# Patient Record
Sex: Male | Born: 1955 | Race: White | Hispanic: No | Marital: Married | Smoking: Former smoker
Health system: Southern US, Community
[De-identification: ages and names within clinical notes are randomized; demographics above are authoritative.]

## PROBLEM LIST (undated history)

## (undated) DIAGNOSIS — Z87442 Personal history of urinary calculi: Secondary | ICD-10-CM

## (undated) DIAGNOSIS — R011 Cardiac murmur, unspecified: Secondary | ICD-10-CM

## (undated) DIAGNOSIS — N189 Chronic kidney disease, unspecified: Secondary | ICD-10-CM

## (undated) DIAGNOSIS — G600 Hereditary motor and sensory neuropathy: Secondary | ICD-10-CM

## (undated) DIAGNOSIS — I714 Abdominal aortic aneurysm, without rupture, unspecified: Secondary | ICD-10-CM

## (undated) DIAGNOSIS — R195 Other fecal abnormalities: Secondary | ICD-10-CM

## (undated) DIAGNOSIS — R0600 Dyspnea, unspecified: Secondary | ICD-10-CM

## (undated) DIAGNOSIS — M1990 Unspecified osteoarthritis, unspecified site: Secondary | ICD-10-CM

## (undated) DIAGNOSIS — J189 Pneumonia, unspecified organism: Secondary | ICD-10-CM

## (undated) DIAGNOSIS — I10 Essential (primary) hypertension: Secondary | ICD-10-CM

## (undated) DIAGNOSIS — C44212 Basal cell carcinoma of skin of right ear and external auricular canal: Secondary | ICD-10-CM

## (undated) DIAGNOSIS — F419 Anxiety disorder, unspecified: Secondary | ICD-10-CM

## (undated) DIAGNOSIS — G629 Polyneuropathy, unspecified: Secondary | ICD-10-CM

## (undated) HISTORY — DX: Polyneuropathy, unspecified: G62.9

## (undated) HISTORY — DX: Hereditary motor and sensory neuropathy: G60.0

## (undated) HISTORY — PX: OTHER SURGICAL HISTORY: SHX169

## (undated) HISTORY — PX: TONSILLECTOMY: SUR1361

## (undated) HISTORY — DX: Other fecal abnormalities: R19.5

## (undated) HISTORY — PX: MOHS SURGERY: SUR867

## (undated) HISTORY — PX: LUNG BIOPSY: SHX232

## (undated) HISTORY — DX: Basal cell carcinoma of skin of right ear and external auricular canal: C44.212

---

## 2005-05-28 ENCOUNTER — Encounter: Admission: RE | Admit: 2005-05-28 | Discharge: 2005-05-28 | Payer: Self-pay | Admitting: Orthopedic Surgery

## 2006-01-23 ENCOUNTER — Encounter: Admission: RE | Admit: 2006-01-23 | Discharge: 2006-01-23 | Payer: Self-pay | Admitting: Neurology

## 2007-07-12 ENCOUNTER — Encounter
Admission: RE | Admit: 2007-07-12 | Discharge: 2007-07-13 | Payer: Self-pay | Admitting: Physical Medicine & Rehabilitation

## 2007-07-13 ENCOUNTER — Ambulatory Visit: Payer: Self-pay | Admitting: Physical Medicine & Rehabilitation

## 2008-09-27 ENCOUNTER — Ambulatory Visit (HOSPITAL_COMMUNITY): Admission: RE | Admit: 2008-09-27 | Discharge: 2008-09-27 | Payer: Self-pay | Admitting: Neurology

## 2008-10-25 ENCOUNTER — Ambulatory Visit: Payer: Self-pay

## 2008-10-25 ENCOUNTER — Encounter (INDEPENDENT_AMBULATORY_CARE_PROVIDER_SITE_OTHER): Payer: Self-pay | Admitting: Neurology

## 2008-10-25 ENCOUNTER — Encounter: Payer: Self-pay | Admitting: Cardiology

## 2008-12-11 ENCOUNTER — Encounter: Admission: RE | Admit: 2008-12-11 | Discharge: 2008-12-11 | Payer: Self-pay | Admitting: Internal Medicine

## 2008-12-20 ENCOUNTER — Telehealth: Payer: Self-pay | Admitting: Cardiology

## 2010-03-04 ENCOUNTER — Encounter: Payer: Self-pay | Admitting: Neurology

## 2010-06-25 NOTE — Group Therapy Note (Signed)
REQUESTING PHYSICIAN:  Kirstie Peri, MD   REASON FOR CONSULTATION:  Evaluation of other pain management treatment  options.   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  He is a rather poor historian, tends to  ramble and really has no records with him regarding the initial injury  which he sustained on November 13, 2004.  He had a large tree limb fall on  him, fracturing his left ankle.  He states that he had a trifracture,  and he was not sure whether it was trimalleolar of his right ankle but  indicated a significant deformity with his ankle deviated out laterally.  He did not seek immediate medical help but went about four hours later.  He underwent open reduction internal fixation of the right ankle.  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  He states he has been wearing an AFO  since 07.   He also gives a history of a peripheral neuropathy diagnosed by Dr.  Sandria Manly.  He gives a family history positive for a neuropathy in his  brother which he states is Charcot-Marie-Tooth but also affects his  nerve to the lung, i.e., phrenic nerve, and he is wheelchair bound which  is unusual for Charcot-Marie-Tooth.   Additional diagnosis that he states is considered in his case per Dr.  Sandria Manly was sarcoidosis but once again, I do not have any further  information on this.  He has been sent to a balance program at [facility] per Dr. Sandria Manly and has had improvement in his balance but does  not always use his cane.  He describes his pain as both a neuropathy  pain in the right greater than left foot as well as some sensitivity  __________ on the right lateral malleolus scar.  His pain is in the 1/10  range currently but usually 2 to  3/10 with medication.  Pain improves  with activity at a moderate level.  Sleep is poor.  Pain improves with  rest, medications, relief from meds is 9 to 10/10.  He can walk 10 to 15  minutes at a time, climbs steps and drives.  He was last employed August  of 2007.  He states he has been on disability since September of 2007.   He does indicate that his other doctors have filled out his long-term  disability form stating that he is incapable of minimal activity.   REVIEW OF SYSTEMS:  Positive for numbness, tremor, tingling, right  greater than left leg, trouble walking, spasms, dizziness, confusion,  depression, anxiety.  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   SOCIAL HISTORY:  Married.  Lives with his wife who works.   FAMILY HISTORY:  Heart disease, high blood pressure, diabetes.   He has had bone scans through the [facility] system dated May 28, 2005, showing some uptake distal right tibial shaft, not felt to be  metastatic lesions, some posttraumatic uptake to left posterior ribs and  left ankle, right popliteal ultrasound was negative.  EXAMINATION:  Blood pressure 144/97, pulse 76, respirations 18, O2 sat  98% on room air.  Orientation x3.  Affect is flat.  He talks slowly and switches subjects,  talking about different doctors that he has seen in no particular order  and changing between the symptoms in his right leg and his left leg.  On observation, he does have a high-arched foot on the left side but not  on the right side.  He does have right lateral malleolar scar that is  hypersensitive to touch.  He is a mildly atrophic right gastroc compared  to left side.  His quad looks equal bilaterally.  His upper extremity  muscle  bulk is equal.  He has no evidence of intrinsic atrophy in the  upper or lower extremities.  He has  no evidence of fasciculations.  His  neck range of motion is normal.  His upper extremity strength is 5/5  bilateral deltoid __________  grip.  His sensation is normal to pinprick  and proprioception in the upper extremity and reduced in the right lower  extremity.  The right L4, S1 and L5 dermatomes.  This is to pinprick.  He also has decreased proprioception in the toes of the right foot  compared to the left foot.  Curiously, he is able to go up on his toes  as well as up on his heels but has difficulty with ankle dorsiflexion  against manual resistance.  He has normal pulses bilateral lower  extremities.  He has hypersensitivity to touch over the scar on the  right lower extremity but not over the remainder of the foot.   His EHL strength is normal.  His knee extensor strength is normal.  Left  lower extremity strength is normal.  Hip, knee range of motion is  normal.  He does have mildly diminished ankle dorsiflexion and plantar  flexion on the right side compared to the left side.   IMPRESSION:  1. Chronic posttraumatic pain right lower extremity.  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  No hyperhidrosis.  His only area of      hyperalgesia, and in fact allodynia, is over the scar itself on the      right side, question of whether he may have some sural nerve      injury.   He actually has quite good strength.  I am not sure why he needs the AFO  other than it may provide some additional proprioception for him because  he does lack proprioception in the right foot which contributes to his  balance disorder.   With regards to his overall disability, I think that if he was able to  reduce his narcotic analgesic dosages, he should be able to resume a  sedentary type of position.  I think his main limitation is cognitive at  this point with  poor ability to concentrate and overall drowsiness due  to narcotic analgesics.   We discussed his treatment options and I feel that I do not have the  whole picture.  I would like to see his EMG evaluations, his notes from  Dr. Sandria Manly.  He could potentially benefit from the posterior tibial nerve  block as well as some topical agents such as Lidoderm patch over lateral  foot  plus minus Flector Patch or Voltaren gel.   Also may benefit from adjuvant atypical anticonvulsants.  I think he has  been trialed on Neurontin in the past, although he could not tell me any  dosages.   He will continue to get his pain medicines through Dr. Vernon Prey  office.  I have not assumed his care, and he has not signed a controlled  substance agreement with [place] at this point.  He is currently on Kadian  b.i.d. in addition to clonazepam 0.5 q.i.d.  Given his anxiety problems,  he may benefit from Cymbalta.   I will see him back in about a month's time.  Hopefully, he will be able  to get me additional records in that time.  He is going through some  physical therapy as well.      Erick Colace, M.D.  Electronically Signed     AEK/MedQ  D:  07/13/2007 16:25:56  T:  07/13/2007 18:47:12  Job #:  [id number]   cc:   Kirstie Peri, MD  Fax: [phone]   Annabelle Harman, M.D.   Genene Churn. Love, M.D.  Fax: [phone]

## 2010-08-29 ENCOUNTER — Ambulatory Visit (INDEPENDENT_AMBULATORY_CARE_PROVIDER_SITE_OTHER): Payer: Medicare Other | Admitting: Psychology

## 2010-08-29 DIAGNOSIS — F411 Generalized anxiety disorder: Secondary | ICD-10-CM

## 2010-09-19 ENCOUNTER — Encounter (HOSPITAL_COMMUNITY): Payer: MEDICARE | Admitting: Psychology

## 2010-09-26 ENCOUNTER — Encounter (INDEPENDENT_AMBULATORY_CARE_PROVIDER_SITE_OTHER): Payer: MEDICARE | Admitting: Psychology

## 2010-09-26 DIAGNOSIS — F411 Generalized anxiety disorder: Secondary | ICD-10-CM

## 2010-10-24 ENCOUNTER — Encounter (HOSPITAL_COMMUNITY): Payer: MEDICARE | Admitting: Psychology

## 2016-02-20 DIAGNOSIS — Z79891 Long term (current) use of opiate analgesic: Secondary | ICD-10-CM | POA: Diagnosis not present

## 2016-02-20 DIAGNOSIS — Z79899 Other long term (current) drug therapy: Secondary | ICD-10-CM | POA: Diagnosis not present

## 2016-02-20 DIAGNOSIS — M545 Low back pain: Secondary | ICD-10-CM | POA: Diagnosis not present

## 2016-02-20 DIAGNOSIS — M25569 Pain in unspecified knee: Secondary | ICD-10-CM | POA: Diagnosis not present

## 2016-02-20 DIAGNOSIS — G894 Chronic pain syndrome: Secondary | ICD-10-CM | POA: Diagnosis not present

## 2016-02-20 DIAGNOSIS — M79609 Pain in unspecified limb: Secondary | ICD-10-CM | POA: Diagnosis not present

## 2016-03-19 DIAGNOSIS — M25569 Pain in unspecified knee: Secondary | ICD-10-CM | POA: Diagnosis not present

## 2016-03-19 DIAGNOSIS — M79609 Pain in unspecified limb: Secondary | ICD-10-CM | POA: Diagnosis not present

## 2016-03-19 DIAGNOSIS — Z79891 Long term (current) use of opiate analgesic: Secondary | ICD-10-CM | POA: Diagnosis not present

## 2016-03-19 DIAGNOSIS — Z79899 Other long term (current) drug therapy: Secondary | ICD-10-CM | POA: Diagnosis not present

## 2016-03-19 DIAGNOSIS — M545 Low back pain: Secondary | ICD-10-CM | POA: Diagnosis not present

## 2016-03-19 DIAGNOSIS — G894 Chronic pain syndrome: Secondary | ICD-10-CM | POA: Diagnosis not present

## 2016-04-16 DIAGNOSIS — Z79899 Other long term (current) drug therapy: Secondary | ICD-10-CM | POA: Diagnosis not present

## 2016-04-16 DIAGNOSIS — G894 Chronic pain syndrome: Secondary | ICD-10-CM | POA: Diagnosis not present

## 2016-04-16 DIAGNOSIS — M545 Low back pain: Secondary | ICD-10-CM | POA: Diagnosis not present

## 2016-04-16 DIAGNOSIS — M25569 Pain in unspecified knee: Secondary | ICD-10-CM | POA: Diagnosis not present

## 2016-04-16 DIAGNOSIS — Z79891 Long term (current) use of opiate analgesic: Secondary | ICD-10-CM | POA: Diagnosis not present

## 2016-04-16 DIAGNOSIS — M79609 Pain in unspecified limb: Secondary | ICD-10-CM | POA: Diagnosis not present

## 2016-05-13 DIAGNOSIS — M25579 Pain in unspecified ankle and joints of unspecified foot: Secondary | ICD-10-CM | POA: Diagnosis not present

## 2016-05-13 DIAGNOSIS — G629 Polyneuropathy, unspecified: Secondary | ICD-10-CM | POA: Diagnosis not present

## 2016-05-13 DIAGNOSIS — Z79899 Other long term (current) drug therapy: Secondary | ICD-10-CM | POA: Diagnosis not present

## 2016-05-13 DIAGNOSIS — G894 Chronic pain syndrome: Secondary | ICD-10-CM | POA: Diagnosis not present

## 2016-05-13 DIAGNOSIS — Z79891 Long term (current) use of opiate analgesic: Secondary | ICD-10-CM | POA: Diagnosis not present

## 2016-05-13 DIAGNOSIS — G600 Hereditary motor and sensory neuropathy: Secondary | ICD-10-CM | POA: Diagnosis not present

## 2016-06-10 DIAGNOSIS — G600 Hereditary motor and sensory neuropathy: Secondary | ICD-10-CM | POA: Diagnosis not present

## 2016-06-10 DIAGNOSIS — G629 Polyneuropathy, unspecified: Secondary | ICD-10-CM | POA: Diagnosis not present

## 2016-06-10 DIAGNOSIS — Z79891 Long term (current) use of opiate analgesic: Secondary | ICD-10-CM | POA: Diagnosis not present

## 2016-06-10 DIAGNOSIS — M25579 Pain in unspecified ankle and joints of unspecified foot: Secondary | ICD-10-CM | POA: Diagnosis not present

## 2016-06-10 DIAGNOSIS — G894 Chronic pain syndrome: Secondary | ICD-10-CM | POA: Diagnosis not present

## 2016-06-10 DIAGNOSIS — Z79899 Other long term (current) drug therapy: Secondary | ICD-10-CM | POA: Diagnosis not present

## 2016-07-08 DIAGNOSIS — G894 Chronic pain syndrome: Secondary | ICD-10-CM | POA: Diagnosis not present

## 2016-07-08 DIAGNOSIS — M25579 Pain in unspecified ankle and joints of unspecified foot: Secondary | ICD-10-CM | POA: Diagnosis not present

## 2016-07-08 DIAGNOSIS — G629 Polyneuropathy, unspecified: Secondary | ICD-10-CM | POA: Diagnosis not present

## 2016-07-08 DIAGNOSIS — Z79899 Other long term (current) drug therapy: Secondary | ICD-10-CM | POA: Diagnosis not present

## 2016-07-08 DIAGNOSIS — G600 Hereditary motor and sensory neuropathy: Secondary | ICD-10-CM | POA: Diagnosis not present

## 2016-07-08 DIAGNOSIS — Z79891 Long term (current) use of opiate analgesic: Secondary | ICD-10-CM | POA: Diagnosis not present

## 2016-08-05 DIAGNOSIS — M25579 Pain in unspecified ankle and joints of unspecified foot: Secondary | ICD-10-CM | POA: Diagnosis not present

## 2016-08-05 DIAGNOSIS — G629 Polyneuropathy, unspecified: Secondary | ICD-10-CM | POA: Diagnosis not present

## 2016-08-05 DIAGNOSIS — G894 Chronic pain syndrome: Secondary | ICD-10-CM | POA: Diagnosis not present

## 2016-08-05 DIAGNOSIS — G600 Hereditary motor and sensory neuropathy: Secondary | ICD-10-CM | POA: Diagnosis not present

## 2016-08-21 DIAGNOSIS — L0202 Furuncle of face: Secondary | ICD-10-CM | POA: Diagnosis not present

## 2016-08-21 DIAGNOSIS — L570 Actinic keratosis: Secondary | ICD-10-CM | POA: Diagnosis not present

## 2016-08-21 DIAGNOSIS — L218 Other seborrheic dermatitis: Secondary | ICD-10-CM | POA: Diagnosis not present

## 2016-08-21 DIAGNOSIS — X32XXXD Exposure to sunlight, subsequent encounter: Secondary | ICD-10-CM | POA: Diagnosis not present

## 2016-08-21 DIAGNOSIS — I872 Venous insufficiency (chronic) (peripheral): Secondary | ICD-10-CM | POA: Diagnosis not present

## 2016-09-02 DIAGNOSIS — M25579 Pain in unspecified ankle and joints of unspecified foot: Secondary | ICD-10-CM | POA: Diagnosis not present

## 2016-09-02 DIAGNOSIS — G894 Chronic pain syndrome: Secondary | ICD-10-CM | POA: Diagnosis not present

## 2016-09-02 DIAGNOSIS — G629 Polyneuropathy, unspecified: Secondary | ICD-10-CM | POA: Diagnosis not present

## 2016-09-02 DIAGNOSIS — G600 Hereditary motor and sensory neuropathy: Secondary | ICD-10-CM | POA: Diagnosis not present

## 2016-09-05 DIAGNOSIS — R5383 Other fatigue: Secondary | ICD-10-CM | POA: Diagnosis not present

## 2016-09-05 DIAGNOSIS — Z125 Encounter for screening for malignant neoplasm of prostate: Secondary | ICD-10-CM | POA: Diagnosis not present

## 2016-09-05 DIAGNOSIS — I10 Essential (primary) hypertension: Secondary | ICD-10-CM | POA: Diagnosis not present

## 2016-09-05 DIAGNOSIS — Z0000 Encounter for general adult medical examination without abnormal findings: Secondary | ICD-10-CM | POA: Diagnosis not present

## 2016-09-05 DIAGNOSIS — R69 Illness, unspecified: Secondary | ICD-10-CM | POA: Diagnosis not present

## 2016-09-05 DIAGNOSIS — Z1211 Encounter for screening for malignant neoplasm of colon: Secondary | ICD-10-CM | POA: Diagnosis not present

## 2016-09-05 DIAGNOSIS — Z6825 Body mass index (BMI) 25.0-25.9, adult: Secondary | ICD-10-CM | POA: Diagnosis not present

## 2016-09-05 DIAGNOSIS — Z79899 Other long term (current) drug therapy: Secondary | ICD-10-CM | POA: Diagnosis not present

## 2016-09-05 DIAGNOSIS — Z1389 Encounter for screening for other disorder: Secondary | ICD-10-CM | POA: Diagnosis not present

## 2016-09-05 DIAGNOSIS — Z299 Encounter for prophylactic measures, unspecified: Secondary | ICD-10-CM | POA: Diagnosis not present

## 2016-09-05 DIAGNOSIS — Z7189 Other specified counseling: Secondary | ICD-10-CM | POA: Diagnosis not present

## 2016-09-25 LAB — IFOBT (OCCULT BLOOD): IMMUNOLOGICAL FECAL OCCULT BLOOD TEST: POSITIVE

## 2016-09-30 DIAGNOSIS — G600 Hereditary motor and sensory neuropathy: Secondary | ICD-10-CM | POA: Diagnosis not present

## 2016-09-30 DIAGNOSIS — Z79899 Other long term (current) drug therapy: Secondary | ICD-10-CM | POA: Diagnosis not present

## 2016-09-30 DIAGNOSIS — G629 Polyneuropathy, unspecified: Secondary | ICD-10-CM | POA: Diagnosis not present

## 2016-09-30 DIAGNOSIS — Z79891 Long term (current) use of opiate analgesic: Secondary | ICD-10-CM | POA: Diagnosis not present

## 2016-09-30 DIAGNOSIS — M25579 Pain in unspecified ankle and joints of unspecified foot: Secondary | ICD-10-CM | POA: Diagnosis not present

## 2016-09-30 DIAGNOSIS — G894 Chronic pain syndrome: Secondary | ICD-10-CM | POA: Diagnosis not present

## 2016-10-01 ENCOUNTER — Encounter (INDEPENDENT_AMBULATORY_CARE_PROVIDER_SITE_OTHER): Payer: Self-pay

## 2016-10-01 ENCOUNTER — Encounter (INDEPENDENT_AMBULATORY_CARE_PROVIDER_SITE_OTHER): Payer: Self-pay | Admitting: Internal Medicine

## 2016-10-15 DIAGNOSIS — L0390 Cellulitis, unspecified: Secondary | ICD-10-CM | POA: Diagnosis not present

## 2016-10-15 DIAGNOSIS — Z789 Other specified health status: Secondary | ICD-10-CM | POA: Diagnosis not present

## 2016-10-15 DIAGNOSIS — Z6826 Body mass index (BMI) 26.0-26.9, adult: Secondary | ICD-10-CM | POA: Diagnosis not present

## 2016-10-15 DIAGNOSIS — I10 Essential (primary) hypertension: Secondary | ICD-10-CM | POA: Diagnosis not present

## 2016-10-15 DIAGNOSIS — Z299 Encounter for prophylactic measures, unspecified: Secondary | ICD-10-CM | POA: Diagnosis not present

## 2016-10-27 ENCOUNTER — Ambulatory Visit (INDEPENDENT_AMBULATORY_CARE_PROVIDER_SITE_OTHER): Payer: Self-pay | Admitting: Internal Medicine

## 2016-10-28 DIAGNOSIS — G629 Polyneuropathy, unspecified: Secondary | ICD-10-CM | POA: Diagnosis not present

## 2016-10-28 DIAGNOSIS — M25579 Pain in unspecified ankle and joints of unspecified foot: Secondary | ICD-10-CM | POA: Diagnosis not present

## 2016-10-28 DIAGNOSIS — G600 Hereditary motor and sensory neuropathy: Secondary | ICD-10-CM | POA: Diagnosis not present

## 2016-10-28 DIAGNOSIS — G894 Chronic pain syndrome: Secondary | ICD-10-CM | POA: Diagnosis not present

## 2016-10-28 DIAGNOSIS — Z79899 Other long term (current) drug therapy: Secondary | ICD-10-CM | POA: Diagnosis not present

## 2016-10-28 DIAGNOSIS — Z79891 Long term (current) use of opiate analgesic: Secondary | ICD-10-CM | POA: Diagnosis not present

## 2016-10-29 ENCOUNTER — Ambulatory Visit (INDEPENDENT_AMBULATORY_CARE_PROVIDER_SITE_OTHER): Payer: Medicare HMO | Admitting: Internal Medicine

## 2016-10-29 ENCOUNTER — Encounter (INDEPENDENT_AMBULATORY_CARE_PROVIDER_SITE_OTHER): Payer: Self-pay | Admitting: Internal Medicine

## 2016-10-29 DIAGNOSIS — G600 Hereditary motor and sensory neuropathy: Secondary | ICD-10-CM | POA: Insufficient documentation

## 2016-10-29 DIAGNOSIS — R195 Other fecal abnormalities: Secondary | ICD-10-CM

## 2016-10-29 DIAGNOSIS — G629 Polyneuropathy, unspecified: Secondary | ICD-10-CM

## 2016-10-29 HISTORY — DX: Hereditary motor and sensory neuropathy: G60.0

## 2016-10-29 HISTORY — DX: Polyneuropathy, unspecified: G62.9

## 2016-10-29 HISTORY — DX: Other fecal abnormalities: R19.5

## 2016-10-29 NOTE — Progress Notes (Signed)
   Subjective:    Patient ID: Juan Yang, male    DOB: 1955/05/31, 61 y.o.   MRN: [id number]   He h   Referred by Dr. Manuella Ghazi for + stool card. He states he has not seen any change in his stools. He has hx of constipation. He takes OTC stool softeners for his constipation. Has tried Movantik which worked great but was very expensive.  He has a BM every 3 days and sometimes longer. He occasionally has to strain.  Appetite is good. No weight loss. No abdominal pain.  He had a virtual colonoscopy in 2010.   IMPRESSION: The wall of the sigmoid colon may be mildly thickened diffusely. No stricture, polyp or mass. IMPRESSION: No acute findings in the pelvis. IMPRESSION: No acute findings in the pelvis.  09/07/2016 H and H  14.5 and 42.9  Review of Systems Past Medical History:  Diagnosis Date  . 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 Guaiac + stool 10/29/2016  . Peripheral neuropathy 10/29/2016    Past Surgical History:  Procedure Laterality Date  . rt ankle surgery     Titanium hinge  . TONSILLECTOMY      Allergies  Allergen Reactions  . Ace Inhibitors   . Atenolol     Nausea and diarrhea   . Cymbalta [Duloxetine Hcl]     zomby  . Darvon [Propoxyphene]   . Penicillins     hives    No current outpatient prescriptions on file prior to visit.   No current facility-administered medications on file prior to visit.    Current Outpatient Prescriptions  Medication Sig Dispense Refill  . Baclofen 5 MG TABS Take 10 mg by mouth 2 (two) times daily.    Marland Kitchen gabapentin (NEURONTIN) 300 MG capsule Take 300 mg by mouth 3 (three) times daily.    . Morphine-Naltrexone (EMBEDA) 60-2.4 MG CPCR Take by mouth 2 (two) times daily.     No current facility-administered medications for this visit.         Objective:   Physical Exam Blood pressure 110/80, pulse 72, temperature 98 F (36.7 C), height 6' (1.829 m), weight 193 lb 3.2 oz (87.6 kg). Alert and oriented. Skin warm  and dry. Oral mucosa is moist.   . Sclera anicteric, conjunctivae is pink. Thyroid not enlarged. No cervical lymphadenopathy. Lungs clear. Heart regular rate and rhythm.  Abdomen is soft. Bowel sounds are positive. No hepatomegaly. No abdominal masses felt. No tenderness.  No edema to lower extremities.  .Rectal exam: no masses felt. Large hard stool noted in rectum. Guaiac negative.         Assessment & Plan:  + stool card.. Colonic neoplasm needs to be ruled out.  Polyp, AVM, ulcer also in the differential.  Samples of Linzess 273mcg x 7 boxes given to patient.

## 2016-10-29 NOTE — Patient Instructions (Addendum)
Colonoscopy.  The risks of bleeding, perforation and infection were reviewed with patient. Samples of LInzess given to patient

## 2016-10-30 ENCOUNTER — Other Ambulatory Visit: Payer: Self-pay | Admitting: Pain Medicine

## 2016-10-30 ENCOUNTER — Encounter (INDEPENDENT_AMBULATORY_CARE_PROVIDER_SITE_OTHER): Payer: Self-pay | Admitting: *Deleted

## 2016-10-30 ENCOUNTER — Telehealth (INDEPENDENT_AMBULATORY_CARE_PROVIDER_SITE_OTHER): Payer: Self-pay | Admitting: *Deleted

## 2016-10-30 ENCOUNTER — Other Ambulatory Visit (INDEPENDENT_AMBULATORY_CARE_PROVIDER_SITE_OTHER): Payer: Self-pay | Admitting: Internal Medicine

## 2016-10-30 DIAGNOSIS — R69 Illness, unspecified: Secondary | ICD-10-CM | POA: Diagnosis not present

## 2016-10-30 DIAGNOSIS — M545 Low back pain: Secondary | ICD-10-CM

## 2016-10-30 DIAGNOSIS — R195 Other fecal abnormalities: Secondary | ICD-10-CM

## 2016-10-30 MED ORDER — PEG 3350-KCL-NA BICARB-NACL 420 G PO SOLR: 4000.0000 mL | Freq: Once | ORAL | 0 refills | Status: AC

## 2016-10-30 NOTE — Telephone Encounter (Signed)
Patient needs trilyte 

## 2016-11-10 NOTE — Patient Instructions (Signed)
Juan Yang  11/10/2016     @PREFPERIOPPHARMACY @   Your procedure is scheduled on  11/14/2016   Report to [facility] at  745  A.M.  Call this number if you have problems the morning of surgery:  [phone]   Remember:  Do not eat food or drink liquids after midnight.  Take these medicines the morning of surgery with A SIP OF WATER  Neurontin, nycynta.   Do not wear jewelry, make-up or nail polish.  Do not wear lotions, powders, or perfumes, or deoderant.  Do not shave 48 hours prior to surgery.  Men may shave face and neck.  Do not bring valuables to the hospital.  [facility] is not responsible for any belongings or valuables.  Contacts, dentures or bridgework may not be worn into surgery.  Leave your suitcase in the car.  After surgery it may be brought to your room.  For patients admitted to the hospital, discharge time will be determined by your treatment team.  Patients discharged the day of surgery will not be allowed to drive home.   Name and phone number of your driver:   family Special instructions:  Follow the diet and prep instructions given to you by Dr Olevia Perches office.  Please read over the following fact sheets that you were given. Anesthesia Post-op Instructions and Care and Recovery After Surgery       Colonoscopy, Adult A colonoscopy is an exam to look at the entire large intestine. During the exam, a lubricated, bendable tube is inserted into the anus and then passed into the rectum, colon, and other parts of the large intestine. A colonoscopy is often done as a part of normal colorectal screening or in response to certain symptoms, such as anemia, persistent diarrhea, abdominal pain, and blood in the stool. The exam can help screen for and diagnose medical problems, including:  Tumors.  Polyps.  Inflammation.  Areas of bleeding.  Tell a health care provider about:  Any allergies you have.  All medicines you are taking,  including vitamins, herbs, eye drops, creams, and over-the-counter medicines.  Any problems you or family members have had with anesthetic medicines.  Any blood disorders you have.  Any surgeries you have had.  Any medical conditions you have.  Any problems you have had passing stool. What are the risks? Generally, this is a safe procedure. However, problems may occur, including:  Bleeding.  A tear in the intestine.  A reaction to medicines given during the exam.  Infection (rare).  What happens before the procedure? Eating and drinking restrictions Follow instructions from your health care provider about eating and drinking, which may include:  A few days before the procedure - follow a low-fiber diet. Avoid nuts, seeds, dried fruit, raw fruits, and vegetables.  1-3 days before the procedure - follow a clear liquid diet. Drink only clear liquids, such as clear broth or bouillon, black coffee or tea, clear juice, clear soft drinks or sports drinks, gelatin dessert, and popsicles. Avoid any liquids that contain red or purple dye.  On the day of the procedure - do not eat or drink anything during the 2 hours before the procedure, or within the time period that your health care provider recommends.  Bowel prep If you were prescribed an oral bowel prep to clean out your colon:  Take it as told by your health care provider. Starting the day before your procedure, you  will need to drink a large amount of medicated liquid. The liquid will cause you to have multiple loose stools until your stool is almost clear or light green.  If your skin or anus gets irritated from diarrhea, you may use these to relieve the irritation: ? Medicated wipes, such as adult wet wipes with aloe and vitamin E. ? A skin soothing-product like petroleum jelly.  If you vomit while drinking the bowel prep, take a break for up to 60 minutes and then begin the bowel prep again. If vomiting continues and you  cannot take the bowel prep without vomiting, call your health care provider.  General instructions  Ask your health care provider about changing or stopping your regular medicines. This is especially important if you are taking diabetes medicines or blood thinners.  Plan to have someone take you home from the hospital or clinic. What happens during the procedure?  An IV tube may be inserted into one of your veins.  You will be given medicine to help you relax (sedative).  To reduce your risk of infection: ? Your health care team will wash or sanitize their hands. ? Your anal area will be washed with soap.  You will be asked to lie on your side with your knees bent.  Your health care provider will lubricate a long, thin, flexible tube. The tube will have a camera and a light on the end.  The tube will be inserted into your anus.  The tube will be gently eased through your rectum and colon.  Air will be delivered into your colon to keep it open. You may feel some pressure or cramping.  The camera will be used to take images during the procedure.  A small tissue sample may be removed from your body to be examined under a microscope (biopsy). If any potential problems are found, the tissue will be sent to a lab for testing.  If small polyps are found, your health care provider may remove them and have them checked for cancer cells.  The tube that was inserted into your anus will be slowly removed. The procedure may vary among health care providers and hospitals. What happens after the procedure?  Your blood pressure, heart rate, breathing rate, and blood oxygen level will be monitored until the medicines you were given have worn off.  Do not drive for 24 hours after the exam.  You may have a small amount of blood in your stool.  You may pass gas and have mild abdominal cramping or bloating due to the air that was used to inflate your colon during the exam.  It is up to you to  get the results of your procedure. Ask your health care provider, or the department performing the procedure, when your results will be ready. This information is not intended to replace advice given to you by your health care provider. Make sure you discuss any questions you have with your health care provider. Document Released: 01/25/2000 Document Revised: 11/28/2015 Document Reviewed: 04/10/2015 Elsevier Interactive Patient Education  2018 [employer].  Colonoscopy, Adult, Care After This sheet gives you information about how to care for yourself after your procedure. Your health care provider may also give you more specific instructions. If you have problems or questions, contact your health care provider. What can I expect after the procedure? After the procedure, it is common to have:  A small amount of blood in your stool for 24 hours after the procedure.  Some  gas.  Mild abdominal cramping or bloating.  Follow these instructions at home: General instructions   For the first 24 hours after the procedure: ? Do not drive or use machinery. ? Do not sign important documents. ? Do not drink alcohol. ? Do your regular daily activities at a slower pace than normal. ? Eat soft, easy-to-digest foods. ? Rest often.  Take over-the-counter or prescription medicines only as told by your health care provider.  It is up to you to get the results of your procedure. Ask your health care provider, or the department performing the procedure, when your results will be ready. Relieving cramping and bloating  Try walking around when you have cramps or feel bloated.  Apply heat to your abdomen as told by your health care provider. Use a heat source that your health care provider recommends, such as a moist heat pack or a heating pad. ? Place a towel between your skin and the heat source. ? Leave the heat on for 20-30 minutes. ? Remove the heat if your skin turns bright red. This is  especially important if you are unable to feel pain, heat, or cold. You may have a greater risk of getting burned. Eating and drinking  Drink enough fluid to keep your urine clear or pale yellow.  Resume your normal diet as instructed by your health care provider. Avoid heavy or fried foods that are hard to digest.  Avoid drinking alcohol for as long as instructed by your health care provider. Contact a health care provider if:  You have blood in your stool 2-3 days after the procedure. Get help right away if:  You have more than a small spotting of blood in your stool.  You pass large blood clots in your stool.  Your abdomen is swollen.  You have nausea or vomiting.  You have a fever.  You have increasing abdominal pain that is not relieved with medicine. This information is not intended to replace advice given to you by your health care provider. Make sure you discuss any questions you have with your health care provider. Document Released: 09/11/2003 Document Revised: 10/22/2015 Document Reviewed: 04/10/2015 Elsevier Interactive Patient Education  2018 [place] Anesthesia is a term that refers to techniques, procedures, and medicines that help a person stay safe and comfortable during a medical procedure. Monitored anesthesia care, or sedation, is one type of anesthesia. Your anesthesia specialist may recommend sedation if you will be having a procedure that does not require you to be unconscious, such as:  Cataract surgery.  A dental procedure.  A biopsy.  A colonoscopy.  During the procedure, you may receive a medicine to help you relax (sedative). There are three levels of sedation:  Mild sedation. At this level, you may feel awake and relaxed. You will be able to follow directions.  Moderate sedation. At this level, you will be sleepy. You may not remember the procedure.  Deep sedation. At this level, you will be asleep. You will  not remember the procedure.  The more medicine you are given, the deeper your level of sedation will be. Depending on how you respond to the procedure, the anesthesia specialist may change your level of sedation or the type of anesthesia to fit your needs. An anesthesia specialist will monitor you closely during the procedure. Let your health care provider know about:  Any allergies you have.  All medicines you are taking, including vitamins, herbs, eye drops, creams, and over-the-counter  medicines.  Any use of steroids (by mouth or as a cream).  Any problems you or family members have had with sedatives and anesthetic medicines.  Any blood disorders you have.  Any surgeries you have had.  Any medical conditions you have, such as sleep apnea.  Whether you are pregnant or may be pregnant.  Any use of cigarettes, alcohol, or street drugs. What are the risks? Generally, this is a safe procedure. However, problems may occur, including:  Getting too much medicine (oversedation).  Nausea.  Allergic reaction to medicines.  Trouble breathing. If this happens, a breathing tube may be used to help with breathing. It will be removed when you are awake and breathing on your own.  Heart trouble.  Lung trouble.  Before the procedure Staying hydrated Follow instructions from your health care provider about hydration, which may include:  Up to 2 hours before the procedure - you may continue to drink clear liquids, such as water, clear fruit juice, black coffee, and plain tea.  Eating and drinking restrictions Follow instructions from your health care provider about eating and drinking, which may include:  8 hours before the procedure - stop eating heavy meals or foods such as meat, fried foods, or fatty foods.  6 hours before the procedure - stop eating light meals or foods, such as toast or cereal.  6 hours before the procedure - stop drinking milk or drinks that contain milk.  2  hours before the procedure - stop drinking clear liquids.  Medicines Ask your health care provider about:  Changing or stopping your regular medicines. This is especially important if you are taking diabetes medicines or blood thinners.  Taking medicines such as aspirin and ibuprofen. These medicines can thin your blood. Do not take these medicines before your procedure if your health care provider instructs you not to.  Tests and exams  You will have a physical exam.  You may have blood tests done to show: ? How well your kidneys and liver are working. ? How well your blood can clot.  General instructions  Plan to have someone take you home from the hospital or clinic.  If you will be going home right after the procedure, plan to have someone with you for 24 hours.  What happens during the procedure?  Your blood pressure, heart rate, breathing, level of pain and overall condition will be monitored.  An IV tube will be inserted into one of your veins.  Your anesthesia specialist will give you medicines as needed to keep you comfortable during the procedure. This may mean changing the level of sedation.  The procedure will be performed. After the procedure  Your blood pressure, heart rate, breathing rate, and blood oxygen level will be monitored until the medicines you were given have worn off.  Do not drive for 24 hours if you received a sedative.  You may: ? Feel sleepy, clumsy, or nauseous. ? Feel forgetful about what happened after the procedure. ? Have a sore throat if you had a breathing tube during the procedure. ? Vomit. This information is not intended to replace advice given to you by your health care provider. Make sure you discuss any questions you have with your health care provider. Document Released: 10/23/2004 Document Revised: 07/06/2015 Document Reviewed: 05/20/2015 Elsevier Interactive Patient Education  2018 [place],  Care After These instructions provide you with information about caring for yourself after your procedure. Your health care provider may also give  you more specific instructions. Your treatment has been planned according to current medical practices, but problems sometimes occur. Call your health care provider if you have any problems or questions after your procedure. What can I expect after the procedure? After your procedure, it is common to:  Feel sleepy for several hours.  Feel clumsy and have poor balance for several hours.  Feel forgetful about what happened after the procedure.  Have poor judgment for several hours.  Feel nauseous or vomit.  Have a sore throat if you had a breathing tube during the procedure.  Follow these instructions at home: For at least 24 hours after the procedure:   Do not: ? Participate in activities in which you could fall or become injured. ? Drive. ? Use heavy machinery. ? Drink alcohol. ? Take sleeping pills or medicines that cause drowsiness. ? Make important decisions or sign legal documents. ? Take care of children on your own.  Rest. Eating and drinking  Follow the diet that is recommended by your health care provider.  If you vomit, drink water, juice, or soup when you can drink without vomiting.  Make sure you have little or no nausea before eating solid foods. General instructions  Have a responsible adult stay with you until you are awake and alert.  Take over-the-counter and prescription medicines only as told by your health care provider.  If you smoke, do not smoke without supervision.  Keep all follow-up visits as told by your health care provider. This is important. Contact a health care provider if:  You keep feeling nauseous or you keep vomiting.  You feel light-headed.  You develop a rash.  You have a fever. Get help right away if:  You have trouble breathing. This information is not intended to replace  advice given to you by your health care provider. Make sure you discuss any questions you have with your health care provider. Document Released: 05/20/2015 Document Revised: 09/19/2015 Document Reviewed: 05/20/2015 Elsevier Interactive Patient Education  [employer].

## 2016-11-12 ENCOUNTER — Encounter (HOSPITAL_COMMUNITY): Payer: Self-pay

## 2016-11-12 ENCOUNTER — Other Ambulatory Visit: Payer: Self-pay

## 2016-11-12 ENCOUNTER — Encounter (HOSPITAL_COMMUNITY)
Admission: RE | Admit: 2016-11-12 | Discharge: 2016-11-12 | Disposition: A | Discharge status: Home / Self Care | Payer: Medicare HMO | Source: Ambulatory Visit | Attending: Internal Medicine | Admitting: Internal Medicine

## 2016-11-12 DIAGNOSIS — D125 Benign neoplasm of sigmoid colon: Secondary | ICD-10-CM | POA: Diagnosis not present

## 2016-11-12 DIAGNOSIS — Z79899 Other long term (current) drug therapy: Secondary | ICD-10-CM | POA: Diagnosis not present

## 2016-11-12 DIAGNOSIS — K648 Other hemorrhoids: Secondary | ICD-10-CM | POA: Diagnosis not present

## 2016-11-12 DIAGNOSIS — D123 Benign neoplasm of transverse colon: Secondary | ICD-10-CM | POA: Diagnosis not present

## 2016-11-12 DIAGNOSIS — G629 Polyneuropathy, unspecified: Secondary | ICD-10-CM | POA: Diagnosis not present

## 2016-11-12 DIAGNOSIS — R195 Other fecal abnormalities: Secondary | ICD-10-CM

## 2016-11-12 DIAGNOSIS — Z87891 Personal history of nicotine dependence: Secondary | ICD-10-CM | POA: Diagnosis not present

## 2016-11-12 HISTORY — DX: Personal history of urinary calculi: Z87.442

## 2016-11-12 HISTORY — DX: Hereditary motor and sensory neuropathy: G60.0

## 2016-11-12 LAB — CBC WITH DIFFERENTIAL/PLATELET
BASOS ABS: 0 10*3/uL (ref 0.0–0.1)
BASOS PCT: 0 %
Eosinophils Absolute: 0.1 10*3/uL (ref 0.0–0.7)
Eosinophils Relative: 1 %
HEMATOCRIT: 42.9 % (ref 39.0–52.0)
HEMOGLOBIN: 14.7 g/dL (ref 13.0–17.0)
Lymphocytes Relative: 26 %
Lymphs Abs: 2.1 10*3/uL (ref 0.7–4.0)
MCH: 31.2 pg (ref 26.0–34.0)
MCHC: 34.3 g/dL (ref 30.0–36.0)
MCV: 91.1 fL (ref 78.0–100.0)
MONOS PCT: 6 %
Monocytes Absolute: 0.5 10*3/uL (ref 0.1–1.0)
NEUTROS ABS: 5.5 10*3/uL (ref 1.7–7.7)
NEUTROS PCT: 67 %
Platelets: 209 10*3/uL (ref 150–400)
RBC: 4.71 MIL/uL (ref 4.22–5.81)
RDW: 12.5 % (ref 11.5–15.5)
WBC: 8.2 10*3/uL (ref 4.0–10.5)

## 2016-11-12 LAB — BASIC METABOLIC PANEL
ANION GAP: 6 (ref 5–15)
BUN: 10 mg/dL (ref 6–20)
CHLORIDE: 104 mmol/L (ref 101–111)
CO2: 27 mmol/L (ref 22–32)
Calcium: 8.8 mg/dL — ABNORMAL LOW (ref 8.9–10.3)
Creatinine, Ser: 1.19 mg/dL (ref 0.61–1.24)
GFR calc non Af Amer: >60 mL/min (ref >60)
Glucose, Bld: 122 mg/dL — ABNORMAL HIGH (ref 65–99)
Potassium: 4.1 mmol/L (ref 3.5–5.1)
Sodium: 137 mmol/L (ref 135–145)

## 2016-11-14 ENCOUNTER — Ambulatory Visit (HOSPITAL_COMMUNITY)
Admission: RE | Admit: 2016-11-14 | Discharge: 2016-11-14 | Disposition: A | Discharge status: Home / Self Care | Payer: Medicare HMO | Source: Ambulatory Visit | Attending: Internal Medicine | Admitting: Internal Medicine

## 2016-11-14 ENCOUNTER — Ambulatory Visit (HOSPITAL_COMMUNITY): Payer: Medicare HMO | Admitting: Anesthesiology

## 2016-11-14 ENCOUNTER — Encounter (HOSPITAL_COMMUNITY): Payer: Self-pay | Admitting: *Deleted

## 2016-11-14 ENCOUNTER — Encounter (HOSPITAL_COMMUNITY)
Admission: RE | Disposition: A | Discharge status: Home / Self Care | Payer: Self-pay | Source: Ambulatory Visit | Attending: Internal Medicine

## 2016-11-14 DIAGNOSIS — K635 Polyp of colon: Secondary | ICD-10-CM | POA: Diagnosis not present

## 2016-11-14 DIAGNOSIS — G629 Polyneuropathy, unspecified: Secondary | ICD-10-CM | POA: Insufficient documentation

## 2016-11-14 DIAGNOSIS — D123 Benign neoplasm of transverse colon: Secondary | ICD-10-CM | POA: Insufficient documentation

## 2016-11-14 DIAGNOSIS — Z79899 Other long term (current) drug therapy: Secondary | ICD-10-CM | POA: Diagnosis not present

## 2016-11-14 DIAGNOSIS — K648 Other hemorrhoids: Secondary | ICD-10-CM | POA: Diagnosis not present

## 2016-11-14 DIAGNOSIS — R195 Other fecal abnormalities: Secondary | ICD-10-CM | POA: Insufficient documentation

## 2016-11-14 DIAGNOSIS — D125 Benign neoplasm of sigmoid colon: Secondary | ICD-10-CM | POA: Diagnosis not present

## 2016-11-14 DIAGNOSIS — Z87891 Personal history of nicotine dependence: Secondary | ICD-10-CM | POA: Diagnosis not present

## 2016-11-14 HISTORY — PX: POLYPECTOMY: SHX5525

## 2016-11-14 HISTORY — PX: COLONOSCOPY WITH PROPOFOL: SHX5780

## 2016-11-14 SURGERY — COLONOSCOPY WITH PROPOFOL
Anesthesia: Monitor Anesthesia Care

## 2016-11-14 MED ORDER — FENTANYL CITRATE (PF) 100 MCG/2ML IJ SOLN
INTRAMUSCULAR | Status: AC
  Filled 2016-11-14: qty 2

## 2016-11-14 MED ORDER — CHLORHEXIDINE GLUCONATE CLOTH 2 % EX PADS: 6.0000 | MEDICATED_PAD | Freq: Once | CUTANEOUS | Status: DC

## 2016-11-14 MED ORDER — PROPOFOL 10 MG/ML IV BOLUS
INTRAVENOUS | Status: DC | PRN
  Administered 2016-11-14 (×2): 10 mg via INTRAVENOUS
  Administered 2016-11-14: 30 mg via INTRAVENOUS

## 2016-11-14 MED ORDER — PROPOFOL 10 MG/ML IV BOLUS
INTRAVENOUS | Status: AC
  Filled 2016-11-14: qty 40

## 2016-11-14 MED ORDER — FENTANYL CITRATE (PF) 100 MCG/2ML IJ SOLN
25.0000 ug | Freq: Once | INTRAMUSCULAR | Status: AC
  Administered 2016-11-14: 25 ug via INTRAVENOUS

## 2016-11-14 MED ORDER — MIDAZOLAM HCL 2 MG/2ML IJ SOLN
INTRAMUSCULAR | Status: AC
  Filled 2016-11-14: qty 2

## 2016-11-14 MED ORDER — PROPOFOL 500 MG/50ML IV EMUL
INTRAVENOUS | Status: DC | PRN
  Administered 2016-11-14: 125 ug/kg/min via INTRAVENOUS
  Administered 2016-11-14: 10:00:00 via INTRAVENOUS

## 2016-11-14 MED ORDER — LACTATED RINGERS IV SOLN
INTRAVENOUS | Status: DC
  Administered 2016-11-14: 09:00:00 via INTRAVENOUS

## 2016-11-14 MED ORDER — MIDAZOLAM HCL 2 MG/2ML IJ SOLN
1.0000 mg | INTRAMUSCULAR | Status: AC
  Administered 2016-11-14: 2 mg via INTRAVENOUS

## 2016-11-14 NOTE — Transfer of Care (Signed)
Immediate Anesthesia Transfer of Care Note  Patient: Juan Yang  Procedure(s) Performed: COLONOSCOPY WITH PROPOFOL (N/A ) POLYPECTOMY- SIGMOID COLON X3 TRANSVERSE COLON X2  Patient Location: PACU  Anesthesia Type:MAC  Level of Consciousness: awake and alert   Airway & Oxygen Therapy: Patient Spontanous Breathing  Post-op Assessment: Report given to RN  Post vital signs: Reviewed and stable  Last Vitals:  Vitals:   11/14/16 0910 11/14/16 0915  BP: (!) 158/135 (!) 154/100  Resp: (!) 21 13  Temp:    SpO2: 97% 97%    Last Pain:  Vitals:   11/14/16 0845  TempSrc: Oral  PainSc: 3       Patients Stated Pain Goal: 4 ([id number])  Complications: No apparent anesthesia complications

## 2016-11-14 NOTE — H&P (Signed)
Juan Yang is an 61 y.o. male.   Chief Complaint: Patient is here for colonoscopy. HPI: Patient is 61 year old Caucasian male who had routine stool testing in stool was positive for occult blood. He denies melena or rectal bleeding. He has chronic constipation secondary to pain medications and uses laxative on as-needed basis. He denies weight loss but he does not have a good appetite. He had virtual colonoscopy for screening purposes in 2010 and no polyps were found. Family history is negative for CRC.  H&H was 14.5 and 42.9 on 09/07/2016.  Past Medical History:  Diagnosis Date  . 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 Charcot-Marie-Tooth disease type 2   . Guaiac + stool 10/29/2016  . History of kidney stones   . Peripheral neuropathy 10/29/2016    Past Surgical History:  Procedure Laterality Date  . rt ankle surgery     Titanium hinge  . TONSILLECTOMY      History reviewed. No pertinent family history. Social History:  reports that he quit smoking about 4 years ago. His smoking use included Cigarettes. He has a 30.00 pack-year smoking history. He has never used smokeless tobacco. He reports that he does not drink alcohol or use drugs.  Allergies:  Allergies  Allergen Reactions  . Atenolol Diarrhea and Nausea Only  . Cymbalta [Duloxetine Hcl] Other (See Comments)    Pt states it shut his bladder down and could not urinate; states it made him lethargic as well.  Marland Kitchen Penicillins Hives    Medications Prior to Admission  Medication Sig Dispense Refill  . baclofen (LIORESAL) 10 MG tablet Take 10 mg by mouth 2 (two) times daily.    . clobetasol cream (TEMOVATE) 7.40 % Apply 1 application topically 2 (two) times daily as needed (for skin).     . fluticasone (CUTIVATE) 0.05 % cream Apply 1 application topically 2 (two) times daily as needed (for skin).     Marland Kitchen gabapentin (NEURONTIN) 600 MG tablet Take 300 mg by mouth 4 (four) times daily.    . mupirocin ointment  (BACTROBAN) 2 % Place 1 application into the nose 2 (two) times daily as needed (for skin).    . NUCYNTA 100 MG TABS Take 100 mg by mouth 3 (three) times daily.      Results for orders placed or performed during the hospital encounter of 11/12/16 (from the past 48 hour(s))  CBC with Differential/Platelet     Status: None   Collection Time: 11/12/16 11:31 AM  Result Value Ref Range   WBC 8.2 4.0 - 10.5 K/uL   RBC 4.71 4.22 - 5.81 MIL/uL   Hemoglobin 14.7 13.0 - 17.0 g/dL   HCT 42.9 39.0 - 52.0 %   MCV 91.1 78.0 - 100.0 fL   MCH 31.2 26.0 - 34.0 pg   MCHC 34.3 30.0 - 36.0 g/dL   RDW 12.5 11.5 - 15.5 %   Platelets 209 150 - 400 K/uL   Neutrophils Relative % 67 %   Neutro Abs 5.5 1.7 - 7.7 K/uL   Lymphocytes Relative 26 %   Lymphs Abs 2.1 0.7 - 4.0 K/uL   Monocytes Relative 6 %   Monocytes Absolute 0.5 0.1 - 1.0 K/uL   Eosinophils Relative 1 %   Eosinophils Absolute 0.1 0.0 - 0.7 K/uL   Basophils Relative 0 %   Basophils Absolute 0.0 0.0 - 0.1 K/uL  Basic metabolic panel     Status: Abnormal   Collection Time: 11/12/16 11:31 AM  Result  Value Ref Range   Sodium 137 135 - 145 mmol/L   Potassium 4.1 3.5 - 5.1 mmol/L   Chloride 104 101 - 111 mmol/L   CO2 27 22 - 32 mmol/L   Glucose, Bld 122 (H) 65 - 99 mg/dL   BUN 10 6 - 20 mg/dL   Creatinine, Ser 1.19 0.61 - 1.24 mg/dL   Calcium 8.8 (L) 8.9 - 10.3 mg/dL   GFR calc non Af Amer >60 >60 mL/min   GFR calc Af Amer >60 >60 mL/min    Comment: (NOTE) The eGFR has been calculated using the CKD EPI equation. This calculation has not been validated in all clinical situations. eGFR's persistently <60 mL/min signify possible Chronic Kidney Disease.    Anion gap 6 5 - 15   No results found.  ROS  Temperature 97.8 F (36.6 C), temperature source Oral, height 6' (1.829 m), weight 188 lb (85.3 kg). Physical Exam  Constitutional: He appears well-developed and well-nourished.  HENT:  Mouth/Throat: Oropharynx is clear and moist.  Eyes:  Conjunctivae are normal. No scleral icterus.  Neck: No thyromegaly present.  Cardiovascular: Normal rate, regular rhythm and normal heart sounds.   No murmur heard. Respiratory: Effort normal and breath sounds normal.  GI: Soft. He exhibits no distension and no mass. There is no tenderness.  Musculoskeletal: He exhibits no edema.  Lymphadenopathy:    He has no cervical adenopathy.  Neurological: He is alert.  Skin: Skin is dry.     Assessment/Plan Heme positive stool. Diagnostic colonoscopy.  Hildred Laser, MD 11/14/2016, 9:10 AM

## 2016-11-14 NOTE — Progress Notes (Signed)
Patient refused to wait in postop holding area for his wife to pull car.  Wife states "he is obstinent". I asked patient again to wait for wife to pull car to postoperative holding area. Patient stated, "Mam, bye I am out of here and I am not waiting".  Wife walking with patient.

## 2016-11-14 NOTE — Anesthesia Postprocedure Evaluation (Signed)
Anesthesia Post Note  Patient: Juan Yang  Procedure(s) Performed: COLONOSCOPY WITH PROPOFOL (N/A ) POLYPECTOMY- SIGMOID COLON X3 TRANSVERSE COLON X2  Patient location during evaluation: PACU Anesthesia Type: MAC Level of consciousness: awake and alert and oriented Pain management: pain level controlled Vital Signs Assessment: post-procedure vital signs reviewed and stable Respiratory status: spontaneous breathing Cardiovascular status: blood pressure returned to baseline and stable Postop Assessment: no apparent nausea or vomiting Anesthetic complications: no     Last Vitals:  Vitals:   11/14/16 1002 11/14/16 1015  BP: 111/79   Pulse: 81 67  Resp:  17  Temp: 36.6 C   SpO2: 100% 100%    Last Pain:  Vitals:   11/14/16 1002  TempSrc:   PainSc: 0-No pain                 Cadence Minton

## 2016-11-14 NOTE — Discharge Instructions (Signed)
No aspirin or NSAIDs for 1 week. Resume usual medications and diet as before. No driving for 24 hours. Physician will call with biopsy results.     PATIENT INSTRUCTIONS POST-ANESTHESIA  IMMEDIATELY FOLLOWING SURGERY:  Do not drive or operate machinery for the first twenty four hours after surgery.  Do not make any important decisions for twenty four hours after surgery or while taking narcotic pain medications or sedatives.  If you develop intractable nausea and vomiting or a severe headache please notify your doctor immediately.  FOLLOW-UP:  Please make an appointment with your surgeon as instructed. You do not need to follow up with anesthesia unless specifically instructed to do so.  WOUND CARE INSTRUCTIONS (if applicable):  Keep a dry clean dressing on the anesthesia/puncture wound site if there is drainage.  Once the wound has quit draining you may leave it open to air.  Generally you should leave the bandage intact for twenty four hours unless there is drainage.  If the epidural site drains for more than 36-48 hours please call the anesthesia department.  QUESTIONS?:  Please feel free to call your physician or the hospital operator if you have any questions, and they will be happy to assist you.       Colonoscopy, Adult, Care After This sheet gives you information about how to care for yourself after your procedure. Your doctor may also give you more specific instructions. If you have problems or questions, call your doctor. Follow these instructions at home: General instructions   For the first 24 hours after the procedure: ? Do not drive or use machinery. ? Do not sign important documents. ? Do not drink alcohol. ? Do your daily activities more slowly than normal. ? Eat foods that are soft and easy to digest. ? Rest often.  Take over-the-counter or prescription medicines only as told by your doctor.  It is up to you to get the results of your procedure. Ask your doctor,  or the department performing the procedure, when your results will be ready. To help cramping and bloating:  Try walking around.  Put heat on your belly (abdomen) as told by your doctor. Use a heat source that your doctor recommends, such as a moist heat pack or a heating pad. ? Put a towel between your skin and the heat source. ? Leave the heat on for 20-30 minutes. ? Remove the heat if your skin turns bright red. This is especially important if you cannot feel pain, heat, or cold. You can get burned. Eating and drinking  Drink enough fluid to keep your pee (urine) clear or pale yellow.  Return to your normal diet as told by your doctor. Avoid heavy or fried foods that are hard to digest.  Avoid drinking alcohol for as long as told by your doctor. Contact a doctor if:  You have blood in your poop (stool) 2-3 days after the procedure. Get help right away if:  You have more than a small amount of blood in your poop.  You see large clumps of tissue (blood clots) in your poop.  Your belly is swollen.  You feel sick to your stomach (nauseous).  You throw up (vomit).  You have a fever.  You have belly pain that gets worse, and medicine does not help your pain. This information is not intended to replace advice given to you by your health care provider. Make sure you discuss any questions you have with your health care provider. Document Released: 03/01/2010  Document Revised: 10/22/2015 Document Reviewed: 10/22/2015 Elsevier Interactive Patient Education  2017 [place].   Colon Polyps Polyps are tissue growths inside the body. Polyps can grow in many places, including the large intestine (colon). A polyp may be a round bump or a mushroom-shaped growth. You could have one polyp or several. Most colon polyps are noncancerous (benign). However, some colon polyps can become cancerous over time. What are the causes? The exact cause of colon polyps is not known. What increases  the risk? This condition is more likely to develop in people who:  Have a family history of colon cancer or colon polyps.  Are older than 17 or older than 45 if they are African American.  Have inflammatory bowel disease, such as ulcerative colitis or Crohn disease.  Are overweight.  Smoke cigarettes.  Do not get enough exercise.  Drink too much alcohol.  Eat a diet that is: ? High in fat and red meat. ? Low in fiber.  Had childhood cancer that was treated with abdominal radiation.  What are the signs or symptoms? Most polyps do not cause symptoms. If you have symptoms, they may include:  Blood coming from your rectum when having a bowel movement.  Blood in your stool.The stool may look dark red or black.  A change in bowel habits, such as constipation or diarrhea.  How is this diagnosed? This condition is diagnosed with a colonoscopy. This is a procedure that uses a lighted, flexible scope to look at the inside of your colon. How is this treated? Treatment for this condition involves removing any polyps that are found. Those polyps will then be tested for cancer. If cancer is found, your health care provider will talk to you about options for colon cancer treatment. Follow these instructions at home: Diet  Eat plenty of fiber, such as fruits, vegetables, and whole grains.  Eat foods that are high in calcium and vitamin D, such as milk, cheese, yogurt, eggs, liver, fish, and broccoli.  Limit foods high in fat, red meats, and processed meats, such as hot dogs, sausage, bacon, and lunch meats.  Maintain a healthy weight, or lose weight if recommended by your health care provider. General instructions  Do not smoke cigarettes.  Do not drink alcohol excessively.  Keep all follow-up visits as told by your health care provider. This is important. This includes keeping regularly scheduled colonoscopies. Talk to your health care provider about when you need a  colonoscopy.  Exercise every day or as told by your health care provider. Contact a health care provider if:  You have new or worsening bleeding during a bowel movement.  You have new or increased blood in your stool.  You have a change in bowel habits.  You unexpectedly lose weight. This information is not intended to replace advice given to you by your health care provider. Make sure you discuss any questions you have with your health care provider. Document Released: 10/24/2003 Document Revised: 07/05/2015 Document Reviewed: 12/18/2014 Elsevier Interactive Patient Education  [employer].

## 2016-11-14 NOTE — Op Note (Signed)
[facility] Patient Name: Juan Yang Procedure Date: 11/14/2016 8:58 AM MRN: [id number] Date of Birth: 09-05-1955 Attending MD: Hildred Laser , MD CSN: [id number] Age: 61 Admit Type: Outpatient Procedure:                Colonoscopy Indications:              Heme positive stool Providers:                Hildred Laser, MD, Selena Lesser Referring MD:             Monico Blitz, MD Medicines:                Propofol per Anesthesia Complications:            No immediate complications. Estimated Blood Loss:     Estimated blood loss: none. Estimated blood loss                            was minimal. Procedure:                Pre-Anesthesia Assessment:                           - Prior to the procedure, a History and Physical                            was performed, and patient medications and                            allergies were reviewed. The patient's tolerance of                            previous anesthesia was also reviewed. The risks                            and benefits of the procedure and the sedation                            options and risks were discussed with the patient.                            All questions were answered, and informed consent                            was obtained. Prior Anticoagulants: The patient                            last took ibuprofen 5 days prior to the procedure.                            ASA Grade Assessment: III - A patient with severe                            systemic disease. After reviewing the risks and  benefits, the patient was deemed in satisfactory                            condition to undergo the procedure.                           After obtaining informed consent, the colonoscope                            was passed under direct vision. Throughout the                            procedure, the patient's blood pressure, pulse, and                            oxygen saturations were  monitored continuously. The                            EC-[id number] ([id number]) scope was introduced through                            the and advanced to the the cecum, identified by                            appendiceal orifice and ileocecal valve. The                            colonoscopy was somewhat difficult due to                            significant looping. Successful completion of the                            procedure was aided by using manual pressure and                            withdrawing and reinserting the scope. The patient                            tolerated the procedure well. The quality of the                            bowel preparation was adequate. The ileocecal                            valve, appendiceal orifice, and rectum were                            photographed. Scope In: 9:26:44 AM Scope Out: 9:50:47 AM Scope Withdrawal Time: 0 hours 7 minutes 53 seconds  Total Procedure Duration: 0 hours 24 minutes 3 seconds  Findings:      The perianal and digital rectal examinations were normal.      Three sessile polyps were found in the sigmoid colon and transverse  colon. The polyps were small in size. These were biopsied with a cold       forceps for histology. The pathology specimen was placed into Bottle       Number 1.      A 6 mm polyp was found in the transverse colon. The polyp was sessile.       The polyp was removed with a cold snare. Resection and retrieval were       complete. The pathology specimen was placed into Bottle Number 1.      A 6 to 12 mm polyp was found in the distal sigmoid colon. The polyp was       multi-lobulated. The polyp was removed with a hot snare. Resection and       retrieval were complete. The pathology specimen was placed into Bottle       Number 2.      Internal hemorrhoids were found during retroflexion. The hemorrhoids       were small. Impression:               - Three small polyps in the sigmoid colon and in                             the transverse colon. Biopsied.                           - One 6 mm polyp in the transverse colon, removed                            with a cold snare. Resected and retrieved.                           - One 6 to 12 mm polyp in the distal sigmoid colon,                            removed with a hot snare. Resected and retrieved.                           - Internal hemorrhoids.                           Comment: patient had 5 polyps altogether. Moderate Sedation:      Per Anesthesia Care      Per Anesthesia Care Recommendation:           - Patient has a contact number available for                            emergencies. The signs and symptoms of potential                            delayed complications were discussed with the                            patient. Return to normal activities tomorrow.                            Written discharge  instructions were provided to the                            patient.                           - Resume previous diet today.                           - Continue present medications.                           - No aspirin, ibuprofen, naproxen, or other                            non-steroidal anti-inflammatory drugs for 7 days                            after polyp removal.                           - Await pathology results.                           - Repeat colonoscopy date to be determined after                            pending pathology results are reviewed. Procedure Code(s):        --- Professional ---                           [phone], Colonoscopy, flexible; with removal of                            tumor(s), polyp(s), or other lesion(s) by snare                            technique                           45380, 52, Colonoscopy, flexible; with biopsy,                            single or multiple Diagnosis Code(s):        --- Professional ---                           D12.5, Benign neoplasm of sigmoid  colon                           D12.3, Benign neoplasm of transverse colon (hepatic                            flexure or splenic flexure)                           K64.8, Other hemorrhoids  R19.5, Other fecal abnormalities CPT copyright 2016 American Medical Association. All rights reserved. The codes documented in this report are preliminary and upon coder review may  be revised to meet current compliance requirements. Hildred Laser, MD Hildred Laser, MD 11/14/2016 10:05:21 AM This report has been signed electronically. Number of Addenda: 0

## 2016-11-14 NOTE — Progress Notes (Signed)
Patient verbalized "something happened and the process fell down, I didn't get to see my wife before I went back to the procedure area".   Patient states that "I kept asking for her, but they came and took me to the procedure".  Apologized to patient and wife for this occurrence. Wife states "somebody came out and apologized to me already".

## 2016-11-14 NOTE — Anesthesia Preprocedure Evaluation (Signed)
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    Neuromuscular disease    GI/Hepatic Neg liver ROS,   Endo/Other  negative endocrine ROS  Renal/GU negative Renal ROS     Musculoskeletal   Abdominal   Peds  Hematology   Anesthesia Other Findings   Reproductive/Obstetrics                             Anesthesia Physical Anesthesia Plan  ASA: III  Anesthesia Plan: MAC   Post-op Pain Management:    Induction: Intravenous  PONV Risk Score and Plan:   Airway Management Planned: Simple Face Mask  Additional Equipment:   Intra-op Plan:   Post-operative Plan:   Informed Consent: I have reviewed the patients History and Physical, chart, labs and discussed the procedure including the risks, benefits and alternatives for the proposed anesthesia with the patient or authorized representative who has indicated his/her understanding and acceptance.     Plan Discussed with:   Anesthesia Plan Comments:         Anesthesia Quick Evaluation

## 2016-11-17 ENCOUNTER — Encounter (HOSPITAL_COMMUNITY): Payer: Self-pay | Admitting: Internal Medicine

## 2016-11-19 DIAGNOSIS — R69 Illness, unspecified: Secondary | ICD-10-CM | POA: Diagnosis not present

## 2016-11-21 ENCOUNTER — Inpatient Hospital Stay: Admission: RE | Admit: 2016-11-21 | Payer: Medicare HMO | Source: Ambulatory Visit

## 2016-11-27 DIAGNOSIS — Z79899 Other long term (current) drug therapy: Secondary | ICD-10-CM | POA: Diagnosis not present

## 2016-11-27 DIAGNOSIS — G629 Polyneuropathy, unspecified: Secondary | ICD-10-CM | POA: Diagnosis not present

## 2016-11-27 DIAGNOSIS — G894 Chronic pain syndrome: Secondary | ICD-10-CM | POA: Diagnosis not present

## 2016-11-27 DIAGNOSIS — G600 Hereditary motor and sensory neuropathy: Secondary | ICD-10-CM | POA: Diagnosis not present

## 2016-11-27 DIAGNOSIS — Z79891 Long term (current) use of opiate analgesic: Secondary | ICD-10-CM | POA: Diagnosis not present

## 2016-11-27 DIAGNOSIS — M25579 Pain in unspecified ankle and joints of unspecified foot: Secondary | ICD-10-CM | POA: Diagnosis not present

## 2016-12-10 DIAGNOSIS — R69 Illness, unspecified: Secondary | ICD-10-CM | POA: Diagnosis not present

## 2016-12-23 DIAGNOSIS — R69 Illness, unspecified: Secondary | ICD-10-CM | POA: Diagnosis not present

## 2017-03-11 DIAGNOSIS — R69 Illness, unspecified: Secondary | ICD-10-CM | POA: Diagnosis not present

## 2017-03-11 DIAGNOSIS — Z6829 Body mass index (BMI) 29.0-29.9, adult: Secondary | ICD-10-CM | POA: Diagnosis not present

## 2017-03-11 DIAGNOSIS — Z299 Encounter for prophylactic measures, unspecified: Secondary | ICD-10-CM | POA: Diagnosis not present

## 2017-03-11 DIAGNOSIS — R252 Cramp and spasm: Secondary | ICD-10-CM | POA: Diagnosis not present

## 2017-03-11 DIAGNOSIS — I10 Essential (primary) hypertension: Secondary | ICD-10-CM | POA: Diagnosis not present

## 2017-09-04 DIAGNOSIS — I10 Essential (primary) hypertension: Secondary | ICD-10-CM | POA: Diagnosis not present

## 2017-09-04 DIAGNOSIS — M5136 Other intervertebral disc degeneration, lumbar region: Secondary | ICD-10-CM | POA: Diagnosis not present

## 2017-09-04 DIAGNOSIS — Z299 Encounter for prophylactic measures, unspecified: Secondary | ICD-10-CM | POA: Diagnosis not present

## 2017-09-04 DIAGNOSIS — Z713 Dietary counseling and surveillance: Secondary | ICD-10-CM | POA: Diagnosis not present

## 2017-09-04 DIAGNOSIS — Z6830 Body mass index (BMI) 30.0-30.9, adult: Secondary | ICD-10-CM | POA: Diagnosis not present

## 2017-09-04 DIAGNOSIS — M5431 Sciatica, right side: Secondary | ICD-10-CM | POA: Diagnosis not present

## 2017-09-04 DIAGNOSIS — I700 Atherosclerosis of aorta: Secondary | ICD-10-CM | POA: Diagnosis not present

## 2017-09-08 DIAGNOSIS — Z7189 Other specified counseling: Secondary | ICD-10-CM | POA: Diagnosis not present

## 2017-09-08 DIAGNOSIS — R5383 Other fatigue: Secondary | ICD-10-CM | POA: Diagnosis not present

## 2017-09-08 DIAGNOSIS — M79609 Pain in unspecified limb: Secondary | ICD-10-CM | POA: Diagnosis not present

## 2017-09-08 DIAGNOSIS — Z1339 Encounter for screening examination for other mental health and behavioral disorders: Secondary | ICD-10-CM | POA: Diagnosis not present

## 2017-09-08 DIAGNOSIS — Z1331 Encounter for screening for depression: Secondary | ICD-10-CM | POA: Diagnosis not present

## 2017-09-08 DIAGNOSIS — Z125 Encounter for screening for malignant neoplasm of prostate: Secondary | ICD-10-CM | POA: Diagnosis not present

## 2017-09-08 DIAGNOSIS — Z299 Encounter for prophylactic measures, unspecified: Secondary | ICD-10-CM | POA: Diagnosis not present

## 2017-09-08 DIAGNOSIS — Z0000 Encounter for general adult medical examination without abnormal findings: Secondary | ICD-10-CM | POA: Diagnosis not present

## 2017-09-08 DIAGNOSIS — Z1211 Encounter for screening for malignant neoplasm of colon: Secondary | ICD-10-CM | POA: Diagnosis not present

## 2017-09-08 DIAGNOSIS — I10 Essential (primary) hypertension: Secondary | ICD-10-CM | POA: Diagnosis not present

## 2017-09-08 DIAGNOSIS — Z6830 Body mass index (BMI) 30.0-30.9, adult: Secondary | ICD-10-CM | POA: Diagnosis not present

## 2017-09-08 DIAGNOSIS — E7800 Pure hypercholesterolemia, unspecified: Secondary | ICD-10-CM | POA: Diagnosis not present

## 2017-09-08 DIAGNOSIS — Z79899 Other long term (current) drug therapy: Secondary | ICD-10-CM | POA: Diagnosis not present

## 2017-09-18 DIAGNOSIS — M79672 Pain in left foot: Secondary | ICD-10-CM | POA: Diagnosis not present

## 2017-09-18 DIAGNOSIS — L03032 Cellulitis of left toe: Secondary | ICD-10-CM | POA: Diagnosis not present

## 2017-09-18 DIAGNOSIS — M79675 Pain in left toe(s): Secondary | ICD-10-CM | POA: Diagnosis not present

## 2017-09-18 DIAGNOSIS — L600 Ingrowing nail: Secondary | ICD-10-CM | POA: Diagnosis not present

## 2017-10-06 DIAGNOSIS — I10 Essential (primary) hypertension: Secondary | ICD-10-CM | POA: Diagnosis not present

## 2017-10-06 DIAGNOSIS — R52 Pain, unspecified: Secondary | ICD-10-CM | POA: Diagnosis not present

## 2017-10-06 DIAGNOSIS — Z299 Encounter for prophylactic measures, unspecified: Secondary | ICD-10-CM | POA: Diagnosis not present

## 2017-10-06 DIAGNOSIS — M549 Dorsalgia, unspecified: Secondary | ICD-10-CM | POA: Diagnosis not present

## 2017-10-06 DIAGNOSIS — M533 Sacrococcygeal disorders, not elsewhere classified: Secondary | ICD-10-CM | POA: Diagnosis not present

## 2017-10-06 DIAGNOSIS — Z6830 Body mass index (BMI) 30.0-30.9, adult: Secondary | ICD-10-CM | POA: Diagnosis not present

## 2018-01-08 DIAGNOSIS — Z6829 Body mass index (BMI) 29.0-29.9, adult: Secondary | ICD-10-CM | POA: Diagnosis not present

## 2018-01-08 DIAGNOSIS — I10 Essential (primary) hypertension: Secondary | ICD-10-CM | POA: Diagnosis not present

## 2018-01-08 DIAGNOSIS — Z2821 Immunization not carried out because of patient refusal: Secondary | ICD-10-CM | POA: Diagnosis not present

## 2018-01-08 DIAGNOSIS — R69 Illness, unspecified: Secondary | ICD-10-CM | POA: Diagnosis not present

## 2018-01-08 DIAGNOSIS — M549 Dorsalgia, unspecified: Secondary | ICD-10-CM | POA: Diagnosis not present

## 2018-01-08 DIAGNOSIS — Z299 Encounter for prophylactic measures, unspecified: Secondary | ICD-10-CM | POA: Diagnosis not present

## 2018-02-01 DIAGNOSIS — N281 Cyst of kidney, acquired: Secondary | ICD-10-CM | POA: Diagnosis not present

## 2018-02-01 DIAGNOSIS — Z79899 Other long term (current) drug therapy: Secondary | ICD-10-CM | POA: Diagnosis not present

## 2018-02-01 DIAGNOSIS — R69 Illness, unspecified: Secondary | ICD-10-CM | POA: Diagnosis not present

## 2018-02-01 DIAGNOSIS — R1013 Epigastric pain: Secondary | ICD-10-CM | POA: Diagnosis not present

## 2018-02-01 DIAGNOSIS — K5901 Slow transit constipation: Secondary | ICD-10-CM | POA: Diagnosis not present

## 2018-02-01 DIAGNOSIS — R109 Unspecified abdominal pain: Secondary | ICD-10-CM | POA: Diagnosis not present

## 2018-02-09 DIAGNOSIS — Z6831 Body mass index (BMI) 31.0-31.9, adult: Secondary | ICD-10-CM | POA: Diagnosis not present

## 2018-02-09 DIAGNOSIS — N281 Cyst of kidney, acquired: Secondary | ICD-10-CM | POA: Diagnosis not present

## 2018-02-09 DIAGNOSIS — M549 Dorsalgia, unspecified: Secondary | ICD-10-CM | POA: Diagnosis not present

## 2018-02-09 DIAGNOSIS — Z299 Encounter for prophylactic measures, unspecified: Secondary | ICD-10-CM | POA: Diagnosis not present

## 2018-02-09 DIAGNOSIS — R0602 Shortness of breath: Secondary | ICD-10-CM | POA: Diagnosis not present

## 2018-02-09 DIAGNOSIS — I10 Essential (primary) hypertension: Secondary | ICD-10-CM | POA: Diagnosis not present

## 2018-02-09 DIAGNOSIS — R69 Illness, unspecified: Secondary | ICD-10-CM | POA: Diagnosis not present

## 2018-02-09 DIAGNOSIS — I714 Abdominal aortic aneurysm, without rupture: Secondary | ICD-10-CM | POA: Diagnosis not present

## 2018-06-16 DIAGNOSIS — M5416 Radiculopathy, lumbar region: Secondary | ICD-10-CM | POA: Diagnosis not present

## 2018-06-16 DIAGNOSIS — M47816 Spondylosis without myelopathy or radiculopathy, lumbar region: Secondary | ICD-10-CM | POA: Diagnosis not present

## 2018-06-24 DIAGNOSIS — M545 Low back pain: Secondary | ICD-10-CM | POA: Diagnosis not present

## 2018-07-13 DIAGNOSIS — M5416 Radiculopathy, lumbar region: Secondary | ICD-10-CM | POA: Diagnosis not present

## 2018-07-13 DIAGNOSIS — R2681 Unsteadiness on feet: Secondary | ICD-10-CM | POA: Diagnosis not present

## 2018-07-13 DIAGNOSIS — M47816 Spondylosis without myelopathy or radiculopathy, lumbar region: Secondary | ICD-10-CM | POA: Diagnosis not present

## 2018-07-13 DIAGNOSIS — M545 Low back pain: Secondary | ICD-10-CM | POA: Diagnosis not present

## 2018-07-21 DIAGNOSIS — M545 Low back pain: Secondary | ICD-10-CM | POA: Diagnosis not present

## 2018-07-21 DIAGNOSIS — M5416 Radiculopathy, lumbar region: Secondary | ICD-10-CM | POA: Diagnosis not present

## 2018-08-18 DIAGNOSIS — R69 Illness, unspecified: Secondary | ICD-10-CM | POA: Diagnosis not present

## 2018-08-18 DIAGNOSIS — Z299 Encounter for prophylactic measures, unspecified: Secondary | ICD-10-CM | POA: Diagnosis not present

## 2018-08-18 DIAGNOSIS — Z6831 Body mass index (BMI) 31.0-31.9, adult: Secondary | ICD-10-CM | POA: Diagnosis not present

## 2018-08-18 DIAGNOSIS — M549 Dorsalgia, unspecified: Secondary | ICD-10-CM | POA: Diagnosis not present

## 2018-08-18 DIAGNOSIS — I10 Essential (primary) hypertension: Secondary | ICD-10-CM | POA: Diagnosis not present

## 2018-08-18 DIAGNOSIS — L600 Ingrowing nail: Secondary | ICD-10-CM | POA: Diagnosis not present

## 2018-08-24 DIAGNOSIS — M545 Low back pain: Secondary | ICD-10-CM | POA: Diagnosis not present

## 2018-08-24 DIAGNOSIS — M47816 Spondylosis without myelopathy or radiculopathy, lumbar region: Secondary | ICD-10-CM | POA: Diagnosis not present

## 2018-08-24 DIAGNOSIS — M5416 Radiculopathy, lumbar region: Secondary | ICD-10-CM | POA: Diagnosis not present

## 2018-09-01 DIAGNOSIS — L03031 Cellulitis of right toe: Secondary | ICD-10-CM | POA: Diagnosis not present

## 2018-09-01 DIAGNOSIS — M79671 Pain in right foot: Secondary | ICD-10-CM | POA: Diagnosis not present

## 2018-09-01 DIAGNOSIS — M5416 Radiculopathy, lumbar region: Secondary | ICD-10-CM | POA: Diagnosis not present

## 2018-09-01 DIAGNOSIS — M545 Low back pain: Secondary | ICD-10-CM | POA: Diagnosis not present

## 2018-09-01 DIAGNOSIS — L600 Ingrowing nail: Secondary | ICD-10-CM | POA: Diagnosis not present

## 2018-09-01 DIAGNOSIS — M79674 Pain in right toe(s): Secondary | ICD-10-CM | POA: Diagnosis not present

## 2018-09-13 DIAGNOSIS — Z125 Encounter for screening for malignant neoplasm of prostate: Secondary | ICD-10-CM | POA: Diagnosis not present

## 2018-09-13 DIAGNOSIS — R69 Illness, unspecified: Secondary | ICD-10-CM | POA: Diagnosis not present

## 2018-09-13 DIAGNOSIS — Z1331 Encounter for screening for depression: Secondary | ICD-10-CM | POA: Diagnosis not present

## 2018-09-13 DIAGNOSIS — Z299 Encounter for prophylactic measures, unspecified: Secondary | ICD-10-CM | POA: Diagnosis not present

## 2018-09-13 DIAGNOSIS — Z1339 Encounter for screening examination for other mental health and behavioral disorders: Secondary | ICD-10-CM | POA: Diagnosis not present

## 2018-09-13 DIAGNOSIS — Z1211 Encounter for screening for malignant neoplasm of colon: Secondary | ICD-10-CM | POA: Diagnosis not present

## 2018-09-13 DIAGNOSIS — Z79899 Other long term (current) drug therapy: Secondary | ICD-10-CM | POA: Diagnosis not present

## 2018-09-13 DIAGNOSIS — Z6831 Body mass index (BMI) 31.0-31.9, adult: Secondary | ICD-10-CM | POA: Diagnosis not present

## 2018-09-13 DIAGNOSIS — I10 Essential (primary) hypertension: Secondary | ICD-10-CM | POA: Diagnosis not present

## 2018-09-13 DIAGNOSIS — R5383 Other fatigue: Secondary | ICD-10-CM | POA: Diagnosis not present

## 2018-09-13 DIAGNOSIS — Z7189 Other specified counseling: Secondary | ICD-10-CM | POA: Diagnosis not present

## 2018-09-13 DIAGNOSIS — Z0000 Encounter for general adult medical examination without abnormal findings: Secondary | ICD-10-CM | POA: Diagnosis not present

## 2018-09-20 DIAGNOSIS — L03031 Cellulitis of right toe: Secondary | ICD-10-CM | POA: Diagnosis not present

## 2018-09-20 DIAGNOSIS — M79671 Pain in right foot: Secondary | ICD-10-CM | POA: Diagnosis not present

## 2018-09-20 DIAGNOSIS — L600 Ingrowing nail: Secondary | ICD-10-CM | POA: Diagnosis not present

## 2018-09-20 DIAGNOSIS — M79674 Pain in right toe(s): Secondary | ICD-10-CM | POA: Diagnosis not present

## 2018-09-24 DIAGNOSIS — M5416 Radiculopathy, lumbar region: Secondary | ICD-10-CM | POA: Diagnosis not present

## 2018-09-24 DIAGNOSIS — M545 Low back pain: Secondary | ICD-10-CM | POA: Diagnosis not present

## 2018-10-06 DIAGNOSIS — L600 Ingrowing nail: Secondary | ICD-10-CM | POA: Diagnosis not present

## 2018-10-06 DIAGNOSIS — M79675 Pain in left toe(s): Secondary | ICD-10-CM | POA: Diagnosis not present

## 2018-10-06 DIAGNOSIS — L03032 Cellulitis of left toe: Secondary | ICD-10-CM | POA: Diagnosis not present

## 2018-10-06 DIAGNOSIS — M79672 Pain in left foot: Secondary | ICD-10-CM | POA: Diagnosis not present

## 2018-10-20 DIAGNOSIS — M545 Low back pain: Secondary | ICD-10-CM | POA: Diagnosis not present

## 2018-10-20 DIAGNOSIS — M47816 Spondylosis without myelopathy or radiculopathy, lumbar region: Secondary | ICD-10-CM | POA: Diagnosis not present

## 2018-10-20 DIAGNOSIS — M79671 Pain in right foot: Secondary | ICD-10-CM | POA: Diagnosis not present

## 2018-10-20 DIAGNOSIS — L600 Ingrowing nail: Secondary | ICD-10-CM | POA: Diagnosis not present

## 2018-10-20 DIAGNOSIS — L03031 Cellulitis of right toe: Secondary | ICD-10-CM | POA: Diagnosis not present

## 2018-10-20 DIAGNOSIS — M5416 Radiculopathy, lumbar region: Secondary | ICD-10-CM | POA: Diagnosis not present

## 2018-10-20 DIAGNOSIS — M79674 Pain in right toe(s): Secondary | ICD-10-CM | POA: Diagnosis not present

## 2018-11-01 DIAGNOSIS — M47816 Spondylosis without myelopathy or radiculopathy, lumbar region: Secondary | ICD-10-CM | POA: Diagnosis not present

## 2018-11-01 DIAGNOSIS — M545 Low back pain: Secondary | ICD-10-CM | POA: Diagnosis not present

## 2018-11-03 DIAGNOSIS — M79674 Pain in right toe(s): Secondary | ICD-10-CM | POA: Diagnosis not present

## 2018-11-03 DIAGNOSIS — M79671 Pain in right foot: Secondary | ICD-10-CM | POA: Diagnosis not present

## 2018-11-03 DIAGNOSIS — L03031 Cellulitis of right toe: Secondary | ICD-10-CM | POA: Diagnosis not present

## 2018-11-03 DIAGNOSIS — L600 Ingrowing nail: Secondary | ICD-10-CM | POA: Diagnosis not present

## 2018-12-06 DIAGNOSIS — M545 Low back pain: Secondary | ICD-10-CM | POA: Diagnosis not present

## 2018-12-06 DIAGNOSIS — M5416 Radiculopathy, lumbar region: Secondary | ICD-10-CM | POA: Diagnosis not present

## 2018-12-21 DIAGNOSIS — R69 Illness, unspecified: Secondary | ICD-10-CM | POA: Diagnosis not present

## 2018-12-21 DIAGNOSIS — U071 COVID-19: Secondary | ICD-10-CM | POA: Diagnosis not present

## 2018-12-21 DIAGNOSIS — Z6831 Body mass index (BMI) 31.0-31.9, adult: Secondary | ICD-10-CM | POA: Diagnosis not present

## 2018-12-21 DIAGNOSIS — I10 Essential (primary) hypertension: Secondary | ICD-10-CM | POA: Diagnosis not present

## 2018-12-21 DIAGNOSIS — Z299 Encounter for prophylactic measures, unspecified: Secondary | ICD-10-CM | POA: Diagnosis not present

## 2019-01-14 DIAGNOSIS — Z2821 Immunization not carried out because of patient refusal: Secondary | ICD-10-CM | POA: Diagnosis not present

## 2019-01-14 DIAGNOSIS — J449 Chronic obstructive pulmonary disease, unspecified: Secondary | ICD-10-CM | POA: Diagnosis not present

## 2019-01-14 DIAGNOSIS — Z6831 Body mass index (BMI) 31.0-31.9, adult: Secondary | ICD-10-CM | POA: Diagnosis not present

## 2019-01-14 DIAGNOSIS — Z299 Encounter for prophylactic measures, unspecified: Secondary | ICD-10-CM | POA: Diagnosis not present

## 2019-01-14 DIAGNOSIS — I10 Essential (primary) hypertension: Secondary | ICD-10-CM | POA: Diagnosis not present

## 2019-01-14 DIAGNOSIS — I714 Abdominal aortic aneurysm, without rupture: Secondary | ICD-10-CM | POA: Diagnosis not present

## 2019-02-02 DIAGNOSIS — M5416 Radiculopathy, lumbar region: Secondary | ICD-10-CM | POA: Diagnosis not present

## 2019-02-02 DIAGNOSIS — M545 Low back pain: Secondary | ICD-10-CM | POA: Diagnosis not present

## 2019-03-29 DIAGNOSIS — M47816 Spondylosis without myelopathy or radiculopathy, lumbar region: Secondary | ICD-10-CM | POA: Diagnosis not present

## 2019-03-29 DIAGNOSIS — M545 Low back pain: Secondary | ICD-10-CM | POA: Diagnosis not present

## 2019-03-29 DIAGNOSIS — M48061 Spinal stenosis, lumbar region without neurogenic claudication: Secondary | ICD-10-CM | POA: Diagnosis not present

## 2019-04-01 DIAGNOSIS — M5410 Radiculopathy, site unspecified: Secondary | ICD-10-CM | POA: Diagnosis not present

## 2019-04-12 DIAGNOSIS — M549 Dorsalgia, unspecified: Secondary | ICD-10-CM | POA: Diagnosis not present

## 2019-04-12 DIAGNOSIS — R69 Illness, unspecified: Secondary | ICD-10-CM | POA: Diagnosis not present

## 2019-04-12 DIAGNOSIS — Z299 Encounter for prophylactic measures, unspecified: Secondary | ICD-10-CM | POA: Diagnosis not present

## 2019-04-12 DIAGNOSIS — Z6831 Body mass index (BMI) 31.0-31.9, adult: Secondary | ICD-10-CM | POA: Diagnosis not present

## 2019-04-12 DIAGNOSIS — I10 Essential (primary) hypertension: Secondary | ICD-10-CM | POA: Diagnosis not present

## 2019-04-12 DIAGNOSIS — J449 Chronic obstructive pulmonary disease, unspecified: Secondary | ICD-10-CM | POA: Diagnosis not present

## 2019-04-26 DIAGNOSIS — M4807 Spinal stenosis, lumbosacral region: Secondary | ICD-10-CM | POA: Diagnosis not present

## 2019-04-26 DIAGNOSIS — M5410 Radiculopathy, site unspecified: Secondary | ICD-10-CM | POA: Diagnosis not present

## 2019-04-29 ENCOUNTER — Other Ambulatory Visit: Payer: Self-pay | Admitting: Neurosurgery

## 2019-04-29 DIAGNOSIS — Z6830 Body mass index (BMI) 30.0-30.9, adult: Secondary | ICD-10-CM | POA: Diagnosis not present

## 2019-04-29 DIAGNOSIS — M5416 Radiculopathy, lumbar region: Secondary | ICD-10-CM | POA: Diagnosis not present

## 2019-04-29 DIAGNOSIS — M5410 Radiculopathy, site unspecified: Secondary | ICD-10-CM | POA: Diagnosis not present

## 2019-04-29 DIAGNOSIS — I10 Essential (primary) hypertension: Secondary | ICD-10-CM | POA: Diagnosis not present

## 2019-05-18 ENCOUNTER — Other Ambulatory Visit: Payer: Self-pay | Admitting: Neurosurgery

## 2019-05-20 NOTE — Pre-Procedure Instructions (Signed)
Juan Yang  05/20/2019      [facility] - [place] [place] [address] Phone: [phone] Fax: [phone]    Your procedure is scheduled on 05/24/19.  Report to [facility] Admitting at 530 A.M.  Call this number if you have problems the morning of surgery:  [phone]   Remember:  Do not eat or drink after midnight.  Y    Take these medicines the morning of surgery with A SIP OF WATER ---ALL INHALERS,TYLENOL,NORVASC,BACLOFEN,NEURONTIN,METOPROLOL,WELLBUTRIN    Do not wear jewelry, make-up or nail polish.  Do not wear lotions, powders, or perfumes, or deodorant.  Do not shave 48 hours prior to surgery.  Men may shave face and neck.  Do not bring valuables to the hospital.  [facility] is not responsible for any belongings or valuables.  Contacts, dentures or bridgework may not be worn into surgery.  Leave your suitcase in the car.  After surgery it may be brought to your room.  For patients admitted to the hospital, discharge time will be determined by your treatment team.  Patients discharged the day of surgery will not be allowed to drive home.   Do not take any aspirin,anti-inflammatories,vitamins,or herbal supplements 5-7 days prior to surgery. Please read over the following fact sheets that you were given. MRSA Information   [facility] - Preparing for Surgery  Before surgery, you can play an important role.  Because skin is not sterile, your skin needs to be as free of germs as possible.  You can reduce the number of germs on you skin by washing with CHG (chlorahexidine gluconate) soap before surgery.  CHG is an antiseptic cleaner which kills germs and bonds with the skin to continue killing germs even after washing.  Oral Hygiene is also important in reducing the risk of infection.  Remember to brush your teeth with your regular toothpaste the morning of surgery.  Please DO NOT use if you have an  allergy to CHG or antibacterial soaps.  If your skin becomes reddened/irritated stop using the CHG and inform your nurse when you arrive at Short Stay.  Do not shave (including legs and underarms) for at least 48 hours prior to the first CHG shower.  You may shave your face.  Please follow these instructions carefully:   1.  Shower with CHG Soap the night before surgery and the morning of Surgery.  2.  If you choose to wash your hair, wash your hair first as usual with your normal shampoo.  3.  After you shampoo, rinse your hair and body thoroughly to remove the shampoo. 4.  Use CHG as you would any other liquid soap.  You can apply chg directly to the skin and wash gently with a      scrungie or washcloth.           5.  Apply the CHG Soap to your body ONLY FROM THE NECK DOWN.   Do not use on open wounds or open sores. Avoid contact with your eyes, ears, mouth and genitals (private parts).  Wash genitals (private parts) with your normal soap.  6.  Wash thoroughly, paying special attention to the area where your surgery will be performed.  7.  Thoroughly rinse your body with warm water from the neck down.  8.  DO NOT shower/wash with your normal soap after using and rinsing off the CHG Soap.  9.  Pat yourself dry with a clean towel.            10.  Wear clean pajamas.            11.  Place clean sheets on your bed the night of your first shower and do not sleep with pets.  Day of Surgery  Do not apply any lotions/deoderants the morning of surgery.   Please wear clean clothes to the hospital/surgery center. Remember to brush your teeth with toothpaste.

## 2019-05-23 ENCOUNTER — Encounter (HOSPITAL_COMMUNITY)
Admission: RE | Admit: 2019-05-23 | Discharge: 2019-05-23 | Disposition: A | Discharge status: Home / Self Care | Payer: Medicare HMO | Source: Ambulatory Visit | Attending: Neurosurgery | Admitting: Neurosurgery

## 2019-05-23 ENCOUNTER — Encounter (HOSPITAL_COMMUNITY): Payer: Self-pay

## 2019-05-23 ENCOUNTER — Other Ambulatory Visit: Payer: Self-pay

## 2019-05-23 ENCOUNTER — Other Ambulatory Visit (HOSPITAL_COMMUNITY)
Admission: RE | Admit: 2019-05-23 | Discharge: 2019-05-23 | Disposition: A | Discharge status: Home / Self Care | Payer: Medicare HMO | Source: Ambulatory Visit | Attending: Neurosurgery | Admitting: Neurosurgery

## 2019-05-23 DIAGNOSIS — Z01812 Encounter for preprocedural laboratory examination: Secondary | ICD-10-CM | POA: Diagnosis not present

## 2019-05-23 DIAGNOSIS — Z20822 Contact with and (suspected) exposure to covid-19: Secondary | ICD-10-CM | POA: Insufficient documentation

## 2019-05-23 DIAGNOSIS — Z01810 Encounter for preprocedural cardiovascular examination: Secondary | ICD-10-CM | POA: Insufficient documentation

## 2019-05-23 HISTORY — DX: Unspecified osteoarthritis, unspecified site: M19.90

## 2019-05-23 HISTORY — DX: Dyspnea, unspecified: R06.00

## 2019-05-23 HISTORY — DX: Essential (primary) hypertension: I10

## 2019-05-23 LAB — BASIC METABOLIC PANEL
Anion gap: 8 (ref 5–15)
BUN: 13 mg/dL (ref 8–23)
CO2: 24 mmol/L (ref 22–32)
Calcium: 8.9 mg/dL (ref 8.9–10.3)
Chloride: 106 mmol/L (ref 98–111)
Creatinine, Ser: 1.29 mg/dL — ABNORMAL HIGH (ref 0.61–1.24)
GFR calc Af Amer: >60 mL/min (ref >60)
GFR calc non Af Amer: 59 mL/min — ABNORMAL LOW (ref >60)
Glucose, Bld: 90 mg/dL (ref 70–99)
Potassium: 3.8 mmol/L (ref 3.5–5.1)
Sodium: 138 mmol/L (ref 135–145)

## 2019-05-23 LAB — TYPE AND SCREEN
ABO/RH(D): B NEG
Antibody Screen: NEGATIVE

## 2019-05-23 LAB — CBC
HCT: 44.7 % (ref 39.0–52.0)
Hemoglobin: 14.9 g/dL (ref 13.0–17.0)
MCH: 32.4 pg (ref 26.0–34.0)
MCHC: 33.3 g/dL (ref 30.0–36.0)
MCV: 97.2 fL (ref 80.0–100.0)
Platelets: 244 10*3/uL (ref 150–400)
RBC: 4.6 MIL/uL (ref 4.22–5.81)
RDW: 12.4 % (ref 11.5–15.5)
WBC: 10.4 10*3/uL (ref 4.0–10.5)
nRBC: 0 % (ref 0.0–0.2)

## 2019-05-23 LAB — SURGICAL PCR SCREEN
MRSA, PCR: NEGATIVE
Staphylococcus aureus: NEGATIVE

## 2019-05-23 LAB — ABO/RH: ABO/RH(D): B NEG

## 2019-05-23 LAB — SARS CORONAVIRUS 2 (TAT 6-24 HRS): SARS Coronavirus 2: NEGATIVE

## 2019-05-23 MED ORDER — VANCOMYCIN HCL 1500 MG/300ML IV SOLN
1500.0000 mg | INTRAVENOUS | Status: AC
  Administered 2019-05-24: 07:00:00 1500 mg via INTRAVENOUS
  Filled 2019-05-23: qty 300

## 2019-05-23 MED ORDER — CHLORHEXIDINE GLUCONATE CLOTH 2 % EX PADS: 6.0000 | MEDICATED_PAD | Freq: Once | CUTANEOUS | Status: DC

## 2019-05-23 NOTE — Progress Notes (Signed)
   PCP - Dr Brigitte Pulse Cardiologist - na  hest x-ray - na  EKG - today Stress Test - na ECHO - na Cardiac Cath - na  Sleep Study - yes CPAP - no Blood Thinner Instructions: Aspirin Instructions:     COVID TEST- for today   Anesthesia review: heart hx  Patient denies shortness of breath, fever, cough and chest pain at PAT appointment   All instructions explained to the patient, with a verbal understanding of the material. Patient agrees to go over the instructions while at home for a better understanding. Patient also instructed to self quarantine after being tested for COVID-19. The opportunity to ask questions was provided.

## 2019-05-24 ENCOUNTER — Encounter (HOSPITAL_COMMUNITY): Payer: Self-pay

## 2019-05-24 ENCOUNTER — Ambulatory Visit (HOSPITAL_COMMUNITY): Payer: Medicare HMO | Admitting: Certified Registered Nurse Anesthetist

## 2019-05-24 ENCOUNTER — Other Ambulatory Visit: Payer: Self-pay

## 2019-05-24 ENCOUNTER — Encounter (HOSPITAL_COMMUNITY)
Admission: RE | Disposition: A | Discharge status: Home / Self Care | Payer: Self-pay | Source: Home / Self Care | Attending: Neurosurgery

## 2019-05-24 ENCOUNTER — Ambulatory Visit (HOSPITAL_COMMUNITY)
Admission: RE | Admit: 2019-05-24 | Discharge: 2019-05-24 | Disposition: A | Discharge status: Home / Self Care | Payer: Medicare HMO | Attending: Neurosurgery | Admitting: Neurosurgery

## 2019-05-24 ENCOUNTER — Ambulatory Visit (HOSPITAL_COMMUNITY): Payer: Medicare HMO

## 2019-05-24 DIAGNOSIS — Z87891 Personal history of nicotine dependence: Secondary | ICD-10-CM | POA: Diagnosis not present

## 2019-05-24 DIAGNOSIS — I10 Essential (primary) hypertension: Secondary | ICD-10-CM | POA: Insufficient documentation

## 2019-05-24 DIAGNOSIS — Z79899 Other long term (current) drug therapy: Secondary | ICD-10-CM | POA: Diagnosis not present

## 2019-05-24 DIAGNOSIS — M4807 Spinal stenosis, lumbosacral region: Secondary | ICD-10-CM | POA: Diagnosis not present

## 2019-05-24 DIAGNOSIS — G96191 Perineural cyst: Secondary | ICD-10-CM | POA: Diagnosis not present

## 2019-05-24 DIAGNOSIS — Z419 Encounter for procedure for purposes other than remedying health state, unspecified: Secondary | ICD-10-CM

## 2019-05-24 DIAGNOSIS — M5417 Radiculopathy, lumbosacral region: Secondary | ICD-10-CM | POA: Diagnosis not present

## 2019-05-24 DIAGNOSIS — M7138 Other bursal cyst, other site: Secondary | ICD-10-CM | POA: Diagnosis not present

## 2019-05-24 DIAGNOSIS — G600 Hereditary motor and sensory neuropathy: Secondary | ICD-10-CM | POA: Insufficient documentation

## 2019-05-24 DIAGNOSIS — Z981 Arthrodesis status: Secondary | ICD-10-CM | POA: Diagnosis not present

## 2019-05-24 HISTORY — PX: LUMBAR LAMINECTOMY/DECOMPRESSION MICRODISCECTOMY: SHX5026

## 2019-05-24 SURGERY — LUMBAR LAMINECTOMY/DECOMPRESSION MICRODISCECTOMY 1 LEVEL
Anesthesia: General | Site: Spine Lumbar | Laterality: Right

## 2019-05-24 MED ORDER — EPHEDRINE 5 MG/ML INJ
INTRAVENOUS | Status: AC
  Filled 2019-05-24: qty 10

## 2019-05-24 MED ORDER — ONDANSETRON HCL 4 MG/2ML IJ SOLN
INTRAMUSCULAR | Status: DC | PRN
  Administered 2019-05-24: 4 mg via INTRAVENOUS

## 2019-05-24 MED ORDER — FENTANYL CITRATE (PF) 250 MCG/5ML IJ SOLN
INTRAMUSCULAR | Status: AC
  Filled 2019-05-24: qty 5

## 2019-05-24 MED ORDER — MIDAZOLAM HCL 5 MG/5ML IJ SOLN
INTRAMUSCULAR | Status: DC | PRN
  Administered 2019-05-24: 2 mg via INTRAVENOUS

## 2019-05-24 MED ORDER — BUPIVACAINE HCL (PF) 0.5 % IJ SOLN
INTRAMUSCULAR | Status: AC
  Filled 2019-05-24: qty 30

## 2019-05-24 MED ORDER — PHENYLEPHRINE 40 MCG/ML (10ML) SYRINGE FOR IV PUSH (FOR BLOOD PRESSURE SUPPORT)
PREFILLED_SYRINGE | INTRAVENOUS | Status: DC | PRN
  Administered 2019-05-24: 80 ug via INTRAVENOUS

## 2019-05-24 MED ORDER — LIDOCAINE-EPINEPHRINE 1 %-1:100000 IJ SOLN
INTRAMUSCULAR | Status: AC
  Filled 2019-05-24: qty 1

## 2019-05-24 MED ORDER — LIDOCAINE 2% (20 MG/ML) 5 ML SYRINGE
INTRAMUSCULAR | Status: AC
  Filled 2019-05-24: qty 5

## 2019-05-24 MED ORDER — PROPOFOL 10 MG/ML IV BOLUS
INTRAVENOUS | Status: DC | PRN
  Administered 2019-05-24: 150 mg via INTRAVENOUS

## 2019-05-24 MED ORDER — LACTATED RINGERS IV SOLN: INTRAVENOUS | Status: DC

## 2019-05-24 MED ORDER — EPHEDRINE SULFATE-NACL 50-0.9 MG/10ML-% IV SOSY
PREFILLED_SYRINGE | INTRAVENOUS | Status: DC | PRN
  Administered 2019-05-24: 5 mg via INTRAVENOUS
  Administered 2019-05-24: 10 mg via INTRAVENOUS
  Administered 2019-05-24 (×3): 5 mg via INTRAVENOUS

## 2019-05-24 MED ORDER — THROMBIN 5000 UNITS EX SOLR
OROMUCOSAL | Status: DC | PRN
  Administered 2019-05-24: 09:00:00 5 mL via TOPICAL

## 2019-05-24 MED ORDER — LIDOCAINE-EPINEPHRINE 1 %-1:100000 IJ SOLN
INTRAMUSCULAR | Status: DC | PRN
  Administered 2019-05-24: 8 mL

## 2019-05-24 MED ORDER — PHENYLEPHRINE 40 MCG/ML (10ML) SYRINGE FOR IV PUSH (FOR BLOOD PRESSURE SUPPORT)
PREFILLED_SYRINGE | INTRAVENOUS | Status: AC
  Filled 2019-05-24: qty 20

## 2019-05-24 MED ORDER — PROMETHAZINE HCL 25 MG/ML IJ SOLN: 6.2500 mg | INTRAMUSCULAR | Status: DC | PRN

## 2019-05-24 MED ORDER — ACETAMINOPHEN 10 MG/ML IV SOLN: 1000.0000 mg | Freq: Once | INTRAVENOUS | Status: DC | PRN

## 2019-05-24 MED ORDER — METHYLPREDNISOLONE ACETATE 80 MG/ML IJ SUSP
INTRAMUSCULAR | Status: DC | PRN
  Administered 2019-05-24: 40 mg

## 2019-05-24 MED ORDER — THROMBIN 5000 UNITS EX SOLR
CUTANEOUS | Status: AC
  Filled 2019-05-24: qty 5000

## 2019-05-24 MED ORDER — MIDAZOLAM HCL 2 MG/2ML IJ SOLN
INTRAMUSCULAR | Status: AC
  Filled 2019-05-24: qty 2

## 2019-05-24 MED ORDER — HYDROMORPHONE HCL 1 MG/ML IJ SOLN
0.2500 mg | INTRAMUSCULAR | Status: DC | PRN
  Administered 2019-05-24: 0.5 mg via INTRAVENOUS

## 2019-05-24 MED ORDER — DEXAMETHASONE SODIUM PHOSPHATE 10 MG/ML IJ SOLN
INTRAMUSCULAR | Status: DC | PRN
  Administered 2019-05-24: 4 mg via INTRAVENOUS

## 2019-05-24 MED ORDER — SUGAMMADEX SODIUM 200 MG/2ML IV SOLN
INTRAVENOUS | Status: DC | PRN
  Administered 2019-05-24: 200 mg via INTRAVENOUS

## 2019-05-24 MED ORDER — ONDANSETRON HCL 4 MG/2ML IJ SOLN
INTRAMUSCULAR | Status: AC
  Filled 2019-05-24: qty 2

## 2019-05-24 MED ORDER — DEXAMETHASONE SODIUM PHOSPHATE 10 MG/ML IJ SOLN
INTRAMUSCULAR | Status: AC
  Filled 2019-05-24: qty 1

## 2019-05-24 MED ORDER — HYDROMORPHONE HCL 1 MG/ML IJ SOLN
INTRAMUSCULAR | Status: AC
  Filled 2019-05-24: qty 1

## 2019-05-24 MED ORDER — METHYLPREDNISOLONE ACETATE 80 MG/ML IJ SUSP
INTRAMUSCULAR | Status: AC
  Filled 2019-05-24: qty 1

## 2019-05-24 MED ORDER — BUPIVACAINE HCL (PF) 0.5 % IJ SOLN
INTRAMUSCULAR | Status: DC | PRN
  Administered 2019-05-24: 20 mL

## 2019-05-24 MED ORDER — ROCURONIUM BROMIDE 10 MG/ML (PF) SYRINGE
PREFILLED_SYRINGE | INTRAVENOUS | Status: AC
  Filled 2019-05-24: qty 10

## 2019-05-24 MED ORDER — LIDOCAINE 2% (20 MG/ML) 5 ML SYRINGE
INTRAMUSCULAR | Status: DC | PRN
  Administered 2019-05-24: 60 mg via INTRAVENOUS

## 2019-05-24 MED ORDER — 0.9 % SODIUM CHLORIDE (POUR BTL) OPTIME
TOPICAL | Status: DC | PRN
  Administered 2019-05-24: 09:00:00 1000 mL

## 2019-05-24 MED ORDER — PHENYLEPHRINE HCL-NACL 10-0.9 MG/250ML-% IV SOLN
INTRAVENOUS | Status: DC | PRN
  Administered 2019-05-24: 25 ug/min via INTRAVENOUS

## 2019-05-24 MED ORDER — SODIUM CHLORIDE 0.9 % IV SOLN
INTRAVENOUS | Status: DC | PRN
  Administered 2019-05-24: 09:00:00 500 mL

## 2019-05-24 MED ORDER — PROPOFOL 10 MG/ML IV BOLUS
INTRAVENOUS | Status: AC
  Filled 2019-05-24: qty 40

## 2019-05-24 MED ORDER — ROCURONIUM BROMIDE 10 MG/ML (PF) SYRINGE
PREFILLED_SYRINGE | INTRAVENOUS | Status: DC | PRN
  Administered 2019-05-24: 60 mg via INTRAVENOUS

## 2019-05-24 MED ORDER — HYDROMORPHONE HCL 1 MG/ML IJ SOLN: 0.2500 mg | INTRAMUSCULAR | Status: DC | PRN

## 2019-05-24 MED ORDER — PHENYLEPHRINE 40 MCG/ML (10ML) SYRINGE FOR IV PUSH (FOR BLOOD PRESSURE SUPPORT)
PREFILLED_SYRINGE | INTRAVENOUS | Status: AC
  Filled 2019-05-24: qty 10

## 2019-05-24 MED ORDER — FENTANYL CITRATE (PF) 250 MCG/5ML IJ SOLN
INTRAMUSCULAR | Status: DC | PRN
  Administered 2019-05-24: 50 ug via INTRAVENOUS
  Administered 2019-05-24: 100 ug via INTRAVENOUS

## 2019-05-24 SURGICAL SUPPLY — 61 items
BAG DECANTER FOR FLEXI CONT (MISCELLANEOUS) ×3 IMPLANT
BAND RUBBER #18 3X1/16 STRL (MISCELLANEOUS) ×6 IMPLANT
BENZOIN TINCTURE PRP APPL 2/3 (GAUZE/BANDAGES/DRESSINGS) ×3 IMPLANT
BLADE CLIPPER SURG (BLADE) IMPLANT
BLADE SURG 11 STRL SS (BLADE) ×3 IMPLANT
BUR MATCHSTICK NEURO 3.0 LAGG (BURR) ×2 IMPLANT
BUR PRECISION FLUTE 5.0 (BURR) ×2 IMPLANT
CANISTER SUCT 3000ML PPV (MISCELLANEOUS) ×3 IMPLANT
CARTRIDGE OIL MAESTRO DRILL (MISCELLANEOUS) ×1 IMPLANT
CLOSURE WOUND 1/2 X4 (GAUZE/BANDAGES/DRESSINGS) ×1
CNTNR URN SCR LID CUP LEK RST (MISCELLANEOUS) IMPLANT
CONT SPEC 4OZ CLIKSEAL STRL BL (MISCELLANEOUS) ×2 IMPLANT
CONT SPEC 4OZ STRL OR WHT (MISCELLANEOUS) ×3
COVER WAND RF STERILE (DRAPES) ×1 IMPLANT
DECANTER SPIKE VIAL GLASS SM (MISCELLANEOUS) ×3 IMPLANT
DIFFUSER DRILL AIR PNEUMATIC (MISCELLANEOUS) ×3 IMPLANT
DRAPE LAPAROTOMY 100X72X124 (DRAPES) ×3 IMPLANT
DRAPE MICROSCOPE LEICA (MISCELLANEOUS) ×3 IMPLANT
DRAPE SURG 17X23 STRL (DRAPES) ×3 IMPLANT
DRSG MEPILEX BORDER 4X4 (GAUZE/BANDAGES/DRESSINGS) ×1 IMPLANT
DRSG OPSITE POSTOP 3X4 (GAUZE/BANDAGES/DRESSINGS) ×3 IMPLANT
DURAPREP 26ML APPLICATOR (WOUND CARE) ×3 IMPLANT
ELECT REM PT RETURN 9FT ADLT (ELECTROSURGICAL) ×3
ELECTRODE REM PT RTRN 9FT ADLT (ELECTROSURGICAL) ×1 IMPLANT
GAUZE 4X4 16PLY RFD (DISPOSABLE) IMPLANT
GLOVE BIOGEL PI IND STRL 7.5 (GLOVE) ×2 IMPLANT
GLOVE BIOGEL PI INDICATOR 7.5 (GLOVE) ×2
GLOVE ECLIPSE 7.5 STRL STRAW (GLOVE) ×3 IMPLANT
GLOVE ECLIPSE 9.0 STRL (GLOVE) ×2 IMPLANT
GLOVE EXAM NITRILE XL STR (GLOVE) IMPLANT
GOWN STRL REUS W/ TWL LRG LVL3 (GOWN DISPOSABLE) ×2 IMPLANT
GOWN STRL REUS W/ TWL XL LVL3 (GOWN DISPOSABLE) IMPLANT
GOWN STRL REUS W/TWL 2XL LVL3 (GOWN DISPOSABLE) IMPLANT
GOWN STRL REUS W/TWL LRG LVL3 (GOWN DISPOSABLE)
GOWN STRL REUS W/TWL XL LVL3 (GOWN DISPOSABLE) ×9
HEMOSTAT POWDER KIT SURGIFOAM (HEMOSTASIS) ×3 IMPLANT
KIT BASIN OR (CUSTOM PROCEDURE TRAY) ×3 IMPLANT
KIT TURNOVER KIT B (KITS) ×3 IMPLANT
NDL HYPO 18GX1.5 BLUNT FILL (NEEDLE) IMPLANT
NDL SPNL 18GX3.5 QUINCKE PK (NEEDLE) IMPLANT
NEEDLE HYPO 18GX1.5 BLUNT FILL (NEEDLE) ×3 IMPLANT
NEEDLE HYPO 22GX1.5 SAFETY (NEEDLE) ×3 IMPLANT
NEEDLE SPNL 18GX3.5 QUINCKE PK (NEEDLE) ×3 IMPLANT
NS IRRIG 1000ML POUR BTL (IV SOLUTION) ×3 IMPLANT
OIL CARTRIDGE MAESTRO DRILL (MISCELLANEOUS) ×3
PACK LAMINECTOMY NEURO (CUSTOM PROCEDURE TRAY) ×3 IMPLANT
PAD ARMBOARD 7.5X6 YLW CONV (MISCELLANEOUS) ×17 IMPLANT
SPONGE LAP 4X18 RFD (DISPOSABLE) IMPLANT
SPONGE SURGIFOAM ABS GEL SZ50 (HEMOSTASIS) ×1 IMPLANT
STRIP CLOSURE SKIN 1/2X4 (GAUZE/BANDAGES/DRESSINGS) ×2 IMPLANT
SUT MNCRL AB 4-0 PS2 18 (SUTURE) ×2 IMPLANT
SUT MON AB 3-0 SH 27 (SUTURE) ×3
SUT MON AB 3-0 SH27 (SUTURE) ×1 IMPLANT
SUT VIC AB 0 CT1 18XCR BRD8 (SUTURE) ×1 IMPLANT
SUT VIC AB 0 CT1 8-18 (SUTURE) ×3
SUT VIC AB 2-0 CP2 18 (SUTURE) ×2 IMPLANT
SUT VIC AB 2-0 CT1 18 (SUTURE) IMPLANT
SYR 3ML LL SCALE MARK (SYRINGE) ×2 IMPLANT
TOWEL GREEN STERILE (TOWEL DISPOSABLE) ×3 IMPLANT
TOWEL GREEN STERILE FF (TOWEL DISPOSABLE) ×3 IMPLANT
WATER STERILE IRR 1000ML POUR (IV SOLUTION) ×3 IMPLANT

## 2019-05-24 NOTE — Anesthesia Preprocedure Evaluation (Signed)
Anesthesia Evaluation  Patient identified by MRN, date of birth, ID band Patient awake    Reviewed: Allergy & Precautions, NPO status , Patient's Chart, lab work & pertinent test results  Airway Mallampati: II  TM Distance: >3 FB Neck ROM: Full    Dental no notable dental hx.    Pulmonary neg pulmonary ROS, Patient abstained from smoking., former smoker,    Pulmonary exam normal breath sounds clear to auscultation       Cardiovascular hypertension, Normal cardiovascular exam Rhythm:Regular Rate:Normal     Neuro/Psych negative neurological ROS  negative psych ROS   GI/Hepatic negative GI ROS, Neg liver ROS,   Endo/Other  negative endocrine ROS  Renal/GU negative Renal ROS  negative genitourinary   Musculoskeletal Charcot Marie Tooth muscular atrophy   Abdominal   Peds negative pediatric ROS (+)  Hematology negative hematology ROS (+)   Anesthesia Other Findings   Reproductive/Obstetrics negative OB ROS                             Anesthesia Physical Anesthesia Plan  ASA: III  Anesthesia Plan: General   Post-op Pain Management:    Induction: Intravenous  PONV Risk Score and Plan: 2 and Ondansetron and Dexamethasone  Airway Management Planned: Oral ETT  Additional Equipment:   Intra-op Plan:   Post-operative Plan: Extubation in OR  Informed Consent: I have reviewed the patients History and Physical, chart, labs and discussed the procedure including the risks, benefits and alternatives for the proposed anesthesia with the patient or authorized representative who has indicated his/her understanding and acceptance.     Dental advisory given  Plan Discussed with: CRNA and Surgeon  Anesthesia Plan Comments:         Anesthesia Quick Evaluation

## 2019-05-24 NOTE — Anesthesia Procedure Notes (Signed)
Procedure Name: Intubation Date/Time: 05/24/2019 7:55 AM Performed by: Colin Benton, CRNA Pre-anesthesia Checklist: Patient identified, Emergency Drugs available, Suction available and Patient being monitored Patient Re-evaluated:Patient Re-evaluated prior to induction Oxygen Delivery Method: Circle system utilized Preoxygenation: Pre-oxygenation with 100% oxygen Induction Type: IV induction Ventilation: Mask ventilation without difficulty and Oral airway inserted - appropriate to patient size Laryngoscope Size: Sabra Heck and 2 Grade View: Grade II Tube type: Oral Tube size: 7.5 mm Number of attempts: 1 Airway Equipment and Method: Stylet Placement Confirmation: ETT inserted through vocal cords under direct vision,  positive ETCO2 and breath sounds checked- equal and bilateral Secured at: 23 cm Tube secured with: Tape Dental Injury: Teeth and Oropharynx as per pre-operative assessment

## 2019-05-24 NOTE — Anesthesia Postprocedure Evaluation (Signed)
Anesthesia Post Note  Patient: Juan Yang  Procedure(s) Performed: LAMINECTOMY AND FORAMINOTOMY LUMBAR FIVE- SACRAL ONE RIGHT (Right Spine Lumbar)     Patient location during evaluation: PACU Anesthesia Type: General Level of consciousness: awake and alert Pain management: pain level controlled Vital Signs Assessment: post-procedure vital signs reviewed and stable Respiratory status: spontaneous breathing, nonlabored ventilation, respiratory function stable and patient connected to nasal cannula oxygen Cardiovascular status: blood pressure returned to baseline and stable Postop Assessment: no apparent nausea or vomiting Anesthetic complications: no    Last Vitals:  Vitals:   05/24/19 1029 05/24/19 1037  BP: 114/89 121/88  Pulse: 73 77  Resp: 14 14  Temp: (!) 36.2 C (!) 36.4 C  SpO2: 99% 98%    Last Pain:  Vitals:   05/24/19 1037  TempSrc:   PainSc: 3                  [name]

## 2019-05-24 NOTE — H&P (Signed)
Chief Complaint   No chief complaint on file.   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  He has chronic numbness in bilateral lower legs and feet from his peripheral neuropathy.  The pain has been worsening despite nonsurgical measures such as therapy and injections.  Past Medical History   Past Medical History:  Diagnosis Date  . Arthritis   . 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 Charcot-Marie-Tooth disease type 2   . Dyspnea   . Guaiac + stool 10/29/2016  . History of kidney stones   . Hypertension   . Peripheral neuropathy 10/29/2016    Past Surgical History   Past Surgical History:  Procedure Laterality Date  . COLONOSCOPY WITH PROPOFOL N/A 11/14/2016   Procedure: COLONOSCOPY WITH PROPOFOL;  Surgeon: Rogene Houston, MD;  Location: [facility];  Service: Endoscopy;  Laterality: N/A;  9:15  . POLYPECTOMY  11/14/2016   Procedure: POLYPECTOMY- SIGMOID COLON X3 TRANSVERSE COLON X2;  Surgeon: Rogene Houston, MD;  Location: [facility];  Service: Endoscopy;;  . rt ankle surgery     Titanium hinge  . TONSILLECTOMY      Social History   Social History   Tobacco Use  . Smoking status: Former Smoker    Packs/day: 1.50    Years: 20.00    Pack years: 30.00    Types: Cigarettes    Quit date: 11/12/2012    Years since quitting: 6.5  . Smokeless tobacco: Never Used  Substance Use Topics  . Alcohol use: No  . Drug use: No    Medications   Prior to Admission medications   Medication Sig Start Date End Date Taking? Authorizing Provider  acetaminophen-codeine (TYLENOL #3) 300-30 MG tablet Take 1 tablet by mouth every 4 (four) hours as needed for moderate pain.  05/06/19  Yes [provider]  amLODipine (NORVASC) 2.5 MG tablet Take 2.5 mg by mouth 2 (two) times daily. 04/18/19  Yes [provider]  baclofen (LIORESAL) 10 MG tablet Take 10 mg by mouth 2 (two) times daily.   Yes [provider]  buPROPion (WELLBUTRIN XL) 300 MG 24 hr tablet Take 300 mg by mouth daily.  05/12/19  Yes [provider]  clobetasol cream (TEMOVATE) 8.29 % Apply 1 application topically 2 (two) times daily as needed (for skin).  08/21/16  Yes [provider]  gabapentin (NEURONTIN) 300 MG capsule Take 300 mg by mouth 3 (three) times daily. Additional 300 mg if needed for pain 10/20/16  Yes [provider]  Glucosamine HCl-MSM (GLUCOSAMINE-MSM PO) Take 1,500 mg by mouth daily.   Yes [provider]  metoprolol succinate (TOPROL-XL) 25 MG 24 hr tablet Take 12.5 mg by mouth daily. 04/21/19  Yes [provider]  Omega-3 1000 MG CAPS Take 1,000 mg by mouth daily.   Yes [provider]  Potassium 99 MG TABS Take 99 mg by mouth daily.   Yes [provider]  albuterol (VENTOLIN HFA) 108 (90 Base) MCG/ACT inhaler Inhale 2 puffs into the lungs every 4 (four) hours as needed. 12/21/18   [provider]  fluticasone (CUTIVATE) 0.05 % cream Apply 1 application topically 2 (two) times daily as needed (for skin).  08/21/16   [provider]  NUCYNTA 100 MG TABS Take 100 mg by  mouth 3 (three) times daily. 10/29/16   [provider]    Allergies   Allergies  Allergen Reactions  . Eggs Or Egg-Derived Products Swelling    Duck eggs Sneezing  . Atenolol Diarrhea and Nausea Only  . Cymbalta [Duloxetine Hcl] Other (See Comments)    Pt states it shut his bladder down and could not urinate; states it made him lethargic as well.  Marland Kitchen Penicillins Hives    Did it involve swelling of the face/tongue/throat, SOB, or low BP? Unknown Did it involve sudden or severe rash/hives, skin peeling, or any reaction on the inside of your mouth or nose? Unknown Did you need to seek medical attention at a hospital or doctor's office? yes When did  it last happen?Childhood If all above answers are "NO", may proceed with cephalosporin use.    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

## 2019-05-24 NOTE — Transfer of Care (Signed)
Immediate Anesthesia Transfer of Care Note  Patient: Juan Yang  Procedure(s) Performed: LAMINECTOMY AND FORAMINOTOMY LUMBAR FIVE- SACRAL ONE RIGHT (Right Spine Lumbar)  Patient Location: PACU  Anesthesia Type:General  Level of Consciousness: awake, alert , oriented and patient cooperative  Airway & Oxygen Therapy: Patient Spontanous Breathing  Post-op Assessment: Report given to RN, Post -op Vital signs reviewed and stable and Patient moving all extremities X 4  Post vital signs: Reviewed and stable  Last Vitals:  Vitals Value Taken Time  BP    Temp    Pulse 80 05/24/19 1000  Resp 12 05/24/19 1000  SpO2 100 % 05/24/19 1000  Vitals shown include unvalidated device data.  Last Pain:  Vitals:   05/24/19 0609  TempSrc:   PainSc: 2       Patients Stated Pain Goal: 2 (97/67/34 1937)  Complications: No apparent anesthesia complications

## 2019-05-24 NOTE — Progress Notes (Signed)
Neurosurgery postop check  Awake, alert, NAD 5/5 strength proximally. DF 4/5, EHL 3/5, PF 4+/5-- stable from preop  - d/c once ambulatory

## 2019-05-24 NOTE — Op Note (Signed)
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 Right L5-S1 laminotomy, foraminotomy, medial facetectomy and excision of synovial cyst 2.  Use of intraoperative microscope for microdissection  SURGEON: Dr. Duffy Rhody, MD  ASSISTANT: Dr. Earnie Larsson.  Please note, there were no qualified trainees available to assist with the procedure.  An assistant was required for aid in retraction of the neural elements.   ANESTHESIA: General Endotracheal  EBL: 50 ml  SPECIMENS: spinal cyst  DRAINS: None  COMPLICATIONS: none  CONDITION: Stable to PCAU  HISTORY: Juan Yang is a 64 y.o. male with Charcot-Marie-Tooth disease and severe peripheral neuropathy who presented to the clinic for evaluation of right buttock and leg pain that had progressed despite physical therapy and injections.  He was found to have a L5-S1 synovial cyst on the right side with severe foraminal stenosis as well.  He had moderate degenerative stenosis elsewhere.  I had a long discussion with the patient regarding treatment options.  Given he did not have overt instability as well as his significant degenerative disease at other levels, I recommended decompression with laminotomy, foraminotomies and excision of synovial cyst as an option.  Risks, benefits, alternatives, and expected convalescence were discussed with the patient.  He wished to proceed with surgery.  Informed consent was obtained.    PROCEDURE IN DETAIL: After informed consent was obtained and witnessed, the patient was brought to the operating room. After induction of general anesthesia, the patient was positioned on the operative table in a Loadholt frame in the prone position with all pressure points meticulously padded. The skin of the low back was then prepped and draped in the usual sterile fashion.  Using x-ray, the correct levels were identified and marked out on the skin, and after timeout  was conducted, the skin was infiltrated with local anesthetic. Skin incision was then made sharply and Bovie electrocautery was used to dissect the subcutaneous tissue until the lumbodorsal fascia was identified. The fascia was then incised on the right side using Bovie electrocautery and the lamina at the L5 and S1 levels was identified and dissection was carried out in the subperiosteal plane. Self-retaining retractor was then placed, and intraoperative x-ray was taken to confirm we were at the correct levels.  The microscope was then introduced in the field to allow for intraoperative micro dissection  Using a high-speed drill, right L5-S1 laminotomy and medial facetectomy was performed. The ligamentum flavum was then identified and removed and the l descending S1 nerve root was identified which was displaced medially by a large synovial cyst arising from the superior aspect of the L5-S1 joint.  There was a fair amount of inflammation with engorged epidural veins in this area.  The cyst was dissected from the nerve root and then truncated at its origin from the joint.  Additional foraminotomies was performed at L5-S1 with good decompression confirmed with easy passage of nerve hook.  The S1 nerve root was also followed down to its exiting foramina and overgrown facet joint was rongeured away to maximize decompression of the nerve root..The decompression was confirmed using a small ball tip dissector.  Meticulous hemostasis was obtained and the wound was irrigated thoroughly with bacitracin impregnated irrigation.  Depo-Medrol was placed over the nerve root.  Self-retaining retractor was then removed, and Marcaine was injected into the paraspinous musculature and subcutaneous tissues.  The wound was closed in layers using a combination of interrupted 0 Vicryl and 2-0 Vicryl stitches  followed by 4-0 monocryl in subcuticular manner and mastisol and steri-strips.  A sterile dressing was placed.   At the end of  the case all sponge, needle, and instrument counts were correct. The patient was then transferred to the stretcher and taken to the postanesthesia care unit in stable hemodynamic condition.

## 2019-05-25 LAB — SURGICAL PATHOLOGY

## 2019-07-13 DIAGNOSIS — R69 Illness, unspecified: Secondary | ICD-10-CM | POA: Diagnosis not present

## 2019-07-13 DIAGNOSIS — Z6830 Body mass index (BMI) 30.0-30.9, adult: Secondary | ICD-10-CM | POA: Diagnosis not present

## 2019-07-13 DIAGNOSIS — I10 Essential (primary) hypertension: Secondary | ICD-10-CM | POA: Diagnosis not present

## 2019-07-13 DIAGNOSIS — M1712 Unilateral primary osteoarthritis, left knee: Secondary | ICD-10-CM | POA: Diagnosis not present

## 2019-07-13 DIAGNOSIS — Z299 Encounter for prophylactic measures, unspecified: Secondary | ICD-10-CM | POA: Diagnosis not present

## 2019-07-13 DIAGNOSIS — N183 Chronic kidney disease, stage 3 unspecified: Secondary | ICD-10-CM | POA: Diagnosis not present

## 2019-09-08 DIAGNOSIS — G629 Polyneuropathy, unspecified: Secondary | ICD-10-CM | POA: Diagnosis not present

## 2019-09-08 DIAGNOSIS — I6621 Occlusion and stenosis of right posterior cerebral artery: Secondary | ICD-10-CM | POA: Diagnosis not present

## 2019-09-08 DIAGNOSIS — R251 Tremor, unspecified: Secondary | ICD-10-CM | POA: Diagnosis not present

## 2019-09-08 DIAGNOSIS — G459 Transient cerebral ischemic attack, unspecified: Secondary | ICD-10-CM | POA: Diagnosis not present

## 2019-09-08 DIAGNOSIS — Z20822 Contact with and (suspected) exposure to covid-19: Secondary | ICD-10-CM | POA: Diagnosis not present

## 2019-09-08 DIAGNOSIS — R2681 Unsteadiness on feet: Secondary | ICD-10-CM | POA: Diagnosis not present

## 2019-09-08 DIAGNOSIS — Z87891 Personal history of nicotine dependence: Secondary | ICD-10-CM | POA: Diagnosis not present

## 2019-09-08 DIAGNOSIS — R4781 Slurred speech: Secondary | ICD-10-CM | POA: Diagnosis not present

## 2019-09-08 DIAGNOSIS — J449 Chronic obstructive pulmonary disease, unspecified: Secondary | ICD-10-CM | POA: Diagnosis not present

## 2019-09-08 DIAGNOSIS — R531 Weakness: Secondary | ICD-10-CM | POA: Diagnosis not present

## 2019-09-08 DIAGNOSIS — Z880 Allergy status to penicillin: Secondary | ICD-10-CM | POA: Diagnosis not present

## 2019-09-08 DIAGNOSIS — I10 Essential (primary) hypertension: Secondary | ICD-10-CM | POA: Diagnosis not present

## 2019-09-08 DIAGNOSIS — Z91012 Allergy to eggs: Secondary | ICD-10-CM | POA: Diagnosis not present

## 2019-09-08 DIAGNOSIS — G8929 Other chronic pain: Secondary | ICD-10-CM | POA: Diagnosis not present

## 2019-09-08 DIAGNOSIS — F419 Anxiety disorder, unspecified: Secondary | ICD-10-CM | POA: Diagnosis not present

## 2019-09-08 DIAGNOSIS — R69 Illness, unspecified: Secondary | ICD-10-CM | POA: Diagnosis not present

## 2019-09-09 DIAGNOSIS — G459 Transient cerebral ischemic attack, unspecified: Secondary | ICD-10-CM | POA: Diagnosis not present

## 2019-09-09 DIAGNOSIS — R69 Illness, unspecified: Secondary | ICD-10-CM | POA: Diagnosis not present

## 2019-09-09 DIAGNOSIS — R251 Tremor, unspecified: Secondary | ICD-10-CM | POA: Diagnosis not present

## 2019-09-11 ENCOUNTER — Ambulatory Visit (HOSPITAL_COMMUNITY)
Admission: EM | Admit: 2019-09-11 | Discharge: 2019-09-11 | Disposition: A | Discharge status: Home / Self Care | Payer: Medicare HMO | Attending: Family Medicine | Admitting: Family Medicine

## 2019-09-11 ENCOUNTER — Encounter (HOSPITAL_COMMUNITY): Payer: Self-pay

## 2019-09-11 ENCOUNTER — Other Ambulatory Visit: Payer: Self-pay

## 2019-09-11 ENCOUNTER — Ambulatory Visit (INDEPENDENT_AMBULATORY_CARE_PROVIDER_SITE_OTHER): Payer: Medicare HMO

## 2019-09-11 DIAGNOSIS — S2241XD Multiple fractures of ribs, right side, subsequent encounter for fracture with routine healing: Secondary | ICD-10-CM | POA: Diagnosis not present

## 2019-09-11 DIAGNOSIS — S299XXA Unspecified injury of thorax, initial encounter: Secondary | ICD-10-CM | POA: Diagnosis not present

## 2019-09-11 DIAGNOSIS — R0781 Pleurodynia: Secondary | ICD-10-CM

## 2019-09-11 MED ORDER — KETOROLAC TROMETHAMINE 30 MG/ML IJ SOLN
INTRAMUSCULAR | Status: AC
  Filled 2019-09-11: qty 1

## 2019-09-11 MED ORDER — KETOROLAC TROMETHAMINE 30 MG/ML IJ SOLN
30.0000 mg | Freq: Once | INTRAMUSCULAR | Status: AC
  Administered 2019-09-11: 30 mg via INTRAMUSCULAR

## 2019-09-11 MED ORDER — LIDOCAINE 5 % EX PTCH: 1.0000 | MEDICATED_PATCH | Freq: Two times a day (BID) | CUTANEOUS | 2 refills | Status: DC

## 2019-09-11 NOTE — ED Triage Notes (Signed)
Patient reports he had a TIA last week. States when he fell he hit his right ribcage on the edge of a desk. Also requesting his big toe be looked at d/t ingrown toenail.

## 2019-09-11 NOTE — Discharge Instructions (Signed)
Please try heat  Please try compression  Please try neosporin on the toe  Please follow up if your symptoms fail to improve.

## 2019-09-11 NOTE — ED Provider Notes (Signed)
[facility]    CSN: [id number] Arrival date & time: 09/11/19  1720      History   Chief Complaint Chief Complaint  Patient presents with  . Rib Injury    HPI Juan Yang is a 64 y.o. male.   He is presenting with right rib pain.  He will had a fall last week where he hit the side of a cabinet in the right mid axillary space.  He denies any bruising but has pain in that area.  He was admitted for a TIA but had no imaging of this area.  It tends to occur regularly and be severe in nature.  He denies any shortness of breath.  He has tried pain medication with no improvement of his symptoms.  HPI  Past Medical History:  Diagnosis Date  . Arthritis   . 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 Charcot-Marie-Tooth disease type 2   . Dyspnea   . Guaiac + stool 10/29/2016  . History of kidney stones   . Hypertension   . Peripheral neuropathy 10/29/2016    Patient Active Problem List   Diagnosis Date Noted  . Guaiac positive stools 10/30/2016  . Guaiac + stool 10/29/2016  . 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 Peripheral neuropathy 10/29/2016    Past Surgical History:  Procedure Laterality Date  . COLONOSCOPY WITH PROPOFOL N/A 11/14/2016   Procedure: COLONOSCOPY WITH PROPOFOL;  Surgeon: Rogene Houston, MD;  Location: [facility];  Service: Endoscopy;  Laterality: N/A;  9:15  . LUMBAR LAMINECTOMY/DECOMPRESSION MICRODISCECTOMY Right 05/24/2019   Procedure: LAMINECTOMY AND FORAMINOTOMY LUMBAR FIVE- SACRAL ONE RIGHT;  Surgeon: Vallarie Mare, MD;  Location: [facility];  Service: Neurosurgery;  Laterality: Right;  posterior/right  . POLYPECTOMY  11/14/2016   Procedure: POLYPECTOMY- SIGMOID COLON X3 TRANSVERSE COLON X2;  Surgeon: Rogene Houston, MD;  Location: [facility];  Service: Endoscopy;;  . rt ankle surgery     Titanium hinge  . TONSILLECTOMY         Home Medications    Prior to Admission medications   Medication Sig Start  Date End Date Taking? Authorizing Provider  albuterol (VENTOLIN HFA) 108 (90 Base) MCG/ACT inhaler Inhale 2 puffs into the lungs every 4 (four) hours as needed. 12/21/18   [provider]  amLODipine (NORVASC) 2.5 MG tablet Take 2.5 mg by mouth 2 (two) times daily. 04/18/19   [provider]  baclofen (LIORESAL) 10 MG tablet Take 10 mg by mouth 2 (two) times daily.    [provider]  buPROPion (WELLBUTRIN XL) 300 MG 24 hr tablet Take 300 mg by mouth daily.  05/12/19   [provider]  clobetasol cream (TEMOVATE) 1.74 % Apply 1 application topically 2 (two) times daily as needed (for skin).  08/21/16   [provider]  fluticasone (CUTIVATE) 0.05 % cream Apply 1 application topically 2 (two) times daily as needed (for skin).  08/21/16   [provider]  gabapentin (NEURONTIN) 300 MG capsule Take 300 mg by mouth 3 (three) times daily. Additional 300 mg if needed for pain 10/20/16   [provider]  Glucosamine HCl-MSM (GLUCOSAMINE-MSM PO) Take 1,500 mg by mouth daily.    [provider]  lidocaine (LIDODERM) 5 % Place 1 patch onto the skin every 12 (twelve) hours. Remove & Discard patch within 12 hours or as directed by MD 09/11/19   Rosemarie Ax, MD  metoprolol succinate (TOPROL-XL) 25 MG  24 hr tablet Take 12.5 mg by mouth daily. 04/21/19   [provider]  NUCYNTA 100 MG TABS Take 100 mg by mouth 3 (three) times daily. 10/29/16   [provider]  Omega-3 1000 MG CAPS Take 1,000 mg by mouth daily.    [provider]  Potassium 99 MG TABS Take 99 mg by mouth daily.    [provider]    Family History History reviewed. No pertinent family history.  Social History Social History   Tobacco Use  . Smoking status: Former Smoker    Packs/day: 1.50    Years: 20.00    Pack years: 30.00    Types: Cigarettes    Quit date: 11/12/2012    Years since quitting: 6.8  . Smokeless tobacco: Never Used   Vaping Use  . Vaping Use: Some days  Substance Use Topics  . Alcohol use: No  . Drug use: No     Allergies   Eggs or egg-derived products, Atenolol, Cymbalta [duloxetine hcl], and Penicillins   Review of Systems Review of Systems  See HPI  Physical Exam Triage Vital Signs ED Triage Vitals  Enc Vitals Group     BP 09/11/19 1833 (!) 138/89     Pulse Rate 09/11/19 1833 90     Resp 09/11/19 1833 14     Temp 09/11/19 1833 99 F (37.2 C)     Temp src --      SpO2 09/11/19 1833 99 %     Weight --      Height --      Head Circumference --      Peak Flow --      Pain Score 09/11/19 1830 9     Pain Loc --      Pain Edu? --      Excl. in [place]? --    No data found.  Updated Vital Signs BP (!) 138/89   Pulse 90   Temp 99 F (37.2 C)   Resp 14   SpO2 99%   Visual Acuity Right Eye Distance:   Left Eye Distance:   Bilateral Distance:    Right Eye Near:   Left Eye Near:    Bilateral Near:     Physical Exam Gen: NAD, alert, cooperative with exam, well-appearing ENT: normal lips, normal nasal mucosa,  Eye: normal EOM, normal conjunctiva and lids Resp: no accessory muscle use, non-labored,   Skin: no rashes, no areas of induration  Neuro: normal tone, normal sensation to touch Psych:  normal insight, alert and oriented MSK:  Right rib: No crepitus: Audible pop heard with inspiration. Tenderness palpation over the mid axillary region between the ninth and 10th rib. Neurovascularly intact   UC Treatments / Results  Labs (all labs ordered are listed, but only abnormal results are displayed) Labs Reviewed - No data to display  EKG   Radiology DG Ribs Unilateral W/Chest Right  Result Date: 09/11/2019 CLINICAL DATA:  Right rib pain. Fall striking corner of desk last week. EXAM: RIGHT RIBS AND CHEST - 3+ VIEW COMPARISON:  None. FINDINGS: No evidence of acute right rib fracture. There are remote healed fractures of the lateral right sixth and seventh ribs. There  is no evidence of pneumothorax or pleural effusion. Both lungs are clear. Heart size and mediastinal contours are within normal limits. IMPRESSION: No evidence of acute right rib fracture or pulmonary complication. Electronically Signed   By: Keith Rake M.D.   On: 09/11/2019 19:16  Procedures Procedures (including critical care time)  Medications Ordered in UC Medications  ketorolac (TORADOL) 30 MG/ML injection 30 mg (30 mg Intramuscular Given 09/11/19 1854)    Initial Impression / Assessment and Plan / UC Course  I have reviewed the triage vital signs and the nursing notes.  Pertinent labs & imaging results that were available during my care of the patient were reviewed by me and considered in my medical decision making (see chart for details).     Mr. Willcutt is a 64 year old male that is presenting with right rib pain.  Imaging was unrevealing for fracture.  Had a trauma so fracture could still be occurring.  Provided Lidoderm patches.  Counseled supportive care.  Given indications to follow-up.  Final Clinical Impressions(s) / UC Diagnoses   Final diagnoses:  Rib pain on right side     Discharge Instructions     Please try heat  Please try compression  Please try neosporin on the toe  Please follow up if your symptoms fail to improve.     ED Prescriptions    Medication Sig Dispense Auth. Provider   lidocaine (LIDODERM) 5 % Place 1 patch onto the skin every 12 (twelve) hours. Remove & Discard patch within 12 hours or as directed by MD 30 patch Rosemarie Ax, MD     PDMP not reviewed this encounter.   Rosemarie Ax, MD 09/11/19 [phone]

## 2019-09-12 DIAGNOSIS — M79671 Pain in right foot: Secondary | ICD-10-CM | POA: Diagnosis not present

## 2019-09-12 DIAGNOSIS — M79674 Pain in right toe(s): Secondary | ICD-10-CM | POA: Diagnosis not present

## 2019-09-12 DIAGNOSIS — L600 Ingrowing nail: Secondary | ICD-10-CM | POA: Diagnosis not present

## 2019-09-12 DIAGNOSIS — L03031 Cellulitis of right toe: Secondary | ICD-10-CM | POA: Diagnosis not present

## 2019-09-14 DIAGNOSIS — Z299 Encounter for prophylactic measures, unspecified: Secondary | ICD-10-CM | POA: Diagnosis not present

## 2019-09-14 DIAGNOSIS — N183 Chronic kidney disease, stage 3 unspecified: Secondary | ICD-10-CM | POA: Diagnosis not present

## 2019-09-14 DIAGNOSIS — R079 Chest pain, unspecified: Secondary | ICD-10-CM | POA: Diagnosis not present

## 2019-09-14 DIAGNOSIS — J449 Chronic obstructive pulmonary disease, unspecified: Secondary | ICD-10-CM | POA: Diagnosis not present

## 2019-09-14 DIAGNOSIS — M549 Dorsalgia, unspecified: Secondary | ICD-10-CM | POA: Diagnosis not present

## 2019-09-14 DIAGNOSIS — I10 Essential (primary) hypertension: Secondary | ICD-10-CM | POA: Diagnosis not present

## 2019-09-14 DIAGNOSIS — Z6830 Body mass index (BMI) 30.0-30.9, adult: Secondary | ICD-10-CM | POA: Diagnosis not present

## 2019-09-20 ENCOUNTER — Encounter: Payer: Self-pay | Admitting: Neurology

## 2019-09-20 DIAGNOSIS — M5416 Radiculopathy, lumbar region: Secondary | ICD-10-CM | POA: Diagnosis not present

## 2019-09-20 DIAGNOSIS — Z6829 Body mass index (BMI) 29.0-29.9, adult: Secondary | ICD-10-CM | POA: Diagnosis not present

## 2019-09-20 DIAGNOSIS — I10 Essential (primary) hypertension: Secondary | ICD-10-CM | POA: Diagnosis not present

## 2019-10-06 DIAGNOSIS — A281 Cat-scratch disease: Secondary | ICD-10-CM | POA: Diagnosis not present

## 2019-10-06 DIAGNOSIS — Z299 Encounter for prophylactic measures, unspecified: Secondary | ICD-10-CM | POA: Diagnosis not present

## 2019-10-06 DIAGNOSIS — R202 Paresthesia of skin: Secondary | ICD-10-CM | POA: Diagnosis not present

## 2019-10-06 DIAGNOSIS — J449 Chronic obstructive pulmonary disease, unspecified: Secondary | ICD-10-CM | POA: Diagnosis not present

## 2019-10-06 NOTE — Progress Notes (Signed)
NEUROLOGY CONSULTATION NOTE  HICKS FEICK MRN: [id number] DOB: 18-May-1955  Referring provider: Monico Blitz, MD Primary care provider: Monico Blitz, MD  Reason for consult:  TIA  HISTORY OF PRESENT ILLNESS: Juan Yang. 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  History supplemented by hospital and referring provider's notes.  He is accompanied by his wife who supplements history.  On 09/08/2019 he developed some confusion, which gradually progressed by the evening.  He wasn't making sense.  He made bizarre statements.  Speech was slurred.  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  He has had tremors for a few years which became worse.  He had hallucinations, thinking that he saw people outside of the house.  In hindsight, he does not remember everything that happened.  A lot of it feels like a dream.  He presented to the ED at [facility] where he was admitted for concern of TIA.  CT of head and later MRI of brain snowed no acute intracranial abnormality.  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  UA negative.  UDS positive for cannabinoids, opiates and oxycodone, which he treats for chronic neuropathic pain.  Serum alcohol was negative.  Viral panel was negative.    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  He was discharged on ASA 325mg  daily.  CMP alp 131, cr 1.2, BUN 21  He has Charcot Marie Tooth disease, diagnosed in 2006.  Both his mother and brother had CMT.  It has gradually progressed over the years.  He staggers and uses a cane.  He also has history of lumbar spinal stenosis and underwent surgery in April.   PAST MEDICAL HISTORY: Past Medical History:  Diagnosis Date  . Arthritis   . 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 Charcot-Marie-Tooth disease type  2   . Dyspnea   . Guaiac + stool 10/29/2016  . History of kidney stones   . Hypertension   . Peripheral neuropathy 10/29/2016    PAST SURGICAL HISTORY: Past Surgical History:  Procedure Laterality Date  . COLONOSCOPY WITH PROPOFOL N/A 11/14/2016   Procedure: COLONOSCOPY WITH PROPOFOL;  Surgeon: Rogene Houston, MD;  [facility];  Service: Endoscopy;  Laterality: N/A;  9:15  . LUMBAR LAMINECTOMY/DECOMPRESSION MICRODISCECTOMY Right 05/24/2019   Procedure: LAMINECTOMY AND FORAMINOTOMY LUMBAR FIVE- SACRAL ONE RIGHT;  Surgeon: Vallarie Mare, MD;  Location: [place];  Service: Neurosurgery;  Laterality: Right;  posterior/right  . POLYPECTOMY  11/14/2016   Procedure: POLYPECTOMY- SIGMOID COLON X3 TRANSVERSE COLON X2;  Surgeon: Rogene Houston, MD;  Location[facility];  Service: Endoscopy;;  . rt ankle surgery     Titanium hinge  . TONSILLECTOMY      MEDICATIONS: Current Outpatient Medications on File Prior to Visit  Medication Sig Dispense Refill  . albuterol (VENTOLIN HFA) 108 (90 Base) MCG/ACT inhaler Inhale 2 puffs into the lungs every 4 (four) hours as needed.    Marland Kitchen amLODipine (NORVASC) 2.5 MG tablet Take 2.5 mg by mouth 2 (two) times daily.    . baclofen (LIORESAL) 10 MG tablet Take 10 mg by mouth 2 (two) times daily.    Marland Kitchen buPROPion (WELLBUTRIN XL) 300 MG 24 hr tablet Take 300 mg by mouth daily.     . clobetasol cream (TEMOVATE)  5.88 % Apply 1 application topically 2 (two) times daily as needed (for skin).     . fluticasone (CUTIVATE) 0.05 % cream Apply 1 application topically 2 (two) times daily as needed (for skin).     Marland Kitchen gabapentin (NEURONTIN) 300 MG capsule Take 300 mg by mouth 3 (three) times daily. Additional 300 mg if needed for pain    . Glucosamine HCl-MSM (GLUCOSAMINE-MSM PO) Take 1,500 mg by mouth daily.    Marland Kitchen lidocaine (LIDODERM) 5 % Place 1 patch onto the skin every 12 (twelve) hours. Remove & Discard patch within 12 hours or as directed by MD 30 patch 2  .  metoprolol succinate (TOPROL-XL) 25 MG 24 hr tablet Take 12.5 mg by mouth daily.    . NUCYNTA 100 MG TABS Take 100 mg by mouth 3 (three) times daily.    . Omega-3 1000 MG CAPS Take 1,000 mg by mouth daily.    . Potassium 99 MG TABS Take 99 mg by mouth daily.     No current facility-administered medications on file prior to visit.    ALLERGIES: Allergies  Allergen Reactions  . Eggs Or Egg-Derived Products Swelling    Duck eggs Sneezing  . Atenolol Diarrhea and Nausea Only  . Cymbalta [Duloxetine Hcl] Other (See Comments)    Pt states it shut his bladder down and could not urinate; states it made him lethargic as well.  Marland Kitchen Penicillins Hives    Did it involve swelling of the face/tongue/throat, SOB, or low BP? Unknown Did it involve sudden or severe rash/hives, skin peeling, or any reaction on the inside of your mouth or nose? Unknown Did you need to seek medical attention at a hospital or doctor's office? yes When did it last happen?Childhood If all above answers are "NO", may proceed with cephalosporin use.    FAMILY HISTORY: History reviewed. No pertinent family history.  SOCIAL HISTORY: Social History   Socioeconomic History  . Marital status: Married    Spouse name: Not on file  . Number of children: Not on file  . Years of education: Not on file  . Highest education level: Not on file  Occupational History  . Not on file  Tobacco Use  . Smoking status: Former Smoker    Packs/day: 1.50    Years: 20.00    Pack years: 30.00    Types: Cigarettes    Quit date: 11/12/2012    Years since quitting: 6.9  . Smokeless tobacco: Never Used  Vaping Use  . Vaping Use: Some days  Substance and Sexual Activity  . Alcohol use: No  . Drug use: No  . Sexual activity: Yes    Birth control/protection: None  Other Topics Concern  . Not on file  Social History Narrative  . Not on file   Social Determinants of Health   Financial Resource Strain:   . Difficulty of Paying  Living Expenses: Not on file  Food Insecurity:   . Worried About Charity fundraiser in the Last Year: Not on file  . Ran Out of Food in the Last Year: Not on file  Transportation Needs:   . Lack of Transportation (Medical): Not on file  . Lack of Transportation (Non-Medical): Not on file  Physical Activity:   . Days of Exercise per Week: Not on file  . Minutes of Exercise per Session: Not on file  Stress:   . Feeling of Stress : Not on file  Social Connections:   . Frequency of Communication  with Friends and Family: Not on file  . Frequency of Social Gatherings with Friends and Family: Not on file  . Attends Religious Services: Not on file  . Active Member of Clubs or Organizations: Not on file  . Attends Archivist Meetings: Not on file  . Marital Status: Not on file  Intimate Partner Violence:   . Fear of Current or Ex-Partner: Not on file  . Emotionally Abused: Not on file  . Physically Abused: Not on file  . Sexually Abused: Not on file    PHYSICAL EXAM: Blood pressure 132/87, pulse (!) 104, height 6' (1.829 m), weight 217 lb 3.2 oz (98.5 kg), SpO2 96 %. General: No acute distress.  Patient appears well-groomed.   Head:  Normocephalic/atraumatic Eyes:  fundi examined but not visualized Neck: supple, no paraspinal tenderness, full range of motion Back: No paraspinal tenderness Heart: regular rate and rhythm Lungs: Clear to auscultation bilaterally. Vascular: No carotid bruits. Neurological Exam: Mental status: alert and oriented to person, place, and time, recent and remote memory intact, fund of knowledge intact, attention and concentration intact, speech fluent and not dysarthric, language intact. Cranial nerves: CN I: not tested CN II: pupils equal, round and reactive to light, visual fields intact CN III, IV, VI:  full range of motion, no nystagmus, no ptosis CN V: facial sensation intact CN VII: upper and lower face symmetric CN VIII: hearing intact CN  IX, X: gag intact, uvula midline CN XI: sternocleidomastoid and trapezius muscles intact CN XII: tongue midline Bulk & Tone: normal, no fasciculations. Motor:  5-/5 bilateral ankle dorsiflexion, 4+/5 bilateral EHL, otherwise 5/5 throughout.  Fine postural and kinetic tremor in hands. Sensation:  Pinprick vibratory sensation reduced in feet up to below knees Deep Tendon Reflexes:  1+ upper extremities, absent lower extremities, toes downgoing.  Finger to nose testing:  Without dysmetria.  Heel to shin:  Without dysmetria.   Gait:  Wide-based, unsteady gait.  Romberg positive  IMPRESSION: 1.  Acute confusion/encephalopathy.  Unclear etiology.  I don't think it was a TIA. 2.  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  Charcot Marie Tooth disease  PLAN: 1.  EEG 2.  Check B12 and TSH 3.  Given the incidental PICA stenosis on MRA, would continue ASA daily. 4.  Follow up in 6 months.  Thank you for allowing me to take part in the care of this patient.  Metta Clines, DO  CC:  Monico Blitz, MD

## 2019-10-07 ENCOUNTER — Encounter: Payer: Self-pay | Admitting: Neurology

## 2019-10-07 ENCOUNTER — Other Ambulatory Visit (INDEPENDENT_AMBULATORY_CARE_PROVIDER_SITE_OTHER): Payer: Medicare HMO

## 2019-10-07 ENCOUNTER — Ambulatory Visit: Payer: Medicare HMO | Admitting: Neurology

## 2019-10-07 ENCOUNTER — Other Ambulatory Visit: Payer: Self-pay

## 2019-10-07 VITALS — BP 132/87 | HR 104 | Ht 72.0 in | Wt 217.2 lb

## 2019-10-07 DIAGNOSIS — G9340 Encephalopathy, unspecified: Secondary | ICD-10-CM | POA: Diagnosis not present

## 2019-10-07 DIAGNOSIS — G600 Hereditary motor and sensory neuropathy: Secondary | ICD-10-CM

## 2019-10-07 DIAGNOSIS — R251 Tremor, unspecified: Secondary | ICD-10-CM | POA: Diagnosis not present

## 2019-10-07 NOTE — Addendum Note (Signed)
Addended by: Kaylyn Lim I on: 10/07/2019 03:54 PM   Modules accepted: Orders

## 2019-10-07 NOTE — Patient Instructions (Signed)
1.  Will check routine EEG 2.  Will check B12 and TSH 3.  Follow up in 6 months.

## 2019-10-07 NOTE — Addendum Note (Signed)
Addended by: Kaylyn Lim I on: 10/07/2019 03:53 PM   Modules accepted: Orders

## 2019-10-08 LAB — TSH: TSH: 2.29 mIU/L (ref 0.40–4.50)

## 2019-10-08 LAB — VITAMIN B12: Vitamin B-12: 275 pg/mL (ref 200–1100)

## 2019-10-09 ENCOUNTER — Ambulatory Visit: Payer: Self-pay

## 2019-10-10 ENCOUNTER — Other Ambulatory Visit: Payer: Self-pay

## 2019-10-10 ENCOUNTER — Ambulatory Visit
Admission: EM | Admit: 2019-10-10 | Discharge: 2019-10-10 | Disposition: A | Discharge status: Home / Self Care | Payer: Medicare HMO | Attending: Emergency Medicine | Admitting: Emergency Medicine

## 2019-10-10 ENCOUNTER — Encounter: Payer: Self-pay | Admitting: Emergency Medicine

## 2019-10-10 DIAGNOSIS — R21 Rash and other nonspecific skin eruption: Secondary | ICD-10-CM

## 2019-10-10 DIAGNOSIS — I776 Arteritis, unspecified: Secondary | ICD-10-CM

## 2019-10-10 MED ORDER — DOXYCYCLINE HYCLATE 100 MG PO CAPS: 100.0000 mg | ORAL_CAPSULE | Freq: Two times a day (BID) | ORAL | 0 refills | Status: AC

## 2019-10-10 MED ORDER — PREDNISONE 10 MG (21) PO TBPK: ORAL_TABLET | Freq: Every day | ORAL | 0 refills | Status: DC

## 2019-10-10 MED ORDER — DEXAMETHASONE SODIUM PHOSPHATE 10 MG/ML IJ SOLN
10.0000 mg | Freq: Once | INTRAMUSCULAR | Status: AC
  Administered 2019-10-10: 10 mg via INTRAMUSCULAR

## 2019-10-10 NOTE — Discharge Instructions (Signed)
Wash with warm water and mild soap Keep covered Blood work ordered.  Follow up with PCP regarding blood work Steroid shot given in office Prednisone taper prescribed.  Take as directed and to completion Doxycycline prescribed.  Take as directed and to completion Return or go to the ER if you have any new or worsening symptoms such as fever, chills, nausea, vomiting, redness, swelling, discharge, if symptoms do not improve with medications, etc..Marland Kitchen

## 2019-10-10 NOTE — ED Triage Notes (Signed)
Rash that started on his rt foot, now on his lt foot and both arms

## 2019-10-10 NOTE — ED Provider Notes (Signed)
[place]   [id number] 10/10/19 Arrival Time: 2595  CC: SKIN COMPLAINT  SUBJECTIVE:  Juan Yang is a 64 y.o. male who presents with a rash to bilateral LEs x 1 week.  Denies precipitating event or trauma.  Speculates possible tick bite.  Denies changes in soaps, detergents, close contacts with similar rash, known trigger or environmental trigger, allergy. Denies medications change or starting a new medication recently.  Localizes the rash to bilateral LEs.  Describes it as painful, itchy, red, and increasing in size.  Was seen by PCP and treated for possible cellulitis.  Denies improvement with medications.  Symptoms are made worse with time.  Denies similar symptoms in the past.   Complains of mild fatigue and nausea.  Denies fever, chills, vomiting, discharge, oral lesions, SOB, chest pain, abdominal pain, changes in bowel or bladder function.    ROS: As per HPI.  All other pertinent ROS negative.     Past Medical History:  Diagnosis Date  . Arthritis   . 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 Charcot-Marie-Tooth disease type 2   . Dyspnea   . Guaiac + stool 10/29/2016  . History of kidney stones   . Hypertension   . Peripheral neuropathy 10/29/2016   Past Surgical History:  Procedure Laterality Date  . COLONOSCOPY WITH PROPOFOL N/A 11/14/2016   Procedure: COLONOSCOPY WITH PROPOFOL;  Surgeon: Rogene Houston, MD;  [facility];  Service: Endoscopy;  Laterality: N/A;  9:15  . LUMBAR LAMINECTOMY/DECOMPRESSION MICRODISCECTOMY Right 05/24/2019   Procedure: LAMINECTOMY AND FORAMINOTOMY LUMBAR FIVE- SACRAL ONE RIGHT;  Surgeon: Vallarie Mare, MD;  Location: [place];  Service: Neurosurgery;  Laterality: Right;  posterior/right  . POLYPECTOMY  11/14/2016   Procedure: POLYPECTOMY- SIGMOID COLON X3 TRANSVERSE COLON X2;  Surgeon: Rogene Houston, MD;  [facility];  Service: Endoscopy;;  . rt ankle surgery     Titanium hinge  . TONSILLECTOMY      Allergies  Allergen Reactions  . Eggs Or Egg-Derived Products Swelling    Duck eggs Sneezing  . Atenolol Diarrhea and Nausea Only  . Cymbalta [Duloxetine Hcl] Other (See Comments)    Pt states it shut his bladder down and could not urinate; states it made him lethargic as well.  Marland Kitchen Penicillins Hives    Did it involve swelling of the face/tongue/throat, SOB, or low BP? Unknown Did it involve sudden or severe rash/hives, skin peeling, or any reaction on the inside of your mouth or nose? Unknown Did you need to seek medical attention at a hospital or doctor's office? yes When did it last happen?Childhood If all above answers are "NO", may proceed with cephalosporin use.   No current facility-administered medications on file prior to encounter.   Current Outpatient Medications on File Prior to Encounter  Medication Sig Dispense Refill  . albuterol (VENTOLIN HFA) 108 (90 Base) MCG/ACT inhaler Inhale 2 puffs into the lungs every 4 (four) hours as needed.    Marland Kitchen amLODipine (NORVASC) 2.5 MG tablet Take 2.5 mg by mouth 2 (two) times daily.    . cyclobenzaprine (FLEXERIL) 10 MG tablet Take 10 mg by mouth in the morning, at noon, and at bedtime.    . gabapentin (NEURONTIN) 300 MG capsule Take 300 mg by mouth 3 (three) times daily. Additional 300 mg if needed for pain    . lidocaine (LIDODERM) 5 % Place 1 patch onto the skin every 12 (twelve) hours. Remove & Discard patch within 12  hours or as directed by MD 30 patch 2  . metoprolol succinate (TOPROL-XL) 25 MG 24 hr tablet Take 12.5 mg by mouth daily.    . mupirocin ointment (BACTROBAN) 2 % Apply 2 application topically in the morning and at bedtime.    . Omega-3 1000 MG CAPS Take 1,000 mg by mouth daily.    Marland Kitchen oxyCODONE-acetaminophen (PERCOCET/ROXICET) 5-325 MG tablet Take 5 tablets by mouth as needed.    . Potassium 99 MG TABS Take 99 mg by mouth daily.    Marland Kitchen sulfamethoxazole-trimethoprim (BACTRIM DS) 800-160 MG tablet Take 1 tablet by  mouth 2 (two) times daily.     Social History   Socioeconomic History  . Marital status: Married    Spouse name: Not on file  . Number of children: Not on file  . Years of education: Not on file  . Highest education level: Not on file  Occupational History  . Not on file  Tobacco Use  . Smoking status: Former Smoker    Packs/day: 1.50    Years: 20.00    Pack years: 30.00    Types: Cigarettes    Quit date: 11/12/2012    Years since quitting: 6.9  . Smokeless tobacco: Never Used  Vaping Use  . Vaping Use: Some days  Substance and Sexual Activity  . Alcohol use: No  . Drug use: No  . Sexual activity: Yes    Birth control/protection: None  Other Topics Concern  . Not on file  Social History Narrative   Right handed   Lives with wife in one story home   Social Determinants of Health   Financial Resource Strain:   . Difficulty of Paying Living Expenses: Not on file  Food Insecurity:   . Worried About Charity fundraiser in the Last Year: Not on file  . Ran Out of Food in the Last Year: Not on file  Transportation Needs:   . Lack of Transportation (Medical): Not on file  . Lack of Transportation (Non-Medical): Not on file  Physical Activity:   . Days of Exercise per Week: Not on file  . Minutes of Exercise per Session: Not on file  Stress:   . Feeling of Stress : Not on file  Social Connections:   . Frequency of Communication with Friends and Family: Not on file  . Frequency of Social Gatherings with Friends and Family: Not on file  . Attends Religious Services: Not on file  . Active Member of Clubs or Organizations: Not on file  . Attends Archivist Meetings: Not on file  . Marital Status: Not on file  Intimate Partner Violence:   . Fear of Current or Ex-Partner: Not on file  . Emotionally Abused: Not on file  . Physically Abused: Not on file  . Sexually Abused: Not on file   No family history on file.  OBJECTIVE: Vitals:   10/10/19 1546  BP:  133/88  Pulse: (!) 111  Resp: 17  Temp: 98.3 F (36.8 C)  TempSrc: Oral  SpO2: 94%    General appearance: alert; no distress Head: NCAT Lungs: Normal respiratory effort CV: Dorsalis pedis pulse 2+ Extremities: no edema Skin: warm and dry; (see pictures below) Psychological: alert and cooperative; normal mood and affect        ASSESSMENT & PLAN:  1. Vasculitis ([place])   2. Rash and nonspecific skin eruption     Meds ordered this encounter  Medications  . dexamethasone (DECADRON) injection 10 mg  .  doxycycline (VIBRAMYCIN) 100 MG capsule    Sig: Take 1 capsule (100 mg total) by mouth 2 (two) times daily for 14 days.    Dispense:  28 capsule    Refill:  0    Order Specific Question:   Supervising Provider    Answer:   Raylene Everts [[id number]]  . predniSONE (STERAPRED UNI-PAK 21 TAB) 10 MG (21) TBPK tablet    Sig: Take by mouth daily. Take 6 tabs by mouth daily  for 2 days, then 5 tabs for 2 days, then 4 tabs for 2 days, then 3 tabs for 2 days, 2 tabs for 2 days, then 1 tab by mouth daily for 2 days    Dispense:  42 tablet    Refill:  0    Order Specific Question:   Supervising Provider    Answer:   Raylene Everts [[id number]]    Wash with warm water and mild soap Keep covered Blood work ordered.  Follow up with PCP regarding blood work Steroid shot given in office Prednisone taper prescribed.  Take as directed and to completion Doxycycline prescribed.  Take as directed and to completion Return or go to the ER if you have any new or worsening symptoms such as fever, chills, nausea, vomiting, redness, swelling, discharge, if symptoms do not improve with medications, etc...  Reviewed expectations re: course of current medical issues. Questions answered. Outlined signs and symptoms indicating need for more acute intervention. Patient verbalized understanding. After Visit Summary given.   Lestine Box, PA-C 10/10/19 [phone]

## 2019-10-11 LAB — COMPREHENSIVE METABOLIC PANEL
ALT: 18 IU/L (ref 0–44)
AST: 17 IU/L (ref 0–40)
Albumin/Globulin Ratio: 1.6 (ref 1.2–2.2)
Albumin: 4.4 g/dL (ref 3.8–4.8)
Alkaline Phosphatase: 144 IU/L — ABNORMAL HIGH (ref 48–121)
BUN/Creatinine Ratio: 9 — ABNORMAL LOW (ref 10–24)
BUN: 12 mg/dL (ref 8–27)
Bilirubin Total: 0.4 mg/dL (ref 0.0–1.2)
CO2: 19 mmol/L — ABNORMAL LOW (ref 20–29)
Calcium: 9.3 mg/dL (ref 8.6–10.2)
Chloride: 105 mmol/L (ref 96–106)
Creatinine, Ser: 1.4 mg/dL — ABNORMAL HIGH (ref 0.76–1.27)
GFR calc Af Amer: 61 mL/min/{1.73_m2} (ref >59)
GFR calc non Af Amer: 53 mL/min/{1.73_m2} — ABNORMAL LOW (ref >59)
Globulin, Total: 2.7 g/dL (ref 1.5–4.5)
Glucose: 85 mg/dL (ref 65–99)
Potassium: 4.7 mmol/L (ref 3.5–5.2)
Sodium: 138 mmol/L (ref 134–144)
Total Protein: 7.1 g/dL (ref 6.0–8.5)

## 2019-10-11 LAB — CBC WITH DIFFERENTIAL/PLATELET
Basophils Absolute: 0.1 10*3/uL (ref 0.0–0.2)
Basos: 1 %
EOS (ABSOLUTE): 0.8 10*3/uL — ABNORMAL HIGH (ref 0.0–0.4)
Eos: 7 %
Hematocrit: 42 % (ref 37.5–51.0)
Hemoglobin: 14.5 g/dL (ref 13.0–17.7)
Immature Grans (Abs): 0 10*3/uL (ref 0.0–0.1)
Immature Granulocytes: 0 %
Lymphocytes Absolute: 2.2 10*3/uL (ref 0.7–3.1)
Lymphs: 18 %
MCH: 32 pg (ref 26.6–33.0)
MCHC: 34.5 g/dL (ref 31.5–35.7)
MCV: 93 fL (ref 79–97)
Monocytes Absolute: 0.8 10*3/uL (ref 0.1–0.9)
Monocytes: 6 %
Neutrophils Absolute: 8.4 10*3/uL — ABNORMAL HIGH (ref 1.4–7.0)
Neutrophils: 68 %
Platelets: 269 10*3/uL (ref 150–450)
RBC: 4.53 x10E6/uL (ref 4.14–5.80)
RDW: 12.8 % (ref 11.6–15.4)
WBC: 12.4 10*3/uL — ABNORMAL HIGH (ref 3.4–10.8)

## 2019-10-11 LAB — C-REACTIVE PROTEIN: CRP: 9 mg/L (ref 0–10)

## 2019-10-11 LAB — SEDIMENTATION RATE: Sed Rate: 55 mm/hr — ABNORMAL HIGH (ref 0–30)

## 2019-10-19 ENCOUNTER — Other Ambulatory Visit: Payer: Medicare HMO

## 2019-10-26 ENCOUNTER — Ambulatory Visit
Admission: EM | Admit: 2019-10-26 | Discharge: 2019-10-26 | Disposition: A | Discharge status: Home / Self Care | Payer: Medicare HMO | Attending: Emergency Medicine | Admitting: Emergency Medicine

## 2019-10-26 DIAGNOSIS — I776 Arteritis, unspecified: Secondary | ICD-10-CM

## 2019-10-26 MED ORDER — PREDNISONE 10 MG (21) PO TBPK: ORAL_TABLET | Freq: Every day | ORAL | 0 refills | Status: DC

## 2019-10-26 MED ORDER — ONDANSETRON 4 MG PO TBDP: 4.0000 mg | ORAL_TABLET | Freq: Three times a day (TID) | ORAL | 0 refills | Status: DC | PRN

## 2019-10-26 MED ORDER — DOXYCYCLINE HYCLATE 100 MG PO CAPS: 100.0000 mg | ORAL_CAPSULE | Freq: Two times a day (BID) | ORAL | 0 refills | Status: DC

## 2019-10-26 NOTE — Discharge Instructions (Signed)
Prescribed prednisone and Keflex Take as prescribed and to completion Follow-up with rheumatology Follow up with PCP if symptoms persists Return or go to the ER if you have any new or worsening symptoms

## 2019-10-26 NOTE — ED Provider Notes (Signed)
[place]   [id number] 10/26/19 Arrival Time: 1823   Chief Complaint  Patient presents with   Rash    SUBJECTIVE: History from: patient.  Juan Yang is a 64 y.o. male with history of vasculitis presents to the urgent care for complaint of rash to bilateral lower extremities for the past few days.  Denies any precipitating event.  Was seen at the urgent care on 10/10/2019 for vasculitis.  Was prescribed prednisone and doxycycline.  Symptom almost resolved.  Denies similar symptoms in the past.  Denies fever, chills, nausea, vomiting, diarrhea, lesion, shortness of breath, chest pain, abdominal pain, change in bowel or bladder.  ROS: As per HPI.  All other pertinent ROS negative.     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   Charcot-Marie-Tooth disease type 2    Dyspnea    Guaiac + stool 10/29/2016   History of kidney stones    Hypertension    Peripheral neuropathy 10/29/2016   Past Surgical History:  Procedure Laterality Date   COLONOSCOPY WITH PROPOFOL N/A 11/14/2016   Procedure: COLONOSCOPY WITH PROPOFOL;  Surgeon: Rogene Houston, MD;  [facility];  Service: Endoscopy;  Laterality: N/A;  9:15   LUMBAR LAMINECTOMY/DECOMPRESSION MICRODISCECTOMY Right 05/24/2019   Procedure: LAMINECTOMY AND FORAMINOTOMY LUMBAR FIVE- SACRAL ONE RIGHT;  Surgeon: Vallarie Mare, MD;  Location: [facility];  Service: Neurosurgery;  Laterality: Right;  posterior/right   POLYPECTOMY  11/14/2016   Procedure: POLYPECTOMY- SIGMOID COLON X3 TRANSVERSE COLON X2;  Surgeon: Rogene Houston, MD;  [facility]: [facility];  Service: Endoscopy;;   rt ankle surgery     Titanium hinge   TONSILLECTOMY     Allergies  Allergen Reactions   Eggs Or Egg-Derived Products Swelling    Duck eggs Sneezing   Atenolol Diarrhea and Nausea Only   Cymbalta [Duloxetine Hcl] Other (See Comments)    Pt states it shut his  bladder down and could not urinate; states it made him lethargic as well.   Penicillins Hives    Did it involve swelling of the face/tongue/throat, SOB, or low BP? Unknown Did it involve sudden or severe rash/hives, skin peeling, or any reaction on the inside of your mouth or nose? Unknown Did you need to seek medical attention at a hospital or doctor's office? yes When did it last happen?Childhood If all above answers are NO, may proceed with cephalosporin use.   No current facility-administered medications on file prior to encounter.   Current Outpatient Medications on File Prior to Encounter  Medication Sig Dispense Refill   albuterol (VENTOLIN HFA) 108 (90 Base) MCG/ACT inhaler Inhale 2 puffs into the lungs every 4 (four) hours as needed.     amLODipine (NORVASC) 2.5 MG tablet Take 2.5 mg by mouth 2 (two) times daily.     cyclobenzaprine (FLEXERIL) 10 MG tablet Take 10 mg by mouth in the morning, at noon, and at bedtime.     gabapentin (NEURONTIN) 300 MG capsule Take 300 mg by mouth 3 (three) times daily. Additional 300 mg if needed for pain     lidocaine (LIDODERM) 5 % Place 1 patch onto the skin every 12 (twelve) hours. Remove & Discard patch within 12 hours or as directed by MD 30 patch 2   metoprolol succinate (TOPROL-XL) 25 MG 24 hr tablet Take 12.5 mg by mouth daily.     mupirocin ointment (BACTROBAN) 2 % Apply 2 application  topically in the morning and at bedtime.     Omega-3 1000 MG CAPS Take 1,000 mg by mouth daily.     oxyCODONE-acetaminophen (PERCOCET/ROXICET) 5-325 MG tablet Take 5 tablets by mouth as needed.     Potassium 99 MG TABS Take 99 mg by mouth daily.     sulfamethoxazole-trimethoprim (BACTRIM DS) 800-160 MG tablet Take 1 tablet by mouth 2 (two) times daily.     Social History   Socioeconomic History   Marital status: Married    Spouse name: Not on file   Number of children: Not on file   Years of education: Not on file   Highest  education level: Not on file  Occupational History   Not on file  Tobacco Use   Smoking status: Former Smoker    Packs/day: 1.50    Years: 20.00    Pack years: 30.00    Types: Cigarettes    Quit date: 11/12/2012    Years since quitting: 6.9   Smokeless tobacco: Never Used  Vaping Use   Vaping Use: Some days  Substance and Sexual Activity   Alcohol use: No   Drug use: No   Sexual activity: Yes    Birth control/protection: None  Other Topics Concern   Not on file  Social History Narrative   Right handed   Lives with wife in one story home   Social Determinants of Health   Financial Resource Strain:    Difficulty of Paying Living Expenses: Not on file  Food Insecurity:    Worried About Charity fundraiser in the Last Year: Not on file   [employer] of Food in the Last Year: Not on file  Transportation Needs:    Lack of Transportation (Medical): Not on file   Lack of Transportation (Non-Medical): Not on file  Physical Activity:    Days of Exercise per Week: Not on file   Minutes of Exercise per Session: Not on file  Stress:    Feeling of Stress : Not on file  Social Connections:    Frequency of Communication with Friends and Family: Not on file   Frequency of Social Gatherings with Friends and Family: Not on file   Attends Religious Services: Not on file   Active Member of Clubs or Organizations: Not on file   Attends Archivist Meetings: Not on file   Marital Status: Not on file  Intimate Partner Violence:    Fear of Current or Ex-Partner: Not on file   Emotionally Abused: Not on file   Physically Abused: Not on file   Sexually Abused: Not on file   History reviewed. No pertinent family history.  OBJECTIVE:  Vitals:   10/26/19 1922  BP: (!) 145/98  Pulse: 83  Resp: 16  Temp: 97.7 F (36.5 C)  TempSrc: Oral  SpO2: 98%     Physical Exam Vitals and nursing note reviewed.  Constitutional:      General: He is not in acute  distress.    Appearance: Normal appearance. He is normal weight. He is not ill-appearing, toxic-appearing or diaphoretic.  Cardiovascular:     Rate and Rhythm: Normal rate and regular rhythm.     Pulses: Normal pulses.     Heart sounds: Normal heart sounds. No murmur heard.  No friction rub. No gallop.   Pulmonary:     Effort: Pulmonary effort is normal. No respiratory distress.     Breath sounds: Normal breath sounds. No stridor. No wheezing, rhonchi or rales.  Chest:     Chest wall: No tenderness.  Skin:    Findings: Rash present. Rash is macular.  Neurological:     Mental Status: He is alert and oriented to person, place, and time.      LABS:  No results found for this or any previous visit (from the past 24 hour(s)).   ASSESSMENT & PLAN:  1. Vasculitis ([place])     Meds ordered this encounter  Medications   predniSONE (STERAPRED UNI-PAK 21 TAB) 10 MG (21) TBPK tablet    Sig: Take by mouth daily. Take 6 tabs by mouth daily  for 2 days, then 5 tabs for 2 days, then 4 tabs for 2 days, then 3 tabs for 2 days, 2 tabs for 2 days, then 1 tab by mouth daily for 2 days    Dispense:  42 tablet    Refill:  0   doxycycline (VIBRAMYCIN) 100 MG capsule    Sig: Take 1 capsule (100 mg total) by mouth 2 (two) times daily.    Dispense:  20 capsule    Refill:  0    Discharge instructions  Prescribed prednisone and Keflex Take as prescribed and to completion Follow-up with rheumatology Follow up with PCP if symptoms persists Return or go to the ER if you have any new or worsening symptoms    Reviewed expectations re: course of current medical issues. Questions answered. Outlined signs and symptoms indicating need for more acute intervention. Patient verbalized understanding. After Visit Summary given.         Emerson Monte, FNP 10/26/19 2004

## 2019-10-26 NOTE — ED Triage Notes (Signed)
Pt presents with reoccurrence of rash after completing prednisone , pt also has left back pain

## 2019-11-09 ENCOUNTER — Other Ambulatory Visit: Payer: Self-pay

## 2019-11-09 ENCOUNTER — Ambulatory Visit (INDEPENDENT_AMBULATORY_CARE_PROVIDER_SITE_OTHER): Payer: Medicare HMO | Admitting: Neurology

## 2019-11-09 DIAGNOSIS — G9340 Encephalopathy, unspecified: Secondary | ICD-10-CM

## 2019-11-09 DIAGNOSIS — G600 Hereditary motor and sensory neuropathy: Secondary | ICD-10-CM | POA: Diagnosis not present

## 2019-11-09 DIAGNOSIS — R251 Tremor, unspecified: Secondary | ICD-10-CM

## 2019-11-16 DIAGNOSIS — M5116 Intervertebral disc disorders with radiculopathy, lumbar region: Secondary | ICD-10-CM | POA: Diagnosis not present

## 2019-11-16 DIAGNOSIS — M9953 Intervertebral disc stenosis of neural canal of lumbar region: Secondary | ICD-10-CM | POA: Diagnosis not present

## 2019-11-17 DIAGNOSIS — M9953 Intervertebral disc stenosis of neural canal of lumbar region: Secondary | ICD-10-CM | POA: Diagnosis not present

## 2019-11-17 DIAGNOSIS — M5116 Intervertebral disc disorders with radiculopathy, lumbar region: Secondary | ICD-10-CM | POA: Diagnosis not present

## 2019-11-17 NOTE — Procedures (Signed)
ELECTROENCEPHALOGRAM REPORT  Date of Study: 11/09/2019  Patient's Name: Juan Yang MRN: [id number] Date of Birth: 03/01/1955   Clinical History: 64 year old male with episode of confusion  Medications: VENTOLIN HFA 108 (90 Base) MCG/ACT inhaler NORVASC 2.5 MG tablet LIORESAL 10 MG tablet WELLBUTRIN XL 300 MG 24 hr tablet TEMOVATE 0.05 % CUTIVATE 0.05 % cream NEURONTIN 300 MG capsule GLUCOSAMINE-MSM PO LIDODERM 5 % TOPROL-XL 25 MG 24 hr tablet NUCYNTA 100 MG TABS Omega-3 1000 MG CAPS Potassium 99 MG TABS  Technical Summary: A multichannel digital EEG recording measured by the international 10-20 system with electrodes applied with paste and impedances below 5000 ohms performed in our laboratory with EKG monitoring in an awake and drowsy patient.  Hyperventilation not performed as patient was wearing a face mask due to the COVID-19 pandemic.  Photic stimulation was performed.  The digital EEG was referentially recorded, reformatted, and digitally filtered in a variety of bipolar and referential montages for optimal display.    Description: The patient is awake and drowsy during the recording.  During maximal wakefulness, there is a symmetric, medium voltage 9 Hz posterior dominant rhythm that attenuates with eye opening.  The record is symmetric.  During drowsiness, there is an increase in theta slowing of the background.  Stage 2 sleep was not seen.  Photic stimulation did not elicit any abnormalities.  There were no epileptiform discharges or electrographic seizures seen.    EKG lead was unremarkable.  Impression: This awake and drowsy EEG is normal.    Clinical Correlation: A normal EEG does not exclude a clinical diagnosis of epilepsy.  If further clinical questions remain, prolonged EEG may be helpful.  Clinical correlation is advised.   Metta Clines, DO

## 2019-12-01 DIAGNOSIS — M48061 Spinal stenosis, lumbar region without neurogenic claudication: Secondary | ICD-10-CM | POA: Diagnosis not present

## 2019-12-01 DIAGNOSIS — M5416 Radiculopathy, lumbar region: Secondary | ICD-10-CM | POA: Diagnosis not present

## 2019-12-09 DIAGNOSIS — I129 Hypertensive chronic kidney disease with stage 1 through stage 4 chronic kidney disease, or unspecified chronic kidney disease: Secondary | ICD-10-CM | POA: Diagnosis not present

## 2019-12-09 DIAGNOSIS — Z720 Tobacco use: Secondary | ICD-10-CM | POA: Diagnosis not present

## 2019-12-09 DIAGNOSIS — J449 Chronic obstructive pulmonary disease, unspecified: Secondary | ICD-10-CM | POA: Diagnosis not present

## 2019-12-09 DIAGNOSIS — N183 Chronic kidney disease, stage 3 unspecified: Secondary | ICD-10-CM | POA: Diagnosis not present

## 2019-12-12 DIAGNOSIS — M5416 Radiculopathy, lumbar region: Secondary | ICD-10-CM | POA: Diagnosis not present

## 2019-12-12 DIAGNOSIS — Z6829 Body mass index (BMI) 29.0-29.9, adult: Secondary | ICD-10-CM | POA: Diagnosis not present

## 2019-12-12 DIAGNOSIS — I10 Essential (primary) hypertension: Secondary | ICD-10-CM | POA: Diagnosis not present

## 2019-12-15 DIAGNOSIS — I776 Arteritis, unspecified: Secondary | ICD-10-CM | POA: Diagnosis not present

## 2019-12-15 DIAGNOSIS — R69 Illness, unspecified: Secondary | ICD-10-CM | POA: Diagnosis not present

## 2019-12-15 DIAGNOSIS — Z299 Encounter for prophylactic measures, unspecified: Secondary | ICD-10-CM | POA: Diagnosis not present

## 2019-12-15 DIAGNOSIS — I10 Essential (primary) hypertension: Secondary | ICD-10-CM | POA: Diagnosis not present

## 2019-12-15 DIAGNOSIS — Z6830 Body mass index (BMI) 30.0-30.9, adult: Secondary | ICD-10-CM | POA: Diagnosis not present

## 2019-12-15 DIAGNOSIS — M79609 Pain in unspecified limb: Secondary | ICD-10-CM | POA: Diagnosis not present

## 2019-12-15 DIAGNOSIS — J449 Chronic obstructive pulmonary disease, unspecified: Secondary | ICD-10-CM | POA: Diagnosis not present

## 2020-02-14 DIAGNOSIS — M47816 Spondylosis without myelopathy or radiculopathy, lumbar region: Secondary | ICD-10-CM | POA: Diagnosis not present

## 2020-02-14 DIAGNOSIS — W19XXXA Unspecified fall, initial encounter: Secondary | ICD-10-CM | POA: Diagnosis not present

## 2020-02-14 DIAGNOSIS — F17210 Nicotine dependence, cigarettes, uncomplicated: Secondary | ICD-10-CM | POA: Diagnosis not present

## 2020-02-14 DIAGNOSIS — Z299 Encounter for prophylactic measures, unspecified: Secondary | ICD-10-CM | POA: Diagnosis not present

## 2020-02-14 DIAGNOSIS — Z2821 Immunization not carried out because of patient refusal: Secondary | ICD-10-CM | POA: Diagnosis not present

## 2020-02-14 DIAGNOSIS — M545 Low back pain, unspecified: Secondary | ICD-10-CM | POA: Diagnosis not present

## 2020-02-14 DIAGNOSIS — I10 Essential (primary) hypertension: Secondary | ICD-10-CM | POA: Diagnosis not present

## 2020-02-14 DIAGNOSIS — R69 Illness, unspecified: Secondary | ICD-10-CM | POA: Diagnosis not present

## 2020-02-14 DIAGNOSIS — Z6830 Body mass index (BMI) 30.0-30.9, adult: Secondary | ICD-10-CM | POA: Diagnosis not present

## 2020-03-12 DIAGNOSIS — Z720 Tobacco use: Secondary | ICD-10-CM | POA: Diagnosis not present

## 2020-03-12 DIAGNOSIS — J449 Chronic obstructive pulmonary disease, unspecified: Secondary | ICD-10-CM | POA: Diagnosis not present

## 2020-03-12 DIAGNOSIS — I10 Essential (primary) hypertension: Secondary | ICD-10-CM | POA: Diagnosis not present

## 2020-03-12 DIAGNOSIS — N183 Chronic kidney disease, stage 3 unspecified: Secondary | ICD-10-CM | POA: Diagnosis not present

## 2020-03-12 DIAGNOSIS — I129 Hypertensive chronic kidney disease with stage 1 through stage 4 chronic kidney disease, or unspecified chronic kidney disease: Secondary | ICD-10-CM | POA: Diagnosis not present

## 2020-03-12 DIAGNOSIS — M5416 Radiculopathy, lumbar region: Secondary | ICD-10-CM | POA: Diagnosis not present

## 2020-03-12 DIAGNOSIS — Z6829 Body mass index (BMI) 29.0-29.9, adult: Secondary | ICD-10-CM | POA: Diagnosis not present

## 2020-03-28 NOTE — Progress Notes (Signed)
Office Visit Note  Patient: Juan Yang             Date of Birth: [birth year]           MRN: [id number]             PCP: Monico Blitz, MD Referring: Monico Blitz, MD Visit Date: 04/11/2020 Occupation: @GUAROCC @  Subjective:  Rash On bilateral lower extremities..   History of Present Illness: Juan Yang is a 65 y.o. male seen in consultation per request of his PCP.  According the patient about 3 to 4 years ago he started developing rash on his feet which would come and go.  He will have episodes about 2-3 times a year and then resolved.  He states in August 2021 he was gardening and developed rash on his bilateral lower extremity which got worse.  He was seen by the PA at the PCPs office who gave the topical antibiotics and Bactrim.  He states that the rash progressed and moved up his legs and spread to his left lower extremity as well.  He was seen at the urgent care in [place] where he was given a prednisone taper and doxycycline.  He also had a history of tick bite.  2 weeks later after the prednisone taper finished the rash recurred and he was given another course of prednisone taper on October 26, 2019.  He states since then he has had only minor episodes of rash on his ankle.  He has some scratches on his legs due to kitten he has two scratches them on his lower extremities.  He denies history of Raynaud's phenomenon.  He has history of disc disease of lumbar spine and had laminectomy and April 2021.  He states lower back pain persist and he limps due to his lower back pain.  He has been having pain in his left knee joint because he limps.  He also had injury to his right ankle joint in the past which stays swollen.  He gives history of fluid retention.  Activities of Daily Living:  Patient reports morning stiffness for 45 minutes.   Patient Reports nocturnal pain.  Difficulty dressing/grooming: Denies Difficulty climbing stairs: Denies Difficulty getting out of chair:  Denies Difficulty using hands for taps, buttons, cutlery, and/or writing: Reports  Review of Systems  Constitutional: Positive for fatigue. Negative for night sweats.  HENT: Positive for mouth dryness. Negative for mouth sores and nose dryness.   Eyes: Positive for dryness. Negative for pain, redness, itching and visual disturbance.  Respiratory: Negative for shortness of breath and difficulty breathing.   Cardiovascular: Negative for chest pain, palpitations, hypertension, irregular heartbeat and swelling in legs/feet.  Gastrointestinal: Negative for blood in stool, constipation and diarrhea.  Endocrine: Negative for increased urination.  Genitourinary: Positive for difficulty urinating.  Musculoskeletal: Positive for arthralgias, joint pain, joint swelling, myalgias, morning stiffness, muscle tenderness and myalgias. Negative for muscle weakness.  Skin: Positive for color change and rash. Negative for hair loss, nodules/bumps, skin tightness, ulcers and sensitivity to sunlight.  Allergic/Immunologic: Negative for susceptible to infections.  Neurological: Positive for dizziness, numbness and weakness. Negative for fainting, headaches, memory loss and night sweats.  Hematological: Positive for bruising/bleeding tendency. Negative for swollen glands.  Psychiatric/Behavioral: Positive for sleep disturbance. Negative for depressed mood and confusion. The patient is nervous/anxious.     PMFS History:  Patient Active Problem List   Diagnosis Date Noted  . Guaiac positive stools 10/30/2016  . Guaiac + stool 10/29/2016  .  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 Peripheral neuropathy 10/29/2016    Past Medical History:  Diagnosis Date  . Arthritis   . 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 Charcot-Marie-Tooth disease type 2   . Dyspnea   . Guaiac + stool 10/29/2016  . History of kidney stones   . Hypertension   . Peripheral neuropathy 10/29/2016    Family History   Problem Relation Age of Onset  . Charcot-Marie-Tooth disease Mother   . Heart disease Father   . COPD Father   . Arthritis Sister    Past Surgical History:  Procedure Laterality Date  . COLONOSCOPY WITH PROPOFOL N/A 11/14/2016   Procedure: COLONOSCOPY WITH PROPOFOL;  Surgeon: Rogene Houston, MD;  [facility];  Service: Endoscopy;  Laterality: N/A;  9:15  . LUMBAR LAMINECTOMY/DECOMPRESSION MICRODISCECTOMY Right 05/24/2019   Procedure: LAMINECTOMY AND FORAMINOTOMY LUMBAR FIVE- SACRAL ONE RIGHT;  Surgeon: Vallarie Mare, MD;  Location: [facility];  Service: Neurosurgery;  Laterality: Right;  posterior/right  . POLYPECTOMY  11/14/2016   Procedure: POLYPECTOMY- SIGMOID COLON X3 TRANSVERSE COLON X2;  Surgeon: Rogene Houston, MD;  [facility];  Service: Endoscopy;;  . rt ankle surgery     Titanium hinge  . TONSILLECTOMY     Social History   Social History Narrative   Right handed   Lives with wife in one story home    There is no immunization history on file for this patient.   Objective: Vital Signs: BP (!) 142/96 (BP Location: Right Arm, Patient Position: Sitting, Cuff Size: Normal)   Pulse 95   Resp 17   Ht 5' 11"  (1.803 m)   Wt 224 lb (101.6 kg)   BMI 31.24 kg/m    Physical Exam Vitals and nursing note reviewed.  Constitutional:      Appearance: He is well-developed and well-nourished.  HENT:     Head: Normocephalic and atraumatic.  Eyes:     Extraocular Movements: EOM normal.     Conjunctiva/sclera: Conjunctivae normal.     Pupils: Pupils are equal, round, and reactive to light.  Cardiovascular:     Rate and Rhythm: Normal rate and regular rhythm.     Heart sounds: Normal heart sounds.  Pulmonary:     Effort: Pulmonary effort is normal.     Breath sounds: Normal breath sounds.  Abdominal:     General: Bowel sounds are normal.     Palpations: Abdomen is soft.  Musculoskeletal:     Cervical back: Normal range of motion and neck supple.   Skin:    General: Skin is warm and dry.     Capillary Refill: Capillary refill takes less than 2 seconds.     Findings: Rash present.     Comments: Erythematous macular rash was noted over his right ankle over the medial malleolus and some dry scales were noted.  He had several scratches on his right lower extremity.  Neurological:     Mental Status: He is alert and oriented to person, place, and time.  Psychiatric:        Mood and Affect: Mood and affect normal.        Behavior: Behavior normal.      Musculoskeletal Exam: C-spine was in good range of motion.  He has painful limited range of motion of her lumbar spine.  Shoulder joints, elbow joints, wrist joints, MCPs PIPs and DIPs with good range of motion.  He had increased laxity of the  MCP joints bilaterally.  Hip joints with good range of motion.  Knee joints with good range of motion.  He has prominence of his left knee joint without any warmth.  He has warmth and some swelling of his right ankle joint.  CDAI Exam: CDAI Score: -- Patient Global: --; Provider Global: -- Swollen: --; Tender: -- Joint Exam 04/11/2020   No joint exam has been documented for this visit   There is currently no information documented on the homunculus. Go to the Rheumatology activity and complete the homunculus joint exam.  Investigation: No additional findings.  Imaging: No results found.  Recent Labs: Lab Results  Component Value Date   WBC 12.4 (H) 10/10/2019   HGB 14.5 10/10/2019   PLT 269 10/10/2019   NA 138 10/10/2019   K 4.7 10/10/2019   CL 105 10/10/2019   CO2 19 (L) 10/10/2019   GLUCOSE 85 10/10/2019   BUN 12 10/10/2019   CREATININE 1.40 (H) 10/10/2019   BILITOT 0.4 10/10/2019   ALKPHOS 144 (H) 10/10/2019   AST 17 10/10/2019   ALT 18 10/10/2019   PROT 7.1 10/10/2019   ALBUMIN 4.4 10/10/2019   CALCIUM 9.3 10/10/2019   GFRAA 61 10/10/2019    Speciality Comments: No specialty comments available.  Procedures:  No  procedures performed Allergies: Eggs or egg-derived products, Atenolol, Cymbalta [duloxetine hcl], and Penicillins   Assessment / Plan:     Visit Diagnoses: Rash - 10/10/19: ESR 55, CRP 9. Presented to ED on 10/10/19 and 10/26/19-prescribed prednisone and doxycycline followed by prednisone and keflex -patient had two courses of prednisone one in August and one in September.  Since that he has had mild rash on his right ankle.  I would like to get a skin biopsy before making the diagnosis.  The pictures make it suspicious of vasculitis.  Plan: Ambulatory referral to Dermatology, Urinalysis, Routine w reflex microscopic, CK, Sedimentation rate, Rheumatoid factor, ANA, Pan-ANCA, Cryoglobulin  High risk medication use - Plan: CBC with Differential/Platelet, COMPLETE METABOLIC PANEL WITH GFR, Hepatitis B core antibody, IgM, Hepatitis B surface antigen, Hepatitis C antibody, HIV Antibody (routine testing w rflx), QuantiFERON-TB Gold Plus, Serum protein electrophoresis with reflex, IgG, IgA, IgM, Thiopurine methyltransferase(tpmt)rbc  DDD (degenerative disc disease), lumbar - s/p Laminectomy 05/2019 Dr. Marcello Moores.  His ongoing pain.  He may need to lumbar spine fusion per patient.  Chronic pain of left knee-he states due to his lower back pain he has been limping which causes pain and discomfort in his left knee joint.  I offered x-rays which she declined.   Essential hypertension-his blood pressure is elevated.  He has been advised to monitor blood pressure closely.  Hypercholesteremia  History of COPD  Tobacco use disorder - vaping, quit smoking 2013, 1PPD X 20 years  Urticaria  Charcot Marie Tooth muscular atrophy  Neuropathy-he complains of peripheral neuropathy.  He is on gabapentin.  Anxiety and depression  Chronic pain syndrome - Percocet prn for lower back pain.  Orders: Orders Placed This Encounter  Procedures  . CBC with Differential/Platelet  . COMPLETE METABOLIC PANEL WITH GFR  .  Urinalysis, Routine w reflex microscopic  . CK  . Sedimentation rate  . Rheumatoid factor  . ANA  . Pan-ANCA  . Hepatitis B core antibody, IgM  . Hepatitis B surface antigen  . Hepatitis C antibody  . HIV Antibody (routine testing w rflx)  . QuantiFERON-TB Gold Plus  . Serum protein electrophoresis with reflex  . IgG, IgA, IgM  .  Thiopurine methyltransferase(tpmt)rbc  . Cryoglobulin  . Ambulatory referral to Dermatology   No orders of the defined types were placed in this encounter.     Follow-Up Instructions: Return for Vasculitis.   Bo Merino, MD  Note - This record has been created using Editor, commissioning.  Chart creation errors have been sought, but may not always  have been located. Such creation errors do not reflect on  the standard of medical care.

## 2020-04-09 ENCOUNTER — Other Ambulatory Visit: Payer: Self-pay | Admitting: Neurosurgery

## 2020-04-10 ENCOUNTER — Ambulatory Visit: Payer: Medicare HMO | Admitting: Rheumatology

## 2020-04-11 ENCOUNTER — Other Ambulatory Visit: Payer: Self-pay

## 2020-04-11 ENCOUNTER — Encounter: Payer: Self-pay | Admitting: Rheumatology

## 2020-04-11 ENCOUNTER — Ambulatory Visit: Payer: Medicare HMO | Admitting: Rheumatology

## 2020-04-11 VITALS — BP 142/96 | HR 95 | Resp 17 | Ht 71.0 in | Wt 224.0 lb

## 2020-04-11 DIAGNOSIS — I10 Essential (primary) hypertension: Secondary | ICD-10-CM

## 2020-04-11 DIAGNOSIS — R69 Illness, unspecified: Secondary | ICD-10-CM | POA: Diagnosis not present

## 2020-04-11 DIAGNOSIS — M5136 Other intervertebral disc degeneration, lumbar region: Secondary | ICD-10-CM

## 2020-04-11 DIAGNOSIS — R21 Rash and other nonspecific skin eruption: Secondary | ICD-10-CM

## 2020-04-11 DIAGNOSIS — M25562 Pain in left knee: Secondary | ICD-10-CM

## 2020-04-11 DIAGNOSIS — E7800 Pure hypercholesterolemia, unspecified: Secondary | ICD-10-CM | POA: Diagnosis not present

## 2020-04-11 DIAGNOSIS — F419 Anxiety disorder, unspecified: Secondary | ICD-10-CM

## 2020-04-11 DIAGNOSIS — Z8709 Personal history of other diseases of the respiratory system: Secondary | ICD-10-CM

## 2020-04-11 DIAGNOSIS — Z79899 Other long term (current) drug therapy: Secondary | ICD-10-CM

## 2020-04-11 DIAGNOSIS — F17200 Nicotine dependence, unspecified, uncomplicated: Secondary | ICD-10-CM

## 2020-04-11 DIAGNOSIS — G8929 Other chronic pain: Secondary | ICD-10-CM

## 2020-04-11 DIAGNOSIS — L509 Urticaria, unspecified: Secondary | ICD-10-CM | POA: Diagnosis not present

## 2020-04-11 DIAGNOSIS — I776 Arteritis, unspecified: Secondary | ICD-10-CM

## 2020-04-11 DIAGNOSIS — R195 Other fecal abnormalities: Secondary | ICD-10-CM

## 2020-04-11 DIAGNOSIS — M51369 Other intervertebral disc degeneration, lumbar region without mention of lumbar back pain or lower extremity pain: Secondary | ICD-10-CM

## 2020-04-11 DIAGNOSIS — F32A Depression, unspecified: Secondary | ICD-10-CM

## 2020-04-11 DIAGNOSIS — G629 Polyneuropathy, unspecified: Secondary | ICD-10-CM

## 2020-04-11 DIAGNOSIS — R52 Pain, unspecified: Secondary | ICD-10-CM

## 2020-04-11 DIAGNOSIS — G600 Hereditary motor and sensory neuropathy: Secondary | ICD-10-CM | POA: Diagnosis not present

## 2020-04-11 DIAGNOSIS — G894 Chronic pain syndrome: Secondary | ICD-10-CM

## 2020-04-12 NOTE — Progress Notes (Deleted)
NEUROLOGY FOLLOW UP OFFICE NOTE  LIEF PALMATIER [id number]  Assessment/Plan:   1.  Acute encephalopathy 2.  Charcot Marie Tooth disease 3.  Essential tremor  ***  Subjective:  Juan Yang is a 65 year old right-handed male with Charcot Marie Tooth disease who follows up for acute encephalopathy.  UPDATE: Labs from August showed TSH 2.29 and B12 275.  He was advised to start OTC B12 1074mcg daily.  EEG on 11/09/2019 was normal.  ***  HISTORY: On 09/08/2019 he developed some confusion, which gradually progressed by the evening.  He wasn't making sense.  He made bizarre statements.  Speech was slurred.  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  He has had tremors for a few years which became worse.  He had hallucinations, thinking that he saw people outside of the house.  In hindsight, he does not remember everything that happened.  A lot of it feels like a dream.  He presented to the ED at [facility] where he was admitted for concern of TIA.  CT of head and later MRI of brain snowed no acute intracranial abnormality.  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  UA negative.  UDS positive for cannabinoids, opiates and oxycodone, which he treats for chronic neuropathic pain.  Serum alcohol was negative.  Viral panel was negative.    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  He was discharged on ASA 325mg  daily.  CMP alp 131, cr 1.2, BUN 21  He has Charcot Marie Tooth disease, diagnosed in 2006.  Both his mother and brother had CMT.  It has gradually progressed over the years.  He staggers and uses a cane.  He also has history of lumbar spinal stenosis and underwent surgery in April.  PAST MEDICAL HISTORY: Past Medical History:  Diagnosis Date  . Arthritis   . 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  Charcot-Marie-Tooth disease type 2   . Dyspnea   . Guaiac + stool 10/29/2016  . History of kidney stones   . Hypertension   . Peripheral neuropathy 10/29/2016    MEDICATIONS: Current Outpatient Medications on File Prior to Visit  Medication Sig Dispense Refill  . amLODipine (NORVASC) 2.5 MG tablet Take 2.5 mg by mouth 2 (two) times daily.    . cyclobenzaprine (FLEXERIL) 10 MG tablet Take 10 mg by mouth in the morning, at noon, and at bedtime.    . gabapentin (NEURONTIN) 300 MG capsule Take 300 mg by mouth 3 (three) times daily. Additional 300 mg if needed for pain    . metoprolol succinate (TOPROL-XL) 25 MG 24 hr tablet Take 12.5 mg by mouth daily.    . Omega-3 1000 MG CAPS Take 1,000 mg by mouth daily.    Marland Kitchen oxyCODONE-acetaminophen (PERCOCET/ROXICET) 5-325 MG tablet Take 5 tablets by mouth as needed.    . Potassium 99 MG TABS Take 99 mg by mouth daily.     No current facility-administered medications on file prior to visit.    ALLERGIES: Allergies  Allergen Reactions  . Eggs Or Egg-Derived Products Swelling    Duck eggs Sneezing  . Atenolol Diarrhea and Nausea Only  . Cymbalta [Duloxetine Hcl] Other (See Comments)    Pt states it shut his bladder down and could not urinate; states it made him lethargic as well.  Marland Kitchen Penicillins Hives  Did it involve swelling of the face/tongue/throat, SOB, or low BP? Unknown Did it involve sudden or severe rash/hives, skin peeling, or any reaction on the inside of your mouth or nose? Unknown Did you need to seek medical attention at a hospital or doctor's office? yes When did it last happen?Childhood If all above answers are "NO", may proceed with cephalosporin use.    FAMILY HISTORY: Family History  Problem Relation Age of Onset  . Charcot-Marie-Tooth disease Mother   . Heart disease Father   . COPD Father   . Arthritis Sister    Objective:  *** General: No acute distress.  Patient appears well-groomed.   Head:   Normocephalic/atraumatic Eyes:  Fundi examined but not visualized Neck: supple, no paraspinal tenderness, full range of motion Heart:  Regular rate and rhythm Lungs:  Clear to auscultation bilaterally Back: No paraspinal tenderness Neurological Exam: alert and oriented to person, place, and time. Attention span and concentration intact, recent and remote memory intact, fund of knowledge intact.  Speech fluent and not dysarthric, language intact.  CN II-XII intact. Bulk and tone normal, muscle strength 5-/5 bilateral ankle dorsiflexion, 4+/5 bilateral EHL, otherwise 5/5.  Pinprick and vibratory sensation reduced up to below knees.  Deep tendon reflexes 1+ upper extremities, absent lower extremities, toes downgoing.  Finger to nose and  testing intact.  Wide-based unsteady gait.  Romberg positive.     Metta Clines, DO  CC:  Monico Blitz, MD

## 2020-04-12 NOTE — Progress Notes (Signed)
Creatinine is high.  Please asked patient if he is seeing a nephrologist.  If not he will need a nephrology evaluation.  Other labs are pending.

## 2020-04-13 ENCOUNTER — Encounter: Payer: Self-pay | Admitting: *Deleted

## 2020-04-13 ENCOUNTER — Ambulatory Visit: Payer: Medicare HMO | Admitting: Neurology

## 2020-04-13 DIAGNOSIS — R899 Unspecified abnormal finding in specimens from other organs, systems and tissues: Secondary | ICD-10-CM

## 2020-04-13 NOTE — Progress Notes (Signed)
I will discuss results at the follow-up visit.

## 2020-04-17 ENCOUNTER — Other Ambulatory Visit: Payer: Self-pay | Admitting: *Deleted

## 2020-04-17 DIAGNOSIS — R7989 Other specified abnormal findings of blood chemistry: Secondary | ICD-10-CM

## 2020-04-18 ENCOUNTER — Other Ambulatory Visit: Payer: Self-pay | Admitting: Neurosurgery

## 2020-04-19 LAB — URINALYSIS, ROUTINE W REFLEX MICROSCOPIC
Bilirubin Urine: NEGATIVE
Glucose, UA: NEGATIVE
Hgb urine dipstick: NEGATIVE
Ketones, ur: NEGATIVE
Leukocytes,Ua: NEGATIVE
Nitrite: NEGATIVE
Protein, ur: NEGATIVE
Specific Gravity, Urine: 1.009 (ref 1.001–1.03)
pH: 5.5 (ref 5.0–8.0)

## 2020-04-19 LAB — CK: Total CK: 85 U/L (ref 44–196)

## 2020-04-19 LAB — COMPLETE METABOLIC PANEL WITH GFR
AG Ratio: 1.4 (calc) (ref 1.0–2.5)
ALT: 13 U/L (ref 9–46)
AST: 14 U/L (ref 10–35)
Albumin: 4.1 g/dL (ref 3.6–5.1)
Alkaline phosphatase (APISO): 113 U/L (ref 35–144)
BUN/Creatinine Ratio: 7 (calc) (ref 6–22)
BUN: 11 mg/dL (ref 7–25)
CO2: 18 mmol/L — ABNORMAL LOW (ref 20–32)
Calcium: 9.1 mg/dL (ref 8.6–10.3)
Chloride: 103 mmol/L (ref 98–110)
Creat: 1.64 mg/dL — ABNORMAL HIGH (ref 0.70–1.25)
GFR, Est African American: 50 mL/min/{1.73_m2} — ABNORMAL LOW (ref >=60)
GFR, Est Non African American: 44 mL/min/{1.73_m2} — ABNORMAL LOW (ref >=60)
Globulin: 2.9 g/dL (calc) (ref 1.9–3.7)
Glucose, Bld: 88 mg/dL (ref 65–99)
Potassium: 4.1 mmol/L (ref 3.5–5.3)
Sodium: 137 mmol/L (ref 135–146)
Total Bilirubin: 0.4 mg/dL (ref 0.2–1.2)
Total Protein: 7 g/dL (ref 6.1–8.1)

## 2020-04-19 LAB — PROTEIN ELECTROPHORESIS, SERUM, WITH REFLEX
Albumin ELP: 3.9 g/dL (ref 3.8–4.8)
Alpha 1: 0.3 g/dL (ref 0.2–0.3)
Alpha 2: 0.7 g/dL (ref 0.5–0.9)
Beta 2: 0.4 g/dL (ref 0.2–0.5)
Beta Globulin: 0.5 g/dL (ref 0.4–0.6)
Gamma Globulin: 1.1 g/dL (ref 0.8–1.7)
Total Protein: 7 g/dL (ref 6.1–8.1)

## 2020-04-19 LAB — PAN-ANCA
ANCA Screen: NEGATIVE
Myeloperoxidase Abs: <1 AI
Serine Protease 3: <1 AI

## 2020-04-19 LAB — CBC WITH DIFFERENTIAL/PLATELET
Absolute Monocytes: 780 cells/uL (ref 200–950)
Basophils Absolute: 156 cells/uL (ref 0–200)
Basophils Relative: 1.3 %
Eosinophils Absolute: 636 cells/uL — ABNORMAL HIGH (ref 15–500)
Eosinophils Relative: 5.3 %
HCT: 42.8 % (ref 38.5–50.0)
Hemoglobin: 14.9 g/dL (ref 13.2–17.1)
Lymphs Abs: 3252 cells/uL (ref 850–3900)
MCH: 32 pg (ref 27.0–33.0)
MCHC: 34.8 g/dL (ref 32.0–36.0)
MCV: 92 fL (ref 80.0–100.0)
MPV: 10.1 fL (ref 7.5–12.5)
Monocytes Relative: 6.5 %
Neutro Abs: 7176 cells/uL (ref 1500–7800)
Neutrophils Relative %: 59.8 %
Platelets: 296 10*3/uL (ref 140–400)
RBC: 4.65 10*6/uL (ref 4.20–5.80)
RDW: 12.3 % (ref 11.0–15.0)
Total Lymphocyte: 27.1 %
WBC: 12 10*3/uL — ABNORMAL HIGH (ref 3.8–10.8)

## 2020-04-19 LAB — SEDIMENTATION RATE: Sed Rate: 19 mm/h (ref 0–20)

## 2020-04-19 LAB — IGG, IGA, IGM
IgG (Immunoglobin G), Serum: 1206 mg/dL (ref 600–1540)
IgM, Serum: 99 mg/dL (ref 50–300)
Immunoglobulin A: 288 mg/dL (ref 70–320)

## 2020-04-19 LAB — QUANTIFERON-TB GOLD PLUS
Mitogen-NIL: >10 IU/mL
NIL: 0.03 IU/mL
QuantiFERON-TB Gold Plus: NEGATIVE
TB1-NIL: 0 IU/mL
TB2-NIL: <0 IU/mL

## 2020-04-19 LAB — RHEUMATOID FACTOR: Rheumatoid fact SerPl-aCnc: <14 IU/mL (ref <14)

## 2020-04-19 LAB — HIV ANTIBODY (ROUTINE TESTING W REFLEX): HIV 1&2 Ab, 4th Generation: NONREACTIVE

## 2020-04-19 LAB — HEPATITIS B SURFACE ANTIGEN: Hepatitis B Surface Ag: NONREACTIVE

## 2020-04-19 LAB — IFE INTERPRETATION

## 2020-04-19 LAB — CRYOGLOBULIN: Cryoglobulin, Qualitative Analysis: NOT DETECTED

## 2020-04-19 LAB — ANA: Anti Nuclear Antibody (ANA): POSITIVE — AB

## 2020-04-19 LAB — THIOPURINE METHYLTRANSFERASE (TPMT), RBC: Thiopurine Methyltransferase, RBC: 13 nmol/hr/mL RBC

## 2020-04-19 LAB — HEPATITIS C ANTIBODY
Hepatitis C Ab: NONREACTIVE
SIGNAL TO CUT-OFF: 0.01 (ref <1.00)

## 2020-04-19 LAB — ANTI-NUCLEAR AB-TITER (ANA TITER): ANA Titer 1: 1:160 {titer} — ABNORMAL HIGH

## 2020-04-19 LAB — HEPATITIS B CORE ANTIBODY, IGM: Hep B C IgM: NONREACTIVE

## 2020-04-30 NOTE — Progress Notes (Signed)
Surgical Instructions    Your procedure is scheduled on Wednesday, March 23rd  Report to [facility] Main Entrance "A" at 05:30 A.M., then check in with the Admitting office.  Call this number if you have problems the morning of surgery:  [phone]   If you have any questions prior to your surgery date call [phone]: Open Monday-Friday 8am-4pm    Remember:  Do not eat or drink after midnight the night before your surgery     Take these medicines the morning of surgery with A SIP OF WATER   amLODipine (NORVASC) buPROPion (WELLBUTRIN XL) gabapentin (NEURONTIN) metoprolol succinate (TOPROL-XL)   Take these medicines if needed the morning of surgery with a SIP OF WATER  cyclobenzaprine (FLEXERIL) gabapentin (NEURONTIN) oxyCODONE-acetaminophen (PERCOCET/ROXICET)  Follow your surgeon's instructions on when to stop Aspirin.  If no instructions were given by your surgeon then you will need to call the office to get those instructions.    As of today, STOP taking Aleve, Naproxen, Ibuprofen, Motrin, Advil, Goody's, BC's, all herbal medications, fish oil, and all vitamins.                     Do not wear jewelry.            Do not wear lotions, powders, colognes, or deodorant.            Men may shave face and neck.            Do not bring valuables to the hospital.            [facility] is not responsible for any belongings or valuables.  Do NOT Smoke (Tobacco/Vaping) or drink Alcohol 24 hours prior to your procedure If you use a CPAP at night, you may bring all equipment for your overnight stay.   Contacts, glasses, dentures or bridgework may not be worn into surgery, please bring cases for these belongings   For patients admitted to the hospital, discharge time will be determined by your treatment team.   Patients discharged the day of surgery will not be allowed to drive home, and someone needs to stay with them for 24 hours.    Special instructions:   [place]-  Preparing For Surgery  Before surgery, you can play an important role. Because skin is not sterile, your skin needs to be as free of germs as possible. You can reduce the number of germs on your skin by washing with CHG (chlorahexidine gluconate) Soap before surgery.  CHG is an antiseptic cleaner which kills germs and bonds with the skin to continue killing germs even after washing.    Oral Hygiene is also important to reduce your risk of infection.  Remember - BRUSH YOUR TEETH THE MORNING OF SURGERY WITH YOUR REGULAR TOOTHPASTE  Please do not use if you have an allergy to CHG or antibacterial soaps. If your skin becomes reddened/irritated stop using the CHG.  Do not shave (including legs and underarms) for at least 48 hours prior to first CHG shower. It is OK to shave your face.  Please follow these instructions carefully.   1. Shower the NIGHT BEFORE SURGERY and the MORNING OF SURGERY  2. If you chose to wash your hair, wash your hair first as usual with your normal shampoo.  3. After you shampoo, rinse your hair and body thoroughly to remove the shampoo.   4. Use CHG Soap as you would any other liquid soap. You can apply CHG directly  to the skin and wash gently with a scrungie or a clean washcloth.   5. Apply the CHG Soap to your body ONLY FROM THE NECK DOWN.  Do not use on open wounds or open sores. Avoid contact with your eyes, ears, mouth and genitals (private parts). Wash Face and genitals (private parts)  with your normal soap.   6. Wash thoroughly, paying special attention to the area where your surgery will be performed.  7. Thoroughly rinse your body with warm water from the neck down.  8. DO NOT shower/wash with your normal soap after using and rinsing off the CHG Soap.  9. Pat yourself dry with a CLEAN TOWEL.  10. Wear CLEAN PAJAMAS to bed the night before surgery  11. Place CLEAN SHEETS on your bed the night before your surgery  12. DO NOT SLEEP WITH PETS.   Day of  Surgery: Shower with CHG soap. Wear Clean/Comfortable clothing the morning of surgery Do not apply any deodorants/lotions.   Remember to brush your teeth WITH YOUR REGULAR TOOTHPASTE.   Please read over the following fact sheets that you were given.

## 2020-05-01 ENCOUNTER — Encounter (HOSPITAL_COMMUNITY)
Admission: RE | Admit: 2020-05-01 | Discharge: 2020-05-01 | Disposition: A | Discharge status: Home / Self Care | Payer: Medicare HMO | Source: Ambulatory Visit | Attending: Neurosurgery | Admitting: Neurosurgery

## 2020-05-01 ENCOUNTER — Other Ambulatory Visit: Payer: Self-pay

## 2020-05-01 ENCOUNTER — Telehealth: Payer: Self-pay | Admitting: Radiology

## 2020-05-01 ENCOUNTER — Encounter (HOSPITAL_COMMUNITY): Payer: Self-pay

## 2020-05-01 DIAGNOSIS — Z01812 Encounter for preprocedural laboratory examination: Secondary | ICD-10-CM | POA: Insufficient documentation

## 2020-05-01 DIAGNOSIS — Z20822 Contact with and (suspected) exposure to covid-19: Secondary | ICD-10-CM | POA: Insufficient documentation

## 2020-05-01 HISTORY — DX: Anxiety disorder, unspecified: F41.9

## 2020-05-01 HISTORY — DX: Abdominal aortic aneurysm, without rupture, unspecified: I71.40

## 2020-05-01 HISTORY — DX: Chronic kidney disease, unspecified: N18.9

## 2020-05-01 HISTORY — DX: Abdominal aortic aneurysm, without rupture: I71.4

## 2020-05-01 LAB — TYPE AND SCREEN
ABO/RH(D): B NEG
Antibody Screen: NEGATIVE

## 2020-05-01 LAB — BASIC METABOLIC PANEL
Anion gap: 6 (ref 5–15)
BUN: 11 mg/dL (ref 8–23)
CO2: 28 mmol/L (ref 22–32)
Calcium: 9.8 mg/dL (ref 8.9–10.3)
Chloride: 103 mmol/L (ref 98–111)
Creatinine, Ser: 1.54 mg/dL — ABNORMAL HIGH (ref 0.61–1.24)
GFR, Estimated: 50 mL/min — ABNORMAL LOW (ref >60)
Glucose, Bld: 87 mg/dL (ref 70–99)
Potassium: 4 mmol/L (ref 3.5–5.1)
Sodium: 137 mmol/L (ref 135–145)

## 2020-05-01 LAB — CBC
HCT: 42.3 % (ref 39.0–52.0)
Hemoglobin: 14.2 g/dL (ref 13.0–17.0)
MCH: 31.8 pg (ref 26.0–34.0)
MCHC: 33.6 g/dL (ref 30.0–36.0)
MCV: 94.6 fL (ref 80.0–100.0)
Platelets: 261 10*3/uL (ref 150–400)
RBC: 4.47 MIL/uL (ref 4.22–5.81)
RDW: 12.8 % (ref 11.5–15.5)
WBC: 11.4 10*3/uL — ABNORMAL HIGH (ref 4.0–10.5)
nRBC: 0 % (ref 0.0–0.2)

## 2020-05-01 LAB — SURGICAL PCR SCREEN
MRSA, PCR: NEGATIVE
Staphylococcus aureus: NEGATIVE

## 2020-05-01 LAB — SARS CORONAVIRUS 2 (TAT 6-24 HRS): SARS Coronavirus 2: NEGATIVE

## 2020-05-01 NOTE — Telephone Encounter (Signed)
Patient has already been advised to discontinue Diclofenac.

## 2020-05-01 NOTE — Progress Notes (Signed)
Anesthesia Chart Review:  Case: [id number] Date/Time: 05/02/20 0715   Procedure: Lumbar 5 Sacral 1 Transforaminal lumbar interbody fusion (N/A ) - 3C/RM 21   Anesthesia type: General   Pre-op diagnosis: Lumbar back pain with radiculopathy, affecting right lower extremity   Location: [facility] OR ROOM 21 / [place] OR   Surgeons: Vallarie Mare, MD      DISCUSSION: Patient is a 65 year old male scheduled for the above procedure.  History includes former smoker (quit 11/12/12), HTN, CKD, dyspnea, Charcot-Marie Tooth disease type 2, peripheral neuropathy, AAA (3.2 cm 02/01/18, 3 year f/u US recommended), + stool guaiac (11/14/16 colonoscopy: internal hemorrhoids, sigmoid and transverse tubular adenomas, repeat colonoscopy in 5 years), back surgery (right L5-S1 laminotomy, foraminotomy, excision of synovial cyst 05/24/19).  He was evaluated by rheumatologist Dr. Estanislado Pandy on 04/11/20 for intermittent leg rash for the past 3-4 years. Multiple labs done. ANA Titer 1 elevated at 1:160, nuclear speckled pattern ("Speckled pattern is associated with mixed connective tissues disease (MCTD), systemic lupus erythematosus (SLE), Sjogren's syndrome, dermatomyositis, and systemic sclerosis/polymyositis overlap.") which she plans to discuss at next visit. She referred him to dermatology and also to nephrology (elevated Creatinine with known CKD stage 3 by PCP notes). Appears nephrology appointment is scheduled for 05/11/20.  ASA on hold for 1 week prior to surgery. 05/01/20 presurgical COVID-19 test in process. Anesthesia team to evaluate on the day of surgery.    VS: BP (!) 134/92   Pulse 93   Temp 36.8 C (Oral)   Resp 18   Ht 6' (1.829 m)   Wt 101.4 kg   SpO2 98%   BMI 30.31 kg/m     PROVIDERS: Monico Blitz, MD is PCP. 12/15/19 office note scanned under Media tab. Bo Merino, MD is rheumatologist - Appears dermatology referral was to [facility] and nephrology to [facility] (appears to have an  upcoming visit on 05/11/20 with nephrologist Ulice Bold, MD at [facility] in [place], see [facility] Everywhere)   LABS: Labs reviewed: Acceptable for surgery. Cr 1.54 appears consistent with previous results. (all labs ordered are listed, but only abnormal results are displayed)  Labs Reviewed  BASIC METABOLIC PANEL - Abnormal; Notable for the following components:      Result Value   Creatinine, Ser 1.54 (*)    GFR, Estimated 50 (*)    All other components within normal limits  CBC - Abnormal; Notable for the following components:   WBC 11.4 (*)    All other components within normal limits  SURGICAL PCR SCREEN  SARS CORONAVIRUS 2 (TAT 6-24 HRS)  TYPE AND SCREEN     OTHER: EEG 11/09/19: Impression: This awake and drowsy EEG is normal.   Clinical Correlation: A normal EEG does not exclude a clinical diagnosis of epilepsy.  If further clinical questions remain, prolonged EEG may be helpful.  Clinical correlation is advised.   IMAGES: MRI L-spine 12/01/19 (copy in PACS/Canopy) IMPRESSION: 1. Postsurgical changes from right laminotomy at L5-S1 with postsurgical granulation tissue within the right subarticular zone involving the traversing right S1 nerve root. 2. Multilevel degenerative changes of the lumbar spine with unchanged moderate spinal canal stenosis at L4-L5 and mild spinal canal stenosis at L3-L4. 3. Severe right and mild left neural foraminal narrowing at L5-S1. Posterior displacement of the previously described small synovial cyst in the right subarticular zone.   MRI/MRA brain/head 09/08/19 (copy in PACS/Canopy) IMPRESSION: MRI brain: 1. The axial diffusion-weighted sequence is mild to moderately  motion degraded. 2. No acute infarct or acute intracranial abnormality is identified. 3. Mild generalized parenchymal atrophy and chronic small vessel ischemic disease, progressed as compared to the prior MRI  of 09/27/2008. 4. Mild ethmoid sinus mucosal thickening. MRA head: 1. No intracranial large vessel occlusion. 2. Apparent moderate/severe focal stenosis within the pre cavernous/proximal cavernous left ICA. 3. Apparent high-grade stenosis at the origin of the right PICA. 4. Unchanged from the prior MRA of 09/27/2008, there is a bulbous appearance of the anterior communicating artery complex eccentric to the right, measuring 3 mm and likely reflecting an aneurysm.   CT Abd/pelvis 02/01/18 (copy in PACS/Canopy) IMPRESSION: 1. Infrarenal abdominal aortic aneurysm measuring 3.2 x 2.6 cm at the level of the IMA takeoff. Recommend followup by ultrasound in 3 years. This recommendation follows ACR consensus guidelines: White Paper of the ACR Incidental Findings Committee II on Vascular Findings. J Am Coll Radiol 2013; 17:510-258 2. Simple appearing bilateral renal cysts, the largest is on the right measuring 4.1 cm. 3. Hyperdense lesion off the lower pole the left kidney measuring 5 mm may represent a hemorrhagic or proteinaceous cyst. 4. Moderate stool burden. 5. Thoracolumbar spondylosis.   EKG: 05/23/19: Normal sinus rhythm Normal ECG No significant change since last tracing Confirmed by End, Harrell Gave [phone]) on 05/23/2019 8:43:41 PM   CV: Echo 10/25/08: Study Conclusions  Left ventricle: The cavity size was normal. Wall thickness was  normal. Systolic function was normal. The estimated ejection  fraction was in the range of 55% to 60%. Wall motion was normal;  there were no regional wall motion abnormalities. Doppler parameters  are consistent with abnormal left ventricular relaxation (grade 1  diastolic dysfunction).    Past Medical History:  Diagnosis Date  . AAA (abdominal aortic aneurysm) (HCC)    3.2 cm 02/01/18 CT, recommended [place] in 3 years  . Anxiety   . Arthritis   . 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 Charcot-Marie-Tooth disease type 2   .  CKD (chronic kidney disease)   . Dyspnea   . Guaiac + stool 10/29/2016  . History of kidney stones   . Hypertension   . Peripheral neuropathy 10/29/2016    Past Surgical History:  Procedure Laterality Date  . COLONOSCOPY WITH PROPOFOL N/A 11/14/2016   Procedure: COLONOSCOPY WITH PROPOFOL;  Surgeon: Rogene Houston, MD;  [facility]: [facility];  Service: Endoscopy;  Laterality: N/A;  9:15  . LUMBAR LAMINECTOMY/DECOMPRESSION MICRODISCECTOMY Right 05/24/2019   Procedure: LAMINECTOMY AND FORAMINOTOMY LUMBAR FIVE- SACRAL ONE RIGHT;  Surgeon: Vallarie Mare, MD;  Location: [place];  Service: Neurosurgery;  Laterality: Right;  posterior/right  . POLYPECTOMY  11/14/2016   Procedure: POLYPECTOMY- SIGMOID COLON X3 TRANSVERSE COLON X2;  Surgeon: Rogene Houston, MD;  Location: [facility];  Service: Endoscopy;;  . rt ankle surgery     Titanium hinge  . TONSILLECTOMY      MEDICATIONS: . amLODipine (NORVASC) 2.5 MG tablet  . aspirin EC 325 MG tablet  . buPROPion (WELLBUTRIN XL) 300 MG 24 hr tablet  . Ca Carbonate-Mag Hydroxide (ROLAIDS PO)  . clobetasol cream (TEMOVATE) 0.05 %  . cyclobenzaprine (FLEXERIL) 10 MG tablet  . gabapentin (NEURONTIN) 300 MG capsule  . Glucosamine HCl-MSM (GLUCOSAMINE-MSM PO)  . ibuprofen (ADVIL) 200 MG tablet  . metoprolol succinate (TOPROL-XL) 25 MG 24 hr tablet  . naproxen sodium (ALEVE) 220 MG tablet  . nortriptyline (PAMELOR) 25 MG capsule  . Omega-3 1000 MG CAPS  . oxyCODONE-acetaminophen (PERCOCET/ROXICET) 5-325  MG tablet  . Potassium 99 MG TABS  . sildenafil (VIAGRA) 100 MG tablet   No current facility-administered medications for this encounter.    Myra Gianotti, PA-C Surgical Short Stay/Anesthesiology [facility] Phone [phone] [facility] Phone [phone] 05/01/2020 4:45 PM

## 2020-05-01 NOTE — Anesthesia Preprocedure Evaluation (Addendum)
Anesthesia Evaluation  Patient identified by MRN, date of birth, ID band Patient awake    Reviewed: Allergy & Precautions, NPO status , Patient's Chart, lab work & pertinent test results  Airway Mallampati: III  TM Distance: >3 FB Neck ROM: Full    Dental  (+) Dental Advisory Given   Pulmonary Patient abstained from smoking., former smoker,    breath sounds clear to auscultation       Cardiovascular hypertension, Pt. on medications and Pt. on home beta blockers  Rhythm:Regular Rate:Normal     Neuro/Psych  Neuromuscular disease    GI/Hepatic negative GI ROS, Neg liver ROS,   Endo/Other  negative endocrine ROS  Renal/GU Renal Insufficiency and CRFRenal disease     Musculoskeletal  (+) Arthritis ,   Abdominal   Peds  Hematology negative hematology ROS (+)   Anesthesia Other Findings   Reproductive/Obstetrics                          Lab Results  Component Value Date   WBC 11.4 (H) 05/01/2020   HGB 14.2 05/01/2020   HCT 42.3 05/01/2020   MCV 94.6 05/01/2020   PLT 261 05/01/2020   Lab Results  Component Value Date   CREATININE 1.54 (H) 05/01/2020   BUN 11 05/01/2020   NA 137 05/01/2020   K 4.0 05/01/2020   CL 103 05/01/2020   CO2 28 05/01/2020    Anesthesia Physical Anesthesia Plan  ASA: III  Anesthesia Plan: General   Post-op Pain Management:    Induction: Intravenous  PONV Risk Score and Plan: 2 and Ondansetron, Dexamethasone and Treatment may vary due to age or medical condition  Airway Management Planned: Oral ETT  Additional Equipment:   Intra-op Plan:   Post-operative Plan: Extubation in OR  Informed Consent: I have reviewed the patients History and Physical, chart, labs and discussed the procedure including the risks, benefits and alternatives for the proposed anesthesia with the patient or authorized representative who has indicated his/her understanding and  acceptance.     Dental advisory given  Plan Discussed with: CRNA  Anesthesia Plan Comments:      Anesthesia Quick Evaluation

## 2020-05-01 NOTE — Telephone Encounter (Signed)
Juan Yang spoke with patient and reitirated to no longer take Diclofenac.

## 2020-05-01 NOTE — Telephone Encounter (Signed)
Spoke with [facility] regarding nephrology referral, patient was rated a 3 and will be contacted to schedule an appointment. Advised, per nephrology, patient quit taking Diclofenac- I do not see the medication listed on patient's med list, was he advised already to quit?

## 2020-05-01 NOTE — Progress Notes (Signed)
PCP - Dr. Manuella Ghazi, [place] Cardiologist - denies   Chest x-ray - n/a EKG - 05/23/2019 Stress Test - denies ECHO - denies Cardiac Cath - denies  Sleep Study - more than 10 years ago - patient unaware of actual result CPAP - denies use   Blood Thinner Instructions: n/a Aspirin Instructions: Per MD patient stopped Aspirin 1 week ago.    COVID TEST- 05/01/2020   Anesthesia review: no  Patient denies shortness of breath, fever, cough and chest pain at PAT appointment   All instructions explained to the patient, with a verbal understanding of the material. Patient agrees to go over the instructions while at home for a better understanding. Patient also instructed to self quarantine after being tested for COVID-19. The opportunity to ask questions was provided.

## 2020-05-02 ENCOUNTER — Encounter (HOSPITAL_COMMUNITY): Payer: Self-pay

## 2020-05-02 ENCOUNTER — Inpatient Hospital Stay (HOSPITAL_COMMUNITY): Payer: Medicare HMO

## 2020-05-02 ENCOUNTER — Encounter (HOSPITAL_COMMUNITY)
Admission: RE | Disposition: A | Discharge status: Home / Self Care | Payer: Self-pay | Source: Home / Self Care | Attending: Neurosurgery

## 2020-05-02 ENCOUNTER — Other Ambulatory Visit: Payer: Self-pay

## 2020-05-02 ENCOUNTER — Inpatient Hospital Stay (HOSPITAL_COMMUNITY)
Admission: RE | Admit: 2020-05-02 | Discharge: 2020-05-03 | DRG: 460 | Disposition: A | Discharge status: Home / Self Care | Payer: Medicare HMO | Attending: Neurosurgery | Admitting: Neurosurgery

## 2020-05-02 DIAGNOSIS — M4807 Spinal stenosis, lumbosacral region: Secondary | ICD-10-CM | POA: Diagnosis present

## 2020-05-02 DIAGNOSIS — N189 Chronic kidney disease, unspecified: Secondary | ICD-10-CM | POA: Diagnosis present

## 2020-05-02 DIAGNOSIS — Z20822 Contact with and (suspected) exposure to covid-19: Secondary | ICD-10-CM | POA: Diagnosis present

## 2020-05-02 DIAGNOSIS — M4727 Other spondylosis with radiculopathy, lumbosacral region: Secondary | ICD-10-CM | POA: Diagnosis not present

## 2020-05-02 DIAGNOSIS — Z880 Allergy status to penicillin: Secondary | ICD-10-CM | POA: Diagnosis not present

## 2020-05-02 DIAGNOSIS — Z87442 Personal history of urinary calculi: Secondary | ICD-10-CM

## 2020-05-02 DIAGNOSIS — Z91012 Allergy to eggs: Secondary | ICD-10-CM

## 2020-05-02 DIAGNOSIS — M4317 Spondylolisthesis, lumbosacral region: Secondary | ICD-10-CM | POA: Diagnosis not present

## 2020-05-02 DIAGNOSIS — Z8261 Family history of arthritis: Secondary | ICD-10-CM

## 2020-05-02 DIAGNOSIS — I714 Abdominal aortic aneurysm, without rupture: Secondary | ICD-10-CM | POA: Diagnosis not present

## 2020-05-02 DIAGNOSIS — G629 Polyneuropathy, unspecified: Secondary | ICD-10-CM | POA: Diagnosis present

## 2020-05-02 DIAGNOSIS — Z7982 Long term (current) use of aspirin: Secondary | ICD-10-CM | POA: Diagnosis not present

## 2020-05-02 DIAGNOSIS — Z825 Family history of asthma and other chronic lower respiratory diseases: Secondary | ICD-10-CM | POA: Diagnosis not present

## 2020-05-02 DIAGNOSIS — Z888 Allergy status to other drugs, medicaments and biological substances status: Secondary | ICD-10-CM | POA: Diagnosis not present

## 2020-05-02 DIAGNOSIS — M7138 Other bursal cyst, other site: Secondary | ICD-10-CM | POA: Diagnosis not present

## 2020-05-02 DIAGNOSIS — Z791 Long term (current) use of non-steroidal anti-inflammatories (NSAID): Secondary | ICD-10-CM

## 2020-05-02 DIAGNOSIS — M5416 Radiculopathy, lumbar region: Secondary | ICD-10-CM | POA: Diagnosis present

## 2020-05-02 DIAGNOSIS — Z8249 Family history of ischemic heart disease and other diseases of the circulatory system: Secondary | ICD-10-CM | POA: Diagnosis not present

## 2020-05-02 DIAGNOSIS — Z91040 Latex allergy status: Secondary | ICD-10-CM

## 2020-05-02 DIAGNOSIS — G600 Hereditary motor and sensory neuropathy: Secondary | ICD-10-CM | POA: Diagnosis present

## 2020-05-02 DIAGNOSIS — I129 Hypertensive chronic kidney disease with stage 1 through stage 4 chronic kidney disease, or unspecified chronic kidney disease: Secondary | ICD-10-CM | POA: Diagnosis present

## 2020-05-02 DIAGNOSIS — Z87891 Personal history of nicotine dependence: Secondary | ICD-10-CM

## 2020-05-02 DIAGNOSIS — Z79899 Other long term (current) drug therapy: Secondary | ICD-10-CM | POA: Diagnosis not present

## 2020-05-02 DIAGNOSIS — M4326 Fusion of spine, lumbar region: Secondary | ICD-10-CM | POA: Diagnosis not present

## 2020-05-02 DIAGNOSIS — Z981 Arthrodesis status: Secondary | ICD-10-CM | POA: Diagnosis not present

## 2020-05-02 DIAGNOSIS — Z419 Encounter for procedure for purposes other than remedying health state, unspecified: Secondary | ICD-10-CM

## 2020-05-02 HISTORY — PX: TRANSFORAMINAL LUMBAR INTERBODY FUSION (TLIF) WITH PEDICLE SCREW FIXATION 1 LEVEL: SHX6141

## 2020-05-02 SURGERY — TRANSFORAMINAL LUMBAR INTERBODY FUSION (TLIF) WITH PEDICLE SCREW FIXATION 1 LEVEL
Anesthesia: General | Site: Back

## 2020-05-02 MED ORDER — PROPOFOL 10 MG/ML IV BOLUS
INTRAVENOUS | Status: DC | PRN
  Administered 2020-05-02: 150 mg via INTRAVENOUS
  Administered 2020-05-02: 50 mg via INTRAVENOUS

## 2020-05-02 MED ORDER — CHLORHEXIDINE GLUCONATE CLOTH 2 % EX PADS: 6.0000 | MEDICATED_PAD | Freq: Once | CUTANEOUS | Status: DC

## 2020-05-02 MED ORDER — THROMBIN 5000 UNITS EX SOLR: OROMUCOSAL | Status: DC | PRN

## 2020-05-02 MED ORDER — MIDAZOLAM HCL 5 MG/5ML IJ SOLN
INTRAMUSCULAR | Status: DC | PRN
  Administered 2020-05-02: 2 mg via INTRAVENOUS

## 2020-05-02 MED ORDER — ACETAMINOPHEN 650 MG RE SUPP: 650.0000 mg | RECTAL | Status: DC | PRN

## 2020-05-02 MED ORDER — ORAL CARE MOUTH RINSE: 15.0000 mL | Freq: Once | OROMUCOSAL | Status: AC

## 2020-05-02 MED ORDER — ROCURONIUM BROMIDE 10 MG/ML (PF) SYRINGE
PREFILLED_SYRINGE | INTRAVENOUS | Status: DC | PRN
  Administered 2020-05-02: 20 mg via INTRAVENOUS
  Administered 2020-05-02: 10 mg via INTRAVENOUS
  Administered 2020-05-02: 60 mg via INTRAVENOUS

## 2020-05-02 MED ORDER — OXYCODONE-ACETAMINOPHEN 5-325 MG PO TABS
1.0000 | ORAL_TABLET | ORAL | Status: DC | PRN
Start: 2020-05-02 — End: 2020-05-03
  Administered 2020-05-03 (×3): 2 via ORAL
  Filled 2020-05-02 (×3): qty 2

## 2020-05-02 MED ORDER — SODIUM CHLORIDE 0.9% FLUSH
3.0000 mL | Freq: Two times a day (BID) | INTRAVENOUS | Status: DC
  Administered 2020-05-02 (×2): 3 mL via INTRAVENOUS

## 2020-05-02 MED ORDER — SUGAMMADEX SODIUM 200 MG/2ML IV SOLN
INTRAVENOUS | Status: DC | PRN
  Administered 2020-05-02: 200 mg via INTRAVENOUS

## 2020-05-02 MED ORDER — BUPIVACAINE LIPOSOME 1.3 % IJ SUSP
INTRAMUSCULAR | Status: AC
  Filled 2020-05-02: qty 20

## 2020-05-02 MED ORDER — POTASSIUM 99 MG PO TABS: 99.0000 mg | ORAL_TABLET | Freq: Every day | ORAL | Status: DC

## 2020-05-02 MED ORDER — METOPROLOL SUCCINATE ER 25 MG PO TB24
12.5000 mg | ORAL_TABLET | Freq: Every day | ORAL | Status: DC
  Administered 2020-05-03: 12.5 mg via ORAL
  Filled 2020-05-02: qty 1

## 2020-05-02 MED ORDER — GABAPENTIN 300 MG PO CAPS
300.0000 mg | ORAL_CAPSULE | Freq: Three times a day (TID) | ORAL | Status: DC
  Administered 2020-05-02 – 2020-05-03 (×3): 300 mg via ORAL
  Filled 2020-05-02 (×3): qty 1

## 2020-05-02 MED ORDER — LIDOCAINE-EPINEPHRINE 1 %-1:100000 IJ SOLN
INTRAMUSCULAR | Status: DC | PRN
  Administered 2020-05-02: 9 mL

## 2020-05-02 MED ORDER — PHENYLEPHRINE 40 MCG/ML (10ML) SYRINGE FOR IV PUSH (FOR BLOOD PRESSURE SUPPORT)
PREFILLED_SYRINGE | INTRAVENOUS | Status: AC
  Filled 2020-05-02: qty 10

## 2020-05-02 MED ORDER — FENTANYL CITRATE (PF) 100 MCG/2ML IJ SOLN
INTRAMUSCULAR | Status: AC
  Filled 2020-05-02: qty 2

## 2020-05-02 MED ORDER — NORTRIPTYLINE HCL 25 MG PO CAPS
25.0000 mg | ORAL_CAPSULE | Freq: Every day | ORAL | Status: DC
  Administered 2020-05-02: 25 mg via ORAL
  Filled 2020-05-02 (×2): qty 1

## 2020-05-02 MED ORDER — VANCOMYCIN HCL 500 MG/100ML IV SOLN
500.0000 mg | Freq: Two times a day (BID) | INTRAVENOUS | Status: AC
  Administered 2020-05-02 – 2020-05-03 (×2): 500 mg via INTRAVENOUS
  Filled 2020-05-02 (×2): qty 100

## 2020-05-02 MED ORDER — ONDANSETRON HCL 4 MG/2ML IJ SOLN
INTRAMUSCULAR | Status: AC
  Filled 2020-05-02: qty 2

## 2020-05-02 MED ORDER — BUPROPION HCL ER (XL) 300 MG PO TB24
300.0000 mg | ORAL_TABLET | Freq: Every day | ORAL | Status: DC
  Administered 2020-05-02 – 2020-05-03 (×2): 300 mg via ORAL
  Filled 2020-05-02 (×2): qty 1

## 2020-05-02 MED ORDER — PHENYLEPHRINE HCL (PRESSORS) 10 MG/ML IV SOLN
INTRAVENOUS | Status: DC | PRN
  Administered 2020-05-02: 80 ug via INTRAVENOUS

## 2020-05-02 MED ORDER — METOPROLOL SUCCINATE ER 25 MG PO TB24
ORAL_TABLET | ORAL | Status: AC
  Filled 2020-05-02: qty 1

## 2020-05-02 MED ORDER — SODIUM CHLORIDE 0.9% FLUSH: 3.0000 mL | INTRAVENOUS | Status: DC | PRN

## 2020-05-02 MED ORDER — OXYCODONE-ACETAMINOPHEN 5-325 MG PO TABS
1.0000 | ORAL_TABLET | Freq: Four times a day (QID) | ORAL | Status: DC | PRN
  Administered 2020-05-02 (×2): 2 via ORAL
  Filled 2020-05-02 (×2): qty 1
  Filled 2020-05-02: qty 2

## 2020-05-02 MED ORDER — GABAPENTIN 300 MG PO CAPS: 300.0000 mg | ORAL_CAPSULE | Freq: Every day | ORAL | Status: DC | PRN

## 2020-05-02 MED ORDER — BUPIVACAINE HCL (PF) 0.5 % IJ SOLN
INTRAMUSCULAR | Status: DC | PRN
  Administered 2020-05-02: 20 mL

## 2020-05-02 MED ORDER — BUPIVACAINE LIPOSOME 1.3 % IJ SUSP
INTRAMUSCULAR | Status: DC | PRN
  Administered 2020-05-02: 20 mL

## 2020-05-02 MED ORDER — CYCLOBENZAPRINE HCL 10 MG PO TABS
10.0000 mg | ORAL_TABLET | Freq: Three times a day (TID) | ORAL | Status: DC | PRN
  Administered 2020-05-03: 10 mg via ORAL
  Filled 2020-05-02: qty 1

## 2020-05-02 MED ORDER — FENTANYL CITRATE (PF) 100 MCG/2ML IJ SOLN
25.0000 ug | INTRAMUSCULAR | Status: DC | PRN
Start: 2020-05-02 — End: 2020-05-02
  Administered 2020-05-02 (×2): 50 ug via INTRAVENOUS

## 2020-05-02 MED ORDER — LIDOCAINE 2% (20 MG/ML) 5 ML SYRINGE
INTRAMUSCULAR | Status: AC
  Filled 2020-05-02: qty 5

## 2020-05-02 MED ORDER — GABAPENTIN 300 MG PO CAPS
300.0000 mg | ORAL_CAPSULE | Freq: Once | ORAL | Status: AC
  Administered 2020-05-02: 300 mg via ORAL
  Filled 2020-05-02: qty 1

## 2020-05-02 MED ORDER — KETAMINE HCL 10 MG/ML IJ SOLN
INTRAMUSCULAR | Status: DC | PRN
  Administered 2020-05-02: 15 mg via INTRAVENOUS
  Administered 2020-05-02: 25 mg via INTRAVENOUS

## 2020-05-02 MED ORDER — FENTANYL CITRATE (PF) 250 MCG/5ML IJ SOLN
INTRAMUSCULAR | Status: AC
  Filled 2020-05-02: qty 5

## 2020-05-02 MED ORDER — FLEET ENEMA 7-19 GM/118ML RE ENEM: 1.0000 | ENEMA | Freq: Once | RECTAL | Status: DC | PRN

## 2020-05-02 MED ORDER — ROCURONIUM BROMIDE 10 MG/ML (PF) SYRINGE
PREFILLED_SYRINGE | INTRAVENOUS | Status: AC
  Filled 2020-05-02: qty 10

## 2020-05-02 MED ORDER — ONDANSETRON HCL 4 MG PO TABS: 4.0000 mg | ORAL_TABLET | Freq: Four times a day (QID) | ORAL | Status: DC | PRN

## 2020-05-02 MED ORDER — SODIUM CHLORIDE 0.9 % IV SOLN
250.0000 mL | INTRAVENOUS | Status: DC
  Administered 2020-05-02: 250 mL via INTRAVENOUS

## 2020-05-02 MED ORDER — MENTHOL 3 MG MT LOZG: 1.0000 | LOZENGE | OROMUCOSAL | Status: DC | PRN

## 2020-05-02 MED ORDER — MORPHINE SULFATE (PF) 2 MG/ML IV SOLN
2.0000 mg | INTRAVENOUS | Status: DC | PRN
Start: 2020-05-02 — End: 2020-05-03

## 2020-05-02 MED ORDER — AMISULPRIDE (ANTIEMETIC) 5 MG/2ML IV SOLN: 10.0000 mg | Freq: Once | INTRAVENOUS | Status: DC | PRN

## 2020-05-02 MED ORDER — LACTATED RINGERS IV SOLN: INTRAVENOUS | Status: DC

## 2020-05-02 MED ORDER — METOPROLOL SUCCINATE 12.5 MG HALF TABLET
12.5000 mg | ORAL_TABLET | Freq: Once | ORAL | Status: AC
  Administered 2020-05-02: 12.5 mg via ORAL
  Filled 2020-05-02: qty 1

## 2020-05-02 MED ORDER — EPHEDRINE 5 MG/ML INJ
INTRAVENOUS | Status: AC
  Filled 2020-05-02: qty 10

## 2020-05-02 MED ORDER — PHENOL 1.4 % MT LIQD: 1.0000 | OROMUCOSAL | Status: DC | PRN

## 2020-05-02 MED ORDER — FENTANYL CITRATE (PF) 100 MCG/2ML IJ SOLN
INTRAMUSCULAR | Status: DC | PRN
  Administered 2020-05-02 (×5): 50 ug via INTRAVENOUS

## 2020-05-02 MED ORDER — OMEGA-3 1000 MG PO CAPS: 1000.0000 mg | ORAL_CAPSULE | Freq: Every day | ORAL | Status: DC

## 2020-05-02 MED ORDER — MIDAZOLAM HCL 2 MG/2ML IJ SOLN
INTRAMUSCULAR | Status: AC
  Filled 2020-05-02: qty 2

## 2020-05-02 MED ORDER — CA CARBONATE-MAG HYDROXIDE 550-110 MG PO CHEW: CHEWABLE_TABLET | Freq: Every day | ORAL | Status: DC | PRN

## 2020-05-02 MED ORDER — ENOXAPARIN SODIUM 40 MG/0.4ML ~~LOC~~ SOLN
40.0000 mg | SUBCUTANEOUS | Status: DC
  Administered 2020-05-03: 40 mg via SUBCUTANEOUS

## 2020-05-02 MED ORDER — ACETAMINOPHEN 325 MG PO TABS: 650.0000 mg | ORAL_TABLET | ORAL | Status: DC | PRN

## 2020-05-02 MED ORDER — AMLODIPINE BESYLATE 5 MG PO TABS
2.5000 mg | ORAL_TABLET | Freq: Two times a day (BID) | ORAL | Status: DC
  Administered 2020-05-02 – 2020-05-03 (×2): 2.5 mg via ORAL
  Filled 2020-05-02 (×2): qty 1

## 2020-05-02 MED ORDER — DOCUSATE SODIUM 100 MG PO CAPS: 100.0000 mg | ORAL_CAPSULE | Freq: Two times a day (BID) | ORAL | Status: DC

## 2020-05-02 MED ORDER — POLYETHYLENE GLYCOL 3350 17 G PO PACK: 17.0000 g | PACK | Freq: Every day | ORAL | Status: DC | PRN

## 2020-05-02 MED ORDER — LIDOCAINE-EPINEPHRINE 1 %-1:100000 IJ SOLN
INTRAMUSCULAR | Status: AC
  Filled 2020-05-02: qty 1

## 2020-05-02 MED ORDER — CHLORHEXIDINE GLUCONATE 0.12 % MT SOLN
15.0000 mL | Freq: Once | OROMUCOSAL | Status: AC
  Administered 2020-05-02: 15 mL via OROMUCOSAL
  Filled 2020-05-02: qty 15

## 2020-05-02 MED ORDER — BUPIVACAINE HCL (PF) 0.5 % IJ SOLN
INTRAMUSCULAR | Status: AC
  Filled 2020-05-02: qty 30

## 2020-05-02 MED ORDER — ONDANSETRON HCL 4 MG/2ML IJ SOLN
INTRAMUSCULAR | Status: DC | PRN
  Administered 2020-05-02: 4 mg via INTRAVENOUS

## 2020-05-02 MED ORDER — KETAMINE HCL 50 MG/5ML IJ SOSY
PREFILLED_SYRINGE | INTRAMUSCULAR | Status: AC
  Filled 2020-05-02: qty 5

## 2020-05-02 MED ORDER — 0.9 % SODIUM CHLORIDE (POUR BTL) OPTIME
TOPICAL | Status: DC | PRN
  Administered 2020-05-02: 1000 mL

## 2020-05-02 MED ORDER — OMEGA-3-ACID ETHYL ESTERS 1 G PO CAPS
1.0000 g | ORAL_CAPSULE | Freq: Every day | ORAL | Status: DC
  Administered 2020-05-03: 1 g via ORAL
  Filled 2020-05-02: qty 1

## 2020-05-02 MED ORDER — PROPOFOL 10 MG/ML IV BOLUS
INTRAVENOUS | Status: AC
  Filled 2020-05-02: qty 40

## 2020-05-02 MED ORDER — PHENYLEPHRINE HCL-NACL 10-0.9 MG/250ML-% IV SOLN
INTRAVENOUS | Status: DC | PRN
  Administered 2020-05-02: 40 ug/min via INTRAVENOUS

## 2020-05-02 MED ORDER — ONDANSETRON HCL 4 MG/2ML IJ SOLN: 4.0000 mg | Freq: Four times a day (QID) | INTRAMUSCULAR | Status: DC | PRN

## 2020-05-02 MED ORDER — LIDOCAINE 2% (20 MG/ML) 5 ML SYRINGE
INTRAMUSCULAR | Status: DC | PRN
  Administered 2020-05-02: 60 mg via INTRAVENOUS

## 2020-05-02 MED ORDER — VANCOMYCIN HCL IN DEXTROSE 1-5 GM/200ML-% IV SOLN
1000.0000 mg | INTRAVENOUS | Status: AC
  Administered 2020-05-02: 1000 mg via INTRAVENOUS
  Filled 2020-05-02: qty 200

## 2020-05-02 MED ORDER — DEXAMETHASONE SODIUM PHOSPHATE 10 MG/ML IJ SOLN
INTRAMUSCULAR | Status: DC | PRN
  Administered 2020-05-02: 10 mg via INTRAVENOUS

## 2020-05-02 MED ORDER — ACETAMINOPHEN 500 MG PO TABS
1000.0000 mg | ORAL_TABLET | Freq: Once | ORAL | Status: AC
  Administered 2020-05-02: 1000 mg via ORAL
  Filled 2020-05-02: qty 2

## 2020-05-02 MED ORDER — POTASSIUM CHLORIDE IN NACL 20-0.9 MEQ/L-% IV SOLN: INTRAVENOUS | Status: DC

## 2020-05-02 MED ORDER — THROMBIN 5000 UNITS EX SOLR
CUTANEOUS | Status: AC
  Filled 2020-05-02: qty 5000

## 2020-05-02 MED ORDER — ALBUMIN HUMAN 5 % IV SOLN: INTRAVENOUS | Status: DC | PRN

## 2020-05-02 MED ORDER — DEXAMETHASONE SODIUM PHOSPHATE 10 MG/ML IJ SOLN
INTRAMUSCULAR | Status: AC
  Filled 2020-05-02: qty 1

## 2020-05-02 MED ORDER — SUCCINYLCHOLINE CHLORIDE 200 MG/10ML IV SOSY
PREFILLED_SYRINGE | INTRAVENOUS | Status: AC
  Filled 2020-05-02: qty 10

## 2020-05-02 MED ORDER — CALCIUM CARBONATE ANTACID 500 MG PO CHEW
1.0000 | CHEWABLE_TABLET | Freq: Every day | ORAL | Status: DC | PRN
  Administered 2020-05-03 (×2): 400 mg via ORAL
  Filled 2020-05-02 (×2): qty 2

## 2020-05-02 SURGICAL SUPPLY — 74 items
BAND RUBBER #18 3X1/16 STRL (MISCELLANEOUS) ×6 IMPLANT
BASKET BONE COLLECTION (BASKET) ×3 IMPLANT
BLADE CLIPPER SURG (BLADE) IMPLANT
BUR CARBIDE MATCH 3.0 (BURR) ×3 IMPLANT
BUR MATCHSTICK NEURO 3.0 LAGG (BURR) ×3 IMPLANT
BUR PRECISION FLUTE 5.0 (BURR) ×3 IMPLANT
CAGE INTERBODY PL LG 7X26.5X24 (Cage) ×3 IMPLANT
CANISTER SUCT 3000ML PPV (MISCELLANEOUS) ×3 IMPLANT
CNTNR URN SCR LID CUP LEK RST (MISCELLANEOUS) ×1 IMPLANT
CONT SPEC 4OZ STRL OR WHT (MISCELLANEOUS) ×3
COVER BACK TABLE 60X90IN (DRAPES) ×3 IMPLANT
COVER WAND RF STERILE (DRAPES) IMPLANT
DECANTER SPIKE VIAL GLASS SM (MISCELLANEOUS) ×3 IMPLANT
DERMABOND ADVANCED (GAUZE/BANDAGES/DRESSINGS) ×2
DERMABOND ADVANCED .7 DNX12 (GAUZE/BANDAGES/DRESSINGS) ×1 IMPLANT
DRAIN JACKSON PRATT 10MM FLAT (MISCELLANEOUS) IMPLANT
DRAPE 3/4 80X56 (DRAPES) ×3 IMPLANT
DRAPE C-ARM 42X72 X-RAY (DRAPES) ×3 IMPLANT
DRAPE C-ARMOR (DRAPES) ×3 IMPLANT
DRAPE LAPAROTOMY 100X72X124 (DRAPES) ×3 IMPLANT
DRAPE MICROSCOPE LEICA (MISCELLANEOUS) ×3 IMPLANT
DRAPE ORTHO SPLIT 77X108 STRL (DRAPES) ×3
DRAPE SURG ORHT 6 SPLT 77X108 (DRAPES) ×1 IMPLANT
DRSG OPSITE POSTOP 4X6 (GAUZE/BANDAGES/DRESSINGS) IMPLANT
DURAPREP 26ML APPLICATOR (WOUND CARE) ×3 IMPLANT
ELECT BLADE INSULATED 6.5IN (ELECTROSURGICAL) ×3
ELECT REM PT RETURN 9FT ADLT (ELECTROSURGICAL) ×3
ELECTRODE BLDE INSULATED 6.5IN (ELECTROSURGICAL) ×1 IMPLANT
ELECTRODE REM PT RTRN 9FT ADLT (ELECTROSURGICAL) ×1 IMPLANT
EVACUATOR SILICONE 100CC (DRAIN) IMPLANT
EXTENDER TAB GUIDE SV 5.5/6.0 (INSTRUMENTS) ×24 IMPLANT
GAUZE 4X4 16PLY RFD (DISPOSABLE) ×6 IMPLANT
GAUZE SPONGE 4X4 12PLY STRL (GAUZE/BANDAGES/DRESSINGS) IMPLANT
GLOVE BIOGEL PI IND STRL 7.5 (GLOVE) ×1 IMPLANT
GLOVE BIOGEL PI INDICATOR 7.5 (GLOVE) ×2
GLOVE ECLIPSE 7.5 STRL STRAW (GLOVE) ×3 IMPLANT
GLOVE EXAM NITRILE XL STR (GLOVE) IMPLANT
GOWN STRL REUS W/ TWL LRG LVL3 (GOWN DISPOSABLE) ×2 IMPLANT
GOWN STRL REUS W/ TWL XL LVL3 (GOWN DISPOSABLE) ×1 IMPLANT
GOWN STRL REUS W/TWL 2XL LVL3 (GOWN DISPOSABLE) IMPLANT
GOWN STRL REUS W/TWL LRG LVL3 (GOWN DISPOSABLE) ×6
GOWN STRL REUS W/TWL XL LVL3 (GOWN DISPOSABLE) ×3
GUIDEWIRE BLUNT NT 450 (WIRE) ×12 IMPLANT
HEMOSTAT POWDER KIT SURGIFOAM (HEMOSTASIS) ×3 IMPLANT
KIT BASIN OR (CUSTOM PROCEDURE TRAY) ×3 IMPLANT
KIT INFUSE XX SMALL 0.7CC (Orthopedic Implant) ×3 IMPLANT
KIT TURNOVER KIT B (KITS) ×3 IMPLANT
MARKER SKIN DUAL TIP RULER LAB (MISCELLANEOUS) ×3 IMPLANT
MILL MEDIUM DISP (BLADE) IMPLANT
NEEDLE HYPO 18GX1.5 BLUNT FILL (NEEDLE) IMPLANT
NEEDLE HYPO 21X1.5 SAFETY (NEEDLE) ×3 IMPLANT
NEEDLE HYPO 22GX1.5 SAFETY (NEEDLE) ×3 IMPLANT
NEEDLE SPNL 18GX3.5 QUINCKE PK (NEEDLE) ×3 IMPLANT
NEEDLE TROCAR PAK (NEEDLE) ×6 IMPLANT
NS IRRIG 1000ML POUR BTL (IV SOLUTION) ×3 IMPLANT
PACK LAMINECTOMY NEURO (CUSTOM PROCEDURE TRAY) ×3 IMPLANT
PAD ARMBOARD 7.5X6 YLW CONV (MISCELLANEOUS) ×9 IMPLANT
PUTTY GRAFTON DBF 6CC W/DELIVE (Putty) ×3 IMPLANT
ROD 5.5 CCM PERC 40 (Rod) ×6 IMPLANT
SCREW MAS VOYAGER 6.5X40 (Screw) ×6 IMPLANT
SCREW SET 5.5/6.0MM SOLERA (Screw) ×12 IMPLANT
SCREW SPINAL IFIX 6.5X45 (Screw) ×6 IMPLANT
SPONGE LAP 4X18 RFD (DISPOSABLE) IMPLANT
SPONGE SURGIFOAM ABS GEL 100 (HEMOSTASIS) IMPLANT
SUT MNCRL AB 4-0 PS2 18 (SUTURE) ×3 IMPLANT
SUT VIC AB 0 CT1 18XCR BRD8 (SUTURE) ×1 IMPLANT
SUT VIC AB 0 CT1 8-18 (SUTURE) ×3
SUT VIC AB 2-0 CP2 18 (SUTURE) ×3 IMPLANT
SYR 20ML LL LF (SYRINGE) ×6 IMPLANT
SYR 30ML LL (SYRINGE) ×3 IMPLANT
TOWEL GREEN STERILE (TOWEL DISPOSABLE) ×3 IMPLANT
TOWEL GREEN STERILE FF (TOWEL DISPOSABLE) ×3 IMPLANT
TRAY FOLEY MTR SLVR 16FR STAT (SET/KITS/TRAYS/PACK) ×3 IMPLANT
WATER STERILE IRR 1000ML POUR (IV SOLUTION) ×3 IMPLANT

## 2020-05-02 NOTE — Transfer of Care (Signed)
Immediate Anesthesia Transfer of Care Note  Patient: Juan Yang  Procedure(s) Performed: Lumbar five Sacral one Transforaminal lumbar interbody fusion (N/A Back)  Patient Location: PACU  Anesthesia Type:General  Level of Consciousness: awake and drowsy  Airway & Oxygen Therapy: Patient Spontanous Breathing and Patient connected to nasal cannula oxygen  Post-op Assessment: Report given to RN, Post -op Vital signs reviewed and stable and Patient moving all extremities X 4  Post vital signs: Reviewed and stable  Last Vitals:  Vitals Value Taken Time  BP    Temp    Pulse 100 05/02/20 1246  Resp 10 05/02/20 1246  SpO2 96 % 05/02/20 1246  Vitals shown include unvalidated device data.  Last Pain:  Vitals:   05/02/20 0601  TempSrc:   PainSc: 4          Complications: No complications documented.

## 2020-05-02 NOTE — Evaluation (Signed)
Physical Therapy Evaluation Patient Details Name: Juan Yang MRN: [id number] DOB: 01-Aug-1955 Today's Date: 05/02/2020   History of Present Illness  pt is a 65 y/o male with severe right leg pain and back pain, s/p R L5 S1 TLIF, instrumentation, micro dissection and grafting.  PMHx,  CMT and recent L L5S1 fusion.  Clinical Impression  Pt admitted with/for Lumbar fusion surgery.  Pt is very uncoordinated post surgery and needing min to min guard assist.  Wife states his incoordination is worse than baseline..  Pt currently limited functionally due to the problems listed below.  (see problems list.)  Pt will benefit from PT to maximize function and safety to be able to get home safely with available assist.     Follow Up Recommendations Home health PT;Supervision - Intermittent;Supervision for mobility/OOB    Equipment Recommendations  Other (comment) (RW if family can't provide)    Recommendations for Other Services       Precautions / Restrictions Precautions Precautions: Fall;Back Precaution Booklet Issued: No      Mobility  Bed Mobility Overal bed mobility: Needs Assistance Bed Mobility: Rolling;Sidelying to Sit;Sit to Sidelying Rolling: Min guard;Min assist Sidelying to sit: Min guard     Sit to sidelying: Min assist General bed mobility comments: repetitve practice after multiple cues, but pt having trouble coordinating transition side to/from sitting.    Transfers Overall transfer level: Needs assistance Equipment used: Rolling walker (2 wheeled) Transfers: Sit to/from Stand Sit to Stand: Min guard         General transfer comment: cues and guard for safety due to incoordination  Ambulation/Gait Ambulation/Gait assistance: Min assist Gait Distance (Feet): 180 Feet Assistive device: Rolling walker (2 wheeled) Gait Pattern/deviations: Step-through pattern   Gait velocity interpretation: 1.31 - 2.62 ft/sec, indicative of limited community  ambulator General Gait Details: mild unsteadiness overall (limbs and trunk), guard for safety  Stairs Stairs: Yes Stairs assistance: Min guard Stair Management: One rail Right;Alternating pattern;Step to pattern;Forwards Number of Stairs: 3 General stair comments: Again uncoordinated, but moderated by use of the rail  Wheelchair Mobility    Modified Rankin (Stroke Patients Only)       Balance Overall balance assessment: Needs assistance   Sitting balance-Leahy Scale: Fair       Standing balance-Leahy Scale: Fair                               Pertinent Vitals/Pain Pain Assessment: Faces Faces Pain Scale: Hurts a little bit Pain Location: low back Pain Descriptors / Indicators: Pressure Pain Intervention(s): Monitored during session    Home Living Family/patient expects to be discharged to:: Private residence Living Arrangements: Spouse/significant other Available Help at Discharge: Family;Available 24 hours/day Type of Home: House       Home Layout: Two level;Bed/bath upstairs Home Equipment: Cane - single point      Prior Function Level of Independence: Independent         Comments: Has poor balance at best, uses vision to balance as has significant LE peripheral neuropathy     Hand Dominance        Extremity/Trunk Assessment   Upper Extremity Assessment Upper Extremity Assessment: Overall WFL for tasks assessed    Lower Extremity Assessment Lower Extremity Assessment: Overall WFL for tasks assessed (incoordination)    Cervical / Trunk Assessment Cervical / Trunk Assessment:  (surgical back, truncal incoordination)  Communication   Communication: No difficulties  Cognition Arousal/Alertness: Awake/alert  Behavior During Therapy: Impulsive;WFL for tasks assessed/performed Overall Cognitive Status: Within Functional Limits for tasks assessed                                        General Comments General  comments (skin integrity, edema, etc.): pt/wife instructed in back care/prec, logroll/transitions, lifting restrictions and progression of activity.  Needs reinforcement.    Exercises     Assessment/Plan    PT Assessment Patient needs continued PT services  PT Problem List Decreased activity tolerance;Decreased mobility;Decreased knowledge of use of DME;Decreased safety awareness;Pain;Decreased strength;Decreased coordination       PT Treatment Interventions DME instruction;Gait training;Stair training;Functional mobility training;Therapeutic activities;Patient/family education    PT Goals (Current goals can be found in the Care Plan section)  Acute Rehab PT Goals Patient Stated Goal: independent PT Goal Formulation: With patient Time For Goal Achievement: 05/04/20 Potential to Achieve Goals: Good    Frequency Min 5X/week   Barriers to discharge        Co-evaluation               AM-PAC PT "6 Clicks" Mobility  Outcome Measure Help needed turning from your back to your side while in a flat bed without using bedrails?: A Little Help needed moving from lying on your back to sitting on the side of a flat bed without using bedrails?: A Little Help needed moving to and from a bed to a chair (including a wheelchair)?: A Little Help needed standing up from a chair using your arms (e.g., wheelchair or bedside chair)?: A Little Help needed to walk in hospital room?: A Little Help needed climbing 3-5 steps with a railing? : A Little 6 Click Score: 18    End of Session Equipment Utilized During Treatment: Back brace Activity Tolerance: Patient tolerated treatment well Patient left: in bed;with call bell/phone within reach;with family/visitor present Nurse Communication: Mobility status PT Visit Diagnosis: Unsteadiness on feet (R26.81);Other abnormalities of gait and mobility (R26.89);Pain Pain - part of body:  (incisional pain)    Time: 0156-1537 PT Time Calculation (min)  (ACUTE ONLY): 39 min   Charges:   PT Evaluation $PT Eval Moderate Complexity: 1 Mod PT Treatments $Gait Training: 8-22 mins $Therapeutic Activity: 8-22 mins        05/02/2020  Ginger Carne., PT Acute Rehabilitation Services [phone]  (pager) [phone]  (office)  Juan Yang 05/02/2020, 6:58 PM

## 2020-05-02 NOTE — Op Note (Signed)
PREOP DIAGNOSIS: Lumbar radiculopathy  POSTOP DIAGNOSIS: Lumbar radiculopathy  PROCEDURE: 1. Right L5-S1 lumbar interbody fusion via transforaminal approach with tubular retractors 2. Placement of interbody cage at L5-S1 3. Nonsegmental instrumentation with percutaneously placed pedicle screw and rod construct at L5-S1 4. Harvest of local autograft 5. Use of morselized allograft 6. Use of microscope for microdissection  SURGEON: Dr. Duffy Rhody, MD  ASSISTANT: None   ANESTHESIA: General Endotracheal  EBL: 100 ml  IMPLANTS: Medtronic 6.5 x 45 mm screws and L5, 6.5 x 40 mm screws at S1, 7 mm expandable [employer] cage  SPECIMENS: None  DRAINS: None  COMPLICATIONS: none  CONDITION: Stable to PACU  HISTORY: Juan Yang is a 64 y.o. 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  Postoperatively, Juan Yang developed persistent radiculopathy, this time more in the L5 distribution rather than S1 as previously.  Imaging showed progression of severe foraminal stenosis at right L5-S1 with a fair amount of degenerative facet disease and bulging disc with degenerative instability contributing to this.  Previous injections did not improve his pain so Juan Yang elected to forego them.  Treatment options were discussed and the patient elected to proceed with MIS TLIF at L5-S1. risks, benefits, alternatives, and expected convalescence were discussed with the patient.  Risks discussed included but were not limited to bleeding, pain, infection, scar, pseudoarthrosis, CSF leak, neurologic deficit, paralysis, and death.  The patient wished to proceed with surgery and informed consent was obtained.  PROCEDURE IN DETAIL: After informed consent was obtained and witnessed, the patient was brought to the operating room. After induction of general anesthesia, the patient was positioned on the operative table in the prone position on  a Jackson table with all pressure points meticulously padded. The skin of the low back was then prepped and draped in the usual sterile fashion.  Under fluoroscopy, the pedicles of the correct levels were identified and marked out on the skin, and after timeout was conducted, the skin was infiltrated with local anesthetic.  A 10 blade was used to incise the skin and the fascia overlying the pedicles on AP fluoroscopy.  Jamshidi's were then placed at the lateral aspect of the pedicle is identified AP fluoroscopy and malleted into the body under x-ray guidance.  C-arm was then switched to lateral which confirmed entry into the body.  K wires were then passed through the Jamshidi's and the sheaths were removed.  On the contralateral side from the TLIF, the pedicle path was tapped and then 6.5 mm sized percutaneous pedicle screws were passed over the K wire and placed under fluoroscopic guidance.  There was good purchase noted.  On the ipsilateral side of the TLIF, the K wires were kept in place.  The tubular retractor system was then docked on the L5-S1 facet joint under fluoroscopic guidance on the right side.  Sequential dilators and final tubular retractor was placed and locked in position under fluoroscopic guidance.  The microscope was then introduced in the field to allow for intraoperative microdissection.  Remaining muscle was moved off of the degenerated facet joint.  There was large extra spinal synovial cyst encountered which was removed.  The lateral edge of the laminotomy was dissected out with a angled curette.  There was a large amount of degenerated synovial cyst material in the foramen which was uncovered after removal of the inferior L5 and superior S1 articular processes.  Additionally, scar was dissected from the traversing nerve  root and the synovial degenerative material was dissected from the exiting L5 nerve root.  Scar tissue over the disc was then dissected out.  Good decompression of the  exiting nerve root and traversing nerve root were confirmed with easy passage of ball ended probe following the dissection.  Epidural veins in the foramen were coagulated and cut with allowed access to the disc space lateral to the traversing nerve root.  The traversing nerve root was gently retracted medially to allow greater access to the disc.  The disc was opened with 15 blade and pituitary rongeur was used to remove disc material.  Disc shavers of increasing size was then used to clean the disc space as well as to restore disc space height.  The interbody space was prepared with rasps and curettes with removal of the cartilage from the endplates.  Appropriate sized interbody spacer was then sized.  The inner body space was packed with morselized allograft mixed with autograft.  An expandable size 7 mm interbody spacer was then tamped into place with a mallet under x-ray guidance.  Juan Yang was then expanded until snug with the endplates.  AP and lateral x-ray confirmed good position of the implant.  The wound was then irrigated thoroughly with bacitracin impregnated irrigation and meticulous hemostasis was obtained.  The retractor was then removed and meticulous hemostasis was obtained in the muscle layer and subcutaneous layer.  On the ipsilateral side, similar to before, the pedicles were tapped over the K wires and size 6.5 mm pedicle screws were placed at each level under fluoroscopic guidance.  Good purchase was noted.  Rods were then passed through the screw towers bilaterally and secured with screw caps.  X-ray confirmed good placement.  The screw caps were final tightened and the towers removed.  Wounds were irrigated thoroughly with bacitracin impregnated irrigation.  Exparel mixed with Marcaine was injected into the paraspinous muscles and subcutaneous tissues bilaterally.  The fascia was closed with 0 Vicryl stitches.  The dermal layer was closed with 2-0 Vicryl stitches in buried interrupted fashion.  The  skin incisions were closed with 4-0 Monocryl subcuticular manner followed by benzoin and Steri-Strips.  Sterile dressings were placed.  Patient was then flipped supine and extubated by the anesthesia service following commands and all 4 extremities.  All counts were correct at the end of surgery.  No complications were noted.

## 2020-05-02 NOTE — Anesthesia Procedure Notes (Addendum)
Procedure Name: Intubation Date/Time: 05/02/2020 8:08 AM Performed by: Inda Coke, CRNA Pre-anesthesia Checklist: Patient identified, Emergency Drugs available, Suction available and Patient being monitored Patient Re-evaluated:Patient Re-evaluated prior to induction Oxygen Delivery Method: Circle System Utilized Preoxygenation: Pre-oxygenation with 100% oxygen Induction Type: IV induction Ventilation: Mask ventilation without difficulty Laryngoscope Size: Mac and 4 Grade View: Grade II Tube type: Oral Tube size: 7.5 mm Number of attempts: 1 Airway Equipment and Method: Stylet and Oral airway Placement Confirmation: ETT inserted through vocal cords under direct vision,  positive ETCO2 and breath sounds checked- equal and bilateral Secured at: 22 cm Tube secured with: Tape Dental Injury: Teeth and Oropharynx as per pre-operative assessment  Comments: SRNA DL x 1 with Miller 2 blade and  grade III view. Esophageal intubation. CRNA DL x 1 with grade II view with MAC 4 blade and successful intubation

## 2020-05-02 NOTE — Anesthesia Postprocedure Evaluation (Signed)
Anesthesia Post Note  Patient: Juan Yang  Procedure(s) Performed: Lumbar five Sacral one Transforaminal lumbar interbody fusion (N/A Back)     Patient location during evaluation: PACU Anesthesia Type: General Level of consciousness: awake and alert Pain management: pain level controlled Vital Signs Assessment: post-procedure vital signs reviewed and stable Respiratory status: spontaneous breathing, nonlabored ventilation, respiratory function stable and patient connected to nasal cannula oxygen Cardiovascular status: blood pressure returned to baseline and stable Postop Assessment: no apparent nausea or vomiting Anesthetic complications: no   No complications documented.  Last Vitals:  Vitals:   05/02/20 1355 05/02/20 1412  BP: 106/73 111/73  Pulse: 93 94  Resp: 15 20  Temp: (!) (P) 36.1 C 36.6 C  SpO2: 94% 96%    Last Pain:  Vitals:   05/02/20 1412  TempSrc: Oral  PainSc:                  [name]

## 2020-05-02 NOTE — H&P (Signed)
CC: back pain, right leg pain  HPI:     Patient is a 65 y.o. male with Charcot Marie Tooth and hx of R L5-S1 decompression and removal of synovial cyst c/o severe right leg pain and back pain, worse with activity.  He will also occasionally feel his right foot give out.    Patient Active Problem List   Diagnosis Date Noted  . Guaiac positive stools 10/30/2016  . Guaiac + stool 10/29/2016  . 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 Peripheral neuropathy 10/29/2016   Past Medical History:  Diagnosis Date  . AAA (abdominal aortic aneurysm) (HCC)    3.2 cm 02/01/18 CT, recommended [place] in 3 years  . Anxiety   . Arthritis   . 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 Charcot-Marie-Tooth disease type 2   . CKD (chronic kidney disease)   . Dyspnea   . Guaiac + stool 10/29/2016  . History of kidney stones   . Hypertension   . Peripheral neuropathy 10/29/2016    Past Surgical History:  Procedure Laterality Date  . COLONOSCOPY WITH PROPOFOL N/A 11/14/2016   Procedure: COLONOSCOPY WITH PROPOFOL;  Surgeon: Rogene Houston, MD;  [facility];  Service: Endoscopy;  Laterality: N/A;  9:15  . LUMBAR LAMINECTOMY/DECOMPRESSION MICRODISCECTOMY Right 05/24/2019   Procedure: LAMINECTOMY AND FORAMINOTOMY LUMBAR FIVE- SACRAL ONE RIGHT;  Surgeon: Vallarie Mare, MD;  Location: [facility];  Service: Neurosurgery;  Laterality: Right;  posterior/right  . POLYPECTOMY  11/14/2016   Procedure: POLYPECTOMY- SIGMOID COLON X3 TRANSVERSE COLON X2;  Surgeon: Rogene Houston, MD;  Location: [facility];  Service: Endoscopy;;  . rt ankle surgery     Titanium hinge  . TONSILLECTOMY      Medications Prior to Admission  Medication Sig Dispense Refill Last Dose  . amLODipine (NORVASC) 2.5 MG tablet Take 2.5 mg by mouth 2 (two) times daily.   05/01/2020 at Unknown time  . aspirin EC 325 MG tablet Take 325 mg by mouth 3 (three) times a week.   Past Week at Unknown time  . buPROPion  (WELLBUTRIN XL) 300 MG 24 hr tablet Take 300 mg by mouth daily.   05/01/2020 at Unknown time  . Ca Carbonate-Mag Hydroxide (ROLAIDS PO) Take 2-3 tablets by mouth daily as needed (heartburn).   Past Week at Unknown time  . cyclobenzaprine (FLEXERIL) 10 MG tablet Take 10 mg by mouth 3 (three) times daily as needed for muscle spasms.   05/01/2020 at Unknown time  . gabapentin (NEURONTIN) 300 MG capsule Take 300 mg by mouth 3 (three) times daily. Additional 300 mg if needed for pain   05/01/2020 at Unknown time  . Glucosamine HCl-MSM (GLUCOSAMINE-MSM PO) Take 1 tablet by mouth daily.   Past Week at Unknown time  . ibuprofen (ADVIL) 200 MG tablet Take 400-600 mg by mouth every 6 (six) hours as needed for headache or moderate pain.   Past Week at Unknown time  . metoprolol succinate (TOPROL-XL) 25 MG 24 hr tablet Take 12.5 mg by mouth daily.   05/01/2020 at Unknown time  . naproxen sodium (ALEVE) 220 MG tablet Take 220-400 mg by mouth 2 (two) times daily as needed (pain).   Past Week at Unknown time  . nortriptyline (PAMELOR) 25 MG capsule Take 25 mg by mouth at bedtime.   05/01/2020 at Unknown time  . Omega-3 1000 MG CAPS Take 1,000 mg by mouth daily.   Past Week at Unknown time  . oxyCODONE-acetaminophen (  PERCOCET/ROXICET) 5-325 MG tablet Take 1-2 tablets by mouth every 6 (six) hours as needed for moderate pain.   05/01/2020 at Unknown time  . Potassium 99 MG TABS Take 99 mg by mouth daily.   Past Week at Unknown time  . sildenafil (VIAGRA) 100 MG tablet Take 100 mg by mouth daily as needed for erectile dysfunction.   Past Week at Unknown time  . clobetasol cream (TEMOVATE) 6.28 % Apply 1 application topically 2 (two) times daily as needed (rash).   More than a month at Unknown time   Allergies  Allergen Reactions  . Eggs Or Egg-Derived Products Swelling    Duck eggs Sneezing  . Atenolol Diarrhea and Nausea Only  . Cymbalta [Duloxetine Hcl] Other (See Comments)    Pt states it shut his bladder down and  could not urinate; states it made him lethargic as well.  . Latex Itching  . Penicillins Hives    Did it involve swelling of the face/tongue/throat, SOB, or low BP? Unknown Did it involve sudden or severe rash/hives, skin peeling, or any reaction on the inside of your mouth or nose? Unknown Did you need to seek medical attention at a hospital or doctor's office? yes When did it last happen?Childhood If all above answers are "NO", may proceed with cephalosporin use.    Social History   Tobacco Use  . Smoking status: Former Smoker    Packs/day: 1.50    Years: 20.00    Pack years: 30.00    Types: Cigarettes    Quit date: 11/12/2012    Years since quitting: 7.4  . Smokeless tobacco: Never Used  Substance Use Topics  . Alcohol use: No    Family History  Problem Relation Age of Onset  . Charcot-Marie-Tooth disease Mother   . Heart disease Father   . COPD Father   . Arthritis Sister      Review of Systems Pertinent items noted in HPI and remainder of comprehensive ROS otherwise negative.  Objective:   Patient Vitals for the past 8 hrs:  BP Temp Temp src Pulse Resp SpO2 Height Weight  05/02/20 0550 (!) 161/97 97.7 F (36.5 C) Oral 81 20 99 % 6' (1.829 m) 100.7 kg   I/O last 3 completed shifts: In: 200 [IV Piggyback:200] Out: -  No intake/output data recorded.      General : Alert, cooperative, no distress, appears stated age   Head:  Normocephalic/atraumatic    Eyes: PERRL, conjunctiva/corneas clear, EOM's intact. Fundi could not be visualized Neck: Supple Chest:  Respirations unlabored Chest wall: no tenderness or deformity Heart: Regular rate and rhythm Abdomen: Soft, nontender and nondistended Extremities: warm and well-perfused Skin: normal turgor, color and texture Neurologic:  Alert, oriented x 3.  Eyes open spontaneously. PERRL, EOMI, VFC, no facial droop. V1-3 intact.  No dysarthria, tongue protrusion symmetric.  CNII-XII intact. Normal strength,  sensation and reflexes throughout.  No pronator drift, 4/5 R DF, otw 5/5 in LEs       Data Review CBC:  Lab Results  Component Value Date   WBC 11.4 (H) 05/01/2020   RBC 4.47 05/01/2020   BMP:  Lab Results  Component Value Date   GLUCOSE 87 05/01/2020   CO2 28 05/01/2020   BUN 11 05/01/2020   BUN 12 10/10/2019   CREATININE 1.54 (H) 05/01/2020   CREATININE 1.64 (H) 04/11/2020   CALCIUM 9.8 05/01/2020   Radiology review:   Advanced degenerative disease at L5-S1 with severe foraminal stenosis, mild spondylolisthesis  Assessment:  65 yo M Charcot Marie Tooth dz, hx of R L5-S1 decompression with right leg radiculopathy   Plan:   - plan for R L5-S1 MIS TLIF

## 2020-05-03 ENCOUNTER — Encounter (HOSPITAL_COMMUNITY): Payer: Self-pay | Admitting: Neurosurgery

## 2020-05-03 MED ORDER — OXYCODONE-ACETAMINOPHEN 5-325 MG PO TABS: 1.0000 | ORAL_TABLET | ORAL | 0 refills | Status: DC | PRN

## 2020-05-03 MED ORDER — OXYCODONE-ACETAMINOPHEN 5-325 MG PO TABS: 1.0000 | ORAL_TABLET | Freq: Four times a day (QID) | ORAL | 0 refills | Status: DC | PRN

## 2020-05-03 MED ORDER — CYCLOBENZAPRINE HCL 10 MG PO TABS: 10.0000 mg | ORAL_TABLET | Freq: Three times a day (TID) | ORAL | 0 refills | Status: DC | PRN

## 2020-05-03 MED ORDER — DOCUSATE SODIUM 100 MG PO CAPS: 100.0000 mg | ORAL_CAPSULE | Freq: Two times a day (BID) | ORAL | 0 refills | Status: DC

## 2020-05-03 NOTE — Discharge Instructions (Signed)
Wound Care REMOVE DRESSING IN 3 DAYS Leave incision open to air. You may shower. Do not scrub directly on incision.  Do not put any creams, lotions, or ointments on incision. Activity Walk each and every day, increasing distance each day. No lifting greater than 5 lbs.  Avoid bending, arching, and twisting. No driving for 2 weeks; may ride as a passenger locally. If provided with back brace, wear when out of bed.  It is not necessary to wear in bed. Diet Resume your normal diet.  Return to Work Will be discussed at you follow up appointment. Call Your Doctor If Any of These Occur Redness, drainage, or swelling at the wound.  Temperature greater than 101 degrees. Severe pain not relieved by pain medication. Incision starts to come apart. Follow Up Appt Call today for appointment in 2-4 weeks ([phone]) or for problems.  If you have any hardware placed in your spine, you will need an x-ray before your appointment.

## 2020-05-03 NOTE — Discharge Summary (Signed)
Physician Discharge Summary  Patient ID: Juan Yang MRN: [id number] DOB/AGE: May 26, 1955 65 y.o.  Admit date: 05/02/2020 Discharge date: 05/03/2020  Admission Diagnoses:  Lumbar radiculopathy  Discharge Diagnoses:  Same Active Problems:   Lumbar radiculopathy   Discharged Condition: Stable  Hospital Course:  Juan Yang is a 65 y.o. male right decompression was symptoms to develop degenerative instability.  He had progressive radiculopathy underwent a right L5-S1 TLIF.  He was admitted to the spine unit postoperatively.  There, he was mobilized.  On postop day #1, his pain was well controlled with p.o. pain medications.  He was ambulating without difficulty, voiding, and was deemed ready for discharge. .  Treatments: Surgery -  L5-S1 TLIF  Discharge Exam: Blood pressure 113/83, pulse 80, temperature 97.7 F (36.5 C), temperature source Oral, resp. rate 16, height 6' (1.829 m), weight 100.7 kg, SpO2 97 %. Awake, alert, oriented Speech fluent, appropriate CN grossly intact 5/5 BUE/BLE Wound c/d/i  Disposition: Discharge disposition: 01-Home or Self Care       Discharge Instructions    Ambulatory referral to Physical Therapy   Complete by: As directed    Incentive spirometry RT   Complete by: As directed      Allergies as of 05/03/2020      Reactions   Other Hives   DUCK EGGS ONLY   Atenolol Diarrhea, Nausea Only   Cymbalta [duloxetine Hcl] Other (See Comments)   Pt states it shut his bladder down and could not urinate; states it made him lethargic as well.   Latex Itching   Penicillins Hives   Did it involve swelling of the face/tongue/throat, SOB, or low BP? Unknown Did it involve sudden or severe rash/hives, skin peeling, or any reaction on the inside of your mouth or nose? Unknown Did you need to seek medical attention at a hospital or doctor's office? yes When did it last happen?Childhood If all above answers are "NO", may proceed with  cephalosporin use.      Medication List    STOP taking these medications   aspirin EC 325 MG tablet   ibuprofen 200 MG tablet Commonly known as: ADVIL   naproxen sodium 220 MG tablet Commonly known as: ALEVE     TAKE these medications   amLODipine 2.5 MG tablet Commonly known as: NORVASC Take 2.5 mg by mouth 2 (two) times daily.   buPROPion 300 MG 24 hr tablet Commonly known as: WELLBUTRIN XL Take 300 mg by mouth daily.   clobetasol cream 0.05 % Commonly known as: TEMOVATE Apply 1 application topically 2 (two) times daily as needed (rash).   cyclobenzaprine 10 MG tablet Commonly known as: FLEXERIL Take 1 tablet (10 mg total) by mouth 3 (three) times daily as needed for muscle spasms.   docusate sodium 100 MG capsule Commonly known as: COLACE Take 1 capsule (100 mg total) by mouth 2 (two) times daily.   gabapentin 300 MG capsule Commonly known as: NEURONTIN Take 300 mg by mouth 3 (three) times daily. Additional 300 mg if needed for pain   GLUCOSAMINE-MSM PO Take 1 tablet by mouth daily.   metoprolol succinate 25 MG 24 hr tablet Commonly known as: TOPROL-XL Take 12.5 mg by mouth daily.   nortriptyline 25 MG capsule Commonly known as: PAMELOR Take 25 mg by mouth at bedtime.   Omega-3 1000 MG Caps Take 1,000 mg by mouth daily.   oxyCODONE-acetaminophen 5-325 MG tablet Commonly known as: PERCOCET/ROXICET Take 1-2 tablets by mouth every 4 (four) hours  as needed for moderate pain. What changed: when to take this   oxyCODONE-acetaminophen 5-325 MG tablet Commonly known as: PERCOCET/ROXICET Take 1-2 tablets by mouth every 6 (six) hours as needed for moderate pain. What changed: You were already taking a medication with the same name, and this prescription was added. Make sure you understand how and when to take each.   Potassium 99 MG Tabs Take 99 mg by mouth daily.   ROLAIDS PO Take 2-3 tablets by mouth daily as needed (heartburn).   sildenafil 100 MG  tablet Commonly known as: VIAGRA Take 100 mg by mouth daily as needed for erectile dysfunction.       Follow-up [facility] Follow up.   Why: The office will contact you to schedule your first physical therapy appointment.   Contact information: [address], [place] [address] P: [phone]              Signed: Vallarie Mare 05/03/2020, 11:45 AM

## 2020-05-03 NOTE — Evaluation (Signed)
Occupational Therapy Evaluation Patient Details Name: Juan Yang MRN: [id number] DOB: January 15, 1956 Today's Date: 05/03/2020    History of Present Illness pt is a 65 y/o male with severe right leg pain and back pain, s/p R L5 S1 TLIF on 05/02/2020, instrumentation, micro dissection and grafting.  PMHx, CMT and recent L L5-S1 fusion.   Clinical Impression   PTA patient independent. Admitted for above and limited by problem list below, including pain, precaution adherence, impaired balance.  He was educated on precautions, ADL compensatory techniques, recommendations, DME and activity progression. Patient completing ADLs with up to min guard assist, transfers with supervision and in room mobility with RW with min guard assist, requires cueing throughout session for precaution adherence and pacing.  Patient completed bed mobility multiple times and requires cueing for technique each time, spouse present and aware. Believe he will benefit from further OT services acutely, but anticipate no further needs after dc as he will have good support from spouse.     Follow Up Recommendations  No OT follow up;Supervision/Assistance - 24 hour    Equipment Recommendations  None recommended by OT    Recommendations for Other Services       Precautions / Restrictions Precautions Precautions: Fall;Back Precaution Booklet Issued: Yes (comment) Precaution Comments: VC's for precautions throughout functional tasks and mobility Required Braces or Orthoses:  (no brace needed) Restrictions Weight Bearing Restrictions: No      Mobility Bed Mobility Overal bed mobility: Needs Assistance Bed Mobility: Rolling;Sidelying to Sit;Sit to Sidelying Rolling: Supervision Sidelying to sit: Supervision     Sit to sidelying: Supervision General bed mobility comments: Repetitive practice. VC's for step-by-step sequencing of log roll.    Transfers Overall transfer level: Needs assistance Equipment used:  Rolling walker (2 wheeled) Transfers: Sit to/from Stand Sit to Stand: Supervision         General transfer comment: VC's for hand placement for transitions and posture    Balance Overall balance assessment: Needs assistance Sitting-balance support: Feet supported;No upper extremity supported Sitting balance-Leahy Scale: Fair     Standing balance support: No upper extremity supported;During functional activity Standing balance-Leahy Scale: Fair Standing balance comment: IT consultant with RW                           ADL either performed or assessed with clinical judgement   ADL Overall ADL's : Needs assistance/impaired     Grooming: Supervision/safety;Standing   Upper Body Bathing: Supervision/ safety;Sitting   Lower Body Bathing: Supervison/ safety;Cueing for back precautions;Sit to/from stand;Sitting/lateral leans   Upper Body Dressing : Supervision/safety;Set up;Sitting   Lower Body Dressing: Supervision/safety;Set up;Sit to/from stand;Cueing for compensatory techniques;Cueing for back precautions Lower Body Dressing Details (indicate cue type and reason): able to complete figure 4 technique, reviewed techniques and donning "bad" leg first Toilet Transfer: Supervision/safety;Ambulation;Regular Toilet;RW Toilet Transfer Details (indicate cue type and reason): simulated home setup, completed with supervision   Toileting - Clothing Manipulation Details (indicate cue type and reason): discussed techniques to adhere to precautions Tub/ Shower Transfer: Tub transfer;Min guard;Ambulation;Shower Technical sales engineer Details (indicate cue type and reason): completed reverse step technique into simulated tub with min guard, voices understanding and spouse reports she will assist Functional mobility during ADLs: Supervision/safety;Rolling walker;Cueing for safety General ADL Comments: reviewed ADLs and precautions/compesantory techniques, requires  increased cueing for pacing and adherence     Vision         Perception     Praxis  Pertinent Vitals/Pain Pain Assessment: Faces Faces Pain Scale: Hurts a little bit Pain Location: low back Pain Descriptors / Indicators: Pressure Pain Intervention(s): Limited activity within patient's tolerance;Monitored during session;Repositioned     Hand Dominance     Extremity/Trunk Assessment Upper Extremity Assessment Upper Extremity Assessment: Overall WFL for tasks assessed   Lower Extremity Assessment Lower Extremity Assessment: Defer to PT evaluation   Cervical / Trunk Assessment Cervical / Trunk Assessment: Other exceptions Cervical / Trunk Exceptions: s/p back surgery   Communication Communication Communication: No difficulties   Cognition Arousal/Alertness: Awake/alert Behavior During Therapy: Impulsive;WFL for tasks assessed/performed Overall Cognitive Status: Within Functional Limits for tasks assessed                                 General Comments: impulsive and requires cueing to redirect/follow precautions   General Comments  spouse present and supportive, voices understanding with precautions and ADLs/mobility    Exercises     Shoulder Instructions      Home Living Family/patient expects to be discharged to:: Private residence Living Arrangements: Spouse/significant other Available Help at Discharge: Family;Available 24 hours/day Type of Home: House       Home Layout: Two level;Bed/bath upstairs Alternate Level Stairs-Number of Steps: 2 Alternate Level Stairs-Rails: Right Bathroom Shower/Tub: Teacher, early years/pre: Standard     Home Equipment: Cane - single point;Shower seat   Additional Comments: Benedict from spouses sister      Prior Functioning/Environment Level of Independence: Independent        Comments: Has poor balance at best, uses vision to balance as has significant LE peripheral neuropathy         OT Problem List: Decreased activity tolerance;Impaired balance (sitting and/or standing);Decreased safety awareness;Decreased knowledge of use of DME or AE;Decreased knowledge of precautions;Pain      OT Treatment/Interventions: Self-care/ADL training;DME and/or AE instruction;Therapeutic activities;Patient/family education;Balance training    OT Goals(Current goals can be found in the care plan section) Acute Rehab OT Goals Patient Stated Goal: independent OT Goal Formulation: With patient Time For Goal Achievement: 05/17/20 Potential to Achieve Goals: Good  OT Frequency: Min 2X/week   Barriers to D/C:            Co-evaluation              AM-PAC OT "6 Clicks" Daily Activity     Outcome Measure Help from another person eating meals?: None Help from another person taking care of personal grooming?: A Little Help from another person toileting, which includes using toliet, bedpan, or urinal?: A Little Help from another person bathing (including washing, rinsing, drying)?: A Little Help from another person to put on and taking off regular upper body clothing?: A Little Help from another person to put on and taking off regular lower body clothing?: A Little 6 Click Score: 19   End of Session Equipment Utilized During Treatment: Rolling walker Nurse Communication: Mobility status;Precautions  Activity Tolerance: Patient tolerated treatment well Patient left: with call bell/phone within reach;with family/visitor present  OT Visit Diagnosis: Other abnormalities of gait and mobility (R26.89);Pain Pain - part of body:  (back)                Time: 1696-7893 OT Time Calculation (min): 20 min Charges:  OT General Charges $OT Visit: 1 Visit OT Evaluation $OT Eval Moderate Complexity: [place], OT Acute Rehabilitation Services Pager [phone] Office [phone]  Delight Stare 05/03/2020, 9:33 AM

## 2020-05-03 NOTE — Progress Notes (Signed)
Physical Therapy Treatment Patient Details Name: Juan Yang MRN: [id number] DOB: 12-20-1955 Today's Date: 05/03/2020    History of Present Illness pt is a 65 y/o male with severe right leg pain and back pain, s/p R L5 S1 TLIF on 05/02/2020, instrumentation, micro dissection and grafting.  PMHx, CMT and recent L L5-S1 fusion.    PT Comments    Pt progressing well with post-op mobility. He was able to demonstrate transfers and ambulation with gross min guard assist to supervision for safety and RW for support. Pt was educated on precautions, appropriate activity progression, and car transfer. Will continue to follow.      Follow Up Recommendations  Home health PT;Supervision - Intermittent;Supervision for mobility/OOB     Equipment Recommendations  Rolling walker with 5" wheels    Recommendations for Other Services       Precautions / Restrictions Precautions Precautions: Fall;Back Precaution Booklet Issued: Yes (comment) Precaution Comments: VC's for precautions throughout functional mobility. Restrictions Weight Bearing Restrictions: No    Mobility  Bed Mobility Overal bed mobility: Needs Assistance Bed Mobility: Rolling;Sidelying to Sit;Sit to Sidelying Rolling: Supervision Sidelying to sit: Supervision     Sit to sidelying: Supervision General bed mobility comments: Repetitive practice. VC's for step-by-step sequencing of log roll.    Transfers Overall transfer level: Needs assistance Equipment used: Rolling walker (2 wheeled) Transfers: Sit to/from Stand Sit to Stand: Supervision         General transfer comment: VC's for hand placement on seated surface for safety.  Ambulation/Gait Ambulation/Gait assistance: Min guard Gait Distance (Feet): 250 Feet Assistive device: Rolling walker (2 wheeled) Gait Pattern/deviations: Step-through pattern;Trunk flexed Gait velocity: Decreased Gait velocity interpretation: 1.31 - 2.62 ft/sec, indicative of limited  community ambulator General Gait Details: Slow but generally steady overall with RW for support. VC's throughout for improved posture, closer walker proximity, and forward gaze.   Stairs         General stair comments: Pt declined stair training, reported he was doing stairs fine prior to surgery and now feels better now than he did prior to surgery.   Wheelchair Mobility    Modified Rankin (Stroke Patients Only)       Balance Overall balance assessment: Needs assistance Sitting-balance support: Feet supported;No upper extremity supported Sitting balance-Leahy Scale: Fair     Standing balance support: No upper extremity supported;During functional activity Standing balance-Leahy Scale: Fair Standing balance comment: IT consultant with RW                            Cognition Arousal/Alertness: Awake/alert Behavior During Therapy: Impulsive;WFL for tasks assessed/performed Overall Cognitive Status: Within Functional Limits for tasks assessed                                        Exercises      General Comments        Pertinent Vitals/Pain Pain Assessment: Faces Faces Pain Scale: Hurts a little bit Pain Location: low back Pain Descriptors / Indicators: Pressure Pain Intervention(s): Limited activity within patient's tolerance;Monitored during session;Repositioned    Home Living Family/patient expects to be discharged to:: Private residence Living Arrangements: Spouse/significant other                  Prior Function            PT Goals (current goals can  now be found in the care plan section) Acute Rehab PT Goals Patient Stated Goal: independent PT Goal Formulation: With patient Time For Goal Achievement: 05/04/20 Potential to Achieve Goals: Good Progress towards PT goals: Progressing toward goals    Frequency    Min 5X/week      PT Plan Current plan remains appropriate    Co-evaluation               AM-PAC PT "6 Clicks" Mobility   Outcome Measure  Help needed turning from your back to your side while in a flat bed without using bedrails?: A Little Help needed moving from lying on your back to sitting on the side of a flat bed without using bedrails?: A Little Help needed moving to and from a bed to a chair (including a wheelchair)?: A Little Help needed standing up from a chair using your arms (e.g., wheelchair or bedside chair)?: A Little Help needed to walk in hospital room?: A Little Help needed climbing 3-5 steps with a railing? : A Little 6 Click Score: 18    End of Session Equipment Utilized During Treatment: Gait belt Activity Tolerance: Patient tolerated treatment well Patient left: in bed;with call bell/phone within reach;with family/visitor present Nurse Communication: Mobility status PT Visit Diagnosis: Unsteadiness on feet (R26.81);Other abnormalities of gait and mobility (R26.89);Pain Pain - part of body:  (incisional pain)     Time: 7989-2119 PT Time Calculation (min) (ACUTE ONLY): 16 min  Charges:  $Gait Training: 8-22 mins                     Juan Yang, PT, DPT Acute Rehabilitation Services Pager: [phone] Office: [phone]    Juan Yang 05/03/2020, 8:45 AM

## 2020-05-03 NOTE — TOC Initial Note (Signed)
Transition of Care [facility]) - Initial/Assessment Note    Patient Details  Name: Juan Yang MRN: [id number] Date of Birth: 1955-03-24  [facility] CM/SW Contact:    Joanne Chars, LCSW Phone Number: 05/03/2020, 11:54 AM  Clinical Narrative:    CSW met with pt regarding discharge plan.  Recommendation was for [facility] but pt declines this, but is interested in outpt PT.  Pt has been to [facility].  Permission to speak with wife present.  PCP in place.    CSW spoke with Lorriane Shire at [facility],  [phone].  She can call pt to schedule.  Needs order, face sheet, Op note faxed to [phone].  This was done.             Expected Discharge Plan: Home/Self Care Barriers to Discharge: No Barriers Identified   Patient Goals and CMS Choice Patient states their goals for this hospitalization and ongoing recovery are:: "be able to walk a reasonable distance again" CMS Medicare.gov Compare Post Acute Care list provided to::  (na)    Expected Discharge Plan and Services Expected Discharge Plan: Home/Self Care In-house Referral: Clinical Social Work   Post Acute Care Choice:  (pt chooses outpt PT) Living arrangements for the past 2 months: Single Family Home Expected Discharge Date: 05/03/20               DME Arranged: N/A (equipment provided by [facility] staff)         HH Arranged: Patient Refused [facility]          Prior Living Arrangements/Services Living arrangements for the past 2 months: Single Family Home Lives with:: Spouse Patient language and need for interpreter reviewed:: Yes Do you feel safe going back to the place where you live?: Yes      Need for Family Participation in Patient Care: No (Comment) Care giver support system in place?: Yes (comment) Current home services: Other (comment) (none) Criminal Activity/Legal Involvement Pertinent to Current Situation/Hospitalization: No - Comment as needed  Activities of Daily Living Home Assistive  Devices/Equipment: Eyeglasses,Blood pressure cuff ADL Screening (condition at time of admission) Patient's cognitive ability adequate to safely complete daily activities?: Yes Is the patient deaf or have difficulty hearing?: No Does the patient have difficulty seeing, even when wearing glasses/contacts?: No Does the patient have difficulty concentrating, remembering, or making decisions?: No Patient able to express need for assistance with ADLs?: Yes Does the patient have difficulty dressing or bathing?: No Independently performs ADLs?: Yes (appropriate for developmental age) Does the patient have difficulty walking or climbing stairs?: Yes Weakness of Legs: Both Weakness of Arms/Hands: None  Permission Sought/Granted Permission sought to share information with : Family Supports Permission granted to share information with : Yes, Verbal Permission Granted  Share Information with NAME: wife Clarene Critchley           Emotional Assessment Appearance:: Appears stated age Attitude/Demeanor/Rapport: Engaged Affect (typically observed): Appropriate,Pleasant Orientation: : Oriented to Self,Oriented to Place,Oriented to  Time,Oriented to Situation Alcohol / Substance Use: Not Applicable Psych Involvement: No (comment)  Admission diagnosis:  Lumbar radiculopathy [M54.16] Patient Active Problem List   Diagnosis Date Noted  . Lumbar radiculopathy 05/02/2020  . Guaiac positive stools 10/30/2016  . Guaiac + stool 10/29/2016  . 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 Peripheral neuropathy 10/29/2016   PCP:  Monico Blitz, MD Pharmacy:   [facility]  [place] [address] Phone: [phone] Fax: [phone]     Social Determinants of Health (SDOH) Interventions    Readmission Risk Interventions No flowsheet data found.

## 2020-05-03 NOTE — Progress Notes (Signed)
Patient is discharged from room 3C06 at this time. Alert and in stable condition. IV site d/c'd and instructions read to patient and spouse with understanding verbalized and all questions answered. Left unit via wheelchair with all belongings at side

## 2020-05-04 ENCOUNTER — Other Ambulatory Visit: Payer: Self-pay | Admitting: Radiology

## 2020-05-04 NOTE — Progress Notes (Deleted)
Office Visit Note  Patient: Juan Yang             Date of Birth: [birth year]           MRN: [id number]             PCP: Monico Blitz, MD Referring: Monico Blitz, MD Visit Date: 05/08/2020 Occupation: @GUAROCC @  Subjective:  No chief complaint on file.   History of Present Illness: Juan Yang is a 65 y.o. male ***   Activities of Daily Living:  Patient reports morning stiffness for *** {minute/hour:19697}.   Patient {ACTIONS;DENIES/REPORTS:21021675::"Denies"} nocturnal pain.  Difficulty dressing/grooming: {ACTIONS;DENIES/REPORTS:21021675::"Denies"} Difficulty climbing stairs: {ACTIONS;DENIES/REPORTS:21021675::"Denies"} Difficulty getting out of chair: {ACTIONS;DENIES/REPORTS:21021675::"Denies"} Difficulty using hands for taps, buttons, cutlery, and/or writing: {ACTIONS;DENIES/REPORTS:21021675::"Denies"}  No Rheumatology ROS completed.   PMFS History:  Patient Active Problem List   Diagnosis Date Noted  . Lumbar radiculopathy 05/02/2020  . Guaiac positive stools 10/30/2016  . Guaiac + stool 10/29/2016  . 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 Peripheral neuropathy 10/29/2016    Past Medical History:  Diagnosis Date  . AAA (abdominal aortic aneurysm) (HCC)    3.2 cm 02/01/18 CT, recommended [place] in 3 years  . Anxiety   . Arthritis   . 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 Charcot-Marie-Tooth disease type 2   . CKD (chronic kidney disease)   . Dyspnea   . Guaiac + stool 10/29/2016  . History of kidney stones   . Hypertension   . Peripheral neuropathy 10/29/2016    Family History  Problem Relation Age of Onset  . Charcot-Marie-Tooth disease Mother   . Heart disease Father   . COPD Father   . Arthritis Sister    Past Surgical History:  Procedure Laterality Date  . COLONOSCOPY WITH PROPOFOL N/A 11/14/2016   Procedure: COLONOSCOPY WITH PROPOFOL;  Surgeon: Rogene Houston, MD;  Location: [facility];  Service: Endoscopy;   Laterality: N/A;  9:15  . LUMBAR LAMINECTOMY/DECOMPRESSION MICRODISCECTOMY Right 05/24/2019   Procedure: LAMINECTOMY AND FORAMINOTOMY LUMBAR FIVE- SACRAL ONE RIGHT;  Surgeon: Vallarie Mare, MD;  Location: [place];  Service: Neurosurgery;  Laterality: Right;  posterior/right  . POLYPECTOMY  11/14/2016   Procedure: POLYPECTOMY- SIGMOID COLON X3 TRANSVERSE COLON X2;  Surgeon: Rogene Houston, MD;  Location: [facility];  Service: Endoscopy;;  . rt ankle surgery     Titanium hinge  . TONSILLECTOMY    . TRANSFORAMINAL LUMBAR INTERBODY FUSION (TLIF) WITH PEDICLE SCREW FIXATION 1 LEVEL N/A 05/02/2020   Procedure: Lumbar five Sacral one Transforaminal lumbar interbody fusion;  Surgeon: Vallarie Mare, MD;  Location: [place];  Service: Neurosurgery;  Laterality: N/A;   Social History   Social History Narrative   Right handed   Lives with wife in one story home    There is no immunization history on file for this patient.   Objective: Vital Signs: There were no vitals taken for this visit.   Physical Exam   Musculoskeletal Exam: ***  CDAI Exam: CDAI Score: - Patient Global: -; Provider Global: - Swollen: -; Tender: - Joint Exam 05/08/2020   No joint exam has been documented for this visit   There is currently no information documented on the homunculus. Go to the Rheumatology activity and complete the homunculus joint exam.  Investigation: No additional findings.  Imaging: DG Lumbar Spine 2-3 Views  Result Date: 05/02/2020 CLINICAL DATA:  Surgery, elective. Additional history provided: L5-S1 TLIF. EXAM: LUMBAR SPINE - 2-3  VIEW; DG C-ARM 1-60 MIN COMPARISON:  Intraoperative lateral view radiographs of the lumbar spine 05/24/2019. Radiographs of the lumbar spine 02/14/2020. Lumbar spine MRI 12/01/2019. FINDINGS: AP and lateral view intraoperative fluoroscopic images of the lumbosacral spine are submitted, 2 images total. The images demonstrate a posterior spinal fusion construct  (bilateral pedicle screws and vertical interconnecting rods) at L5-S1. An L5-S1 interbody device is also now present. No unexpected finding. IMPRESSION: Two intraoperative fluoroscopic images of the lumbosacral spine from L5-S1 TLIF, as described. Electronically Signed   By: Kellie Simmering DO   On: 05/02/2020 13:02   DG C-Arm 1-60 Min  Result Date: 05/02/2020 CLINICAL DATA:  Surgery, elective. Additional history provided: L5-S1 TLIF. EXAM: LUMBAR SPINE - 2-3 VIEW; DG C-ARM 1-60 MIN COMPARISON:  Intraoperative lateral view radiographs of the lumbar spine 05/24/2019. Radiographs of the lumbar spine 02/14/2020. Lumbar spine MRI 12/01/2019. FINDINGS: AP and lateral view intraoperative fluoroscopic images of the lumbosacral spine are submitted, 2 images total. The images demonstrate a posterior spinal fusion construct (bilateral pedicle screws and vertical interconnecting rods) at L5-S1. An L5-S1 interbody device is also now present. No unexpected finding. IMPRESSION: Two intraoperative fluoroscopic images of the lumbosacral spine from L5-S1 TLIF, as described. Electronically Signed   By: Kellie Simmering DO   On: 05/02/2020 13:02   DG C-Arm 1-60 Min-No Report  Result Date: 05/02/2020 Fluoroscopy was utilized by the requesting physician.  No radiographic interpretation.    Recent Labs: Lab Results  Component Value Date   WBC 11.4 (H) 05/01/2020   HGB 14.2 05/01/2020   PLT 261 05/01/2020   NA 137 05/01/2020   K 4.0 05/01/2020   CL 103 05/01/2020   CO2 28 05/01/2020   GLUCOSE 87 05/01/2020   BUN 11 05/01/2020   CREATININE 1.54 (H) 05/01/2020   BILITOT 0.4 04/11/2020   ALKPHOS 144 (H) 10/10/2019   AST 14 04/11/2020   ALT 13 04/11/2020   PROT 7.0 04/11/2020   PROT 7.0 04/11/2020   ALBUMIN 4.4 10/10/2019   CALCIUM 9.8 05/01/2020   GFRAA 50 (L) 04/11/2020   QFTBGOLDPLUS NEGATIVE 04/11/2020   April 11, 2020 UA negative, IFE a faint monoclonal free lambda light chain is detected, immunoglobulins  normal, HIV negative, hepatitis B-, hepatitis C negative, TB Gold negative, TPMT normal, CK 85 ANCA negative, ANA 1: 160NS, RF negative, cryoglobulins negative sed rate 19,   Speciality Comments: No specialty comments available.  Procedures:  No procedures performed Allergies: Other, Atenolol, Cymbalta [duloxetine hcl], Latex, and Penicillins   Assessment / Plan:     Visit Diagnoses: No diagnosis found.  Orders: No orders of the defined types were placed in this encounter.  No orders of the defined types were placed in this encounter.   Face-to-face time spent with patient was *** minutes. Greater than 50% of time was spent in counseling and coordination of care.  Follow-Up Instructions: No follow-ups on file.   Bo Merino, MD  Note - This record has been created using Editor, commissioning.  Chart creation errors have been sought, but may not always  have been located. Such creation errors do not reflect on  the standard of medical care.

## 2020-05-08 ENCOUNTER — Ambulatory Visit: Payer: Medicare HMO | Admitting: Rheumatology

## 2020-05-08 DIAGNOSIS — G894 Chronic pain syndrome: Secondary | ICD-10-CM

## 2020-05-08 DIAGNOSIS — R21 Rash and other nonspecific skin eruption: Secondary | ICD-10-CM

## 2020-05-08 DIAGNOSIS — F32A Depression, unspecified: Secondary | ICD-10-CM

## 2020-05-08 DIAGNOSIS — R768 Other specified abnormal immunological findings in serum: Secondary | ICD-10-CM

## 2020-05-08 DIAGNOSIS — G600 Hereditary motor and sensory neuropathy: Secondary | ICD-10-CM

## 2020-05-08 DIAGNOSIS — R778 Other specified abnormalities of plasma proteins: Secondary | ICD-10-CM

## 2020-05-08 DIAGNOSIS — R7989 Other specified abnormal findings of blood chemistry: Secondary | ICD-10-CM

## 2020-05-08 DIAGNOSIS — L509 Urticaria, unspecified: Secondary | ICD-10-CM

## 2020-05-08 DIAGNOSIS — F17200 Nicotine dependence, unspecified, uncomplicated: Secondary | ICD-10-CM

## 2020-05-08 DIAGNOSIS — I10 Essential (primary) hypertension: Secondary | ICD-10-CM

## 2020-05-08 DIAGNOSIS — G629 Polyneuropathy, unspecified: Secondary | ICD-10-CM

## 2020-05-08 DIAGNOSIS — M5136 Other intervertebral disc degeneration, lumbar region: Secondary | ICD-10-CM

## 2020-05-08 DIAGNOSIS — E7800 Pure hypercholesterolemia, unspecified: Secondary | ICD-10-CM

## 2020-05-08 DIAGNOSIS — G8929 Other chronic pain: Secondary | ICD-10-CM

## 2020-05-08 DIAGNOSIS — Z8709 Personal history of other diseases of the respiratory system: Secondary | ICD-10-CM

## 2020-05-09 DIAGNOSIS — J449 Chronic obstructive pulmonary disease, unspecified: Secondary | ICD-10-CM | POA: Diagnosis not present

## 2020-05-09 DIAGNOSIS — Z720 Tobacco use: Secondary | ICD-10-CM | POA: Diagnosis not present

## 2020-05-09 DIAGNOSIS — N183 Chronic kidney disease, stage 3 unspecified: Secondary | ICD-10-CM | POA: Diagnosis not present

## 2020-05-09 DIAGNOSIS — I10 Essential (primary) hypertension: Secondary | ICD-10-CM | POA: Diagnosis not present

## 2020-05-10 DIAGNOSIS — G459 Transient cerebral ischemic attack, unspecified: Secondary | ICD-10-CM

## 2020-05-10 HISTORY — DX: Transient cerebral ischemic attack, unspecified: G45.9

## 2020-05-11 ENCOUNTER — Other Ambulatory Visit: Payer: Self-pay | Admitting: *Deleted

## 2020-05-11 DIAGNOSIS — R7989 Other specified abnormal findings of blood chemistry: Secondary | ICD-10-CM | POA: Diagnosis not present

## 2020-05-11 DIAGNOSIS — G894 Chronic pain syndrome: Secondary | ICD-10-CM | POA: Diagnosis not present

## 2020-05-11 DIAGNOSIS — M329 Systemic lupus erythematosus, unspecified: Secondary | ICD-10-CM | POA: Diagnosis not present

## 2020-05-11 DIAGNOSIS — R899 Unspecified abnormal finding in specimens from other organs, systems and tissues: Secondary | ICD-10-CM

## 2020-05-11 DIAGNOSIS — Z79899 Other long term (current) drug therapy: Secondary | ICD-10-CM

## 2020-05-11 DIAGNOSIS — IMO0002 Reserved for concepts with insufficient information to code with codable children: Secondary | ICD-10-CM

## 2020-05-11 HISTORY — PX: MOHS SURGERY: SUR867

## 2020-05-14 NOTE — Progress Notes (Signed)
Please check the status of lupus anticoagulant.  I do not see the results.  We also ordered anti-DNA antibody not anti-DNAase B antibody.  If possible please cancel anti-DNase B antibody and add anti-DNA antibody

## 2020-05-15 ENCOUNTER — Ambulatory Visit (HOSPITAL_COMMUNITY): Payer: Medicare HMO | Admitting: Hematology

## 2020-05-15 LAB — LUPUS ANTICOAGULANT EVAL W/ REFLEX
PTT-LA Screen: 55 s — ABNORMAL HIGH (ref <=40)
dRVVT: 56 s — ABNORMAL HIGH (ref <=45)

## 2020-05-15 LAB — C3 AND C4
C3 Complement: 152 mg/dL (ref 82–185)
C4 Complement: 52 mg/dL (ref 15–53)

## 2020-05-15 LAB — ANTI-DNASE B ANTIBODY: Anti-DNAse-B: 113 U/mL (ref <301)

## 2020-05-15 LAB — BETA-2 GLYCOPROTEIN ANTIBODIES
Beta-2 Glyco 1 IgA: >65 U/mL — ABNORMAL HIGH
Beta-2 Glyco 1 IgM: 14.8 U/mL
Beta-2 Glyco I IgG: 19.9 U/mL

## 2020-05-15 LAB — TEST AUTHORIZATION

## 2020-05-15 LAB — ANTI-DNA ANTIBODY, DOUBLE-STRANDED: ds DNA Ab: <1 IU/mL

## 2020-05-15 LAB — ANTI-SCLERODERMA ANTIBODY: Scleroderma (Scl-70) (ENA) Antibody, IgG: <1 AI

## 2020-05-15 LAB — CARDIOLIPIN ANTIBODIES, IGG, IGM, IGA
Anticardiolipin IgA: 60.2 APL-U/mL — ABNORMAL HIGH
Anticardiolipin IgG: 17.5 GPL-U/mL
Anticardiolipin IgM: 7.7 MPL-U/mL

## 2020-05-15 LAB — ANTI-SMITH ANTIBODY: ENA SM Ab Ser-aCnc: <1 AI

## 2020-05-15 LAB — RNP ANTIBODY: Ribonucleic Protein(ENA) Antibody, IgG: <1 AI

## 2020-05-15 LAB — RFLX DRVVT CONFRIM: DRVVT CONFIRM: POSITIVE — AB

## 2020-05-15 LAB — RFLX HEXAGONAL PHASE CONFIRM: Hexagonal Phase Conf: NEGATIVE

## 2020-05-15 LAB — SJOGRENS SYNDROME-A EXTRACTABLE NUCLEAR ANTIBODY: SSA (Ro) (ENA) Antibody, IgG: <1 AI

## 2020-05-15 LAB — SJOGRENS SYNDROME-B EXTRACTABLE NUCLEAR ANTIBODY: SSB (La) (ENA) Antibody, IgG: <1 AI

## 2020-05-15 LAB — RFX DRVVT 1:1 MIX

## 2020-05-15 NOTE — Progress Notes (Signed)
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  Lupus anticoagulant is still pending.  Please notify patient and refer him to hematology for evaluation and treatment.

## 2020-05-16 NOTE — Progress Notes (Signed)
Lupus anticoagulant is negative.

## 2020-05-30 ENCOUNTER — Ambulatory Visit (HOSPITAL_COMMUNITY)
Admission: RE | Admit: 2020-05-30 | Discharge: 2020-05-30 | Disposition: A | Discharge status: Home / Self Care | Payer: Medicare HMO | Source: Ambulatory Visit | Attending: Physician Assistant | Admitting: Physician Assistant

## 2020-05-30 ENCOUNTER — Other Ambulatory Visit: Payer: Self-pay

## 2020-05-30 ENCOUNTER — Inpatient Hospital Stay (HOSPITAL_COMMUNITY): Payer: Medicare HMO

## 2020-05-30 ENCOUNTER — Inpatient Hospital Stay (HOSPITAL_COMMUNITY): Payer: Medicare HMO | Attending: Hematology | Admitting: Hematology

## 2020-05-30 VITALS — BP 141/90 | HR 87 | Temp 96.8°F | Resp 18 | Ht 72.0 in | Wt 225.0 lb

## 2020-05-30 DIAGNOSIS — D472 Monoclonal gammopathy: Secondary | ICD-10-CM | POA: Insufficient documentation

## 2020-05-30 DIAGNOSIS — M7989 Other specified soft tissue disorders: Secondary | ICD-10-CM | POA: Diagnosis not present

## 2020-05-30 DIAGNOSIS — D6861 Antiphospholipid syndrome: Secondary | ICD-10-CM | POA: Insufficient documentation

## 2020-05-30 DIAGNOSIS — Z8261 Family history of arthritis: Secondary | ICD-10-CM | POA: Insufficient documentation

## 2020-05-30 DIAGNOSIS — G894 Chronic pain syndrome: Secondary | ICD-10-CM | POA: Diagnosis not present

## 2020-05-30 DIAGNOSIS — R42 Dizziness and giddiness: Secondary | ICD-10-CM | POA: Insufficient documentation

## 2020-05-30 DIAGNOSIS — Z8249 Family history of ischemic heart disease and other diseases of the circulatory system: Secondary | ICD-10-CM | POA: Insufficient documentation

## 2020-05-30 DIAGNOSIS — R7989 Other specified abnormal findings of blood chemistry: Secondary | ICD-10-CM

## 2020-05-30 DIAGNOSIS — M549 Dorsalgia, unspecified: Secondary | ICD-10-CM | POA: Diagnosis not present

## 2020-05-30 DIAGNOSIS — Z836 Family history of other diseases of the respiratory system: Secondary | ICD-10-CM | POA: Diagnosis not present

## 2020-05-30 DIAGNOSIS — D72829 Elevated white blood cell count, unspecified: Secondary | ICD-10-CM

## 2020-05-30 DIAGNOSIS — M2550 Pain in unspecified joint: Secondary | ICD-10-CM | POA: Insufficient documentation

## 2020-05-30 DIAGNOSIS — R768 Other specified abnormal immunological findings in serum: Secondary | ICD-10-CM | POA: Diagnosis not present

## 2020-05-30 DIAGNOSIS — Z79899 Other long term (current) drug therapy: Secondary | ICD-10-CM | POA: Diagnosis not present

## 2020-05-30 DIAGNOSIS — E669 Obesity, unspecified: Secondary | ICD-10-CM | POA: Diagnosis not present

## 2020-05-30 DIAGNOSIS — Z8489 Family history of other specified conditions: Secondary | ICD-10-CM

## 2020-05-30 DIAGNOSIS — I129 Hypertensive chronic kidney disease with stage 1 through stage 4 chronic kidney disease, or unspecified chronic kidney disease: Secondary | ICD-10-CM | POA: Diagnosis not present

## 2020-05-30 DIAGNOSIS — G629 Polyneuropathy, unspecified: Secondary | ICD-10-CM | POA: Diagnosis not present

## 2020-05-30 DIAGNOSIS — R0602 Shortness of breath: Secondary | ICD-10-CM

## 2020-05-30 DIAGNOSIS — Z716 Tobacco abuse counseling: Secondary | ICD-10-CM | POA: Diagnosis not present

## 2020-05-30 DIAGNOSIS — E872 Acidosis: Secondary | ICD-10-CM | POA: Diagnosis not present

## 2020-05-30 DIAGNOSIS — R21 Rash and other nonspecific skin eruption: Secondary | ICD-10-CM | POA: Insufficient documentation

## 2020-05-30 DIAGNOSIS — N1831 Chronic kidney disease, stage 3a: Secondary | ICD-10-CM | POA: Diagnosis not present

## 2020-05-30 LAB — CBC WITH DIFFERENTIAL/PLATELET
Abs Immature Granulocytes: 0.04 10*3/uL (ref 0.00–0.07)
Basophils Absolute: 0.1 10*3/uL (ref 0.0–0.1)
Basophils Relative: 1 %
Eosinophils Absolute: 0.4 10*3/uL (ref 0.0–0.5)
Eosinophils Relative: 3 %
HCT: 41 % (ref 39.0–52.0)
Hemoglobin: 13.7 g/dL (ref 13.0–17.0)
Immature Granulocytes: 0 %
Lymphocytes Relative: 27 %
Lymphs Abs: 3.1 10*3/uL (ref 0.7–4.0)
MCH: 31.9 pg (ref 26.0–34.0)
MCHC: 33.4 g/dL (ref 30.0–36.0)
MCV: 95.6 fL (ref 80.0–100.0)
Monocytes Absolute: 0.7 10*3/uL (ref 0.1–1.0)
Monocytes Relative: 6 %
Neutro Abs: 7.1 10*3/uL (ref 1.7–7.7)
Neutrophils Relative %: 63 %
Platelets: 291 10*3/uL (ref 150–400)
RBC: 4.29 MIL/uL (ref 4.22–5.81)
RDW: 12.8 % (ref 11.5–15.5)
WBC: 11.4 10*3/uL — ABNORMAL HIGH (ref 4.0–10.5)
nRBC: 0 % (ref 0.0–0.2)

## 2020-05-30 LAB — COMPREHENSIVE METABOLIC PANEL
ALT: 19 U/L (ref 0–44)
AST: 18 U/L (ref 15–41)
Albumin: 3.9 g/dL (ref 3.5–5.0)
Alkaline Phosphatase: 150 U/L — ABNORMAL HIGH (ref 38–126)
Anion gap: 9 (ref 5–15)
BUN: 13 mg/dL (ref 8–23)
CO2: 24 mmol/L (ref 22–32)
Calcium: 9.3 mg/dL (ref 8.9–10.3)
Chloride: 103 mmol/L (ref 98–111)
Creatinine, Ser: 1.47 mg/dL — ABNORMAL HIGH (ref 0.61–1.24)
GFR, Estimated: 53 mL/min — ABNORMAL LOW (ref >60)
Glucose, Bld: 91 mg/dL (ref 70–99)
Potassium: 3.7 mmol/L (ref 3.5–5.1)
Sodium: 136 mmol/L (ref 135–145)
Total Bilirubin: 0.6 mg/dL (ref 0.3–1.2)
Total Protein: 7.5 g/dL (ref 6.5–8.1)

## 2020-05-30 LAB — LACTATE DEHYDROGENASE: LDH: 142 U/L (ref 98–192)

## 2020-05-30 NOTE — Progress Notes (Signed)
[name], [name] [address]   CLINIC:  Medical Oncology/Hematology  CONSULT NOTE  Patient Care Team: Monico Blitz, MD as PCP - General (Internal Medicine)  CHIEF COMPLAINTS/PURPOSE OF CONSULTATION:  Abnormal labs, per rheumatology  HISTORY OF PRESENTING ILLNESS:  Juan Yang 65 y.o. male is here at the request of Dr. Estanislado Pandy (rheumatology) for further investigation of abnormal lab values.  The patient has been worked up by rheumatology for a rash concerning for possible vasculitis.  During this work-up, he was found to be positive for IgA anticardiolipin antibody (60.2) and IgA antibeta-2 glycoprotein 1 antibody (> 65.0) and was referred to hematology to evaluate clotting risk.  Review of patient's lab results also show leukocytosis (WBC 11.4, ANC 7.2, eosinophils 0.6), as well as SPEP with a poorly-defined band of restricted protein mobility in the gammaglobulin region, that may represent a monoclonal protein.  IFE detected faint monoclonal free lambda light chain.  Rheumatology work-up also showed patient to be ANA positive (negative for lupus anticoagulant).  Creatinine 1.54, hemoglobin 14.2, and calcium 9.8.  Patient denies any previous history of clotting with DVT or PE.  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 Tomi Yang) did not favor diagnosis of true TIA per his note from 10/07/2019.  There is no family history of blood clots, although his mother did have 1 miscarriage.  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  Patient notes that he was told that he had a "erosion in his tibia" in 2008, which was monitored at [facility].  Repeat imaging was obtained at [facility], and he was told that it was stable a year later.  It has not been followed up since that time.  Patient denies any new bone pains, but does have chronic joint pain and more recent pain related to recent lumbar fusion surgery.  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  He does report recent onset of numbness and tingling in his hands, stating that they "fall asleep more easily than they used to," but this resolves quickly by changing position.  He reports "floaters" that frequently cause transient blurry vision in his eyes.  He has chronic ataxia, but reports worsening balance over the past few months.  He also has recently been found to have CKD which is under work-up, possibly secondary to underlying hypertension.  Regarding his leukocytosis, the patient has not had oral steroids since 4 months ago.  He does not use steroid creams.  He does have occasional tobacco use, smoking about 1 pack of cigarettes every 2 weeks.  He does vape 2-3 times per day.  He reports fatigue, with energy noted at 40%.  Reports that his appetite "comes and goes," but he denies any unintentional weight loss.  No fever, chills, night sweats.  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  He is disabled due to chronic pain syndrome, Charcot-Marie-Tooth disease, and CPRS/neuropathy of right lower extremity.  Patient used to smoke 2.5 PPD cigarettes (from 1978 through 2012).  He currently smokes about 1 cigarette/day.  He vapes 2-3  times per day.  No alcohol or drugs.   No family history of blood clots, no family history of cancer.  Both parents had osteoarthritis. Nephew with RA and vasculitis.   MEDICAL HISTORY:  Past Medical History:  Diagnosis Date  . AAA (abdominal aortic aneurysm) (HCC)    3.2 cm 02/01/18 CT, recommended [place] in 3 years  . Anxiety   . Arthritis   . Charcot Marie Tooth  muscular atrophy 10/29/2016  . Charcot-Marie-Tooth disease type 2   . CKD (chronic kidney disease)   . Dyspnea   . Guaiac + stool 10/29/2016  . History of kidney stones   . Hypertension   . Peripheral neuropathy 10/29/2016    SURGICAL HISTORY: Past Surgical History:  Procedure Laterality Date  . COLONOSCOPY WITH PROPOFOL N/A 11/14/2016   Procedure: COLONOSCOPY WITH PROPOFOL;  Surgeon: Rogene Houston, MD;  [facility];  Service: Endoscopy;  Laterality: N/A;  9:15  . LUMBAR LAMINECTOMY/DECOMPRESSION MICRODISCECTOMY Right 05/24/2019   Procedure: LAMINECTOMY AND FORAMINOTOMY LUMBAR FIVE- SACRAL ONE RIGHT;  Surgeon: Vallarie Mare, MD;  Location: [place];  Service: Neurosurgery;  Laterality: Right;  posterior/right  . POLYPECTOMY  11/14/2016   Procedure: POLYPECTOMY- SIGMOID COLON X3 TRANSVERSE COLON X2;  Surgeon: Rogene Houston, MD;  [facility];  Service: Endoscopy;;  . rt ankle surgery     Titanium hinge  . TONSILLECTOMY    . TRANSFORAMINAL LUMBAR INTERBODY FUSION (TLIF) WITH PEDICLE SCREW FIXATION 1 LEVEL N/A 05/02/2020   Procedure: Lumbar five Sacral one Transforaminal lumbar interbody fusion;  Surgeon: Vallarie Mare, MD;  Location: [place];  Service: Neurosurgery;  Laterality: N/A;    SOCIAL HISTORY: Social History   Socioeconomic History  . Marital status: Married    Spouse name: Not on file  . Number of children: Not on file  . Years of education: Not on file  . Highest education level: Not on file  Occupational History  . Not on file  Tobacco Use  . Smoking status: Former Smoker    Packs/day: 1.50    Years: 20.00    Pack years: 30.00    Types: Cigarettes    Quit date: 11/12/2012    Years since quitting: 7.5  . Smokeless tobacco: Never Used  Vaping Use  . Vaping Use: Some days  Substance and Sexual Activity  . Alcohol use: No  . Drug use: No  . Sexual activity: Yes    Birth control/protection: None  Other Topics Concern  . Not on file   Social History Narrative   Right handed   Lives with wife in one story home   Social Determinants of Health   Financial Resource Strain: Not on file  Food Insecurity: Not on file  Transportation Needs: No Transportation Needs  . Lack of Transportation (Medical): No  . Lack of Transportation (Non-Medical): No  Physical Activity: Inactive  . Days of Exercise per Week: 0 days  . Minutes of Exercise per Session: 0 min  Stress: Not on file  Social Connections: Not on file  Intimate Partner Violence: Not At Risk  . Fear of Current or Ex-Partner: No  . Emotionally Abused: No  . Physically Abused: No  . Sexually Abused: No    FAMILY HISTORY: Family History  Problem Relation Age of Onset  . Charcot-Marie-Tooth disease Mother   . Heart disease Father   . COPD Father   . Arthritis Sister     ALLERGIES:  is allergic to other, atenolol,  cymbalta [duloxetine hcl], latex, and penicillins.  MEDICATIONS:  Current Outpatient Medications  Medication Sig Dispense Refill  . amLODipine (NORVASC) 2.5 MG tablet Take 2.5 mg by mouth 2 (two) times daily.    Marland Kitchen aspirin 325 MG tablet Take by mouth.    Marland Kitchen buPROPion (WELLBUTRIN XL) 300 MG 24 hr tablet Take 300 mg by mouth daily.    . Ca Carbonate-Mag Hydroxide (ROLAIDS PO) Take 2-3 tablets by mouth daily as needed (heartburn).    . clobetasol cream (TEMOVATE) 4.01 % Apply 1 application topically 2 (two) times daily as needed (rash).    . cyclobenzaprine (FLEXERIL) 10 MG tablet Take 1 tablet (10 mg total) by mouth 3 (three) times daily as needed for muscle spasms. 30 tablet 0  . docusate sodium (COLACE) 100 MG capsule Take 1 capsule (100 mg total) by mouth 2 (two) times daily. 10 capsule 0  . gabapentin (NEURONTIN) 300 MG capsule Take 300 mg by mouth 3 (three) times daily. Additional 300 mg if needed for pain    . Glucosamine HCl-MSM (GLUCOSAMINE-MSM PO) Take 1 tablet by mouth daily.    . metoprolol succinate (TOPROL-XL) 25 MG 24 hr tablet Take 12.5  mg by mouth daily.    . nortriptyline (PAMELOR) 25 MG capsule Take 25 mg by mouth at bedtime.    . Omega-3 1000 MG CAPS Take 1,000 mg by mouth daily.    Marland Kitchen oxyCODONE-acetaminophen (PERCOCET/ROXICET) 5-325 MG tablet Take 1-2 tablets by mouth every 6 (six) hours as needed for moderate pain. 40 tablet 0  . Potassium 99 MG TABS Take 99 mg by mouth daily.    . sildenafil (VIAGRA) 100 MG tablet Take 100 mg by mouth daily as needed for erectile dysfunction.    Marland Kitchen tiZANidine (ZANAFLEX) 4 MG tablet Take by mouth.     No current facility-administered medications for this visit.    REVIEW OF SYSTEMS:   Review of Systems  Constitutional: Positive for appetite change and fatigue. Negative for chills, diaphoresis, fever and unexpected weight change.  HENT:   Negative for lump/mass and nosebleeds.   Eyes: Negative for eye problems.  Respiratory: Positive for shortness of breath (Dyspnea on exertion). Negative for cough and hemoptysis.   Cardiovascular: Positive for leg swelling (Chronic dependent edema). Negative for chest pain and palpitations.  Gastrointestinal: Negative for abdominal pain, blood in stool, constipation, diarrhea, nausea and vomiting.  Genitourinary: Negative for hematuria.   Musculoskeletal: Positive for arthralgias and back pain.  Skin: Negative.   Neurological: Positive for dizziness (Loss of balance) and numbness (Feet). Negative for headaches and light-headedness.  Hematological: Does not bruise/bleed easily.      PHYSICAL EXAMINATION: ECOG PERFORMANCE STATUS: 1 - Symptomatic but completely ambulatory  Vitals:   05/30/20 1248  BP: (!) 141/90  Pulse: 87  Resp: 18  Temp: (!) 96.8 F (36 C)  SpO2: 97%   Filed Weights   05/30/20 1248  Weight: 225 lb (102.1 kg)    Physical Exam Constitutional:      Appearance: Normal appearance. He is obese.  HENT:     Head: Normocephalic and atraumatic.     Mouth/Throat:     Mouth: Mucous membranes are moist.  Eyes:      Extraocular Movements: Extraocular movements intact.     Pupils: Pupils are equal, round, and reactive to light.  Cardiovascular:     Rate and Rhythm: Normal rate and regular rhythm.     Pulses: Normal pulses.     Heart sounds: Normal heart sounds.  Pulmonary:     Effort: Pulmonary effort is normal.     Breath sounds: Normal breath sounds.  Abdominal:     General: Bowel sounds are normal.     Palpations: Abdomen is soft.     Tenderness: There is no abdominal tenderness.  Musculoskeletal:        General: No swelling.     Right lower leg: No edema.     Left lower leg: No edema.  Lymphadenopathy:     Cervical: No cervical adenopathy.  Skin:    General: Skin is warm and dry.  Neurological:     General: No focal deficit present.     Mental Status: He is alert and oriented to person, place, and time.  Psychiatric:        Mood and Affect: Mood normal.        Behavior: Behavior normal.       LABORATORY DATA:  I have reviewed the data as listed Recent Results (from the past 2160 hour(s))  CBC with Differential/Platelet     Status: Abnormal   Collection Time: 04/11/20 10:01 AM  Result Value Ref Range   WBC 12.0 (H) 3.8 - 10.8 Thousand/uL   RBC 4.65 4.20 - 5.80 Million/uL   Hemoglobin 14.9 13.2 - 17.1 g/dL   HCT 42.8 38.5 - 50.0 %   MCV 92.0 80.0 - 100.0 fL   MCH 32.0 27.0 - 33.0 pg   MCHC 34.8 32.0 - 36.0 g/dL   RDW 12.3 11.0 - 15.0 %   Platelets 296 140 - 400 Thousand/uL   MPV 10.1 7.5 - 12.5 fL   Neutro Abs 7,176 1,500 - 7,800 cells/uL   Lymphs Abs 3,252 850 - 3,900 cells/uL   Absolute Monocytes 780 200 - 950 cells/uL   Eosinophils Absolute 636 (H) 15 - 500 cells/uL   Basophils Absolute 156 0 - 200 cells/uL   Neutrophils Relative % 59.8 %   Total Lymphocyte 27.1 %   Monocytes Relative 6.5 %   Eosinophils Relative 5.3 %   Basophils Relative 1.3 %  COMPLETE METABOLIC PANEL WITH GFR     Status: Abnormal   Collection Time: 04/11/20 10:01 AM  Result Value Ref Range    Glucose, Bld 88 65 - 99 mg/dL    Comment: .            Fasting reference interval .    BUN 11 7 - 25 mg/dL   Creat 1.64 (H) 0.70 - 1.25 mg/dL    Comment: For patients >37 years of age, the reference limit for Creatinine is approximately 13% higher for people identified as African-American. .    GFR, Est Non African American 44 (L) > OR = 60 mL/min/1.23m   GFR, Est African American 50 (L) > OR = 60 mL/min/1.761m  BUN/Creatinine Ratio 7 6 - 22 (calc)   Sodium 137 135 - 146 mmol/L   Potassium 4.1 3.5 - 5.3 mmol/L   Chloride 103 98 - 110 mmol/L   CO2 18 (L) 20 - 32 mmol/L   Calcium 9.1 8.6 - 10.3 mg/dL   Total Protein 7.0 6.1 - 8.1 g/dL   Albumin 4.1 3.6 - 5.1 g/dL   Globulin 2.9 1.9 - 3.7 g/dL (calc)   AG Ratio 1.4 1.0 - 2.5 (calc)   Total Bilirubin 0.4 0.2 - 1.2 mg/dL   Alkaline phosphatase (APISO) 113 35 - 144 U/L   AST 14 10 - 35 U/L   ALT 13 9 - 46 U/L  Urinalysis,  Routine w reflex microscopic     Status: None   Collection Time: 04/11/20 10:01 AM  Result Value Ref Range   Color, Urine YELLOW YELLOW   APPearance CLEAR CLEAR   Specific Gravity, Urine 1.009 1.001 - 1.03   pH 5.5 5.0 - 8.0   Glucose, UA NEGATIVE NEGATIVE   Bilirubin Urine NEGATIVE NEGATIVE   Ketones, ur NEGATIVE NEGATIVE   Hgb urine dipstick NEGATIVE NEGATIVE   Protein, ur NEGATIVE NEGATIVE   Nitrite NEGATIVE NEGATIVE   Leukocytes,Ua NEGATIVE NEGATIVE  CK     Status: None   Collection Time: 04/11/20 10:01 AM  Result Value Ref Range   Total CK 85 44 - 196 U/L  Sedimentation rate     Status: None   Collection Time: 04/11/20 10:01 AM  Result Value Ref Range   Sed Rate 19 0 - 20 mm/h  Rheumatoid factor     Status: None   Collection Time: 04/11/20 10:01 AM  Result Value Ref Range   Rhuematoid fact SerPl-aCnc <14 <14 IU/mL  ANA     Status: Abnormal   Collection Time: 04/11/20 10:01 AM  Result Value Ref Range   Anti Nuclear Antibody (ANA) POSITIVE (A) NEGATIVE    Comment: ANA IFA is a first line  screen for detecting the presence of up to approximately 150 autoantibodies in various autoimmune diseases. A positive ANA IFA result is suggestive of autoimmune disease and reflexes to titer and pattern. Further laboratory testing may be considered if clinically indicated. . For additional information, please refer to http://education.[facility].com/faq/FAQ177 (This link is being provided for informational/ educational purposes only.) .   Pan-ANCA     Status: None   Collection Time: 04/11/20 10:01 AM  Result Value Ref Range   ANCA Screen NEGATIVE NEGATIVE    Comment: ANCA Screen includes evaluation for p-ANCA, c-ANCA and atypical p-ANCA. A positive ANCA screen reflexes to titer and pattern(s), e.g., cytoplasmic pattern (c-ANCA), perinuclear pattern (p-ANCA), or atypical p-ANCA pattern.  c-ANCA and p-ANCA are observed in vasculitis, whereas atypical p-ANCA is observed in IBD (Inflammatory Bowel Disease). Atypical p-ANCA is  detected in about 55% to 80% of patients with ulcerative colitis but only 5% to 25% of patients with Crohn's disease. .    Myeloperoxidase Abs <1.0 AI    Comment:      Value        Interpretation      -----        --------------      <1.0         No Antibody Detected      > or = 1.0   Antibody Detected . Autoantibodies to myeloperoxidase (MPO) are commonly associated with the following small-vessel vasculitides: microscopic polyangiitis, polyarteritis nodosa, Churg-Strauss syndrome, necrotizing and crescentic glomerulonephritis and occasionally granulomatosis with polyangiitis (GPA, Wegener's). The perinuclear IFA pattern, (p-ANCA) is based largely on autoantibody to  myeloperoxidase which serves as the primary antigen. These autoantibodies are present in active disease. .    Serine Protease 3 <1.0 AI    Comment:      Value        Interpretation      -----        --------------      <1.0         No Antibody Detected      > or = 1.0    Antibody Detected . Autoantibodies to proteinase-3 (PR-3) are accepted as characteristic for granulomatosis with polyangiitis (GPA, Wegener's), and are detectable in 95% of  the histologically proven cases. The cytoplasmic IFA pattern, (c-ANCA), is based largely on autoantibody to PR-3 which serves as the primary antigen. These autoantibodies are present in active disease. .   Hepatitis B core antibody, IgM     Status: None   Collection Time: 04/11/20 10:01 AM  Result Value Ref Range   Hep B C IgM NON-REACTIVE NON-REACTI  Hepatitis B surface antigen     Status: None   Collection Time: 04/11/20 10:01 AM  Result Value Ref Range   Hepatitis B Surface Ag NON-REACTIVE NON-REACTI  Hepatitis C antibody     Status: None   Collection Time: 04/11/20 10:01 AM  Result Value Ref Range   Hepatitis C Ab NON-REACTIVE NON-REACTI   SIGNAL TO CUT-OFF 0.01 <1.00    Comment: . HCV antibody was non-reactive. There is no laboratory  evidence of HCV infection. . In most cases, no further action is required. However, if recent HCV exposure is suspected, a test for HCV RNA (test code [phone]) is suggested. . For additional information please refer to http://education.[facility].com/faq/FAQ22v1 (This link is being provided for informational/ educational purposes only.) .   HIV Antibody (routine testing w rflx)     Status: None   Collection Time: 04/11/20 10:01 AM  Result Value Ref Range   HIV 1&2 Ab, 4th Generation NON-REACTIVE NON-REACTI    Comment: HIV-1 antigen and HIV-1/HIV-2 antibodies were not detected. There is no laboratory evidence of HIV infection. Marland Kitchen PLEASE NOTE: This information has been disclosed to you from records whose confidentiality may be protected by state law.  If your state requires such protection, then the state law prohibits you from making any further disclosure of the information without the specific written consent of the person to whom it pertains, or as  otherwise permitted by law. A general authorization for the release of medical or other information is NOT sufficient for this purpose. . For additional information please refer to http://education.[facility].com/faq/FAQ106 (This link is being provided for informational/ educational purposes only.) . Marland Kitchen The performance of this assay has not been clinically validated in patients less than 55 years old. Ronney Asters Plus     Status: None   Collection Time: 04/11/20 10:01 AM  Result Value Ref Range   QuantiFERON-TB Gold Plus NEGATIVE NEGATIVE    Comment: Negative test result. M. tuberculosis complex  infection unlikely.    NIL 0.03 IU/mL   Mitogen-NIL >10.00 IU/mL   TB1-NIL 0.00 IU/mL   TB2-NIL <0.00 IU/mL    Comment: . The Nil tube value reflects the background interferon gamma immune response of the patient's blood sample. This value has been subtracted from the patient's displayed TB and Mitogen results. . Lower than expected results with the Mitogen tube prevent false-negative Quantiferon readings by detecting a patient with a potential immune suppressive condition and/or suboptimal pre-analytical specimen handling. . The TB1 Antigen tube is coated with the M. tuberculosis-specific antigens designed to elicit responses from TB antigen primed CD4+ helper T-lymphocytes. . The TB2 Antigen tube is coated with the M. tuberculosis-specific antigens designed to elicit responses from TB antigen primed CD4+ helper and CD8+ cytotoxic T-lymphocytes. . For additional information, please refer to https://education.[facility].com/faq/FAQ204 (This link is being provided for informational/ educational purposes only.) .   Serum protein electrophoresis with reflex     Status: None   Collection Time: 04/11/20 10:01 AM  Result Value Ref Range   Total Protein 7.0 6.1 - 8.1 g/dL   Albumin ELP 3.9 3.8 - 4.8 g/dL  Alpha 1 0.3 0.2 - 0.3 g/dL   Alpha 2 0.7 0.5  - 0.9 g/dL   Beta Globulin 0.5 0.4 - 0.6 g/dL   Beta 2 0.4 0.2 - 0.5 g/dL   Gamma Globulin 1.1 0.8 - 1.7 g/dL   Abnormal Protein Band1 NOTE NONE DETEC g/dL   SPE Interp.      Comment: . A poorly-defined band of restricted protein mobility is detected in the gamma globulins. It is unlikely that this may represent a monoclonal protein; however, immunofixation analysis is available if clinically indicated. .   IgG, IgA, IgM     Status: None   Collection Time: 04/11/20 10:01 AM  Result Value Ref Range   Immunoglobulin A 288 70 - 320 mg/dL   IgG (Immunoglobin G), Serum 1,206 600 - 1,540 mg/dL   IgM, Serum 99 50 - 300 mg/dL  Thiopurine methyltransferase(tpmt)rbc     Status: None   Collection Time: 04/11/20 10:01 AM  Result Value Ref Range   Thiopurine Methyltransferase, RBC 13 nmol/hr/mL RBC    Comment: . Reference Range for TPMT Activity: .    >12       Normal   4-12       Heterozygote or low metabolizer     <4       Homozygote Deficient Range . This test was developed and its analytical performance characteristics have been determined by [facility]. It has not been cleared or approved by FDA. This assay has been validated pursuant to the CLIA regulations and is used for clinical purposes.   Cryoglobulin     Status: None   Collection Time: 04/11/20 10:01 AM  Result Value Ref Range   Cryoglobulin, Qualitative Analysis None Detected None Detec  IFE Interpretation     Status: None   Collection Time: 04/11/20 10:01 AM  Result Value Ref Range   Immunofix Electr Int      Comment: . A faint monoclonal free lambda light chain is detected. . . If Bence-Jones proteinuria (free light chain) is a clinical concern, immunofixation analysis on 24 hour urine collection is suggested. Veto Kemps ab-titer (ANA titer)     Status: Abnormal   Collection Time: 04/11/20 10:01 AM  Result Value Ref Range   ANA Titer 1 1:160 (H) titer     Comment:                 Reference Range                 <1:40        Negative                 1:40-1:80    Low Antibody Level                 >1:80        Elevated Antibody Level .    ANA Pattern 1 Nuclear, Speckled (A)     Comment: Speckled pattern is associated with mixed connective tissue disease (MCTD), systemic lupus erythematosus (SLE), Sjogren's syndrome, dermatomyositis, and  systemic sclerosis/polymyositis overlap. . AC-2,4,5,29: Speckled . International Consensus on ANA Patterns (https://www.hernandez-brewer.com/)   Type and screen     Status: None   Collection Time: 05/01/20  3:44 PM  Result Value Ref Range   ABO/RH(D) B NEG    Antibody Screen NEG    Sample Expiration 05/15/2020,2359    Extend sample reason      NO TRANSFUSIONS OR PREGNANCY IN  THE PAST 3 MONTHS Performed at [facility], [place] [address] [place], [place] [address]   Surgical pcr screen     Status: None   Collection Time: 05/01/20  3:44 PM   Specimen: Nasal Mucosa; Nasal Swab  Result Value Ref Range   MRSA, PCR NEGATIVE NEGATIVE   Staphylococcus aureus NEGATIVE NEGATIVE    Comment: (NOTE) The Xpert SA Assay (FDA approved for NASAL specimens in patients 32 years of age and older), is one component of a comprehensive surveillance program. It is not intended to diagnose infection nor to guide or monitor treatment. Performed at [facility], [place] [address], [place], [place] [address]   SARS CORONAVIRUS 2 (TAT 6-24 HRS) Nasopharyngeal Nasopharyngeal Swab     Status: None   Collection Time: 05/01/20  3:44 PM   Specimen: Nasopharyngeal Swab  Result Value Ref Range   SARS Coronavirus 2 NEGATIVE NEGATIVE    Comment: (NOTE) SARS-CoV-2 target nucleic acids are NOT DETECTED.  The SARS-CoV-2 RNA is generally detectable in upper and lower respiratory specimens during the acute phase of infection. Negative results do not preclude SARS-CoV-2 infection, do not rule out co-infections with  other pathogens, and should not be used as the sole basis for treatment or other patient management decisions. Negative results must be combined with clinical observations, patient history, and epidemiological information. The expected result is Negative.  Fact Sheet for Patients: SugarRoll.be  Fact Sheet for Healthcare Providers: https://www.woods-mathews.com/  This test is not yet approved or cleared by the [place] FDA and  has been authorized for detection and/or diagnosis of SARS-CoV-2 by FDA under an Emergency Use Authorization (EUA). This EUA will remain  in effect (meaning this test can be used) for the duration of the COVID-19 declaration under Se ction 564(b)(1) of the Act, 21 U.S.C. section 360bbb-3(b)(1), unless the authorization is terminated or revoked sooner.  Performed at [facility], [place] [address] [place], [place] [address]   Basic metabolic panel per protocol     Status: Abnormal   Collection Time: 05/01/20  4:00 PM  Result Value Ref Range   Sodium 137 135 - 145 mmol/L   Potassium 4.0 3.5 - 5.1 mmol/L   Chloride 103 98 - 111 mmol/L   CO2 28 22 - 32 mmol/L   Glucose, Bld 87 70 - 99 mg/dL    Comment: Glucose reference range applies only to samples taken after fasting for at least 8 hours.   BUN 11 8 - 23 mg/dL   Creatinine, Ser 1.54 (H) 0.61 - 1.24 mg/dL   Calcium 9.8 8.9 - 10.3 mg/dL   GFR, Estimated 50 (L) >60 mL/min    Comment: (NOTE) Calculated using the CKD-EPI Creatinine Equation (2021)    Anion gap 6 5 - 15    Comment: Performed at [facility] [address], [place], [place] [address]  CBC per protocol     Status: Abnormal   Collection Time: 05/01/20  4:00 PM  Result Value Ref Range   WBC 11.4 (H) 4.0 - 10.5 K/uL   RBC 4.47 4.22 - 5.81 MIL/uL   Hemoglobin 14.2 13.0 - 17.0 g/dL   HCT 42.3 39.0 - 52.0 %   MCV 94.6 80.0 - 100.0 fL   MCH 31.8 26.0 - 34.0 pg   MCHC 33.6 30.0 - 36.0 g/dL    RDW 12.8 11.5 - 15.5 %   Platelets 261 150 - 400 K/uL   nRBC 0.0 0.0 - 0.2 %  Comment: Performed at [facility], [place] [address], [place], [place] [address]  C3 and C4     Status: None   Collection Time: 05/11/20 10:53 AM  Result Value Ref Range   C3 Complement 152 82 - 185 mg/dL   C4 Complement 52 15 - 53 mg/dL  Lupus Anticoagulant Eval w/Reflex     Status: Abnormal   Collection Time: 05/11/20 10:53 AM  Result Value Ref Range   Lupus Anticoagulant see note     Comment: A Lupus Anticoagulant is not detected. Marland Kitchen Reference Range:  Not Detected . For additional information, please refer to http://education.quest[facility].com/faq/FAQ01v2 . (This link is being provided for informational/ educational purposes only.) . Marland Kitchen This interpretation is based on the following test results. Marland Kitchen    PTT-LA Screen 55 (H) <=40 sec   dRVVT 56 (H) <=45 sec   PT, Mixing Interp see note (A)     Comment: The "corrected" mixing study pattern is most commonly associated with a Factor(s) deficiency - often acquired due to anticoagulant therapy. This suggests that the positive dRVVT Confirm is a false positive. Clinical correlation is required.   RNP Antibody     Status: None   Collection Time: 05/11/20 10:53 AM  Result Value Ref Range   Ribonucleic Protein(ENA) Antibody, IgG <1.0 NEG <1.0 NEG AI  Anti-DNAse B antibody     Status: None   Collection Time: 05/11/20 10:53 AM  Result Value Ref Range   Anti-DNAse-B 113 <301 U/mL  Beta-2 glycoprotein antibodies     Status: Abnormal   Collection Time: 05/11/20 10:53 AM  Result Value Ref Range   Beta-2 Glyco I IgG 19.9 U/mL    Comment: Value          Interpretation -----          -------------- < 20.0         Antibody not detected > or = 20.0    Antibody detected .    Beta-2 Glyco 1 IgM 14.8 U/mL    Comment: Value          Interpretation -----          -------------- < 20.0         Antibody not detected > or = 20.0    Antibody  detected .    Beta-2 Glyco 1 IgA >65.0 (H) U/mL    Comment: Value          Interpretation -----          -------------- < 20.0         Antibody not detected > or = 20.0    Antibody detected . Marland Kitchen The antiphospholipid antibody syndrome (APS) is a  clinical-pathologic correlation that includes a  clinical event (e.g. arterial or venous thrombosis,  pregnancy morbidity) and persistent positive antiphospholipid antibodies (IgM or IgG  ACA >40 MPL/GPL-U/mL, IgM or IgG anti-b2GPI  antibodies or a lupus anticoagulant). International  consensus guidelines for APS suggest waiting at  least 12 weeks before retesting to confirm antibody  persistence.  The [facility] immunological classification  criteria for systemic lupus erythematosus (SLE)  include testing for isotype IgA, which has yet to be incorporated into APS criteria. Low level  antiphospholipid antibodies may sometimes be  detected in the setting of infection, drug therapy  or aging. . For additional information, please refer to htt p://education.questdiagn[facility].com/faq/FAQ109 (This link is being provided for informational/ educational purposes only.) . The antiphospholipid antibody syndrome (APS) is a  clinical-pathologic correlation that includes a  clinical event (e.g. arterial or venous thrombosis,  pregnancy morbidity) and persistent positive antiphospholipid antibodies (IgM or IgG  ACA >40 MPL/GPL-U/mL, IgM or IgG anti-b2GPI  antibodies or a lupus anticoagulant). International  consensus guidelines for APS suggest waiting at  least 12 weeks before retesting to confirm antibody  persistence.  The [facility] immunological classification  criteria for systemic lupus erythematosus (SLE)  include testing for isotype IgA, which has yet to be incorporated into APS criteria. Low level  antiphospholipid antibodies may sometimes be  detected in  the setting of infection, drug therapy  or aging. . For additional information, please refer to http://educatio n.questdiagnostics.com/faq/FAQ109 (This link is being provided for informational/ educational purposes only.) . The antiphospholipid antibody syndrome (APS) is a  clinical-pathologic correlation that includes a  clinical event (e.g. arterial or venous thrombosis,  pregnancy morbidity) and persistent positive antiphospholipid antibodies (IgM or IgG  ACA >40 MPL/GPL-U/mL, IgM or IgG anti-b2GPI  antibodies or a lupus anticoagulant). International  consensus guidelines for APS suggest waiting at  least 12 weeks before retesting to confirm antibody  persistence.  The [facility] immunological classification  criteria for systemic lupus erythematosus (SLE)  include testing for isotype IgA, which has yet to be incorporated into APS criteria. Low level  antiphospholipid antibodies may sometimes be  detected in the setting of infection, drug therapy  or aging. . For additional information, please refer to http://education.[facility].com/faq/FAQ109 (This link is being provided for informational/ educational purposes only.)   Cardiolipin antibodies, IgG, IgM, IgA     Status: Abnormal   Collection Time: 05/11/20 10:53 AM  Result Value Ref Range   Anticardiolipin IgA 60.2 (H) APL-U/mL    Comment: Value          Interpretation -----          -------------- < 20.0         Antibody not detected > or = 20.0    Antibody detected .    Anticardiolipin IgG 17.5 GPL-U/mL    Comment: Value          Interpretation -----          -------------- < 20.0         Antibody not detected > or = 20.0    Antibody detected .    Anticardiolipin IgM 7.7 MPL-U/mL    Comment: Value          Interpretation -----          -------------- < 20.0         Antibody not detected > or = 20.0    Antibody detected . Marland Kitchen The antiphospholipid antibody  syndrome (APS) is a  clinical-pathologic correlation that includes a  clinical event (e.g. arterial or venous thrombosis,  pregnancy morbidity) and persistent positive antiphospholipid antibodies (IgM or IgG  ACA >40 MPL/GPL-U/mL, IgM or IgG anti-b2GPI  antibodies or a lupus anticoagulant). International  consensus guidelines for APS suggest waiting at  least 12 weeks before retesting to confirm antibody  persistence.  The [facility] immunological classification  criteria for systemic lupus erythematosus (SLE)  include testing for isotype IgA, which has yet to be incorporated into APS criteria. Low level  antiphospholipid antibodies may sometimes be  detected in the setting of infection, drug therapy  or aging. . For additional information, please refer to htt p://education.questdiagnostics.com/faq/FAQ109 (This link is being provided for informational/ educational purposes only.)  Anti-Smith antibody     Status: None   Collection Time: 05/11/20 10:53 AM  Result Value Ref Range   ENA SM Ab Ser-aCnc <1.0 NEG <1.0 NEG AI  Anti-scleroderma antibody     Status: None   Collection Time: 05/11/20 10:53 AM  Result Value Ref Range   Scleroderma (Scl-70) (ENA) Antibody, IgG <1.0 NEG <1.0 NEG AI  Sjogrens syndrome-B extractable nuclear antibody     Status: None   Collection Time: 05/11/20 10:53 AM  Result Value Ref Range   SSB (La) (ENA) Antibody, IgG <1.0 NEG <1.0 NEG AI  Sjogrens syndrome-A extractable nuclear antibody     Status: None   Collection Time: 05/11/20 10:53 AM  Result Value Ref Range   SSA (Ro) (ENA) Antibody, IgG <1.0 NEG <1.0 NEG AI  Anti-DNA antibody, double-stranded     Status: None   Collection Time: 05/11/20 10:53 AM  Result Value Ref Range   ds DNA Ab <1 IU/mL    Comment:                            IU/mL       Interpretation                            < or = 4    Negative                            5-9          Indeterminate                            > or = 10   Positive .   TEST AUTHORIZATION     Status: None   Collection Time: 05/11/20 10:53 AM  Result Value Ref Range   TEST NAME: DNA (DS) ANTIBODY    TEST CODE: [id number]    CLIENT CONTACT: DONNA CONNER    REPORT ALWAYS MESSAGE SIGNATURE      Comment: . The laboratory testing on this patient was verbally requested or confirmed by the ordering physician or his or her authorized representative after contact with an employee of [employer]. Federal regulations require that we maintain on file written authorization for all laboratory testing.  Accordingly we are asking that the ordering physician or his or her authorized representative sign a copy of this report and promptly return it to the client service representative. . . Signature:____________________________________________________ . Please fax this signed page to [phone] or return it via your [employer] courier.   rflx hexagonal Phase Confirm     Status: None   Collection Time: 05/11/20 10:53 AM  Result Value Ref Range   Hexagonal Phase Conf Negative Negative  RFLX DRVVT CONFRIM     Status: Abnormal   Collection Time: 05/11/20 10:53 AM  Result Value Ref Range   DRVVT CONFIRM Positive (A) Negative  rfx DRVVT 1:1 Mix     Status: None   Collection Time: 05/11/20 10:53 AM  Result Value Ref Range   dRVVT 1:1 Mix CORRECTED CORRECTED  Protein electrophoresis, serum     Status: None   Collection Time: 05/30/20  3:39 PM  Result Value Ref Range   Total Protein ELP 7.0 6.0 - 8.5 g/dL   Albumin ELP 3.4 2.9 - 4.4 g/dL   Alpha-1-Globulin 0.3 0.0 - 0.4 g/dL  Alpha-2-Globulin 0.9 0.4 - 1.0 g/dL   Beta Globulin 1.2 0.7 - 1.3 g/dL   Gamma Globulin 1.2 0.4 - 1.8 g/dL   M-Spike, % Not Observed Not Observed g/dL   SPE Interp. Comment     Comment: (NOTE) The SPE pattern appears unremarkable. Evidence of monoclonal protein is not apparent. Performed At: [facility] [place], [place] [id number] Rush Farmer MD [id number]    Comment Comment     Comment: (NOTE) Protein electrophoresis scan will follow via computer, mail, or courier delivery.    Globulin, Total 3.6 2.2 - 3.9 g/dL   A/G Ratio 0.9 0.7 - 1.7  Immunofixation electrophoresis     Status: None   Collection Time: 05/30/20  3:39 PM  Result Value Ref Range   Total Protein ELP 7.0 6.0 - 8.5 g/dL   IgG (Immunoglobin G), Serum 1,131 603 - 1,613 mg/dL   IgA 303 61 - 437 mg/dL   IgM (Immunoglobulin M), Srm 93 20 - 172 mg/dL    Comment: (NOTE) Performed At: [facility] [place], [place] [id number] Rush Farmer MD VZ:[id number]    Immunofixation Result, Serum Comment     Comment: (NOTE) The immunofixation pattern appears unremarkable. Evidence of monoclonal protein is not apparent.   CBC with Differential/Platelet     Status: Abnormal   Collection Time: 05/30/20  3:39 PM  Result Value Ref Range   WBC 11.4 (H) 4.0 - 10.5 K/uL   RBC 4.29 4.22 - 5.81 MIL/uL   Hemoglobin 13.7 13.0 - 17.0 g/dL   HCT 41.0 39.0 - 52.0 %   MCV 95.6 80.0 - 100.0 fL   MCH 31.9 26.0 - 34.0 pg   MCHC 33.4 30.0 - 36.0 g/dL   RDW 12.8 11.5 - 15.5 %   Platelets 291 150 - 400 K/uL   nRBC 0.0 0.0 - 0.2 %   Neutrophils Relative % 63 %   Neutro Abs 7.1 1.7 - 7.7 K/uL   Lymphocytes Relative 27 %   Lymphs Abs 3.1 0.7 - 4.0 K/uL   Monocytes Relative 6 %   Monocytes Absolute 0.7 0.1 - 1.0 K/uL   Eosinophils Relative 3 %   Eosinophils Absolute 0.4 0.0 - 0.5 K/uL   Basophils Relative 1 %   Basophils Absolute 0.1 0.0 - 0.1 K/uL   Immature Granulocytes 0 %   Abs Immature Granulocytes 0.04 0.00 - 0.07 K/uL    Comment: Performed at [facility], [address], [place], [place] [address]  Comprehensive metabolic panel     Status: Abnormal   Collection Time: 05/30/20  3:39 PM  Result Value Ref Range   Sodium 136 135 - 145 mmol/L   Potassium 3.7 3.5 - 5.1 mmol/L    Chloride 103 98 - 111 mmol/L   CO2 24 22 - 32 mmol/L   Glucose, Bld 91 70 - 99 mg/dL    Comment: Glucose reference range applies only to samples taken after fasting for at least 8 hours.   BUN 13 8 - 23 mg/dL   Creatinine, Ser 1.47 (H) 0.61 - 1.24 mg/dL   Calcium 9.3 8.9 - 10.3 mg/dL   Total Protein 7.5 6.5 - 8.1 g/dL   Albumin 3.9 3.5 - 5.0 g/dL   AST 18 15 - 41 U/L   ALT 19 0 - 44 U/L   Alkaline Phosphatase 150 (H) 38 - 126 U/L   Total Bilirubin 0.6 0.3 - 1.2 mg/dL   GFR, Estimated 53 (L) >60  mL/min    Comment: (NOTE) Calculated using the CKD-EPI Creatinine Equation (2021)    Anion gap 9 5 - 15    Comment: Performed at [facility], [address] [place], [place] [address]  Lactate dehydrogenase     Status: None   Collection Time: 05/30/20  3:39 PM  Result Value Ref Range   LDH 142 98 - 192 U/L    Comment: Performed at [facility], [address], [place], [place] [address]  Beta 2 microglobulin, serum     Status: Abnormal   Collection Time: 05/30/20  3:39 PM  Result Value Ref Range   Beta-2 Microglobulin 2.9 (H) 0.6 - 2.4 mg/L    Comment: (NOTE) Siemens Immulite 2000 Immunochemiluminometric assay (ICMA) Values obtained with different assay methods or kits cannot be used interchangeably. Results cannot be interpreted as absolute evidence of the presence or absence of malignant disease. Performed At: [facility] [place], [place] [id number] Rush Farmer MD AT:[id number]   UPEP/UIFE/Light Chains/TP, 24-Hr Ur     Status: Abnormal   Collection Time: 06/06/20  2:53 PM  Result Value Ref Range   Total Protein, Urine 12.5 Not Estab. mg/dL   Total Protein, Urine-Ur/day 328 (H) 30 - 150 mg/24 hr   Albumin, U 100.0 %   ALPHA 1 URINE 0.0 %   Alpha 2, Urine 0.0 %   % BETA, Urine 0.0 %   GAMMA GLOBULIN URINE 0.0 %   Free Kappa Lt Chains,Ur 19.84 1.17 - 86.46 mg/L   Free Lambda Lt Chains,Ur 7.02 0.27 - 15.21 mg/L   Free Kappa/Lambda Ratio 2.83 1.83 -  14.26    Comment: (NOTE) Performed At: [facility] [place], [place] [id number] Rush Farmer MD GB:[id number]    Immunofixation Result, Urine Comment     Comment: (NOTE) The immunofixation pattern appears unremarkable. Evidence of monoclonal protein is not apparent.    Total Volume 2,625     Comment: Performed at [facility], [address], [place], [place] [address]   M-SPIKE %, Urine Not Observed Not Observed %   Note: Comment     Comment: (NOTE) Protein electrophoresis scan will follow via computer, mail, or courier delivery.     RADIOGRAPHIC STUDIES: I have personally reviewed the radiological images as listed and agreed with the findings in the report. DG Bone Survey Met  Result Date: 05/31/2020 CLINICAL DATA:  MGUS EXAM: METASTATIC BONE SURVEY COMPARISON:  None. FINDINGS: No evidence of lytic lesion in the axial or appendicular skeleton. There is a sclerotic lesion in the right distal medial tibial diaphysis with narrow zone of transition, typical of benign cortical defect. Occasional additional sclerotic densities in both proximal tibia likely bone islands. No suspicious bone lesion. Surgical fixation of remote right distal fibular fracture. There are remote right rib fractures. Mild osteoarthritis of both knees. There is degenerative change involving the cervical and thoracic spine with endplate spurring and mild disc space narrowing. Postsurgical change at L5-S1. No vertebral body compression fracture. Aortic atherosclerosis. IMPRESSION: 1. No evidence of lytic lesion in the axial or appendicular skeleton. 2. Sclerotic lesion in the right distal tibial diaphysis is typical of benign cortical defect, of no clinical significance. Electronically Signed   By: Keith Rake M.D.   On: 05/31/2020 17:37    ASSESSMENT: 1.  Positive for IgA anticardiolipin antibody and IgA antibeta-2 glycoprotein 1 antibody - IgA anticardiolipin antibody (60.2) and IgA  antibeta-2 glycoprotein 1 antibody (> 65.0) - Patient denies any previous  history of clotting with DVT or PE.  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  2.  Monoclonal gammopathy of undetermined significance - SPEP with a poorly-defined band of restricted protein mobility in the gammaglobulin region, that may represent a monoclonal protein.  IFE detected faint monoclonal free lambda light chain. - Creatinine 1.54, hemoglobin 14.2, and calcium 9.8. - Patient reports history of a "erosion in his tibia" in 2008, which was reportedly stable when rechecked next year - Patient has chronic pain, no new bony pains - Patient has chronic neuropathy and ataxia, but reports that both of these are getting worse; intermittent blurry vision and questionable history of TIAs x3  3.  Leukocytosis - Review of patient's lab results also show leukocytosis (WBC 11.4, ANC 7.2, eosinophils 0.6),  - Likely reactive related to rheumatologic process (ANA positive, undergoing rheumatology work-up for possible vasculitis) - May also be reactive related to vaping - No B symptoms apart from fatigue   PLAN:  1.  Positive for IgA anticardiolipin antibody and IgA antibeta-2 glycoprotein 1 antibody -We will repeat anticardiolipin antibodies and beta-2 glycoprotein 1 antibodies in 12-weeks  2.  Monoclonal gammopathy of undetermined significance -Work-up for plasma cell disorder (MGUS versus multiple myeloma) with 24-hour urine with UPEP/IFE, SPEP, serum IFE, light chains, CBC, CMP, LDH, beta-2 microglobulin -Obtain skeletal survey -Consider bone marrow biopsy, depending on the above results -Return to clinic in 2 weeks to discuss results and next steps  3.  Leukocytosis - Likely reactive related to rheumatologic process - We will check LDH today and repeat CBC in 12 weeks - Consider further testing if leukocytosis persists without  other cause   PLAN SUMMARY & DISPOSITION: -Labs today (myeloma panel) - Follow-up in 2 to 3 weeks - We will order further labs after 2-week follow-up visit (repeat CBC, anticardiolipin antibodies, beta-2 glycoprotein 1 antibodies in 12 weeks with follow-up again in 3 months)   All questions were answered. The patient knows to call the clinic with any problems, questions or concerns.   Medical decision making: Moderate (multiple new problems under work-up with undetermined prognosis, review of external notes, review of external tests, ordering new tests)  Time spent on visit: I spent 30 minutes counseling the patient face to face. The total time spent in the appointment was 45 minutes and more than 50% was on counseling.  I, Tarri Abernethy PA-C, have seen this patient in conjunction with Dr. Derek Jack. Greater than 50% of visit was performed by Dr. Delton Coombes.  Addendum: I have independently evaluated this patient and agree with HPI written by Tarri Abernethy, PA-C.  This patient is found to have positive IgA anticardiolipin antibodies and IgA antibeta-2 glycoprotein antibodies for vasculitic work-up.  He was also found to have abnormal SPEP.  Labs also show that elevated white count, likely reactive.  Will do full work-up for monoclonal gammopathy at this time.  We will also repeat his CBC and LDH.  We will plan to repeat lupus anticoagulant panel in 3 months to see if it is true positive.  RTC 2 weeks to discuss results.    Derek Jack, MD 06/08/20 4:50 PM

## 2020-05-30 NOTE — Patient Instructions (Signed)
[place] at [facility] Discharge Instructions  You were seen today by Dr. Delton Coombes and Tarri Abernethy PA-C for your abnormal lab values.  We are running additional tests and would like to see you again in 2-3 weeks to go over results.    LABS: Labs today   OTHER TESTS: Skeletal survey (whole-body X-rays)  MEDICATIONS: No changes  FOLLOW-UP APPOINTMENT: Return in 2-3 weeks to discuss results and next steps   Thank you for choosing [place] at [facility] to provide your oncology and hematology care.  To afford each patient quality time with our provider, please arrive at least 15 minutes before your scheduled appointment time.   If you have a lab appointment with the [facility] please come in thru the Main Entrance and check in at the main information desk.  You need to re-schedule your appointment should you arrive 10 or more minutes late.  We strive to give you quality time with our providers, and arriving late affects you and other patients whose appointments are after yours.  Also, if you no show three or more times for appointments you may be dismissed from the clinic at the providers discretion.     Again, [facility]  Our hope is that these requests will decrease the amount of time that you wait before being seen by our physicians.       _____________________________________________________________  Should you have questions after your visit to [facility], please contact our office at [phone] and follow the prompts.  Our office hours are 8:00 a.m. and 4:30 p.m. Monday - Friday.  Please note that voicemails left after 4:00 p.m. may not be returned until the following business day.  We are closed weekends and major holidays.  You do have access to a nurse 24-7, just call the main number to the clinic [phone] and do not press any options, hold on the line and a nurse  will answer the phone.    For prescription refill requests, have your pharmacy contact our office and allow 72 hours.    Due to Covid, you will need to wear a mask upon entering the hospital. If you do not have a mask, a mask will be given to you at the Main Entrance upon arrival. For doctor visits, patients may have 1 support person age 65 or older with them. For treatment visits, patients can not have anyone with them due to social distancing guidelines and our immunocompromised population.

## 2020-05-31 ENCOUNTER — Other Ambulatory Visit (HOSPITAL_COMMUNITY): Payer: Self-pay | Admitting: Nephrology

## 2020-05-31 ENCOUNTER — Other Ambulatory Visit (HOSPITAL_COMMUNITY): Payer: Medicare HMO

## 2020-05-31 DIAGNOSIS — C44212 Basal cell carcinoma of skin of right ear and external auricular canal: Secondary | ICD-10-CM | POA: Diagnosis not present

## 2020-05-31 DIAGNOSIS — L853 Xerosis cutis: Secondary | ICD-10-CM | POA: Diagnosis not present

## 2020-05-31 DIAGNOSIS — L821 Other seborrheic keratosis: Secondary | ICD-10-CM | POA: Diagnosis not present

## 2020-05-31 DIAGNOSIS — L810 Postinflammatory hyperpigmentation: Secondary | ICD-10-CM | POA: Diagnosis not present

## 2020-05-31 DIAGNOSIS — L570 Actinic keratosis: Secondary | ICD-10-CM | POA: Diagnosis not present

## 2020-05-31 DIAGNOSIS — I129 Hypertensive chronic kidney disease with stage 1 through stage 4 chronic kidney disease, or unspecified chronic kidney disease: Secondary | ICD-10-CM

## 2020-05-31 DIAGNOSIS — D485 Neoplasm of uncertain behavior of skin: Secondary | ICD-10-CM | POA: Diagnosis not present

## 2020-05-31 DIAGNOSIS — N1831 Chronic kidney disease, stage 3a: Secondary | ICD-10-CM

## 2020-05-31 LAB — BETA 2 MICROGLOBULIN, SERUM: Beta-2 Microglobulin: 2.9 mg/L — ABNORMAL HIGH (ref 0.6–2.4)

## 2020-06-01 LAB — IMMUNOFIXATION ELECTROPHORESIS
IgA: 303 mg/dL (ref 61–437)
IgG (Immunoglobin G), Serum: 1131 mg/dL (ref 603–1613)
IgM (Immunoglobulin M), Srm: 93 mg/dL (ref 20–172)
Total Protein ELP: 7 g/dL (ref 6.0–8.5)

## 2020-06-01 LAB — PROTEIN ELECTROPHORESIS, SERUM
A/G Ratio: 0.9 (ref 0.7–1.7)
Albumin ELP: 3.4 g/dL (ref 2.9–4.4)
Alpha-1-Globulin: 0.3 g/dL (ref 0.0–0.4)
Alpha-2-Globulin: 0.9 g/dL (ref 0.4–1.0)
Beta Globulin: 1.2 g/dL (ref 0.7–1.3)
Gamma Globulin: 1.2 g/dL (ref 0.4–1.8)
Globulin, Total: 3.6 g/dL (ref 2.2–3.9)
Total Protein ELP: 7 g/dL (ref 6.0–8.5)

## 2020-06-06 ENCOUNTER — Other Ambulatory Visit (HOSPITAL_COMMUNITY): Payer: Self-pay | Admitting: *Deleted

## 2020-06-06 ENCOUNTER — Other Ambulatory Visit: Payer: Self-pay | Admitting: *Deleted

## 2020-06-06 DIAGNOSIS — R21 Rash and other nonspecific skin eruption: Secondary | ICD-10-CM | POA: Diagnosis not present

## 2020-06-06 DIAGNOSIS — D6861 Antiphospholipid syndrome: Secondary | ICD-10-CM | POA: Diagnosis not present

## 2020-06-06 DIAGNOSIS — G629 Polyneuropathy, unspecified: Secondary | ICD-10-CM | POA: Diagnosis not present

## 2020-06-06 DIAGNOSIS — M549 Dorsalgia, unspecified: Secondary | ICD-10-CM | POA: Diagnosis not present

## 2020-06-06 DIAGNOSIS — G894 Chronic pain syndrome: Secondary | ICD-10-CM | POA: Diagnosis not present

## 2020-06-06 DIAGNOSIS — R7989 Other specified abnormal findings of blood chemistry: Secondary | ICD-10-CM | POA: Diagnosis not present

## 2020-06-06 DIAGNOSIS — D472 Monoclonal gammopathy: Secondary | ICD-10-CM

## 2020-06-06 DIAGNOSIS — R42 Dizziness and giddiness: Secondary | ICD-10-CM | POA: Diagnosis not present

## 2020-06-06 DIAGNOSIS — M2550 Pain in unspecified joint: Secondary | ICD-10-CM | POA: Diagnosis not present

## 2020-06-06 DIAGNOSIS — D72829 Elevated white blood cell count, unspecified: Secondary | ICD-10-CM | POA: Diagnosis not present

## 2020-06-07 ENCOUNTER — Ambulatory Visit (HOSPITAL_COMMUNITY)
Admission: RE | Admit: 2020-06-07 | Discharge: 2020-06-07 | Disposition: A | Discharge status: Home / Self Care | Payer: Medicare HMO | Source: Ambulatory Visit | Attending: Nephrology | Admitting: Nephrology

## 2020-06-07 ENCOUNTER — Other Ambulatory Visit: Payer: Self-pay

## 2020-06-07 ENCOUNTER — Encounter: Payer: Self-pay | Admitting: *Deleted

## 2020-06-07 DIAGNOSIS — I129 Hypertensive chronic kidney disease with stage 1 through stage 4 chronic kidney disease, or unspecified chronic kidney disease: Secondary | ICD-10-CM | POA: Insufficient documentation

## 2020-06-07 DIAGNOSIS — N183 Chronic kidney disease, stage 3 unspecified: Secondary | ICD-10-CM | POA: Diagnosis not present

## 2020-06-07 DIAGNOSIS — C4491 Basal cell carcinoma of skin, unspecified: Secondary | ICD-10-CM

## 2020-06-07 DIAGNOSIS — N1831 Chronic kidney disease, stage 3a: Secondary | ICD-10-CM | POA: Diagnosis not present

## 2020-06-07 HISTORY — DX: Basal cell carcinoma of skin, unspecified: C44.91

## 2020-06-08 LAB — UPEP/UIFE/LIGHT CHAINS/TP, 24-HR UR
% BETA, Urine: 0 %
ALPHA 1 URINE: 0 %
Albumin, U: 100 %
Alpha 2, Urine: 0 %
Free Kappa Lt Chains,Ur: 19.84 mg/L (ref 1.17–86.46)
Free Kappa/Lambda Ratio: 2.83 (ref 1.83–14.26)
Free Lambda Lt Chains,Ur: 7.02 mg/L (ref 0.27–15.21)
GAMMA GLOBULIN URINE: 0 %
Total Protein, Urine-Ur/day: 328 mg/24 hr — ABNORMAL HIGH (ref 30–150)
Total Protein, Urine: 12.5 mg/dL
Total Volume: 2625

## 2020-06-19 ENCOUNTER — Other Ambulatory Visit: Payer: Self-pay

## 2020-06-19 ENCOUNTER — Inpatient Hospital Stay (HOSPITAL_COMMUNITY): Payer: Medicare HMO | Attending: Hematology | Admitting: Physician Assistant

## 2020-06-19 VITALS — BP 139/88 | HR 103 | Temp 97.6°F | Resp 20 | Wt 225.3 lb

## 2020-06-19 DIAGNOSIS — Z8261 Family history of arthritis: Secondary | ICD-10-CM | POA: Insufficient documentation

## 2020-06-19 DIAGNOSIS — R5383 Other fatigue: Secondary | ICD-10-CM | POA: Insufficient documentation

## 2020-06-19 DIAGNOSIS — Z888 Allergy status to other drugs, medicaments and biological substances status: Secondary | ICD-10-CM | POA: Insufficient documentation

## 2020-06-19 DIAGNOSIS — H538 Other visual disturbances: Secondary | ICD-10-CM | POA: Diagnosis not present

## 2020-06-19 DIAGNOSIS — R7402 Elevation of levels of lactic acid dehydrogenase (LDH): Secondary | ICD-10-CM | POA: Insufficient documentation

## 2020-06-19 DIAGNOSIS — Z8249 Family history of ischemic heart disease and other diseases of the circulatory system: Secondary | ICD-10-CM | POA: Insufficient documentation

## 2020-06-19 DIAGNOSIS — Z880 Allergy status to penicillin: Secondary | ICD-10-CM | POA: Diagnosis not present

## 2020-06-19 DIAGNOSIS — R55 Syncope and collapse: Secondary | ICD-10-CM | POA: Insufficient documentation

## 2020-06-19 DIAGNOSIS — G479 Sleep disorder, unspecified: Secondary | ICD-10-CM | POA: Diagnosis not present

## 2020-06-19 DIAGNOSIS — Z79899 Other long term (current) drug therapy: Secondary | ICD-10-CM | POA: Insufficient documentation

## 2020-06-19 DIAGNOSIS — D72829 Elevated white blood cell count, unspecified: Secondary | ICD-10-CM | POA: Insufficient documentation

## 2020-06-19 DIAGNOSIS — R7989 Other specified abnormal findings of blood chemistry: Secondary | ICD-10-CM | POA: Insufficient documentation

## 2020-06-19 DIAGNOSIS — M549 Dorsalgia, unspecified: Secondary | ICD-10-CM | POA: Diagnosis not present

## 2020-06-19 DIAGNOSIS — R899 Unspecified abnormal finding in specimens from other organs, systems and tissues: Secondary | ICD-10-CM

## 2020-06-19 DIAGNOSIS — Z87891 Personal history of nicotine dependence: Secondary | ICD-10-CM | POA: Insufficient documentation

## 2020-06-19 DIAGNOSIS — D472 Monoclonal gammopathy: Secondary | ICD-10-CM | POA: Insufficient documentation

## 2020-06-19 DIAGNOSIS — N183 Chronic kidney disease, stage 3 unspecified: Secondary | ICD-10-CM | POA: Insufficient documentation

## 2020-06-19 DIAGNOSIS — G629 Polyneuropathy, unspecified: Secondary | ICD-10-CM | POA: Diagnosis not present

## 2020-06-19 DIAGNOSIS — Z836 Family history of other diseases of the respiratory system: Secondary | ICD-10-CM | POA: Diagnosis not present

## 2020-06-19 DIAGNOSIS — R270 Ataxia, unspecified: Secondary | ICD-10-CM | POA: Insufficient documentation

## 2020-06-19 DIAGNOSIS — G8929 Other chronic pain: Secondary | ICD-10-CM | POA: Diagnosis not present

## 2020-06-19 NOTE — Patient Instructions (Addendum)
[place] at [facility] Discharge Instructions  You were seen today by Tarri Abernethy PA-C for your abnormal labs.  We rechecked the amount of abnormal protein in your blood, and it was normal at this time.  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  We will recheck labs in 3 months and discuss further at your next follow up appointment.    LABS: Return in 12 weeks for labs   FOLLOW-UP APPOINTMENT: Return in about 3 months (2 weeks after labs)   Thank you for choosing [place] at [facility] to provide your oncology and hematology care.  To afford each patient quality time with our provider, please arrive at least 15 minutes before your scheduled appointment time.   If you have a lab appointment with the [facility] please come in thru the Main Entrance and check in at the main information desk.  You need to re-schedule your appointment should you arrive 10 or more minutes late.  We strive to give you quality time with our providers, and arriving late affects you and other patients whose appointments are after yours.  Also, if you no show three or more times for appointments you may be dismissed from the clinic at the providers discretion.     Again, thank you for choosing [facility].  Our hope is that these requests will decrease the amount of time that you wait before being seen by our physicians.       _____________________________________________________________  Should you have questions after your visit to [facility], please contact our office at [phone] and follow the prompts.  Our office hours are 8:00 a.m. and 4:30 p.m. Monday - Friday.  Please note that voicemails left after 4:00 p.m. may not be returned until the following business day.  We are closed weekends and major holidays.  You do have access to a nurse 24-7, just call the main number to the  clinic [phone] and do not press any options, hold on the line and a nurse will answer the phone.    For prescription refill requests, have your pharmacy contact our office and allow 72 hours.    Due to Covid, you will need to wear a mask upon entering the hospital. If you do not have a mask, a mask will be given to you at the Main Entrance upon arrival. For doctor visits, patients may have 1 support person age 40 or older with them. For treatment visits, patients can not have anyone with them due to social distancing guidelines and our immunocompromised population.

## 2020-06-19 NOTE — Progress Notes (Signed)
Juan Yang, Juan Yang [id number]   CLINIC:  Medical Oncology/Hematology  PCP:  Juan Yang, [place] [address] [phone]   REASON FOR VISIT:  Follow-up for MGUS and abnormal coagulopathy labs  CURRENT THERAPY: Under work-up  INTERVAL HISTORY:  Juan Yang 65 y.o. male returns for routine follow-up of his MGUS and other lab abnormalities.  He was seen for initial consultation by Dr. Delton Coombes and Tarri Abernethy PA-C on 05/30/2020. He returns today to discuss results of the work-up initiate at that visit.   Since his last visit, he reports that he has been doing well.  He reports that he was evaluated by nephrology and diagnosed with stage III chronic kidney disease.    He continues ot recover from back surgery (6 weeks ago), but pain is slightly decreased.  His chronic neuropathy and fatigue are at baseline.  No fever, chills, shortness of breath, cough, chest pain, nausea, vomiting, abdominal pain.  Denies any signs or symptoms of blood loss.  No current signs or symptoms of blood clots.  He has 25% energy and 50% appetite. He endorses that he is maintaining a stable weight.    REVIEW OF SYSTEMS:  Review of Systems  Constitutional: Positive for fatigue. Negative for appetite change, chills, diaphoresis, fever and unexpected weight change.  HENT:   Negative for lump/mass and nosebleeds.   Eyes: Negative for eye problems.  Respiratory: Negative for cough, hemoptysis and shortness of breath.   Cardiovascular: Negative for chest pain, leg swelling and palpitations.  Gastrointestinal: Negative for abdominal pain, blood in stool, constipation, diarrhea, nausea and vomiting.  Genitourinary: Negative for hematuria.   Musculoskeletal: Positive for back pain (4/10).  Skin: Negative.   Neurological: Positive for dizziness and numbness (Chronic neuropathy of legs and feet bilaterally). Negative for headaches and light-headedness.   Hematological: Does not bruise/bleed easily.  Psychiatric/Behavioral: Positive for sleep disturbance.      PAST MEDICAL/SURGICAL HISTORY:  Past Medical History:  Diagnosis Date  . AAA (abdominal aortic aneurysm) (HCC)    3.2 cm 02/01/18 CT, recommended [place] in 3 years  . Anxiety   . Arthritis   . 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 Charcot-Marie-Tooth disease type 2   . CKD (chronic kidney disease)   . Dyspnea   . Guaiac + stool 10/29/2016  . History of kidney stones   . Hypertension   . Peripheral neuropathy 10/29/2016   Past Surgical History:  Procedure Laterality Date  . COLONOSCOPY WITH PROPOFOL N/A 11/14/2016   Procedure: COLONOSCOPY WITH PROPOFOL;  Surgeon: Rogene Houston, MD;  [facility];  Service: Endoscopy;  Laterality: N/A;  9:15  . LUMBAR LAMINECTOMY/DECOMPRESSION MICRODISCECTOMY Right 05/24/2019   Procedure: LAMINECTOMY AND FORAMINOTOMY LUMBAR FIVE- SACRAL ONE RIGHT;  Surgeon: Vallarie Mare, MD;  Location: [facility];  Service: Neurosurgery;  Laterality: Right;  posterior/right  . POLYPECTOMY  11/14/2016   Procedure: POLYPECTOMY- SIGMOID COLON X3 TRANSVERSE COLON X2;  Surgeon: Rogene Houston, MD;  [facility];  Service: Endoscopy;;  . rt ankle surgery     Titanium hinge  . TONSILLECTOMY    . TRANSFORAMINAL LUMBAR INTERBODY FUSION (TLIF) WITH PEDICLE SCREW FIXATION 1 LEVEL N/A 05/02/2020   Procedure: Lumbar five Sacral one Transforaminal lumbar interbody fusion;  Surgeon: Vallarie Mare, MD;  Location: [facility];  Service: Neurosurgery;  Laterality: N/A;     SOCIAL HISTORY:  Social History   Socioeconomic History  .  Marital status: Married    Spouse name: Not on file  . Number of children: Not on file  . Years of education: Not on file  . Highest education level: Not on file  Occupational History  . Not on file  Tobacco Use  . Smoking status: Former Smoker    Packs/day: 1.50    Years: 20.00    Pack years: 30.00     Types: Cigarettes    Quit date: 11/12/2012    Years since quitting: 7.6  . Smokeless tobacco: Never Used  Vaping Use  . Vaping Use: Some days  Substance and Sexual Activity  . Alcohol use: No  . Drug use: No  . Sexual activity: Yes    Birth control/protection: None  Other Topics Concern  . Not on file  Social History Narrative   Right handed   Lives with wife in one story home   Social Determinants of Health   Financial Resource Strain: Not on file  Food Insecurity: Not on file  Transportation Needs: No Transportation Needs  . Lack of Transportation (Medical): No  . Lack of Transportation (Non-Medical): No  Physical Activity: Inactive  . Days of Exercise per Week: 0 days  . Minutes of Exercise per Session: 0 min  Stress: Not on file  Social Connections: Not on file  Intimate Partner Violence: Not At Risk  . Fear of Current or Ex-Partner: No  . Emotionally Abused: No  . Physically Abused: No  . Sexually Abused: No    FAMILY HISTORY:  Family History  Problem Relation Age of Onset  . Charcot-Marie-Tooth disease Mother   . Heart disease Father   . COPD Father   . Arthritis Sister     CURRENT MEDICATIONS:  Outpatient Encounter Medications as of 06/19/2020  Medication Sig  . amLODipine (NORVASC) 2.5 MG tablet Take 2.5 mg by mouth 2 (two) times daily.  Marland Kitchen aspirin 325 MG tablet Take by mouth.  Marland Kitchen buPROPion (WELLBUTRIN XL) 300 MG 24 hr tablet Take 300 mg by mouth daily.  . Ca Carbonate-Mag Hydroxide (ROLAIDS PO) Take 2-3 tablets by mouth daily as needed (heartburn).  . clobetasol cream (TEMOVATE) 1.74 % Apply 1 application topically 2 (two) times daily as needed (rash).  . cyclobenzaprine (FLEXERIL) 10 MG tablet Take 1 tablet (10 mg total) by mouth 3 (three) times daily as needed for muscle spasms.  Marland Kitchen docusate sodium (COLACE) 100 MG capsule Take 1 capsule (100 mg total) by mouth 2 (two) times daily.  Marland Kitchen gabapentin (NEURONTIN) 300 MG capsule Take 300 mg by mouth 3 (three)  times daily. Additional 300 mg if needed for pain  . Glucosamine HCl-MSM (GLUCOSAMINE-MSM PO) Take 1 tablet by mouth daily.  . metoprolol succinate (TOPROL-XL) 25 MG 24 hr tablet Take 12.5 mg by mouth daily.  . nortriptyline (PAMELOR) 25 MG capsule Take 25 mg by mouth at bedtime.  . Omega-3 1000 MG CAPS Take 1,000 mg by mouth daily.  Marland Kitchen oxyCODONE-acetaminophen (PERCOCET/ROXICET) 5-325 MG tablet Take 1-2 tablets by mouth every 6 (six) hours as needed for moderate pain.  Marland Kitchen Potassium 99 MG TABS Take 99 mg by mouth daily.  . sildenafil (VIAGRA) 100 MG tablet Take 100 mg by mouth daily as needed for erectile dysfunction.  Marland Kitchen tiZANidine (ZANAFLEX) 4 MG tablet Take by mouth.   No facility-administered encounter medications on file as of 06/19/2020.    ALLERGIES:  Allergies  Allergen Reactions  . Other Hives    DUCK EGGS ONLY  . Atenolol Diarrhea  and Nausea Only  . Cymbalta [Duloxetine Hcl] Other (See Comments)    Pt states it shut his bladder down and could not urinate; states it made him lethargic as well.  . Latex Itching  . Penicillins Hives    Did it involve swelling of the face/tongue/throat, SOB, or low BP? Unknown Did it involve sudden or severe rash/hives, skin peeling, or any reaction on the inside of your mouth or nose? Unknown Did you need to seek medical attention at a hospital or doctor's office? yes When did it last happen?Childhood If all above answers are "NO", may proceed with cephalosporin use.     PHYSICAL EXAM:  ECOG PERFORMANCE STATUS: 1 - Symptomatic but completely ambulatory  Vitals:   06/19/20 1016  BP: 139/88  Pulse: (!) 103  Resp: 20  Temp: 97.6 F (36.4 C)  SpO2: 97%   Filed Weights   06/19/20 1016  Weight: 225 lb 5 oz (102.2 kg)   Physical Exam Constitutional:      Appearance: Normal appearance. He is obese.  HENT:     Head: Normocephalic and atraumatic.     Mouth/Throat:     Mouth: Mucous membranes are moist.  Eyes:     Extraocular  Movements: Extraocular movements intact.     Pupils: Pupils are equal, round, and reactive to light.  Cardiovascular:     Rate and Rhythm: Normal rate and regular rhythm.     Pulses: Normal pulses.     Heart sounds: Normal heart sounds.  Pulmonary:     Effort: Pulmonary effort is normal.     Breath sounds: Normal breath sounds.  Abdominal:     General: Bowel sounds are normal.     Palpations: Abdomen is soft.     Tenderness: There is no abdominal tenderness.  Musculoskeletal:        General: No swelling.     Right lower leg: No edema.     Left lower leg: No edema.  Lymphadenopathy:     Cervical: No cervical adenopathy.  Skin:    General: Skin is warm and dry.  Neurological:     General: No focal deficit present.     Mental Status: He is alert and oriented to person, place, and time.  Psychiatric:        Mood and Affect: Mood normal.        Behavior: Behavior normal.      LABORATORY DATA:  I have reviewed the labs as listed.  CBC    Component Value Date/Time   WBC 11.4 (H) 05/30/2020 1539   RBC 4.29 05/30/2020 1539   HGB 13.7 05/30/2020 1539   HGB 14.5 10/10/2019 1635   HCT 41.0 05/30/2020 1539   HCT 42.0 10/10/2019 1635   PLT 291 05/30/2020 1539   PLT 269 10/10/2019 1635   MCV 95.6 05/30/2020 1539   MCV 93 10/10/2019 1635   MCH 31.9 05/30/2020 1539   MCHC 33.4 05/30/2020 1539   RDW 12.8 05/30/2020 1539   RDW 12.8 10/10/2019 1635   LYMPHSABS 3.1 05/30/2020 1539   LYMPHSABS 2.2 10/10/2019 1635   MONOABS 0.7 05/30/2020 1539   EOSABS 0.4 05/30/2020 1539   EOSABS 0.8 (H) 10/10/2019 1635   BASOSABS 0.1 05/30/2020 1539   BASOSABS 0.1 10/10/2019 1635   CMP Latest Ref Rng & Units 05/30/2020 05/01/2020 04/11/2020  Glucose 70 - 99 mg/dL 91 87 -  BUN 8 - 23 mg/dL 13 11 -  Creatinine 0.61 - 1.24 mg/dL 1.47(H) 1.54(H) -  Sodium  135 - 145 mmol/L 136 137 -  Potassium 3.5 - 5.1 mmol/L 3.7 4.0 -  Chloride 98 - 111 mmol/L 103 103 -  CO2 22 - 32 mmol/L 24 28 -  Calcium 8.9 -  10.3 mg/dL 9.3 9.8 -  Total Protein 6.5 - 8.1 g/dL 7.5 - 7.0  Total Bilirubin 0.3 - 1.2 mg/dL 0.6 - -  Alkaline Phos 38 - 126 U/L 150(H) - -  AST 15 - 41 U/L 18 - -  ALT 0 - 44 U/L 19 - -    DIAGNOSTIC IMAGING:  I have independently reviewed the relevant imaging and discussed with the patient.  ASSESSMENT: 1.  Positive for IgA anticardiolipin antibody and IgA antibeta-2 glycoprotein 1 antibody -IgA anticardiolipin antibody (60.2)and IgA antibeta-2 glycoprotein 1 antibody(> 65.0) - Patient denies any previous history of clotting with DVT or PE. He does have a history of questionable TIAs x 3 -There is no family history of blood clots, although his mother did have 1 miscarriage.  2.  Monoclonal gammopathy of undetermined significance - Work-up by rheumatology showed faint monoclonal gammopathy: SPEP witha poorly-defined band of restricted protein mobility in the gammaglobulin region, that may represent a monoclonal protein. IFE detected faint monoclonal free lambda light chain. - Creatinine 1.54,hemoglobin14.2,andcalcium 9.8. - Patient reports history of a "erosion in his tibia" in 2008, which was reportedly stable when rechecked next year - Patient has chronic pain, no new bony pains - Patient has chronic neuropathy and ataxia, but reports that both of these are getting worse; intermittent blurry vision and questionable history of TIAs x3 - MGUS/myeloma panel completed on 05/30/20 by hematology clinic was unremarkable: SPEP showed no M spike, IFE negative for monoclonal protein, 24-hour urine with UPEP/IFE showed no M spike or monoclonal protein - Skeletal survey (05/30/2020) no lytic lesion, prior sclerotic lesion of right tibia once again noted - LDH normal, beta microglobulin mildly elevated 2.9  3.  Leukocytosis -Review of patient's lab results also show leukocytosis (WBC 11.4, ANC 7.2, eosinophils 0.6),  - Likely reactive related to rheumatologic process (ANA positive,  undergoing rheumatology work-up for possible vasculitis) - May also be reactive related to vaping - No B symptoms apart from fatigue   PLAN:  1.  Positive for IgA anticardiolipin antibody and IgA antibeta-2 glycoprotein 1 antibody -We will repeat anticardiolipin antibodies and beta-2 glycoprotein 1 antibodies in 12-weeks - RTC in 3 months to discuss results and next steps  2.  Monoclonal gammopathy of undetermined significance -Work-up by rheumatology showed faint monoclonal gammopathy - MGUS/myeloma panel completed on 05/30/20 by hematology clinic was unremarkable - Free light chain test was not obtained, will order with next set of labs -Patient may have low-grade or developing plasma cell dyscrasia - We will repeat MGUS/myeloma panel in 6 months   3.  Leukocytosis - Likely reactive related to rheumatologic process or vaping -  LDH normal, will repeat CBC in 12 weeks - Consider further testing if leukocytosis persists without other cause - RTC in 3 months to discuss results and next steps   PLAN SUMMARY & DISPOSITION: -Labs in 12 weeks (CBC, anticardiolipin antibodies, beta-2 glycoprotein 1 antibody, free light chains) -Follow-up visit in 3 months  All questions were answered. The patient knows to call the clinic with any problems, questions or concerns.  Medical decision making: Low  Time spent on visit: I spent 15 minutes counseling the patient face to face. The total time spent in the appointment was 20 minutes and more than 50% was  on counseling.   Harriett Rush, PA-C  06/19/20 10:48 AM

## 2020-06-28 DIAGNOSIS — R809 Proteinuria, unspecified: Secondary | ICD-10-CM | POA: Diagnosis not present

## 2020-06-28 DIAGNOSIS — E6609 Other obesity due to excess calories: Secondary | ICD-10-CM | POA: Diagnosis not present

## 2020-06-28 DIAGNOSIS — I129 Hypertensive chronic kidney disease with stage 1 through stage 4 chronic kidney disease, or unspecified chronic kidney disease: Secondary | ICD-10-CM | POA: Diagnosis not present

## 2020-06-28 DIAGNOSIS — N1831 Chronic kidney disease, stage 3a: Secondary | ICD-10-CM | POA: Diagnosis not present

## 2020-06-28 DIAGNOSIS — K760 Fatty (change of) liver, not elsewhere classified: Secondary | ICD-10-CM | POA: Diagnosis not present

## 2020-06-28 DIAGNOSIS — N281 Cyst of kidney, acquired: Secondary | ICD-10-CM | POA: Diagnosis not present

## 2020-06-30 DIAGNOSIS — N1831 Chronic kidney disease, stage 3a: Secondary | ICD-10-CM | POA: Insufficient documentation

## 2020-06-30 DIAGNOSIS — E7800 Pure hypercholesterolemia, unspecified: Secondary | ICD-10-CM | POA: Insufficient documentation

## 2020-06-30 NOTE — Progress Notes (Deleted)
Office Visit Note  Patient: Juan Yang             Date of Birth: May 05, 1955           MRN: [id number]             PCP: Monico Blitz, MD Referring: Monico Blitz, MD Visit Date: 07/11/2020 Occupation: @GUAROCC @  Subjective:  No chief complaint on file.   History of Present Illness: Juan Yang is a 66 y.o. male ***   Activities of Daily Living:  Patient reports morning stiffness for *** {minute/hour:19697}.   Patient {ACTIONS;DENIES/REPORTS:21021675::"Denies"} nocturnal pain.  Difficulty dressing/grooming: {ACTIONS;DENIES/REPORTS:21021675::"Denies"} Difficulty climbing stairs: {ACTIONS;DENIES/REPORTS:21021675::"Denies"} Difficulty getting out of chair: {ACTIONS;DENIES/REPORTS:21021675::"Denies"} Difficulty using hands for taps, buttons, cutlery, and/or writing: {ACTIONS;DENIES/REPORTS:21021675::"Denies"}  No Rheumatology ROS completed.   PMFS History:  Patient Active Problem List   Diagnosis Date Noted  . Basal cell carcinoma 06/07/2020  . Lumbar radiculopathy 05/02/2020  . Guaiac positive stools 10/30/2016  . Guaiac + stool 10/29/2016  . 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 Peripheral neuropathy 10/29/2016    Past Medical History:  Diagnosis Date  . AAA (abdominal aortic aneurysm) (HCC)    3.2 cm 02/01/18 CT, recommended [place] in 3 years  . Anxiety   . Arthritis   . 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 Charcot-Marie-Tooth disease type 2   . CKD (chronic kidney disease)   . Dyspnea   . Guaiac + stool 10/29/2016  . History of kidney stones   . Hypertension   . Peripheral neuropathy 10/29/2016    Family History  Problem Relation Age of Onset  . Charcot-Marie-Tooth disease Mother   . Heart disease Father   . COPD Father   . Arthritis Sister    Past Surgical History:  Procedure Laterality Date  . COLONOSCOPY WITH PROPOFOL N/A 11/14/2016   Procedure: COLONOSCOPY WITH PROPOFOL;  Surgeon: Rogene Houston, MD;  [facility];  Service: Endoscopy;  Laterality: N/A;  9:15  . LUMBAR LAMINECTOMY/DECOMPRESSION MICRODISCECTOMY Right 05/24/2019   Procedure: LAMINECTOMY AND FORAMINOTOMY LUMBAR FIVE- SACRAL ONE RIGHT;  Surgeon: Vallarie Mare, MD;  Location: [facility];  Service: Neurosurgery;  Laterality: Right;  posterior/right  . POLYPECTOMY  11/14/2016   Procedure: POLYPECTOMY- SIGMOID COLON X3 TRANSVERSE COLON X2;  Surgeon: Rogene Houston, MD;  [facility];  Service: Endoscopy;;  . rt ankle surgery     Titanium hinge  . TONSILLECTOMY    . TRANSFORAMINAL LUMBAR INTERBODY FUSION (TLIF) WITH PEDICLE SCREW FIXATION 1 LEVEL N/A 05/02/2020   Procedure: Lumbar five Sacral one Transforaminal lumbar interbody fusion;  Surgeon: Vallarie Mare, MD;  Location: [facility];  Service: Neurosurgery;  Laterality: N/A;   Social History   Social History Narrative   Right handed   Lives with wife in one story home    There is no immunization history on file for this patient.   Objective: Vital Signs: There were no vitals taken for this visit.   Physical Exam   Musculoskeletal Exam: ***  CDAI Exam: CDAI Score: -- Patient Global: --; Provider Global: -- Swollen: --; Tender: -- Joint Exam 07/11/2020   No joint exam has been documented for this visit   There is currently no information documented on the homunculus. Go to the Rheumatology activity and complete the homunculus joint exam.  Investigation: No additional findings.  Imaging: US RENAL  Result Date: 06/09/2020 CLINICAL DATA:  Stage 3 chronic kidney disease EXAM: RENAL / URINARY  TRACT ULTRASOUND COMPLETE COMPARISON:  CT abdomen pelvis 02/01/2018 FINDINGS: Right Kidney: Renal measurements: 10.9 x 4.4 x 5.2 cm = volume: 119 mL. Echogenicity within normal limits. No mass or hydronephrosis visualized. Two simple cysts are seen measuring 4.5 and 2.7 cm. Left Kidney: Renal measurements: 10.7 x 4.4 x 5.9 cm = volume: 143 mL. Echogenicity within normal  limits. No mass or hydronephrosis visualized. Bladder: Appears normal for degree of bladder distention. Other: Diffuse increased echogenicity of the visualized portions of the hepatic parenchyma are a nonspecific indicator of hepatocellular dysfunction, most commonly steatosis. IMPRESSION: No significant sonographic abnormality of the kidneys. Electronically Signed   By: Miachel Roux M.D.   On: 06/09/2020 13:47    Recent Labs: Lab Results  Component Value Date   WBC 11.4 (H) 05/30/2020   HGB 13.7 05/30/2020   PLT 291 05/30/2020   NA 136 05/30/2020   K 3.7 05/30/2020   CL 103 05/30/2020   CO2 24 05/30/2020   GLUCOSE 91 05/30/2020   BUN 13 05/30/2020   CREATININE 1.47 (H) 05/30/2020   BILITOT 0.6 05/30/2020   ALKPHOS 150 (H) 05/30/2020   AST 18 05/30/2020   ALT 19 05/30/2020   PROT 7.5 05/30/2020   ALBUMIN 3.9 05/30/2020   CALCIUM 9.3 05/30/2020   GFRAA 50 (L) 04/11/2020   QFTBGOLDPLUS NEGATIVE 04/11/2020   May 11, 2020 ENA negative, C3-C4 normal, anticardiolipin IgA 60.2 beta-2 GP 1 IgA> 65, lupus anticoagulant negative 04/11/20 UA -,ANA 1:160NS,RF-,ANCA-,MPO-,serine Protease 3-,Cryo-,IFE a faint monoclonal free lambda light chain detected Hep B-,Hep C-,HIV-,TPMT normal,Igs normal, CK 85,ESR19   Speciality Comments: No specialty comments available.  Procedures:  No procedures performed Allergies: Other, Atenolol, Cymbalta [duloxetine hcl], Latex, and Penicillins   Assessment / Plan:     Visit Diagnoses: Anticardiolipin antibody positive - Anticardiolipin IgA and beta-2 IgA positive,+ANA 1:160 NS.  Patient was evaluated by hematology.  He will have repeat labs in 3 months.  Alkaline phosphatase elevation - Bone scan was negative.  Rash - Skin biopsy consistent with basal cell carcinoma.  DDD (degenerative disc disease), lumbar - Status post back surgery with improvement in symptoms.  Chronic pain syndrome  Essential hypertension  Hypercholesteremia  Stage 3a chronic  kidney disease ([place]) - he was evaluated by nephrology.  History of COPD  Abnormal SPEP - He was evaluated by heme-onc.  He will have repeat labs in few weeks.  Anxiety and depression  Charcot Marie Tooth muscular atrophy  Neuropathy  Urticaria  Tobacco use disorder  Orders: No orders of the defined types were placed in this encounter.  No orders of the defined types were placed in this encounter.   Face-to-face time spent with patient was *** minutes. Greater than 50% of time was spent in counseling and coordination of care.  Follow-Up Instructions: No follow-ups on file.   Bo Merino, MD  Note - This record has been created using Editor, commissioning.  Chart creation errors have been sought, but may not always  have been located. Such creation errors do not reflect on  the standard of medical care.

## 2020-07-03 DIAGNOSIS — C44212 Basal cell carcinoma of skin of right ear and external auricular canal: Secondary | ICD-10-CM | POA: Diagnosis not present

## 2020-07-04 NOTE — Progress Notes (Deleted)
NEUROLOGY FOLLOW UP OFFICE NOTE  Juan Yang [id number]  Assessment/Plan:   1.  Acute encephalopathy, unclear etiology 2.  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  Charcot Marie Tooth disease 4.  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  He is accompanied by his wife who supplements history.  UPDATE: B12 and TSH from August 2021 were 257 and 2.29 respectively.  Advised to start B12 1076mcg daily.  EEG on 11/09/2019 was normal.  He has history of rash and has been worked up for vasculitis and subsequently diagnosed with SLE.   He also underwent right L5-S1 TLIF for lumbosacral radiculopathy.  MGUS?  HISTORY: On 09/08/2019 he developed some confusion, which gradually progressed by the evening.  He wasn't making sense.  He made bizarre statements.  Speech was slurred.  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  He has had tremors for a few years which became worse.  He had hallucinations, thinking that he saw people outside of the house.  In hindsight, he does not remember everything that happened.  A lot of it feels like a dream.  He presented to the ED at [facility] where he was admitted for concern of TIA.  CT of head and later MRI of brain snowed no acute intracranial abnormality.  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  UA negative.  UDS positive for cannabinoids, opiates and oxycodone, which he treats for chronic neuropathic pain.  Serum alcohol was negative.  Viral panel was negative.    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  He was discharged on ASA 325mg  daily.  CMP alp 131, cr 1.2, BUN 21  He has Charcot Marie Tooth disease, diagnosed in 2006.  Both his mother and brother  had CMT.  It has gradually progressed over the years.  He staggers and uses a cane.  He also has history of lumbar spinal stenosis and underwent surgery in April.  PAST MEDICAL HISTORY: Past Medical History:  Diagnosis Date  . AAA (abdominal aortic aneurysm) (HCC)    3.2 cm 02/01/18 CT, recommended [place] in 3 years  . Anxiety   . Arthritis   . 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 Charcot-Marie-Tooth disease type 2   . CKD (chronic kidney disease)   . Dyspnea   . Guaiac + stool 10/29/2016  . History of kidney stones   . Hypertension   . Peripheral neuropathy 10/29/2016    MEDICATIONS: Current Outpatient Medications on File Prior to Visit  Medication Sig Dispense Refill  . amLODipine (NORVASC) 2.5 MG tablet Take 2.5 mg by mouth 2 (two) times daily.    Marland Kitchen aspirin 325 MG tablet Take by mouth.    Marland Kitchen buPROPion (WELLBUTRIN XL) 300 MG 24 hr tablet Take 300 mg by mouth daily.    . Ca Carbonate-Mag Hydroxide (ROLAIDS PO) Take 2-3 tablets by mouth daily as needed (heartburn).    . clobetasol cream (TEMOVATE) 3.22 % Apply 1 application topically 2 (two) times daily as needed (rash).    . cyclobenzaprine (FLEXERIL) 10 MG tablet Take 1 tablet (10 mg total) by mouth 3 (three) times daily as needed for muscle spasms. 30 tablet 0  . docusate sodium (COLACE) 100 MG capsule  Take 1 capsule (100 mg total) by mouth 2 (two) times daily. 10 capsule 0  . gabapentin (NEURONTIN) 300 MG capsule Take 300 mg by mouth 3 (three) times daily. Additional 300 mg if needed for pain    . Glucosamine HCl-MSM (GLUCOSAMINE-MSM PO) Take 1 tablet by mouth daily.    . metoprolol succinate (TOPROL-XL) 25 MG 24 hr tablet Take 12.5 mg by mouth daily.    . nortriptyline (PAMELOR) 25 MG capsule Take 25 mg by mouth at bedtime.    . Omega-3 1000 MG CAPS Take 1,000 mg by mouth daily.    Marland Kitchen oxyCODONE-acetaminophen (PERCOCET/ROXICET) 5-325 MG tablet Take 1-2 tablets by mouth every 6 (six) hours as needed for moderate pain. 40  tablet 0  . Potassium 99 MG TABS Take 99 mg by mouth daily.    . sildenafil (VIAGRA) 100 MG tablet Take 100 mg by mouth daily as needed for erectile dysfunction.    Marland Kitchen tiZANidine (ZANAFLEX) 4 MG tablet Take by mouth.     No current facility-administered medications on file prior to visit.    ALLERGIES: Allergies  Allergen Reactions  . Other Hives    DUCK EGGS ONLY  . Atenolol Diarrhea and Nausea Only  . Cymbalta [Duloxetine Hcl] Other (See Comments)    Pt states it shut his bladder down and could not urinate; states it made him lethargic as well.  . Latex Itching  . Penicillins Hives    Did it involve swelling of the face/tongue/throat, SOB, or low BP? Unknown Did it involve sudden or severe rash/hives, skin peeling, or any reaction on the inside of your mouth or nose? Unknown Did you need to seek medical attention at a hospital or doctor's office? yes When did it last happen?Childhood If all above answers are "NO", may proceed with cephalosporin use.    FAMILY HISTORY: Family History  Problem Relation Age of Onset  . Charcot-Marie-Tooth disease Mother   . Heart disease Father   . COPD Father   . Arthritis Sister       Objective:  *** General: No acute distress.  Patient appears ***-groomed.   Head:  Normocephalic/atraumatic Eyes:  Fundi examined but not visualized Neck: supple, no paraspinal tenderness, full range of motion Heart:  Regular rate and rhythm Lungs:  Clear to auscultation bilaterally Back: No paraspinal tenderness Neurological Exam: alert and oriented to person, place, and time. Attention span and concentration intact, recent and remote memory intact, fund of knowledge intact.  Speech fluent and not dysarthric, language intact.  CN II-XII intact. Bulk and tone normal, muscle strength 5/5 throughout.  Sensation to light touch, temperature and vibration intact.  Deep tendon reflexes 2+ throughout, toes downgoing.  Finger to nose and heel to shin testing  intact.  Gait normal, Romberg negative.     Metta Clines, DO  CC: Monico Blitz, MD

## 2020-07-05 ENCOUNTER — Ambulatory Visit: Payer: Medicare HMO | Admitting: Neurology

## 2020-07-09 DIAGNOSIS — E7849 Other hyperlipidemia: Secondary | ICD-10-CM | POA: Diagnosis not present

## 2020-07-09 DIAGNOSIS — I10 Essential (primary) hypertension: Secondary | ICD-10-CM | POA: Diagnosis not present

## 2020-07-09 DIAGNOSIS — R69 Illness, unspecified: Secondary | ICD-10-CM | POA: Diagnosis not present

## 2020-07-11 ENCOUNTER — Ambulatory Visit: Payer: Medicare HMO | Admitting: Rheumatology

## 2020-07-11 DIAGNOSIS — F419 Anxiety disorder, unspecified: Secondary | ICD-10-CM

## 2020-07-11 DIAGNOSIS — F17200 Nicotine dependence, unspecified, uncomplicated: Secondary | ICD-10-CM

## 2020-07-11 DIAGNOSIS — M5136 Other intervertebral disc degeneration, lumbar region: Secondary | ICD-10-CM

## 2020-07-11 DIAGNOSIS — G629 Polyneuropathy, unspecified: Secondary | ICD-10-CM

## 2020-07-11 DIAGNOSIS — Z8709 Personal history of other diseases of the respiratory system: Secondary | ICD-10-CM

## 2020-07-11 DIAGNOSIS — R760 Raised antibody titer: Secondary | ICD-10-CM

## 2020-07-11 DIAGNOSIS — R21 Rash and other nonspecific skin eruption: Secondary | ICD-10-CM

## 2020-07-11 DIAGNOSIS — E7800 Pure hypercholesterolemia, unspecified: Secondary | ICD-10-CM

## 2020-07-11 DIAGNOSIS — N1831 Chronic kidney disease, stage 3a: Secondary | ICD-10-CM

## 2020-07-11 DIAGNOSIS — G600 Hereditary motor and sensory neuropathy: Secondary | ICD-10-CM

## 2020-07-11 DIAGNOSIS — G894 Chronic pain syndrome: Secondary | ICD-10-CM

## 2020-07-11 DIAGNOSIS — R748 Abnormal levels of other serum enzymes: Secondary | ICD-10-CM

## 2020-07-11 DIAGNOSIS — R778 Other specified abnormalities of plasma proteins: Secondary | ICD-10-CM

## 2020-07-11 DIAGNOSIS — I10 Essential (primary) hypertension: Secondary | ICD-10-CM

## 2020-07-11 DIAGNOSIS — L509 Urticaria, unspecified: Secondary | ICD-10-CM

## 2020-07-12 DIAGNOSIS — R809 Proteinuria, unspecified: Secondary | ICD-10-CM | POA: Diagnosis not present

## 2020-07-12 DIAGNOSIS — I129 Hypertensive chronic kidney disease with stage 1 through stage 4 chronic kidney disease, or unspecified chronic kidney disease: Secondary | ICD-10-CM | POA: Diagnosis not present

## 2020-07-12 DIAGNOSIS — K760 Fatty (change of) liver, not elsewhere classified: Secondary | ICD-10-CM | POA: Diagnosis not present

## 2020-07-12 DIAGNOSIS — N281 Cyst of kidney, acquired: Secondary | ICD-10-CM | POA: Diagnosis not present

## 2020-07-12 DIAGNOSIS — N1831 Chronic kidney disease, stage 3a: Secondary | ICD-10-CM | POA: Diagnosis not present

## 2020-07-12 DIAGNOSIS — E6609 Other obesity due to excess calories: Secondary | ICD-10-CM | POA: Diagnosis not present

## 2020-07-22 DIAGNOSIS — F17200 Nicotine dependence, unspecified, uncomplicated: Secondary | ICD-10-CM | POA: Insufficient documentation

## 2020-07-22 DIAGNOSIS — I10 Essential (primary) hypertension: Secondary | ICD-10-CM | POA: Insufficient documentation

## 2020-07-22 DIAGNOSIS — D472 Monoclonal gammopathy: Secondary | ICD-10-CM | POA: Insufficient documentation

## 2020-07-22 DIAGNOSIS — M5136 Other intervertebral disc degeneration, lumbar region: Secondary | ICD-10-CM | POA: Insufficient documentation

## 2020-07-22 DIAGNOSIS — F32A Depression, unspecified: Secondary | ICD-10-CM | POA: Insufficient documentation

## 2020-07-22 DIAGNOSIS — Z8709 Personal history of other diseases of the respiratory system: Secondary | ICD-10-CM | POA: Insufficient documentation

## 2020-07-22 DIAGNOSIS — E559 Vitamin D deficiency, unspecified: Secondary | ICD-10-CM | POA: Insufficient documentation

## 2020-07-22 DIAGNOSIS — K760 Fatty (change of) liver, not elsewhere classified: Secondary | ICD-10-CM | POA: Insufficient documentation

## 2020-07-22 DIAGNOSIS — N2581 Secondary hyperparathyroidism of renal origin: Secondary | ICD-10-CM | POA: Insufficient documentation

## 2020-07-22 NOTE — Progress Notes (Signed)
Office Visit Note  Patient: Juan Yang             Date of Birth: 25-Jan-1956           MRN: [id number]             PCP: Monico Blitz, MD Referring: Monico Blitz, MD Visit Date: 08/01/2020 Occupation: @GUAROCC @  Subjective:  Rash and abnormal labs.   History of Present Illness: Juan Yang is a 65 y.o. male returns today for follow-up visit.  He states he has not had any recurrence of rash since September 2021.  He underwent lumbar spine fusion in March.  He states the radiculopathy stopped but he has some localized lower back pain.  He continues to have some discomfort in his left knee.  He was also evaluated by hematology for anticardiolipin antibodies and MGUS.  He has a follow-up appointment coming up in August.  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  Activities of Daily Living:  Patient reports morning stiffness for all day. Patient Denies nocturnal pain.  Difficulty dressing/grooming: Denies Difficulty climbing stairs: Reports Difficulty getting out of chair: Reports Difficulty using hands for taps, buttons, cutlery, and/or writing: Denies  Review of Systems  Constitutional:  Positive for fatigue.  HENT:  Positive for mouth dryness. Negative for mouth sores and nose dryness.   Eyes:  Negative for pain, itching and dryness.  Respiratory:  Negative for shortness of breath and difficulty breathing.   Cardiovascular:  Negative for chest pain and palpitations.  Gastrointestinal:  Negative for blood in stool, constipation and diarrhea.  Endocrine: Negative for increased urination.  Genitourinary:  Negative for difficulty urinating.  Musculoskeletal:  Positive for joint pain, joint pain, joint swelling, myalgias, morning stiffness, muscle tenderness and myalgias.  Skin:  Negative for color change, rash and redness.  Allergic/Immunologic: Negative for susceptible to infections.  Neurological:  Positive for light-headedness,  numbness, headaches and weakness. Negative for dizziness and memory loss.  Hematological:  Positive for bruising/bleeding tendency.  Psychiatric/Behavioral:  Negative for confusion.    PMFS History:  Patient Active Problem List   Diagnosis Date Noted   Secondary hyperparathyroidism ([place]) 07/22/2020   Vitamin D deficiency 07/22/2020   Anxiety and depression 07/22/2020   MGUS (monoclonal gammopathy of unknown significance) 07/22/2020   History of COPD 07/22/2020   Tobacco use disorder 07/22/2020   Fatty liver 07/22/2020   Essential hypertension 07/22/2020   DDD (degenerative disc disease), lumbar 07/22/2020   Hypercholesteremia 06/30/2020   Stage 3a chronic kidney disease ([place]) 06/30/2020   Basal cell carcinoma 06/07/2020   Lumbar radiculopathy 05/02/2020   Guaiac positive stools 10/30/2016   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   Peripheral neuropathy 10/29/2016    Past Medical History:  Diagnosis Date   AAA (abdominal aortic aneurysm) ([place])    3.2 cm 02/01/18 CT, recommended [place] in 3 years   Anxiety    Arthritis    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   Charcot-Marie-Tooth disease type 2    CKD (chronic kidney disease)    Dyspnea    Guaiac + stool 10/29/2016   History of kidney stones    Hypertension    Peripheral neuropathy 10/29/2016    Family History  Problem Relation Age of Onset   Charcot-Marie-Tooth disease Mother    Heart disease Father    COPD Father    Arthritis Sister    Past Surgical History:  Procedure Laterality Date  COLONOSCOPY WITH PROPOFOL N/A 11/14/2016   Procedure: COLONOSCOPY WITH PROPOFOL;  Surgeon: Rogene Houston, MD;  [facility];  Service: Endoscopy;  Laterality: N/A;  9:15   LUMBAR LAMINECTOMY/DECOMPRESSION MICRODISCECTOMY Right 05/24/2019   Procedure: LAMINECTOMY AND FORAMINOTOMY LUMBAR FIVE- SACRAL ONE RIGHT;  Surgeon: Vallarie Mare, MD;  Location: [facility];  Service: Neurosurgery;  Laterality: Right;   posterior/right   MOHS SURGERY Right 05/2020   ear   POLYPECTOMY  11/14/2016   Procedure: POLYPECTOMY- SIGMOID COLON X3 TRANSVERSE COLON X2;  Surgeon: Rogene Houston, MD;  Location: [facility];  Service: Endoscopy;;   rt ankle surgery     Titanium hinge   TONSILLECTOMY     TRANSFORAMINAL LUMBAR INTERBODY FUSION (TLIF) WITH PEDICLE SCREW FIXATION 1 LEVEL N/A 05/02/2020   Procedure: Lumbar five Sacral one Transforaminal lumbar interbody fusion;  Surgeon: Vallarie Mare, MD;  Location: [place];  Service: Neurosurgery;  Laterality: N/A;   Social History   Social History Narrative   Right handed   Lives with wife in one story home    There is no immunization history on file for this patient.   Objective: Vital Signs: BP 129/90 (BP Location: Left Arm, Patient Position: Sitting, Cuff Size: Normal)   Pulse 96   Resp 17   Ht 6' (1.829 m)   Wt 224 lb (101.6 kg)   BMI 30.38 kg/m    Physical Exam Vitals and nursing note reviewed.  Constitutional:      Appearance: He is well-developed.  HENT:     Head: Normocephalic and atraumatic.  Eyes:     Conjunctiva/sclera: Conjunctivae normal.     Pupils: Pupils are equal, round, and reactive to light.  Cardiovascular:     Rate and Rhythm: Normal rate and regular rhythm.     Heart sounds: Normal heart sounds.  Pulmonary:     Effort: Pulmonary effort is normal.     Breath sounds: Normal breath sounds.  Abdominal:     General: Bowel sounds are normal.     Palpations: Abdomen is soft.  Musculoskeletal:     Cervical back: Normal range of motion and neck supple.  Skin:    General: Skin is warm and dry.     Capillary Refill: Capillary refill takes less than 2 seconds.  Neurological:     Mental Status: He is alert and oriented to person, place, and time.  Psychiatric:        Behavior: Behavior normal.     Musculoskeletal Exam: C-spine was in good range of motion.  He had painful limited range of motion of the lumbar spine.  Mild  tenderness over the right lumbar paravertebral region.  Shoulder joints, elbow joints, wrist joints, MCPs PIPs and DIPs with good range of motion with no synovitis.  Hip joints and knee joints with good range of motion.  No tenderness over ankles MTPs or PIPs.  CDAI Exam: CDAI Score: -- Patient Global: --; Provider Global: -- Swollen: --; Tender: -- Joint Exam 08/01/2020   No joint exam has been documented for this visit   There is currently no information documented on the homunculus. Go to the Rheumatology activity and complete the homunculus joint exam.  Investigation: No additional findings.  Imaging: No results found.  Recent Labs: Lab Results  Component Value Date   WBC 11.4 (H) 05/30/2020   HGB 13.7 05/30/2020   PLT 291 05/30/2020   NA 136 05/30/2020   K 3.7 05/30/2020   CL 103 05/30/2020  CO2 24 05/30/2020   GLUCOSE 91 05/30/2020   BUN 13 05/30/2020   CREATININE 1.47 (H) 05/30/2020   BILITOT 0.6 05/30/2020   ALKPHOS 150 (H) 05/30/2020   AST 18 05/30/2020   ALT 19 05/30/2020   PROT 7.5 05/30/2020   ALBUMIN 3.9 05/30/2020   CALCIUM 9.3 05/30/2020   GFRAA 50 (L) 04/11/2020   QFTBGOLDPLUS NEGATIVE 04/11/2020   May 12, 2018 lupus anticoagulant negative, beta-2 IgA> 65, anticardiolipin IgA 60.2, C3-C4 normal, ENA negative, immunoglobulins normal, SPEP abnormal, GFR 50, TB Gold negative  Speciality Comments: No specialty comments available.  Procedures:  No procedures performed Allergies: Other, Atenolol, Cymbalta [duloxetine hcl], Latex, and Penicillins   Assessment / Plan:     Visit Diagnoses: Rash - With antibiotics and prednisone August 2021 in September 2021.  Skin biopsy was advised at the last visit.  He has not had recurrence of rash.  He established with a dermatologist.  He will get a skin biopsy in the future if he develops rash.  All autoimmune work-up was negative except for beta-2 IgA and anticardiolipin antibodies.  All the lab results were  discussed with the patient.  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  There is no history of DVT or blood clots.  Hematology records were reviewed.  Repeat labs were ordered by hematology.  DDD (degenerative disc disease), lumbar - Status postlaminectomy April 2021 by Dr. Marcello Moores. s/p fusion 04/2020.  He is having some localized discomfort which could be related to facet joint arthropathy.  He has appointment coming up with the surgeon.  Records are reviewed.  Chronic pain of left knee - History of left knee joint pain due to limping.  He limps due to chronic lower back pain.  He had some warmth on palpation of his left knee.  I offered x-ray of the left knee joint which she declined.  We will discuss that again at the next visit.  Chronic pain syndrome - Treated with Percocet for lower back pain.  Essential hypertension-his blood pressure was mildly elevated today.  Hypercholesteremia  Fatty liver  Stage 3a chronic kidney disease ([place]) - Diagnosed 2018, followed by Dr. Theador Hawthorne.  He was placed on losartan for proteinuria.  Tobacco use disorder - 1 pack/day for 20 years.  He quit smoking in 2013.  He is vaping currently.  History of COPD  MGUS (monoclonal gammopathy of unknown significance) - Followed by oncology.  Charcot Marie Tooth muscular atrophy  Neuropathy - He takes gabapentin for peripheral neuropathy.  Anxiety and depression  Vitamin D deficiency-is taking vitamin D supplement.  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  BMI 30.0-30.9,adult  Orders: No orders of the defined types were placed in this encounter.  No orders of the defined types were placed in this encounter.    Follow-Up Instructions: No follow-ups on file.   Bo Merino, MD  Note - This record has been created using Editor, commissioning.  Chart creation errors have been  sought, but may not always  have been located. Such creation errors do not reflect on  the standard of medical care.

## 2020-08-01 ENCOUNTER — Other Ambulatory Visit: Payer: Self-pay

## 2020-08-01 ENCOUNTER — Ambulatory Visit: Payer: Medicare HMO | Admitting: Rheumatology

## 2020-08-01 ENCOUNTER — Encounter: Payer: Self-pay | Admitting: Rheumatology

## 2020-08-01 VITALS — BP 129/90 | HR 96 | Resp 17 | Ht 72.0 in | Wt 224.0 lb

## 2020-08-01 DIAGNOSIS — E7800 Pure hypercholesterolemia, unspecified: Secondary | ICD-10-CM

## 2020-08-01 DIAGNOSIS — E559 Vitamin D deficiency, unspecified: Secondary | ICD-10-CM

## 2020-08-01 DIAGNOSIS — G600 Hereditary motor and sensory neuropathy: Secondary | ICD-10-CM

## 2020-08-01 DIAGNOSIS — G629 Polyneuropathy, unspecified: Secondary | ICD-10-CM

## 2020-08-01 DIAGNOSIS — R21 Rash and other nonspecific skin eruption: Secondary | ICD-10-CM | POA: Diagnosis not present

## 2020-08-01 DIAGNOSIS — D472 Monoclonal gammopathy: Secondary | ICD-10-CM

## 2020-08-01 DIAGNOSIS — I10 Essential (primary) hypertension: Secondary | ICD-10-CM

## 2020-08-01 DIAGNOSIS — M5136 Other intervertebral disc degeneration, lumbar region: Secondary | ICD-10-CM | POA: Diagnosis not present

## 2020-08-01 DIAGNOSIS — K760 Fatty (change of) liver, not elsewhere classified: Secondary | ICD-10-CM | POA: Diagnosis not present

## 2020-08-01 DIAGNOSIS — R760 Raised antibody titer: Secondary | ICD-10-CM

## 2020-08-01 DIAGNOSIS — Z8709 Personal history of other diseases of the respiratory system: Secondary | ICD-10-CM

## 2020-08-01 DIAGNOSIS — G894 Chronic pain syndrome: Secondary | ICD-10-CM

## 2020-08-01 DIAGNOSIS — F32A Depression, unspecified: Secondary | ICD-10-CM

## 2020-08-01 DIAGNOSIS — G8929 Other chronic pain: Secondary | ICD-10-CM

## 2020-08-01 DIAGNOSIS — C44212 Basal cell carcinoma of skin of right ear and external auricular canal: Secondary | ICD-10-CM

## 2020-08-01 DIAGNOSIS — M25562 Pain in left knee: Secondary | ICD-10-CM

## 2020-08-01 DIAGNOSIS — F17200 Nicotine dependence, unspecified, uncomplicated: Secondary | ICD-10-CM

## 2020-08-01 DIAGNOSIS — N2581 Secondary hyperparathyroidism of renal origin: Secondary | ICD-10-CM

## 2020-08-01 DIAGNOSIS — R69 Illness, unspecified: Secondary | ICD-10-CM | POA: Diagnosis not present

## 2020-08-01 DIAGNOSIS — N1831 Chronic kidney disease, stage 3a: Secondary | ICD-10-CM | POA: Diagnosis not present

## 2020-08-01 DIAGNOSIS — F419 Anxiety disorder, unspecified: Secondary | ICD-10-CM

## 2020-08-01 DIAGNOSIS — M51369 Other intervertebral disc degeneration, lumbar region without mention of lumbar back pain or lower extremity pain: Secondary | ICD-10-CM

## 2020-08-01 DIAGNOSIS — Z6830 Body mass index (BMI) 30.0-30.9, adult: Secondary | ICD-10-CM

## 2020-09-11 ENCOUNTER — Inpatient Hospital Stay (HOSPITAL_COMMUNITY): Payer: Medicare HMO | Attending: Hematology

## 2020-09-11 ENCOUNTER — Encounter (HOSPITAL_COMMUNITY): Payer: Self-pay

## 2020-09-25 ENCOUNTER — Ambulatory Visit (HOSPITAL_COMMUNITY): Payer: Medicare HMO | Admitting: Physician Assistant

## 2020-10-03 DIAGNOSIS — E6609 Other obesity due to excess calories: Secondary | ICD-10-CM | POA: Diagnosis not present

## 2020-10-03 DIAGNOSIS — R809 Proteinuria, unspecified: Secondary | ICD-10-CM | POA: Diagnosis not present

## 2020-10-03 DIAGNOSIS — I129 Hypertensive chronic kidney disease with stage 1 through stage 4 chronic kidney disease, or unspecified chronic kidney disease: Secondary | ICD-10-CM | POA: Diagnosis not present

## 2020-10-03 DIAGNOSIS — K760 Fatty (change of) liver, not elsewhere classified: Secondary | ICD-10-CM | POA: Diagnosis not present

## 2020-10-03 DIAGNOSIS — N1831 Chronic kidney disease, stage 3a: Secondary | ICD-10-CM | POA: Diagnosis not present

## 2020-10-03 DIAGNOSIS — N281 Cyst of kidney, acquired: Secondary | ICD-10-CM | POA: Diagnosis not present

## 2020-10-05 DIAGNOSIS — R768 Other specified abnormal immunological findings in serum: Secondary | ICD-10-CM | POA: Diagnosis not present

## 2020-10-05 DIAGNOSIS — R809 Proteinuria, unspecified: Secondary | ICD-10-CM | POA: Diagnosis not present

## 2020-10-05 DIAGNOSIS — N182 Chronic kidney disease, stage 2 (mild): Secondary | ICD-10-CM | POA: Diagnosis not present

## 2020-10-05 DIAGNOSIS — E872 Acidosis: Secondary | ICD-10-CM | POA: Diagnosis not present

## 2020-10-05 DIAGNOSIS — E211 Secondary hyperparathyroidism, not elsewhere classified: Secondary | ICD-10-CM | POA: Diagnosis not present

## 2020-10-05 DIAGNOSIS — I129 Hypertensive chronic kidney disease with stage 1 through stage 4 chronic kidney disease, or unspecified chronic kidney disease: Secondary | ICD-10-CM | POA: Diagnosis not present

## 2020-10-08 ENCOUNTER — Inpatient Hospital Stay (HOSPITAL_COMMUNITY): Payer: Medicare HMO

## 2020-10-09 ENCOUNTER — Other Ambulatory Visit: Payer: Self-pay

## 2020-10-09 ENCOUNTER — Inpatient Hospital Stay (HOSPITAL_COMMUNITY): Payer: Medicare HMO | Attending: Hematology

## 2020-10-09 DIAGNOSIS — D72829 Elevated white blood cell count, unspecified: Secondary | ICD-10-CM | POA: Insufficient documentation

## 2020-10-09 DIAGNOSIS — R899 Unspecified abnormal finding in specimens from other organs, systems and tissues: Secondary | ICD-10-CM | POA: Insufficient documentation

## 2020-10-09 DIAGNOSIS — D472 Monoclonal gammopathy: Secondary | ICD-10-CM | POA: Insufficient documentation

## 2020-10-09 LAB — CBC WITH DIFFERENTIAL/PLATELET
Abs Immature Granulocytes: 0.06 10*3/uL (ref 0.00–0.07)
Basophils Absolute: 0.1 10*3/uL (ref 0.0–0.1)
Basophils Relative: 1 %
Eosinophils Absolute: 0.4 10*3/uL (ref 0.0–0.5)
Eosinophils Relative: 3 %
HCT: 42.2 % (ref 39.0–52.0)
Hemoglobin: 14.3 g/dL (ref 13.0–17.0)
Immature Granulocytes: 1 %
Lymphocytes Relative: 28 %
Lymphs Abs: 3.4 10*3/uL (ref 0.7–4.0)
MCH: 31.6 pg (ref 26.0–34.0)
MCHC: 33.9 g/dL (ref 30.0–36.0)
MCV: 93.2 fL (ref 80.0–100.0)
Monocytes Absolute: 0.9 10*3/uL (ref 0.1–1.0)
Monocytes Relative: 7 %
Neutro Abs: 7.5 10*3/uL (ref 1.7–7.7)
Neutrophils Relative %: 60 %
Platelets: 311 10*3/uL (ref 150–400)
RBC: 4.53 MIL/uL (ref 4.22–5.81)
RDW: 13.2 % (ref 11.5–15.5)
WBC: 12.4 10*3/uL — ABNORMAL HIGH (ref 4.0–10.5)
nRBC: 0 % (ref 0.0–0.2)

## 2020-10-10 DIAGNOSIS — R69 Illness, unspecified: Secondary | ICD-10-CM | POA: Diagnosis not present

## 2020-10-10 DIAGNOSIS — I10 Essential (primary) hypertension: Secondary | ICD-10-CM | POA: Diagnosis not present

## 2020-10-10 DIAGNOSIS — E7849 Other hyperlipidemia: Secondary | ICD-10-CM | POA: Diagnosis not present

## 2020-10-10 LAB — KAPPA/LAMBDA LIGHT CHAINS
Kappa free light chain: 50.6 mg/L — ABNORMAL HIGH (ref 3.3–19.4)
Kappa, lambda light chain ratio: 1.35 (ref 0.26–1.65)
Lambda free light chains: 37.5 mg/L — ABNORMAL HIGH (ref 5.7–26.3)

## 2020-10-11 LAB — BETA-2-GLYCOPROTEIN I ABS, IGG/M/A
Beta-2 Glyco I IgG: <9 GPI IgG units (ref 0–20)
Beta-2-Glycoprotein I IgA: 16 GPI IgA units (ref 0–25)
Beta-2-Glycoprotein I IgM: 88 GPI IgM units — ABNORMAL HIGH (ref 0–32)

## 2020-10-12 LAB — CARDIOLIPIN ANTIBODIES, IGG, IGM, IGA
Anticardiolipin IgA: <9 APL U/mL (ref 0–11)
Anticardiolipin IgG: <9 GPL U/mL (ref 0–14)
Anticardiolipin IgM: 19 MPL U/mL — ABNORMAL HIGH (ref 0–12)

## 2020-10-14 NOTE — Progress Notes (Signed)
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 male returns for routine follow-up of MGUS and abnormal coagulopathy labs.  He was seen for initial consultation in 05/30/2020, and his most recent follow-up visit was with Juan Abernethy, PA-C on 06/19/2020.  At today's visit, he reports feeling fair.  No recent hospitalizations, surgeries, or changes in baseline health status.  Patient continues to have significant chronic fatigue, chronic pain, chronic neuropathy, and chronic ataxia.  He reports that the symptoms have been progressively worsening over the past several months.  He continues to follow-up with rheumatology and dermatology for rash suspicious for vasculitis, and reports that he had a recurrence of the rash that started last week.  He denies any new bony pain, but does report that he has chronic "aches all over." As noted above, he has chronic neuropathy and ataxia, but denies any new neurologic symptoms such as tinnitus, new-onset hearing loss, blurred vision, headache, or dizziness.  He has not had any signs or symptoms of DVT or PE.  No signs of blood loss such as hematochezia, melena, or hematuria.  No chest pain on exertion, shortness of breath on minimal exertion, pre-syncopal episodes, or palpitations. No new masses or lymphadenopathy per his report.  No B symptoms such as fever, chills, night sweats, or unintentional weight loss; he does report that he sweats easily with exertion during the daytime.  He has 40% energy and 50% appetite. He endorses that he is maintaining a stable weight.   REVIEW OF SYSTEMS:  Review of Systems  Constitutional:  Positive for diaphoresis (sweats  with minimal exertion; no night sweats) and fatigue. Negative for appetite change, chills, fever and unexpected weight change.  HENT:   Negative for lump/mass and nosebleeds.   Eyes:  Negative for eye problems.  Respiratory:  Negative for cough, hemoptysis and shortness of breath.   Cardiovascular:  Positive for palpitations. Negative for chest pain and leg swelling.  Gastrointestinal:  Positive for constipation. Negative for abdominal pain, blood in stool, diarrhea, nausea and vomiting.  Genitourinary:  Negative for hematuria.   Musculoskeletal:  Positive for arthralgias, back pain, gait problem and myalgias.  Skin:  Positive for rash.  Neurological:  Positive for dizziness, gait problem and numbness. Negative for headaches and light-headedness.  Hematological:  Does not bruise/bleed easily.  Psychiatric/Behavioral:  Positive for suicidal ideas.      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   Charcot-Marie-Tooth disease type 2    CKD (chronic kidney disease)    Dyspnea    Guaiac + stool 10/29/2016   History of kidney stones    Hypertension    Peripheral neuropathy 10/29/2016   Past Surgical History:  Procedure Laterality Date   COLONOSCOPY WITH PROPOFOL N/A 11/14/2016   Procedure: COLONOSCOPY WITH PROPOFOL;  Surgeon: Rogene Houston, MD;  [facility];  Service: Endoscopy;  Laterality: N/A;  9:15   LUMBAR LAMINECTOMY/DECOMPRESSION MICRODISCECTOMY Right 05/24/2019   Procedure: LAMINECTOMY AND FORAMINOTOMY LUMBAR FIVE- SACRAL  ONE RIGHT;  Surgeon: Vallarie Mare, MD;  Location: [facility];  Service: Neurosurgery;  Laterality: Right;  posterior/right   MOHS SURGERY Right 05/2020   ear   POLYPECTOMY  11/14/2016   Procedure: POLYPECTOMY- SIGMOID COLON X3 TRANSVERSE COLON X2;  Surgeon:  Rogene Houston, MD;  [facility];  Service: Endoscopy;;   rt ankle surgery     Titanium hinge   TONSILLECTOMY     TRANSFORAMINAL LUMBAR INTERBODY FUSION (TLIF) WITH PEDICLE SCREW FIXATION 1 LEVEL N/A 05/02/2020   Procedure: Lumbar five Sacral one Transforaminal lumbar interbody fusion;  Surgeon: Vallarie Mare, MD;  Location: [facility];  Service: Neurosurgery;  Laterality: N/A;     SOCIAL HISTORY:  Social History   Socioeconomic History   Marital status: Married    Spouse name: Not on file   Number of children: Not on file   Years of education: Not on file   Highest education level: Not on file  Occupational History   Not on file  Tobacco Use   Smoking status: Former    Packs/day: 1.50    Years: 20.00    Pack years: 30.00    Types: Cigarettes    Quit date: 11/12/2012    Years since quitting: 7.9   Smokeless tobacco: Never  Vaping Use   Vaping Use: Some days  Substance and Sexual Activity   Alcohol use: No   Drug use: No   Sexual activity: Yes    Birth control/protection: None  Other Topics Concern   Not on file  Social History Narrative   Right handed   Lives with wife in one story home   Social Determinants of Health   Financial Resource Strain: Not on file  Food Insecurity: Not on file  Transportation Needs: No Transportation Needs   Lack of Transportation (Medical): No   Lack of Transportation (Non-Medical): No  Physical Activity: Inactive   Days of Exercise per Week: 0 days   Minutes of Exercise per Session: 0 min  Stress: Not on file  Social Connections: Not on file  Intimate Partner Violence: Not At Risk   Fear of Current or Ex-Partner: No   Emotionally Abused: No   Physically Abused: No   Sexually Abused: No    FAMILY HISTORY:  Family History  Problem Relation Age of Onset   Charcot-Marie-Tooth disease Mother    Heart disease Father    COPD Father    Arthritis Sister     CURRENT MEDICATIONS:  Outpatient Encounter Medications  as of 10/16/2020  Medication Sig   amLODipine (NORVASC) 2.5 MG tablet Take 2.5 mg by mouth 2 (two) times daily.   buPROPion (WELLBUTRIN XL) 300 MG 24 hr tablet Take 300 mg by mouth daily.   Ca Carbonate-Mag Hydroxide (ROLAIDS PO) Take 2-3 tablets by mouth daily as needed (heartburn).   clobetasol cream (TEMOVATE) 5.62 % Apply 1 application topically 2 (two) times daily as needed (rash).   cyclobenzaprine (FLEXERIL) 10 MG tablet Take 1 tablet (10 mg total) by mouth 3 (three) times daily as needed for muscle spasms.   docusate sodium (COLACE) 100 MG capsule Take 1 capsule (100 mg total) by mouth 2 (two) times daily. (Patient taking differently: Take 100 mg by mouth as needed.)   gabapentin (NEURONTIN) 300 MG capsule Take 300 mg by mouth 3 (three) times daily. Additional 300 mg if needed for pain   Glucosamine HCl-MSM (GLUCOSAMINE-MSM PO) Take 1 tablet by mouth daily.   losartan (COZAAR) 25 MG tablet  Take 12.5 mg by mouth daily.   metoprolol succinate (TOPROL-XL) 25 MG 24 hr tablet Take 12.5 mg by mouth daily.   nortriptyline (PAMELOR) 25 MG capsule Take 25 mg by mouth at bedtime.   Omega-3 1000 MG CAPS Take 1,000 mg by mouth daily.   oxyCODONE-acetaminophen (PERCOCET/ROXICET) 5-325 MG tablet Take 1-2 tablets by mouth every 6 (six) hours as needed for moderate pain.   Potassium 99 MG TABS Take 99 mg by mouth daily.   sildenafil (VIAGRA) 100 MG tablet Take 100 mg by mouth daily as needed for erectile dysfunction.   tiZANidine (ZANAFLEX) 4 MG tablet Take by mouth. (Patient not taking: Reported on 08/01/2020)   No facility-administered encounter medications on file as of 10/16/2020.    ALLERGIES:  Allergies  Allergen Reactions   Other Hives    DUCK EGGS ONLY   Atenolol Diarrhea and Nausea Only   Cymbalta [Duloxetine Hcl] Other (See Comments)    Pt states it shut his bladder down and could not urinate; states it made him lethargic as well.   Latex Itching   Penicillins Hives    Did it involve  swelling of the face/tongue/throat, SOB, or low BP? Unknown Did it involve sudden or severe rash/hives, skin peeling, or any reaction on the inside of your mouth or nose? Unknown Did you need to seek medical attention at a hospital or doctor's office? yes When did it last happen?      Childhood If all above answers are "NO", may proceed with cephalosporin use.     PHYSICAL EXAM:  ECOG PERFORMANCE STATUS: 2 - Symptomatic, <50% confined to bed  There were no vitals filed for this visit. There were no vitals filed for this visit. Physical Exam Constitutional:      Appearance: Normal appearance. He is obese.  HENT:     Head: Normocephalic and atraumatic.     Mouth/Throat:     Mouth: Mucous membranes are moist.  Eyes:     Extraocular Movements: Extraocular movements intact.     Pupils: Pupils are equal, round, and reactive to light.  Cardiovascular:     Rate and Rhythm: Regular rhythm. Tachycardia present.     Pulses: Normal pulses.     Heart sounds: Normal heart sounds.  Pulmonary:     Effort: Pulmonary effort is normal.     Breath sounds: Normal breath sounds.  Abdominal:     General: Bowel sounds are normal.     Palpations: Abdomen is soft.     Tenderness: There is no abdominal tenderness.  Musculoskeletal:        General: No swelling.     Right lower leg: No edema.     Left lower leg: No edema.  Lymphadenopathy:     Cervical: No cervical adenopathy.  Skin:    General: Skin is warm and dry.     Comments: Rash on left foot/ankle  Neurological:     General: No focal deficit present.     Mental Status: He is alert and oriented to person, place, and time.  Psychiatric:        Mood and Affect: Mood normal.        Behavior: Behavior normal.     LABORATORY DATA:  I have reviewed the labs as listed.  CBC    Component Value Date/Time   WBC 12.4 (H) 10/09/2020 1301   RBC 4.53 10/09/2020 1301   HGB 14.3 10/09/2020 1301   HGB 14.5 10/10/2019 1635   HCT 42.2 10/09/2020  1301   HCT 42.0 10/10/2019 1635   PLT 311 10/09/2020 1301   PLT 269 10/10/2019 1635   MCV 93.2 10/09/2020 1301   MCV 93 10/10/2019 1635   MCH 31.6 10/09/2020 1301   MCHC 33.9 10/09/2020 1301   RDW 13.2 10/09/2020 1301   RDW 12.8 10/10/2019 1635   LYMPHSABS 3.4 10/09/2020 1301   LYMPHSABS 2.2 10/10/2019 1635   MONOABS 0.9 10/09/2020 1301   EOSABS 0.4 10/09/2020 1301   EOSABS 0.8 (H) 10/10/2019 1635   BASOSABS 0.1 10/09/2020 1301   BASOSABS 0.1 10/10/2019 1635   CMP Latest Ref Rng & Units 05/30/2020 05/01/2020 04/11/2020  Glucose 70 - 99 mg/dL 91 87 -  BUN 8 - 23 mg/dL 13 11 -  Creatinine 0.61 - 1.24 mg/dL 1.47(H) 1.54(H) -  Sodium 135 - 145 mmol/L 136 137 -  Potassium 3.5 - 5.1 mmol/L 3.7 4.0 -  Chloride 98 - 111 mmol/L 103 103 -  CO2 22 - 32 mmol/L 24 28 -  Calcium 8.9 - 10.3 mg/dL 9.3 9.8 -  Total Protein 6.5 - 8.1 g/dL 7.5 - 7.0  Total Bilirubin 0.3 - 1.2 mg/dL 0.6 - -  Alkaline Phos 38 - 126 U/L 150(H) - -  AST 15 - 41 U/L 18 - -  ALT 0 - 44 U/L 19 - -    DIAGNOSTIC IMAGING:  I have independently reviewed the relevant imaging and discussed with the patient.  ASSESSMENT & PLAN: 1.  Positive for IgA anticardiolipin antibody and IgA antibeta-2 glycoprotein 1 antibody - Initial panel drawn on 05/11/2020:  IgA anticardiolipin antibody (60.2) and IgA antibeta-2 glycoprotein 1 antibody (> 65.0) - Repeat panel drawn on 10/09/2020: IgA anticardiolipin antibodies with normal (< 9), but IgM anticardiolipin antibody is now mildly elevated (19); similarly, IgA anticipated to glycoprotein 1 antibody is now normal (16), but IgM beta-2 glycoprotein 1 antibody is positive (88). - Patient denies any previous history of clotting with DVT or PE.  He does have a history of questionable TIAs x 3 -There is no family history of blood clots, although his mother did have 1 miscarriage. - Due to inconsistent elevation in antibodies, do not suspect any true coagulopathy in this patient.   - PLAN:  No  indication for further intervention or work-up at this time.  2.  Monoclonal gammopathy of undetermined significance - Work-up by rheumatology (April 2022) showed faint monoclonal gammopathy: SPEP with a poorly-defined band of restricted protein mobility in the gammaglobulin region, that may represent a monoclonal protein.  IFE detected faint monoclonal free lambda light chain. - MGUS/myeloma panel completed on 05/30/20 by hematology clinic was unremarkable: SPEP showed no M spike, IFE negative for monoclonal protein, 24-hour urine with UPEP/IFE showed no M spike or monoclonal protein; - LDH normal, beta microglobulin mildly elevated 2.9 - Skeletal survey (05/30/2020) no lytic lesion, prior sclerotic lesion of right tibia once again noted - No CRAB features per hematology workup in April 2022 - Free lights chains (10/09/2020) showed elevated kappa 50.6, elevated lambda 37.5, but normal ratio 1.35 - consistent with underlying CKD stage III - Patient has chronic pain, no new bony pains - Patient has chronic neuropathy and ataxia, and  reports that both of these are getting worse; intermittent blurry vision and questionable history of TIAs x3  - No red-flag lab values or symptoms at this time, but patient may have low-grade or developing plasma cell dyscrasia - PLAN: Repeat MGUS/myeloma panel in 6 months.  RTC after labs.  3.  Leukocytosis - Patient has had persistent leukocytosis since August 2021, ranging form 11.4 - 12.4; intermittently elevated neutrophils, eosinophils, and basophils - Likely reactive related to rheumatologic process (ANA positive, undergoing rheumatology work-up for possible vasculitis) - May also be reactive related to vaping - No B symptoms apart from fatigue - Most recent CBC (10/09/2020) showed WBC 12.4 with normal differential - PLAN:  Likely reactive, but we will check BCR/ABL FISH and JAK2 w/ reflex at lab draw in 6 months to rule out MPN.   PLAN SUMMARY &  DISPOSITION: - Labs in 6 months - RTC after labs  All questions were answered. The patient knows to call the clinic with any problems, questions or concerns.  Medical decision making: Low  Time spent on visit: I spent 20 minutes counseling the patient face to face. The total time spent in the appointment was 30 minutes and more than 50% was on counseling.   Harriett Rush, PA-C  10/16/2020 2:46 PM

## 2020-10-16 ENCOUNTER — Inpatient Hospital Stay (HOSPITAL_COMMUNITY): Payer: Medicare HMO | Attending: Hematology | Admitting: Physician Assistant

## 2020-10-16 ENCOUNTER — Other Ambulatory Visit: Payer: Self-pay

## 2020-10-16 VITALS — BP 113/81 | HR 97 | Temp 96.9°F | Resp 18 | Wt 218.2 lb

## 2020-10-16 DIAGNOSIS — N1831 Chronic kidney disease, stage 3a: Secondary | ICD-10-CM | POA: Insufficient documentation

## 2020-10-16 DIAGNOSIS — F419 Anxiety disorder, unspecified: Secondary | ICD-10-CM | POA: Diagnosis not present

## 2020-10-16 DIAGNOSIS — Z8261 Family history of arthritis: Secondary | ICD-10-CM | POA: Insufficient documentation

## 2020-10-16 DIAGNOSIS — Z79899 Other long term (current) drug therapy: Secondary | ICD-10-CM | POA: Diagnosis not present

## 2020-10-16 DIAGNOSIS — R270 Ataxia, unspecified: Secondary | ICD-10-CM | POA: Insufficient documentation

## 2020-10-16 DIAGNOSIS — R002 Palpitations: Secondary | ICD-10-CM | POA: Insufficient documentation

## 2020-10-16 DIAGNOSIS — Z87891 Personal history of nicotine dependence: Secondary | ICD-10-CM | POA: Diagnosis not present

## 2020-10-16 DIAGNOSIS — M549 Dorsalgia, unspecified: Secondary | ICD-10-CM | POA: Insufficient documentation

## 2020-10-16 DIAGNOSIS — G8929 Other chronic pain: Secondary | ICD-10-CM | POA: Diagnosis not present

## 2020-10-16 DIAGNOSIS — R768 Other specified abnormal immunological findings in serum: Secondary | ICD-10-CM | POA: Diagnosis not present

## 2020-10-16 DIAGNOSIS — R5382 Chronic fatigue, unspecified: Secondary | ICD-10-CM | POA: Diagnosis not present

## 2020-10-16 DIAGNOSIS — Z87442 Personal history of urinary calculi: Secondary | ICD-10-CM | POA: Diagnosis not present

## 2020-10-16 DIAGNOSIS — M2550 Pain in unspecified joint: Secondary | ICD-10-CM | POA: Insufficient documentation

## 2020-10-16 DIAGNOSIS — R61 Generalized hyperhidrosis: Secondary | ICD-10-CM | POA: Diagnosis not present

## 2020-10-16 DIAGNOSIS — G629 Polyneuropathy, unspecified: Secondary | ICD-10-CM | POA: Diagnosis not present

## 2020-10-16 DIAGNOSIS — D472 Monoclonal gammopathy: Secondary | ICD-10-CM | POA: Insufficient documentation

## 2020-10-16 DIAGNOSIS — R2689 Other abnormalities of gait and mobility: Secondary | ICD-10-CM | POA: Insufficient documentation

## 2020-10-16 DIAGNOSIS — I129 Hypertensive chronic kidney disease with stage 1 through stage 4 chronic kidney disease, or unspecified chronic kidney disease: Secondary | ICD-10-CM | POA: Diagnosis not present

## 2020-10-16 DIAGNOSIS — Z836 Family history of other diseases of the respiratory system: Secondary | ICD-10-CM | POA: Insufficient documentation

## 2020-10-16 DIAGNOSIS — M791 Myalgia, unspecified site: Secondary | ICD-10-CM | POA: Diagnosis not present

## 2020-10-16 DIAGNOSIS — K5900 Constipation, unspecified: Secondary | ICD-10-CM | POA: Insufficient documentation

## 2020-10-16 DIAGNOSIS — Z888 Allergy status to other drugs, medicaments and biological substances status: Secondary | ICD-10-CM | POA: Insufficient documentation

## 2020-10-16 DIAGNOSIS — Z880 Allergy status to penicillin: Secondary | ICD-10-CM | POA: Insufficient documentation

## 2020-10-16 DIAGNOSIS — Z8249 Family history of ischemic heart disease and other diseases of the circulatory system: Secondary | ICD-10-CM | POA: Diagnosis not present

## 2020-10-16 DIAGNOSIS — D72829 Elevated white blood cell count, unspecified: Secondary | ICD-10-CM | POA: Diagnosis not present

## 2020-10-16 NOTE — Patient Instructions (Signed)
[facility] at [facility] Discharge Instructions  You were seen today by Tarri Abernethy PA-C for your abnormal labs.  Previous labs showed abnormal anticardiolipin antibodies, which if consistently elevated can be a sign of increased risk of clotting.  HOWEVER, in your case, these labs are not consistently elevated in such a way to indicate any underlying blood clotting disorder.  You do not need any further testing or treatment for these abnormal labs at this time.  We will continue to follow you for your abnormal protein (MGUS/"monoclonal gammopathy of undetermined significance").  You will be due for repeat labs and follow-up visit in about 6 months.  LABS: Return in 6 months for repeat labs  OTHER TESTS: None at this time  MEDICATIONS: No changes to home medications  FOLLOW-UP APPOINTMENT: Office visit in 6 months, after labs   Thank you for choosing [facility] at [facility] to provide your oncology and hematology care.  To afford each patient quality time with our provider, please arrive at least 15 minutes before your scheduled appointment time.   If you have a lab appointment with the [facility] please come in thru the Main Entrance and check in at the main information desk.  You need to re-schedule your appointment should you arrive 10 or more minutes late.  We strive to give you quality time with our providers, and arriving late affects you and other patients whose appointments are after yours.  Also, if you no show three or more times for appointments you may be dismissed from the clinic at the providers discretion.     Again, thank you for choosing [facility].  Our hope is that these requests will decrease the amount of time that you wait before being seen by our physicians.       _____________________________________________________________  Should you have questions after your visit to [facility],  please contact our office at [phone] and follow the prompts.  Our office hours are 8:00 a.m. and 4:30 p.m. Monday - Friday.  Please note that voicemails left after 4:00 p.m. may not be returned until the following business day.  We are closed weekends and major holidays.  You do have access to a nurse 24-7, just call the main number to the clinic [phone] and do not press any options, hold on the line and a nurse will answer the phone.    For prescription refill requests, have your pharmacy contact our office and allow 72 hours.    Due to Covid, you will need to wear a mask upon entering the hospital. If you do not have a mask, a mask will be given to you at the Main Entrance upon arrival. For doctor visits, patients may have 1 support person age 65 or older with them. For treatment visits, patients can not have anyone with them due to social distancing guidelines and our immunocompromised population.

## 2020-10-17 ENCOUNTER — Other Ambulatory Visit: Payer: Self-pay

## 2020-10-17 DIAGNOSIS — L308 Other specified dermatitis: Secondary | ICD-10-CM | POA: Diagnosis not present

## 2020-10-17 DIAGNOSIS — L309 Dermatitis, unspecified: Secondary | ICD-10-CM | POA: Diagnosis not present

## 2020-10-29 DIAGNOSIS — M5416 Radiculopathy, lumbar region: Secondary | ICD-10-CM | POA: Diagnosis not present

## 2020-10-29 DIAGNOSIS — I10 Essential (primary) hypertension: Secondary | ICD-10-CM | POA: Diagnosis not present

## 2020-10-29 DIAGNOSIS — Z6829 Body mass index (BMI) 29.0-29.9, adult: Secondary | ICD-10-CM | POA: Diagnosis not present

## 2020-11-15 NOTE — Progress Notes (Signed)
Office Visit Note  Patient: Juan Yang             Date of Birth: 11-18-1955           MRN: [id number]             PCP: Monico Blitz, MD Referring: Monico Blitz, MD Visit Date: 11/28/2020 Occupation: @GUAROCC @  Subjective:  Pain in lower back and knees..   History of Present Illness: Juan Yang is a 65 y.o. male with a history of osteoarthritis, degenerative disc disease and positive anticardiolipin antibodies.  He states that he followed up with the nephrologist and his GFR is improving.  He was placed on losartan.  He also had an appointment with the dermatologist.  The skin biopsy was consistent with dermatitis.  He has been seeing hematologist for abnormal SPEP.  He has been taking aspirin 325 mg daily for the pain management.  He continues to have discomfort in the lower back which sometimes radiates to his left lower extremity.  He takes gabapentin which helps the neuropathy.  He also has discomfort in his knee joints off and on.  He denies having recent rash.  Activities of Daily Living:  Patient reports morning stiffness for 1 hour.   Patient Denies nocturnal pain.  Difficulty dressing/grooming: Denies Difficulty climbing stairs: Reports Difficulty getting out of chair: Reports Difficulty using hands for taps, buttons, cutlery, and/or writing: Denies  Review of Systems  Constitutional:  Positive for fatigue. Negative for night sweats.  HENT:  Negative for mouth sores, mouth dryness and nose dryness.   Eyes:  Negative for redness and dryness.  Respiratory:  Negative for shortness of breath and difficulty breathing.   Cardiovascular:  Negative for chest pain, palpitations, hypertension, irregular heartbeat and swelling in legs/feet.  Gastrointestinal:  Negative for constipation and diarrhea.  Endocrine: Negative for increased urination.  Musculoskeletal:  Positive for joint pain, joint pain, myalgias, morning stiffness and myalgias. Negative for joint swelling,  muscle weakness and muscle tenderness.  Skin:  Negative for color change, rash, hair loss, nodules/bumps, skin tightness, ulcers and sensitivity to sunlight.  Allergic/Immunologic: Negative for susceptible to infections.  Neurological:  Negative for dizziness, fainting, memory loss, night sweats and weakness ( ).  Hematological:  Negative for swollen glands.  Psychiatric/Behavioral:  Positive for sleep disturbance. Negative for depressed mood. The patient is not nervous/anxious.    PMFS History:  Patient Active Problem List   Diagnosis Date Noted   Secondary hyperparathyroidism ([place]) 07/22/2020   Vitamin D deficiency 07/22/2020   Anxiety and depression 07/22/2020   MGUS (monoclonal gammopathy of unknown significance) 07/22/2020   History of COPD 07/22/2020   Tobacco use disorder 07/22/2020   Fatty liver 07/22/2020   Essential hypertension 07/22/2020   DDD (degenerative disc disease), lumbar 07/22/2020   Hypercholesteremia 06/30/2020   Stage 3a chronic kidney disease ([place]) 06/30/2020   Basal cell carcinoma 06/07/2020   Lumbar radiculopathy 05/02/2020   Guaiac positive stools 10/30/2016   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   Peripheral neuropathy 10/29/2016    Past Medical History:  Diagnosis Date   AAA (abdominal aortic aneurysm)    3.2 cm 02/01/18 CT, recommended [place] in 3 years   Anxiety    Arthritis    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   Charcot-Marie-Tooth disease type 2    CKD (chronic kidney disease)    Dyspnea    Guaiac + stool  10/29/2016   History of kidney stones    Hypertension    Peripheral neuropathy 10/29/2016    Family History  Problem Relation Age of Onset   Charcot-Marie-Tooth disease Mother    Heart disease Father    COPD Father    Arthritis Sister    Past Surgical History:  Procedure Laterality Date   COLONOSCOPY WITH PROPOFOL N/A 11/14/2016   Procedure: COLONOSCOPY WITH  PROPOFOL;  Surgeon: Rogene Houston, MD;  [facility];  Service: Endoscopy;  Laterality: N/A;  9:15   LUMBAR LAMINECTOMY/DECOMPRESSION MICRODISCECTOMY Right 05/24/2019   Procedure: LAMINECTOMY AND FORAMINOTOMY LUMBAR FIVE- SACRAL ONE RIGHT;  Surgeon: Vallarie Mare, MD;  Location: [facility];  Service: Neurosurgery;  Laterality: Right;  posterior/right   MOHS SURGERY Right 05/2020   ear   MOHS SURGERY     POLYPECTOMY  11/14/2016   Procedure: POLYPECTOMY- SIGMOID COLON X3 TRANSVERSE COLON X2;  Surgeon: Rogene Houston, MD;  [facility];  Service: Endoscopy;;   rt ankle surgery     Titanium hinge   TONSILLECTOMY     TRANSFORAMINAL LUMBAR INTERBODY FUSION (TLIF) WITH PEDICLE SCREW FIXATION 1 LEVEL N/A 05/02/2020   Procedure: Lumbar five Sacral one Transforaminal lumbar interbody fusion;  Surgeon: Vallarie Mare, MD;  Location: [facility];  Service: Neurosurgery;  Laterality: N/A;   Social History   Social History Narrative   Right handed   Lives with wife in one story home    There is no immunization history on file for this patient.   Objective: Vital Signs: BP (!) 157/103 (BP Location: Left Arm, Patient Position: Sitting, Cuff Size: Small)   Pulse 92   Resp 12   Ht 6' (1.829 m)   Wt 220 lb (99.8 kg)   BMI 29.84 kg/m    Physical Exam Vitals and nursing note reviewed.  Constitutional:      Appearance: He is well-developed.  HENT:     Head: Normocephalic and atraumatic.  Eyes:     Conjunctiva/sclera: Conjunctivae normal.     Pupils: Pupils are equal, round, and reactive to light.  Cardiovascular:     Rate and Rhythm: Normal rate and regular rhythm.     Heart sounds: Normal heart sounds.  Pulmonary:     Effort: Pulmonary effort is normal.     Breath sounds: Normal breath sounds.  Abdominal:     General: Bowel sounds are normal.     Palpations: Abdomen is soft.  Musculoskeletal:     Cervical back: Normal range of motion and neck supple.  Skin:     General: Skin is warm and dry.     Capillary Refill: Capillary refill takes less than 2 seconds.  Neurological:     Mental Status: He is alert and oriented to person, place, and time.  Psychiatric:        Behavior: Behavior normal.     Musculoskeletal Exam: C-spine was in good range of motion.  He has thoracic kyphosis.  He had discomfort range of motion of his lumbar spine.  Shoulder joints, elbow joints, wrist joints with good range of motion.  He has hypermobility in multiple joints.  Hip joints, knee joints with good range of motion.  No warmth swelling or effusion was noted.  There was no tenderness over ankles or MTPs.  CDAI Exam: CDAI Score: -- Patient Global: --; Provider Global: -- Swollen: --; Tender: -- Joint Exam 11/28/2020   No joint exam has been documented for this visit  There is currently no information documented on the homunculus. Go to the Rheumatology activity and complete the homunculus joint exam.  Investigation: No additional findings.  Imaging: No results found.  Recent Labs: Lab Results  Component Value Date   WBC 12.4 (H) 10/09/2020   HGB 14.3 10/09/2020   PLT 311 10/09/2020   NA 136 05/30/2020   K 3.7 05/30/2020   CL 103 05/30/2020   CO2 24 05/30/2020   GLUCOSE 91 05/30/2020   BUN 13 05/30/2020   CREATININE 1.47 (H) 05/30/2020   BILITOT 0.6 05/30/2020   ALKPHOS 150 (H) 05/30/2020   AST 18 05/30/2020   ALT 19 05/30/2020   PROT 7.5 05/30/2020   ALBUMIN 3.9 05/30/2020   CALCIUM 9.3 05/30/2020   GFRAA 50 (L) 04/11/2020   QFTBGOLDPLUS NEGATIVE 04/11/2020    Speciality Comments: No specialty comments available.  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  The report from the dermatologist was reviewed.  The biopsy came positive for associated dermatitis.  He was  advised to use of topical clobetasol cream twice daily.  He has had no recurrence of the rash.  He was also told that he had seborrheic keratosis and actinic keratosis.  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  There is no history of DVT or blood clots.  His repeat anticardiolipin and beta-2 IgM were positive as well.  He has been taking aspirin.  I discussed the option of adding hydroxychloroquine but he declined.  I gave him information on hydroxychloroquine to review.  Hypermobility of joint-he has hypermobility in multiple joints and also had laxity of the skin.  Increased joint pain with hypermobility and increased risk of arthritis with hypermobility was discussed.  DDD (degenerative disc disease), lumbar - Status postlaminectomy April 2021 by Dr. Marcello Moores. s/p fusion 04/2020.  He continues to have lower back pain and left-sided radiculopathy.  He states gabapentin is helpful.  Chronic pain of left knee - History of left knee joint pain due to limping.  He limps due to chronic lower back pain.  Chronic pain syndrome - Treated with Percocet for lower back pain.  Essential hypertension-his blood pressure is a still elevated.  Advised him to monitor his blood pressure closely and take the readings to his nephrologist.  Hypercholesteremia  Stage 3a chronic kidney disease ([place]) - Diagnosed 2018, followed by Dr. Theador Hawthorne. On Losartan . GFR is improving.  History of COPD  Tobacco use disorder - 1 pack/day for 20 years.  He quit smoking in 2013.  He is vaping currently.  Fatty liver  Anxiety and depression  Charcot Marie Tooth muscular atrophy  MGUS (monoclonal gammopathy of unknown significance) - Followed by oncology.  Neuropathy  Vitamin D deficiency  Basal cell carcinoma (BCC) of skin of right ear - he had no surgery June 2022.  Secondary hyperparathyroidism ([place])  BMI 30.0-30.9,adult  Orders: No orders of the defined  types were placed in this encounter.  No orders of the defined types were placed in this encounter.   Face-to-face time spent with patient was 30 minutes. Greater than 50% of time was spent in counseling and coordination of care.  Follow-Up Instructions: Return in about 6 months (around 05/29/2021) for Osteoarthritis, +ACL.   Bo Merino, MD  Note - This record has been created using Editor, commissioning.  Chart creation errors have been sought, but may not always  have been located. Such creation errors do not reflect on  the standard of medical care.

## 2020-11-28 ENCOUNTER — Ambulatory Visit: Payer: Medicare HMO | Admitting: Rheumatology

## 2020-11-28 ENCOUNTER — Encounter: Payer: Self-pay | Admitting: Rheumatology

## 2020-11-28 ENCOUNTER — Other Ambulatory Visit: Payer: Self-pay

## 2020-11-28 VITALS — BP 157/103 | HR 92 | Resp 12 | Ht 72.0 in | Wt 220.0 lb

## 2020-11-28 DIAGNOSIS — E559 Vitamin D deficiency, unspecified: Secondary | ICD-10-CM

## 2020-11-28 DIAGNOSIS — G894 Chronic pain syndrome: Secondary | ICD-10-CM

## 2020-11-28 DIAGNOSIS — N1831 Chronic kidney disease, stage 3a: Secondary | ICD-10-CM

## 2020-11-28 DIAGNOSIS — M25562 Pain in left knee: Secondary | ICD-10-CM

## 2020-11-28 DIAGNOSIS — F17200 Nicotine dependence, unspecified, uncomplicated: Secondary | ICD-10-CM

## 2020-11-28 DIAGNOSIS — F32A Depression, unspecified: Secondary | ICD-10-CM

## 2020-11-28 DIAGNOSIS — Z8709 Personal history of other diseases of the respiratory system: Secondary | ICD-10-CM

## 2020-11-28 DIAGNOSIS — E7800 Pure hypercholesterolemia, unspecified: Secondary | ICD-10-CM

## 2020-11-28 DIAGNOSIS — M5136 Other intervertebral disc degeneration, lumbar region: Secondary | ICD-10-CM

## 2020-11-28 DIAGNOSIS — Z6830 Body mass index (BMI) 30.0-30.9, adult: Secondary | ICD-10-CM

## 2020-11-28 DIAGNOSIS — C44212 Basal cell carcinoma of skin of right ear and external auricular canal: Secondary | ICD-10-CM

## 2020-11-28 DIAGNOSIS — R21 Rash and other nonspecific skin eruption: Secondary | ICD-10-CM

## 2020-11-28 DIAGNOSIS — M249 Joint derangement, unspecified: Secondary | ICD-10-CM | POA: Diagnosis not present

## 2020-11-28 DIAGNOSIS — F419 Anxiety disorder, unspecified: Secondary | ICD-10-CM

## 2020-11-28 DIAGNOSIS — K760 Fatty (change of) liver, not elsewhere classified: Secondary | ICD-10-CM

## 2020-11-28 DIAGNOSIS — D472 Monoclonal gammopathy: Secondary | ICD-10-CM

## 2020-11-28 DIAGNOSIS — G600 Hereditary motor and sensory neuropathy: Secondary | ICD-10-CM

## 2020-11-28 DIAGNOSIS — I10 Essential (primary) hypertension: Secondary | ICD-10-CM

## 2020-11-28 DIAGNOSIS — G8929 Other chronic pain: Secondary | ICD-10-CM

## 2020-11-28 DIAGNOSIS — R760 Raised antibody titer: Secondary | ICD-10-CM | POA: Diagnosis not present

## 2020-11-28 DIAGNOSIS — N2581 Secondary hyperparathyroidism of renal origin: Secondary | ICD-10-CM

## 2020-11-28 DIAGNOSIS — G629 Polyneuropathy, unspecified: Secondary | ICD-10-CM

## 2020-11-28 DIAGNOSIS — L308 Other specified dermatitis: Secondary | ICD-10-CM | POA: Diagnosis not present

## 2020-11-28 NOTE — Patient Instructions (Signed)
Hydroxychloroquine Tablets What is this medication? HYDROXYCHLOROQUINE (hye drox ee KLOR oh kwin) treats autoimmune conditions, such as rheumatoid arthritis and lupus. It works by slowing down an overactive immune system. It may also be used to prevent and treat malaria. It works by killing the parasite that causes malaria. It belongs to a group of medications called DMARDs. This medicine may be used for other purposes; ask your health care provider or pharmacist if you have questions. COMMON BRAND NAME(S): Plaquenil, Quineprox What should I tell my care team before I take this medication? They need to know if you have any of these conditions: Diabetes Eye disease, vision problems G6PD deficiency Heart disease History of irregular heartbeat If you often drink alcohol Kidney disease Liver disease Porphyria Psoriasis An unusual or allergic reaction to chloroquine, hydroxychloroquine, other medications, foods, dyes, or preservatives Pregnant or trying to get pregnant Breast-feeding How should I use this medication? Take this medication by mouth with a glass of water. Take it as directed on the prescription label. Do not cut, crush or chew this medication. Swallow the tablets whole. Take it with food. Do not take it more than directed. Take all of this medication unless your care team tells you to stop it early. Keep taking it even if you think you are better. Take products with antacids in them at a different time of day than this medication. Take this medication 4 hours before or 4 hours after antacids. Talk to your care team if you have questions. Talk to your care team about the use of this medication in children. While this medication may be prescribed for selected conditions, precautions do apply. Overdosage: If you think you have taken too much of this medicine contact a poison control center or emergency room at once. NOTE: This medicine is only for you. Do not share this medicine with  others. What if I miss a dose? If you miss a dose, take it as soon as you can. If it is almost time for your next dose, take only that dose. Do not take double or extra doses. What may interact with this medication? Do not take this medication with any of the following: Cisapride Dronedarone Pimozide Thioridazine This medication may also interact with the following: Ampicillin Antacids Cimetidine Cyclosporine Digoxin Kaolin Medications for diabetes, like insulin, glipizide, glyburide Medications for seizures like carbamazepine, phenobarbital, phenytoin Mefloquine Methotrexate Other medications that prolong the QT interval (cause an abnormal heart rhythm) Praziquantel This list may not describe all possible interactions. Give your health care provider a list of all the medicines, herbs, non-prescription drugs, or dietary supplements you use. Also tell them if you smoke, drink alcohol, or use illegal drugs. Some items may interact with your medicine. What should I watch for while using this medication? Visit your care team for regular checks on your progress. Tell your care team if your symptoms do not start to get better or if they get worse. You may need blood work done while you are taking this medication. If you take other medications that can affect heart rhythm, you may need more testing. Talk to your care team if you have questions. Your vision may be tested before and during use of this medication. Tell your care team right away if you have any change in your eyesight. This medication may cause serious skin reactions. They can happen weeks to months after starting the medication. Contact your care team right away if you notice fevers or flu-like symptoms with a rash. The  rash may be red or purple and then turn into blisters or peeling of the skin. Or, you might notice a red rash with swelling of the face, lips or lymph nodes in your neck or under your arms. If you or your family  notice any changes in your behavior, such as new or worsening depression, thoughts of harming yourself, anxiety, or other unusual or disturbing thoughts, or memory loss, call your care team right away. What side effects may I notice from receiving this medication? Side effects that you should report to your care team as soon as possible: Allergic reactions-skin rash, itching, hives, swelling of the face, lips, tongue, or throat Aplastic anemia-unusual weakness or fatigue, dizziness, headache, trouble breathing, increased bleeding or bruising Change in vision Heart rhythm changes-fast or irregular heartbeat, dizziness, feeling faint or lightheaded, chest pain, trouble breathing Infection-fever, chills, cough, or sore throat Low blood sugar (hypoglycemia)-tremors or shaking, anxiety, sweating, cold or clammy skin, confusion, dizziness, rapid heartbeat Muscle injury-unusual weakness or fatigue, muscle pain, dark yellow or brown urine, decrease in amount of urine Pain, tingling, or numbness in the hands or feet Rash, fever, and swollen lymph nodes Redness, blistering, peeling, or loosening of the skin, including inside the mouth Thoughts of suicide or self-harm, worsening mood, or feelings of depression Unusual bruising or bleeding Side effects that usually do not require medical attention (report to your care team if they continue or are bothersome): Diarrhea Headache Nausea Stomach pain Vomiting This list may not describe all possible side effects. Call your doctor for medical advice about side effects. You may report side effects to FDA at 1-800-FDA-1088. Where should I keep my medication? Keep out of the reach of children and pets. Store at room temperature up to 30 degrees C (86 degrees F). Protect from light. Get rid of any unused medication after the expiration date. To get rid of medications that are no longer needed or have expired: Take the medication to a medication take-back  program. Check with your pharmacy or law enforcement to find a location. If you cannot return the medication, check the label or package insert to see if the medication should be thrown out in the garbage or flushed down the toilet. If you are not sure, ask your care team. If it is safe to put it in the trash, empty the medication out of the container. Mix the medication with cat litter, dirt, coffee grounds, or other unwanted substance. Seal the mixture in a bag or container. Put it in the trash. NOTE: This sheet is a summary. It may not cover all possible information. If you have questions about this medicine, talk to your doctor, pharmacist, or health care provider.  2022 Elsevier/Gold Standard (2020-05-08 10:19:21)

## 2020-12-07 DIAGNOSIS — L308 Other specified dermatitis: Secondary | ICD-10-CM | POA: Diagnosis not present

## 2020-12-07 DIAGNOSIS — L570 Actinic keratosis: Secondary | ICD-10-CM | POA: Diagnosis not present

## 2020-12-07 DIAGNOSIS — L089 Local infection of the skin and subcutaneous tissue, unspecified: Secondary | ICD-10-CM | POA: Diagnosis not present

## 2020-12-07 DIAGNOSIS — L0101 Non-bullous impetigo: Secondary | ICD-10-CM | POA: Diagnosis not present

## 2020-12-10 DIAGNOSIS — R079 Chest pain, unspecified: Secondary | ICD-10-CM | POA: Diagnosis not present

## 2020-12-10 DIAGNOSIS — N529 Male erectile dysfunction, unspecified: Secondary | ICD-10-CM | POA: Diagnosis not present

## 2021-02-08 DIAGNOSIS — N183 Chronic kidney disease, stage 3 unspecified: Secondary | ICD-10-CM | POA: Diagnosis not present

## 2021-02-08 DIAGNOSIS — I10 Essential (primary) hypertension: Secondary | ICD-10-CM | POA: Diagnosis not present

## 2021-02-13 ENCOUNTER — Ambulatory Visit: Payer: Medicare HMO | Admitting: Orthopaedic Surgery

## 2021-02-18 DIAGNOSIS — E211 Secondary hyperparathyroidism, not elsewhere classified: Secondary | ICD-10-CM | POA: Diagnosis not present

## 2021-02-18 DIAGNOSIS — R809 Proteinuria, unspecified: Secondary | ICD-10-CM | POA: Diagnosis not present

## 2021-02-18 DIAGNOSIS — R768 Other specified abnormal immunological findings in serum: Secondary | ICD-10-CM | POA: Diagnosis not present

## 2021-02-18 DIAGNOSIS — E872 Acidosis, unspecified: Secondary | ICD-10-CM | POA: Diagnosis not present

## 2021-02-18 DIAGNOSIS — N182 Chronic kidney disease, stage 2 (mild): Secondary | ICD-10-CM | POA: Diagnosis not present

## 2021-02-18 DIAGNOSIS — I129 Hypertensive chronic kidney disease with stage 1 through stage 4 chronic kidney disease, or unspecified chronic kidney disease: Secondary | ICD-10-CM | POA: Diagnosis not present

## 2021-03-04 DIAGNOSIS — I10 Essential (primary) hypertension: Secondary | ICD-10-CM | POA: Diagnosis not present

## 2021-03-04 DIAGNOSIS — M5416 Radiculopathy, lumbar region: Secondary | ICD-10-CM | POA: Diagnosis not present

## 2021-03-06 ENCOUNTER — Other Ambulatory Visit (HOSPITAL_COMMUNITY): Payer: Self-pay | Admitting: Neurosurgery

## 2021-03-06 ENCOUNTER — Other Ambulatory Visit: Payer: Self-pay | Admitting: Neurosurgery

## 2021-03-06 DIAGNOSIS — M5416 Radiculopathy, lumbar region: Secondary | ICD-10-CM

## 2021-03-19 ENCOUNTER — Other Ambulatory Visit: Payer: Self-pay

## 2021-03-19 ENCOUNTER — Ambulatory Visit (HOSPITAL_COMMUNITY)
Admission: RE | Admit: 2021-03-19 | Discharge: 2021-03-19 | Disposition: A | Discharge status: Home / Self Care | Payer: Medicare Other | Source: Ambulatory Visit | Attending: Neurosurgery | Admitting: Neurosurgery

## 2021-03-19 DIAGNOSIS — M48061 Spinal stenosis, lumbar region without neurogenic claudication: Secondary | ICD-10-CM | POA: Diagnosis not present

## 2021-03-19 DIAGNOSIS — M2578 Osteophyte, vertebrae: Secondary | ICD-10-CM | POA: Diagnosis not present

## 2021-03-19 DIAGNOSIS — M5416 Radiculopathy, lumbar region: Secondary | ICD-10-CM | POA: Diagnosis not present

## 2021-03-19 DIAGNOSIS — M5126 Other intervertebral disc displacement, lumbar region: Secondary | ICD-10-CM | POA: Diagnosis not present

## 2021-03-19 MED ORDER — GADOXETATE DISODIUM 0.25 MMOL/ML IV SOLN: 10.0000 mL | Freq: Once | INTRAVENOUS | Status: DC

## 2021-03-19 MED ORDER — GADOBUTROL 1 MMOL/ML IV SOLN
10.0000 mL | Freq: Once | INTRAVENOUS | Status: AC | PRN
  Administered 2021-03-19: 10 mL via INTRAVENOUS

## 2021-03-22 DIAGNOSIS — M5416 Radiculopathy, lumbar region: Secondary | ICD-10-CM | POA: Diagnosis not present

## 2021-04-03 ENCOUNTER — Other Ambulatory Visit: Payer: Self-pay | Admitting: Neurosurgery

## 2021-04-03 ENCOUNTER — Other Ambulatory Visit (HOSPITAL_COMMUNITY): Payer: Self-pay | Admitting: Neurosurgery

## 2021-04-03 DIAGNOSIS — M5416 Radiculopathy, lumbar region: Secondary | ICD-10-CM

## 2021-04-11 ENCOUNTER — Inpatient Hospital Stay (HOSPITAL_COMMUNITY): Payer: Medicare Other | Attending: Hematology

## 2021-04-11 DIAGNOSIS — D472 Monoclonal gammopathy: Secondary | ICD-10-CM | POA: Diagnosis not present

## 2021-04-11 LAB — CBC WITH DIFFERENTIAL/PLATELET
Abs Immature Granulocytes: 0.03 10*3/uL (ref 0.00–0.07)
Basophils Absolute: 0.1 10*3/uL (ref 0.0–0.1)
Basophils Relative: 1 %
Eosinophils Absolute: 0.2 10*3/uL (ref 0.0–0.5)
Eosinophils Relative: 1 %
HCT: 43.2 % (ref 39.0–52.0)
Hemoglobin: 15.2 g/dL (ref 13.0–17.0)
Immature Granulocytes: 0 %
Lymphocytes Relative: 18 %
Lymphs Abs: 2.3 10*3/uL (ref 0.7–4.0)
MCH: 32.3 pg (ref 26.0–34.0)
MCHC: 35.2 g/dL (ref 30.0–36.0)
MCV: 91.7 fL (ref 80.0–100.0)
Monocytes Absolute: 0.7 10*3/uL (ref 0.1–1.0)
Monocytes Relative: 5 %
Neutro Abs: 9.9 10*3/uL — ABNORMAL HIGH (ref 1.7–7.7)
Neutrophils Relative %: 75 %
Platelets: 312 10*3/uL (ref 150–400)
RBC: 4.71 MIL/uL (ref 4.22–5.81)
RDW: 12.3 % (ref 11.5–15.5)
WBC: 13.3 10*3/uL — ABNORMAL HIGH (ref 4.0–10.5)
nRBC: 0 % (ref 0.0–0.2)

## 2021-04-11 LAB — COMPREHENSIVE METABOLIC PANEL
ALT: 13 U/L (ref 0–44)
AST: 17 U/L (ref 15–41)
Albumin: 3.9 g/dL (ref 3.5–5.0)
Alkaline Phosphatase: 118 U/L (ref 38–126)
Anion gap: 11 (ref 5–15)
BUN: 12 mg/dL (ref 8–23)
CO2: 24 mmol/L (ref 22–32)
Calcium: 9.5 mg/dL (ref 8.9–10.3)
Chloride: 104 mmol/L (ref 98–111)
Creatinine, Ser: 1.39 mg/dL — ABNORMAL HIGH (ref 0.61–1.24)
GFR, Estimated: 56 mL/min — ABNORMAL LOW (ref >60)
Glucose, Bld: 111 mg/dL — ABNORMAL HIGH (ref 70–99)
Potassium: 3.1 mmol/L — ABNORMAL LOW (ref 3.5–5.1)
Sodium: 139 mmol/L (ref 135–145)
Total Bilirubin: 0.5 mg/dL (ref 0.3–1.2)
Total Protein: 7.5 g/dL (ref 6.5–8.1)

## 2021-04-11 LAB — LACTATE DEHYDROGENASE: LDH: 162 U/L (ref 98–192)

## 2021-04-12 ENCOUNTER — Inpatient Hospital Stay (HOSPITAL_COMMUNITY): Payer: Medicare Other

## 2021-04-12 ENCOUNTER — Encounter (HOSPITAL_COMMUNITY): Payer: Self-pay

## 2021-04-12 LAB — KAPPA/LAMBDA LIGHT CHAINS
Kappa free light chain: 37.2 mg/L — ABNORMAL HIGH (ref 3.3–19.4)
Kappa, lambda light chain ratio: 1.28 (ref 0.26–1.65)
Lambda free light chains: 29 mg/L — ABNORMAL HIGH (ref 5.7–26.3)

## 2021-04-15 LAB — PROTEIN ELECTROPHORESIS, SERUM
A/G Ratio: 1.1 (ref 0.7–1.7)
Albumin ELP: 3.4 g/dL (ref 2.9–4.4)
Alpha-1-Globulin: 0.3 g/dL (ref 0.0–0.4)
Alpha-2-Globulin: 0.8 g/dL (ref 0.4–1.0)
Beta Globulin: 1.1 g/dL (ref 0.7–1.3)
Gamma Globulin: 1.1 g/dL (ref 0.4–1.8)
Globulin, Total: 3.2 g/dL (ref 2.2–3.9)
M-Spike, %: 0.2 g/dL — ABNORMAL HIGH
Total Protein ELP: 6.6 g/dL (ref 6.0–8.5)

## 2021-04-15 LAB — IMMUNOFIXATION ELECTROPHORESIS
IgA: 275 mg/dL (ref 61–437)
IgG (Immunoglobin G), Serum: 1043 mg/dL (ref 603–1613)
IgM (Immunoglobulin M), Srm: 89 mg/dL (ref 20–172)
Total Protein ELP: 6.7 g/dL (ref 6.0–8.5)

## 2021-04-18 NOTE — Progress Notes (Deleted)
NO SHOW

## 2021-04-19 ENCOUNTER — Inpatient Hospital Stay (HOSPITAL_COMMUNITY): Payer: Medicare Other | Admitting: Physician Assistant

## 2021-04-19 ENCOUNTER — Encounter (HOSPITAL_COMMUNITY): Payer: Self-pay

## 2021-04-24 ENCOUNTER — Ambulatory Visit (HOSPITAL_COMMUNITY): Payer: Medicare Other

## 2021-04-26 DIAGNOSIS — M5416 Radiculopathy, lumbar region: Secondary | ICD-10-CM | POA: Diagnosis not present

## 2021-05-15 NOTE — Progress Notes (Deleted)
Office Visit Note  Patient: Juan Yang             Date of Birth: [birth year]           MRN: [id number]             PCP: Monico Blitz, MD Referring: Monico Blitz, MD Visit Date: 05/29/2021 Occupation: @GUAROCC @  Subjective:  No chief complaint on file.   History of Present Illness: Juan Yang is a 66 y.o. male ***   Activities of Daily Living:  Patient reports morning stiffness for *** {minute/hour:19697}.   Patient {ACTIONS;DENIES/REPORTS:21021675::"Denies"} nocturnal pain.  Difficulty dressing/grooming: {ACTIONS;DENIES/REPORTS:21021675::"Denies"} Difficulty climbing stairs: {ACTIONS;DENIES/REPORTS:21021675::"Denies"} Difficulty getting out of chair: {ACTIONS;DENIES/REPORTS:21021675::"Denies"} Difficulty using hands for taps, buttons, cutlery, and/or writing: {ACTIONS;DENIES/REPORTS:21021675::"Denies"}  No Rheumatology ROS completed.   PMFS History:  Patient Active Problem List   Diagnosis Date Noted   Secondary hyperparathyroidism ([place]) 07/22/2020   Vitamin D deficiency 07/22/2020   Anxiety and depression 07/22/2020   MGUS (monoclonal gammopathy of unknown significance) 07/22/2020   History of COPD 07/22/2020   Tobacco use disorder 07/22/2020   Fatty liver 07/22/2020   Essential hypertension 07/22/2020   DDD (degenerative disc disease), lumbar 07/22/2020   Hypercholesteremia 06/30/2020   Stage 3a chronic kidney disease ([place]) 06/30/2020   Basal cell carcinoma 06/07/2020   Lumbar radiculopathy 05/02/2020   Guaiac positive stools 10/30/2016   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   Peripheral neuropathy 10/29/2016    Past Medical History:  Diagnosis Date   AAA (abdominal aortic aneurysm)    3.2 cm 02/01/18 CT, recommended [place] in 3 years   Anxiety    Arthritis    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   Charcot-Marie-Tooth disease type 2    CKD (chronic kidney disease)    Dyspnea     Guaiac + stool 10/29/2016   History of kidney stones    Hypertension    Peripheral neuropathy 10/29/2016    Family History  Problem Relation Age of Onset   Charcot-Marie-Tooth disease Mother    Heart disease Father    COPD Father    Arthritis Sister    Past Surgical History:  Procedure Laterality Date   COLONOSCOPY WITH PROPOFOL N/A 11/14/2016   Procedure: COLONOSCOPY WITH PROPOFOL;  Surgeon: Rogene Houston, MD;  [facility];  Service: Endoscopy;  Laterality: N/A;  9:15   LUMBAR LAMINECTOMY/DECOMPRESSION MICRODISCECTOMY Right 05/24/2019   Procedure: LAMINECTOMY AND FORAMINOTOMY LUMBAR FIVE- SACRAL ONE RIGHT;  Surgeon: Vallarie Mare, MD;  Location: [facility];  Service: Neurosurgery;  Laterality: Right;  posterior/right   MOHS SURGERY Right 05/2020   ear   MOHS SURGERY     POLYPECTOMY  11/14/2016   Procedure: POLYPECTOMY- SIGMOID COLON X3 TRANSVERSE COLON X2;  Surgeon: Rogene Houston, MD;  [facility];  Service: Endoscopy;;   rt ankle surgery     Titanium hinge   TONSILLECTOMY     TRANSFORAMINAL LUMBAR INTERBODY FUSION (TLIF) WITH PEDICLE SCREW FIXATION 1 LEVEL N/A 05/02/2020   Procedure: Lumbar five Sacral one Transforaminal lumbar interbody fusion;  Surgeon: Vallarie Mare, MD;  Location: [facility];  Service: Neurosurgery;  Laterality: N/A;   Social History   Social History Narrative   Right handed   Lives with wife in one story home    There is no immunization history on file for this patient.   Objective: Vital Signs: There were no vitals  taken for this visit.   Physical Exam   Musculoskeletal Exam: ***  CDAI Exam: CDAI Score: -- Patient Global: --; Provider Global: -- Swollen: --; Tender: -- Joint Exam 05/29/2021   No joint exam has been documented for this visit   There is currently no information documented on the homunculus. Go to the Rheumatology activity and complete the homunculus joint exam.  Investigation: No  additional findings.  Imaging: No results found.  Recent Labs: Lab Results  Component Value Date   WBC 13.3 (H) 04/11/2021   HGB 15.2 04/11/2021   PLT 312 04/11/2021   NA 139 04/11/2021   K 3.1 (L) 04/11/2021   CL 104 04/11/2021   CO2 24 04/11/2021   GLUCOSE 111 (H) 04/11/2021   BUN 12 04/11/2021   CREATININE 1.39 (H) 04/11/2021   BILITOT 0.5 04/11/2021   ALKPHOS 118 04/11/2021   AST 17 04/11/2021   ALT 13 04/11/2021   PROT 7.5 04/11/2021   ALBUMIN 3.9 04/11/2021   CALCIUM 9.5 04/11/2021   GFRAA 50 (L) 04/11/2020   QFTBGOLDPLUS NEGATIVE 04/11/2020    Speciality Comments: No specialty comments available.  Procedures:  No procedures performed Allergies: Other, Atenolol, Cymbalta [duloxetine hcl], Latex, and Penicillins   Assessment / Plan:     Visit Diagnoses: No diagnosis found.  Orders: No orders of the defined types were placed in this encounter.  No orders of the defined types were placed in this encounter.   Face-to-face time spent with patient was *** minutes. Greater than 50% of time was spent in counseling and coordination of care.  Follow-Up Instructions: No follow-ups on file.   Earnestine Mealing, CMA  Note - This record has been created using Editor, commissioning.  Chart creation errors have been sought, but may not always  have been located. Such creation errors do not reflect on  the standard of medical care.

## 2021-05-16 ENCOUNTER — Ambulatory Visit (HOSPITAL_COMMUNITY)
Admission: RE | Admit: 2021-05-16 | Discharge: 2021-05-16 | Disposition: A | Discharge status: Home / Self Care | Payer: Medicare Other | Source: Ambulatory Visit | Attending: Neurosurgery | Admitting: Neurosurgery

## 2021-05-16 DIAGNOSIS — M5416 Radiculopathy, lumbar region: Secondary | ICD-10-CM | POA: Insufficient documentation

## 2021-05-16 DIAGNOSIS — M47816 Spondylosis without myelopathy or radiculopathy, lumbar region: Secondary | ICD-10-CM | POA: Diagnosis not present

## 2021-05-21 NOTE — Progress Notes (Signed)
? ?Virtual Visit via Telephone Note ?[place] ? ?I connected with Juan Yang  on 05/22/21 at 11:13 AM by telephone and verified that I am speaking with the correct person using two identifiers. ? ?Location: ?Patient: Home ?Provider: [facility] ?  ?I discussed the limitations, risks, security and privacy concerns of performing an evaluation and management service by telephone and the availability of in person appointments. I also discussed with the patient that there may be a patient responsible charge related to this service. The patient expressed understanding and agreed to proceed. ? ? ?REASON FOR VISIT:  ?Follow-up for MGUS, leukocytosis, and abnormal coagulopathy labs ? ?PRIOR THERAPY: None ? ?CURRENT THERAPY: Observation ? ?INTERVAL HISTORY:  ?Juan Yang 66 y.o. male returns for routine follow-up of his MGUS, leukocytosis, and abnormal coagulopathy labs.  He was last seen by Tarri Abernethy PA-C on 10/16/2020. ? ?At today's visit, he reports feeling a bit "under the weather" due to "stomach bug."  No recent hospitalizations, surgeries, or changes in baseline health status. ? 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  He believes that his symptoms are slowly getting worse.  He continues to follow-up with rheumatology and neurosurgery. ? ?He denies any new bone pain or recent fractures. ?Ringing in your ears okay he denies any B symptoms such as fever, chills, night sweats, unintentional weight loss.    ?No new neurologic symptoms such as tinnitus, new-onset hearing loss, blurred vision, headache, or dizziness.  He has chronic neuropathy and ataxia as noted above. ?No thromboembolic events since his last visit.  ?No new masses or lymphadenopathy per his report. ? ?He has 50% energy and 40% appetite. He endorses that he is maintaining a stable weight. ? ?   ?OBSERVATIONS/OBJECTIVE: ?Review of Systems  ?Constitutional:  Positive for malaise/fatigue. Negative for chills, diaphoresis, fever and weight loss.  ?Respiratory:  Negative for cough and shortness of breath.   ?Cardiovascular:  Negative for chest pain and palpitations.  ?Gastrointestinal:  Positive for nausea. Negative for abdominal pain, blood in stool, melena and vomiting.  ?Genitourinary:  Positive for urgency.  ?Musculoskeletal:  Positive for back pain and joint pain.  ?Neurological:  Positive for dizziness and weakness. Negative for headaches.  ?Psychiatric/Behavioral:  The patient is nervous/anxious and has insomnia.    ? ?PHYSICAL EXAM (per limitations of virtual telephone visit): The patient is alert and oriented x 3, exhibiting adequate mentation, good mood, and ability to speak in full sentences and execute sound judgement. ? ? ?ASSESSMENT & PLAN: ?1.  Monoclonal gammopathy of undetermined significance ?- Work-up by rheumatology (April 2022) showed faint monoclonal gammopathy: SPEP with a poorly-defined band of restricted protein mobility in the gammaglobulin region, that may represent a monoclonal protein.  IFE detected faint monoclonal free lambda light chain. ?- MGUS/myeloma panel completed on 05/30/20 by hematology clinic was unremarkable with negative SPEP and negative immunofixation ?- Skeletal survey (05/30/2020) no lytic lesion, prior sclerotic lesion of right tibia once again noted ?- Repeat MGUS/myeloma panel (04/11/2021): SPEP positive for M spike 0.2, immunofixation with IgG lambda monoclonal protein.  Elevated kappa light chains 37.2, elevated lambda 29.0, with normal ratio 1.28.  LDH normal at 162.  No CRAB features - Hgb 15.2, stable creatinine 1.39, calcium 9.5. 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 ?- We will repeat MGUS/myeloma panel in 6 months along with skeletal survey and 24-hour urine. ? ?2.  Positive for IgA anticardiolipin antibody and IgA antibeta-2 glycoprotein 1 antibody ?- Initial panel drawn on 05/11/2020:  IgA anticardiolipin antibody (60.2) and IgA antibeta-2 glycoprotein 1 antibody (> 65.0) ?- Repeat panel drawn on 10/09/2020: IgA anticardiolipin antibodies with normal (< 9), but IgM anticardiolipin antibody is now mildly elevated (19); similarly, IgA anticipated to glycoprotein 1 antibody is now normal (16), but IgM beta-2 glycoprotein 1 antibody is positive (88). ?- Patient denies any previous history of clotting with DVT or PE.  He does have a history of questionable TIAs x 3 ?-There is no family history of blood clots, although his mother did have 1 miscarriage. ?- Due to inconsistent elevation in antibodies, do not suspect any true coagulopathy in this patient.   ?- PLAN:  No indication for further intervention or work-up at this time. ?  ?3.   Leukocytosis ?- Patient has had persistent leukocytosis since August 2021, ranging form 11.4 - 12.4; intermittently elevated neutrophils, eosinophils, and basophils ?- Likely reactive related to rheumatologic process (ANA positive, undergoing rheumatology work-up for possible vasculitis) ?- May also be reactive related to vaping  ?- No B symptoms apart from fatigue  ?- Most recent CBC (04/11/2021) showed WBC 13.3 with elevated ANC 9.9 ?- PLAN:  Likely reactive, but we will check BCR/ABL FISH and JAK2 w/ reflex at next lab draw in 6 months to rule out MPN. ? ? ?I discussed the assessment and treatment plan with the patient. The patient was provided an opportunity to ask questions and all were answered. The patient agreed with the plan and demonstrated an understanding of the instructions. ?  ?The patient was advised  to call back or seek an in-person evaluation if the symptoms worsen or if the condition fails to improve as anticipated. ? ?I provided 23 minutes of non-face-to-face time during this encounter. ? ? ?Harriett Rush, PA-C ?05/22/21 11:39 AM  ?

## 2021-05-22 ENCOUNTER — Inpatient Hospital Stay (HOSPITAL_COMMUNITY): Payer: Medicare Other | Attending: Hematology | Admitting: Physician Assistant

## 2021-05-22 DIAGNOSIS — D472 Monoclonal gammopathy: Secondary | ICD-10-CM

## 2021-05-22 DIAGNOSIS — D72829 Elevated white blood cell count, unspecified: Secondary | ICD-10-CM

## 2021-05-29 ENCOUNTER — Ambulatory Visit: Payer: Medicare Other | Admitting: Rheumatology

## 2021-05-29 DIAGNOSIS — G629 Polyneuropathy, unspecified: Secondary | ICD-10-CM

## 2021-05-29 DIAGNOSIS — M5136 Other intervertebral disc degeneration, lumbar region: Secondary | ICD-10-CM

## 2021-05-29 DIAGNOSIS — Z8709 Personal history of other diseases of the respiratory system: Secondary | ICD-10-CM

## 2021-05-29 DIAGNOSIS — N2581 Secondary hyperparathyroidism of renal origin: Secondary | ICD-10-CM

## 2021-05-29 DIAGNOSIS — E559 Vitamin D deficiency, unspecified: Secondary | ICD-10-CM

## 2021-05-29 DIAGNOSIS — C44212 Basal cell carcinoma of skin of right ear and external auricular canal: Secondary | ICD-10-CM

## 2021-05-29 DIAGNOSIS — F17200 Nicotine dependence, unspecified, uncomplicated: Secondary | ICD-10-CM

## 2021-05-29 DIAGNOSIS — D472 Monoclonal gammopathy: Secondary | ICD-10-CM

## 2021-05-29 DIAGNOSIS — I10 Essential (primary) hypertension: Secondary | ICD-10-CM

## 2021-05-29 DIAGNOSIS — F32A Depression, unspecified: Secondary | ICD-10-CM

## 2021-05-29 DIAGNOSIS — M249 Joint derangement, unspecified: Secondary | ICD-10-CM

## 2021-05-29 DIAGNOSIS — G8929 Other chronic pain: Secondary | ICD-10-CM

## 2021-05-29 DIAGNOSIS — R760 Raised antibody titer: Secondary | ICD-10-CM

## 2021-05-29 DIAGNOSIS — E7800 Pure hypercholesterolemia, unspecified: Secondary | ICD-10-CM

## 2021-05-29 DIAGNOSIS — G894 Chronic pain syndrome: Secondary | ICD-10-CM

## 2021-05-29 DIAGNOSIS — G600 Hereditary motor and sensory neuropathy: Secondary | ICD-10-CM

## 2021-05-29 DIAGNOSIS — N1831 Chronic kidney disease, stage 3a: Secondary | ICD-10-CM

## 2021-05-29 DIAGNOSIS — K760 Fatty (change of) liver, not elsewhere classified: Secondary | ICD-10-CM

## 2021-05-29 DIAGNOSIS — L308 Other specified dermatitis: Secondary | ICD-10-CM

## 2021-05-30 NOTE — Progress Notes (Deleted)
Office Visit Note  Patient: Juan Yang             Date of Birth: 11-04-1955           MRN: [id number]             PCP: Monico Blitz, MD Referring: Monico Blitz, MD Visit Date: 06/04/2021 Occupation: @GUAROCC @  Subjective:  No chief complaint on file.   History of Present Illness: Juan Yang is a 66 y.o. male ***   Activities of Daily Living:  Patient reports morning stiffness for *** {minute/hour:19697}.   Patient {ACTIONS;DENIES/REPORTS:21021675::"Denies"} nocturnal pain.  Difficulty dressing/grooming: {ACTIONS;DENIES/REPORTS:21021675::"Denies"} Difficulty climbing stairs: {ACTIONS;DENIES/REPORTS:21021675::"Denies"} Difficulty getting out of chair: {ACTIONS;DENIES/REPORTS:21021675::"Denies"} Difficulty using hands for taps, buttons, cutlery, and/or writing: {ACTIONS;DENIES/REPORTS:21021675::"Denies"}  No Rheumatology ROS completed.   PMFS History:  Patient Active Problem List   Diagnosis Date Noted   Secondary hyperparathyroidism ([place]) 07/22/2020   Vitamin D deficiency 07/22/2020   Anxiety and depression 07/22/2020   MGUS (monoclonal gammopathy of unknown significance) 07/22/2020   History of COPD 07/22/2020   Tobacco use disorder 07/22/2020   Fatty liver 07/22/2020   Essential hypertension 07/22/2020   DDD (degenerative disc disease), lumbar 07/22/2020   Hypercholesteremia 06/30/2020   Stage 3a chronic kidney disease ([place]) 06/30/2020   Basal cell carcinoma 06/07/2020   Lumbar radiculopathy 05/02/2020   Guaiac positive stools 10/30/2016   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   Peripheral neuropathy 10/29/2016    Past Medical History:  Diagnosis Date   AAA (abdominal aortic aneurysm)    3.2 cm 02/01/18 CT, recommended [place] in 3 years   Anxiety    Arthritis    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   Charcot-Marie-Tooth disease type 2    CKD (chronic kidney disease)    Dyspnea     Guaiac + stool 10/29/2016   History of kidney stones    Hypertension    Peripheral neuropathy 10/29/2016    Family History  Problem Relation Age of Onset   Charcot-Marie-Tooth disease Mother    Heart disease Father    COPD Father    Arthritis Sister    Past Surgical History:  Procedure Laterality Date   COLONOSCOPY WITH PROPOFOL N/A 11/14/2016   Procedure: COLONOSCOPY WITH PROPOFOL;  Surgeon: Rogene Houston, MD;  [facility];  Service: Endoscopy;  Laterality: N/A;  9:15   LUMBAR LAMINECTOMY/DECOMPRESSION MICRODISCECTOMY Right 05/24/2019   Procedure: LAMINECTOMY AND FORAMINOTOMY LUMBAR FIVE- SACRAL ONE RIGHT;  Surgeon: Vallarie Mare, MD;  Location: [place];  Service: Neurosurgery;  Laterality: Right;  posterior/right   MOHS SURGERY Right 05/2020   ear   MOHS SURGERY     POLYPECTOMY  11/14/2016   Procedure: POLYPECTOMY- SIGMOID COLON X3 TRANSVERSE COLON X2;  Surgeon: Rogene Houston, MD;  [facility];  Service: Endoscopy;;   rt ankle surgery     Titanium hinge   TONSILLECTOMY     TRANSFORAMINAL LUMBAR INTERBODY FUSION (TLIF) WITH PEDICLE SCREW FIXATION 1 LEVEL N/A 05/02/2020   Procedure: Lumbar five Sacral one Transforaminal lumbar interbody fusion;  Surgeon: Vallarie Mare, MD;  Location: [place];  Service: Neurosurgery;  Laterality: N/A;   Social History   Social History Narrative   Right handed   Lives with wife in one story home    There is no immunization history on file for this patient.   Objective: Vital Signs: There were no vitals  taken for this visit.   Physical Exam   Musculoskeletal Exam: ***  CDAI Exam: CDAI Score: -- Patient Global: --; Provider Global: -- Swollen: --; Tender: -- Joint Exam 06/04/2021   No joint exam has been documented for this visit   There is currently no information documented on the homunculus. Go to the Rheumatology activity and complete the homunculus joint exam.  Investigation: No  additional findings.  Imaging: CT LUMBAR SPINE WO CONTRAST  Result Date: 05/16/2021 CLINICAL DATA:  Chronic low back pain radiating down both legs, worse on the right. Multiple previous back surgeries. EXAM: CT LUMBAR SPINE WITHOUT CONTRAST TECHNIQUE: Multidetector CT imaging of the lumbar spine was performed without intravenous contrast administration. Multiplanar CT image reconstructions were also generated. RADIATION DOSE REDUCTION: This exam was performed according to the departmental dose-optimization program which includes automated exposure control, adjustment of the mA and/or kV according to patient size and/or use of iterative reconstruction technique. COMPARISON:  MRI lumbar spine 03/19/2021. Lumbar spine radiographs 03/04/2021 FINDINGS: Segmentation: 5 lumbar type vertebral bodies. Alignment: Normal alignment Vertebrae: No vertebral compression deformities. No focal bone lesion or bone destruction. The bone cortex appears intact. Postoperative changes with intervertebral disc prosthesis and posterior rod and screw fixation at L5-S1. Streak artifact limits visualization but surgical hardware appears intact. Paraspinal and other soft tissues: No abnormal paraspinal soft tissue mass or infiltration. Paraspinal musculature is symmetrical. Incidental note of a cyst in the upper pole of the right kidney measuring 3.9 cm diameter, unchanged. This is a likely benign finding and no imaging follow-up is indicated. Calcification of the aorta. Abdominal aortic aneurysm measuring 3.2 cm diameter. No change. Disc levels: Moderate central canal stenosis is suggested at L3-4 and L4-5 levels likely caused by combination of diffuse bulging disc annulus and posterior ligamentous/facet hypertrophy. Visualization at L5-S1 is limited due to streak artifact from the surgical hardware. IMPRESSION: 1. Normal alignment.  No acute displaced fractures identified. 2. Postoperative changes with posterior fixation and  intervertebral fusion at L5-S1. 3. Moderate degenerative changes suggesting likely moderate central canal stenosis at L3-4 and L4-5 levels. Similar appearance to previous MRI, allowing for technical differences. 4. 3.2 cm abdominal aortic aneurysm. Recommend follow-up every 3 years. Reference: J Am Coll Radiol 5277;82:423-536. Electronically Signed   By: Lucienne Capers M.D.   On: 05/16/2021 20:09    Recent Labs: Lab Results  Component Value Date   WBC 13.3 (H) 04/11/2021   HGB 15.2 04/11/2021   PLT 312 04/11/2021   NA 139 04/11/2021   K 3.1 (L) 04/11/2021   CL 104 04/11/2021   CO2 24 04/11/2021   GLUCOSE 111 (H) 04/11/2021   BUN 12 04/11/2021   CREATININE 1.39 (H) 04/11/2021   BILITOT 0.5 04/11/2021   ALKPHOS 118 04/11/2021   AST 17 04/11/2021   ALT 13 04/11/2021   PROT 7.5 04/11/2021   ALBUMIN 3.9 04/11/2021   CALCIUM 9.5 04/11/2021   GFRAA 50 (L) 04/11/2020   QFTBGOLDPLUS NEGATIVE 04/11/2020    Speciality Comments: No specialty comments available.  Procedures:  No procedures performed Allergies: Other, Atenolol, Cymbalta [duloxetine hcl], Latex, and Penicillins   Assessment / Plan:     Visit Diagnoses: No diagnosis found.  Orders: No orders of the defined types were placed in this encounter.  No orders of the defined types were placed in this encounter.   Face-to-face time spent with patient was *** minutes. Greater than 50% of time was spent in counseling and coordination of care.  Follow-Up Instructions: No follow-ups  on file.   Earnestine Mealing, CMA  Note - This record has been created using Editor, commissioning.  Chart creation errors have been sought, but may not always  have been located. Such creation errors do not reflect on  the standard of medical care.

## 2021-06-03 DIAGNOSIS — M5416 Radiculopathy, lumbar region: Secondary | ICD-10-CM | POA: Diagnosis not present

## 2021-06-04 ENCOUNTER — Ambulatory Visit: Payer: Medicare Other | Admitting: Rheumatology

## 2021-06-04 DIAGNOSIS — M5136 Other intervertebral disc degeneration, lumbar region: Secondary | ICD-10-CM

## 2021-06-04 DIAGNOSIS — N1831 Chronic kidney disease, stage 3a: Secondary | ICD-10-CM

## 2021-06-04 DIAGNOSIS — G894 Chronic pain syndrome: Secondary | ICD-10-CM

## 2021-06-04 DIAGNOSIS — Z8709 Personal history of other diseases of the respiratory system: Secondary | ICD-10-CM

## 2021-06-04 DIAGNOSIS — K760 Fatty (change of) liver, not elsewhere classified: Secondary | ICD-10-CM

## 2021-06-04 DIAGNOSIS — F17200 Nicotine dependence, unspecified, uncomplicated: Secondary | ICD-10-CM

## 2021-06-04 DIAGNOSIS — E559 Vitamin D deficiency, unspecified: Secondary | ICD-10-CM

## 2021-06-04 DIAGNOSIS — I10 Essential (primary) hypertension: Secondary | ICD-10-CM

## 2021-06-04 DIAGNOSIS — F419 Anxiety disorder, unspecified: Secondary | ICD-10-CM

## 2021-06-04 DIAGNOSIS — M249 Joint derangement, unspecified: Secondary | ICD-10-CM

## 2021-06-04 DIAGNOSIS — R760 Raised antibody titer: Secondary | ICD-10-CM

## 2021-06-04 DIAGNOSIS — L308 Other specified dermatitis: Secondary | ICD-10-CM

## 2021-06-04 DIAGNOSIS — C44212 Basal cell carcinoma of skin of right ear and external auricular canal: Secondary | ICD-10-CM

## 2021-06-04 DIAGNOSIS — G629 Polyneuropathy, unspecified: Secondary | ICD-10-CM

## 2021-06-04 DIAGNOSIS — G8929 Other chronic pain: Secondary | ICD-10-CM

## 2021-06-04 DIAGNOSIS — N2581 Secondary hyperparathyroidism of renal origin: Secondary | ICD-10-CM

## 2021-06-04 DIAGNOSIS — D472 Monoclonal gammopathy: Secondary | ICD-10-CM

## 2021-06-04 DIAGNOSIS — G600 Hereditary motor and sensory neuropathy: Secondary | ICD-10-CM

## 2021-06-04 DIAGNOSIS — E7800 Pure hypercholesterolemia, unspecified: Secondary | ICD-10-CM

## 2021-06-06 ENCOUNTER — Ambulatory Visit: Payer: Self-pay | Admitting: Family Medicine

## 2021-06-14 ENCOUNTER — Ambulatory Visit: Payer: Self-pay | Admitting: Family Medicine

## 2021-06-18 ENCOUNTER — Ambulatory Visit: Payer: Self-pay | Admitting: Family Medicine

## 2021-06-18 DIAGNOSIS — N1831 Chronic kidney disease, stage 3a: Secondary | ICD-10-CM | POA: Diagnosis not present

## 2021-06-18 DIAGNOSIS — D472 Monoclonal gammopathy: Secondary | ICD-10-CM | POA: Diagnosis not present

## 2021-06-18 DIAGNOSIS — I129 Hypertensive chronic kidney disease with stage 1 through stage 4 chronic kidney disease, or unspecified chronic kidney disease: Secondary | ICD-10-CM | POA: Diagnosis not present

## 2021-06-18 DIAGNOSIS — R809 Proteinuria, unspecified: Secondary | ICD-10-CM | POA: Diagnosis not present

## 2021-07-03 DIAGNOSIS — M5416 Radiculopathy, lumbar region: Secondary | ICD-10-CM | POA: Diagnosis not present

## 2021-07-03 DIAGNOSIS — M48061 Spinal stenosis, lumbar region without neurogenic claudication: Secondary | ICD-10-CM | POA: Diagnosis not present

## 2021-07-03 DIAGNOSIS — D472 Monoclonal gammopathy: Secondary | ICD-10-CM | POA: Diagnosis not present

## 2021-07-03 DIAGNOSIS — I129 Hypertensive chronic kidney disease with stage 1 through stage 4 chronic kidney disease, or unspecified chronic kidney disease: Secondary | ICD-10-CM | POA: Diagnosis not present

## 2021-07-03 DIAGNOSIS — R809 Proteinuria, unspecified: Secondary | ICD-10-CM | POA: Diagnosis not present

## 2021-07-03 DIAGNOSIS — N1831 Chronic kidney disease, stage 3a: Secondary | ICD-10-CM | POA: Diagnosis not present

## 2021-08-19 NOTE — Progress Notes (Deleted)
Office Visit Note  Patient: Juan Yang             Date of Birth: 05-Jul-1955           MRN: [id number]             PCP: Monico Blitz, MD Referring: Monico Blitz, MD Visit Date: 09/02/2021 Occupation: @GUAROCC @  Subjective:  No chief complaint on file.   History of Present Illness: Juan Yang is a 66 y.o. male ***   Activities of Daily Living:  Patient reports morning stiffness for *** {minute/hour:19697}.   Patient {ACTIONS;DENIES/REPORTS:21021675::"Denies"} nocturnal pain.  Difficulty dressing/grooming: {ACTIONS;DENIES/REPORTS:21021675::"Denies"} Difficulty climbing stairs: {ACTIONS;DENIES/REPORTS:21021675::"Denies"} Difficulty getting out of chair: {ACTIONS;DENIES/REPORTS:21021675::"Denies"} Difficulty using hands for taps, buttons, cutlery, and/or writing: {ACTIONS;DENIES/REPORTS:21021675::"Denies"}  No Rheumatology ROS completed.   PMFS History:  Patient Active Problem List   Diagnosis Date Noted  . Secondary hyperparathyroidism ([place]) 07/22/2020  . Vitamin D deficiency 07/22/2020  . Anxiety and depression 07/22/2020  . MGUS (monoclonal gammopathy of unknown significance) 07/22/2020  . History of COPD 07/22/2020  . Tobacco use disorder 07/22/2020  . Fatty liver 07/22/2020  . Essential hypertension 07/22/2020  . DDD (degenerative disc disease), lumbar 07/22/2020  . Hypercholesteremia 06/30/2020  . Stage 3a chronic kidney disease ([place]) 06/30/2020  . Basal cell carcinoma 06/07/2020  . Lumbar radiculopathy 05/02/2020  . Guaiac positive stools 10/30/2016  . 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 Peripheral neuropathy 10/29/2016    Past Medical History:  Diagnosis Date  . AAA (abdominal aortic aneurysm)    3.2 cm 02/01/18 CT, recommended [place] in 3 years  . Anxiety   . Arthritis   . 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 Charcot-Marie-Tooth disease type 2   . CKD (chronic kidney  disease)   . Dyspnea   . Guaiac + stool 10/29/2016  . History of kidney stones   . Hypertension   . Peripheral neuropathy 10/29/2016    Family History  Problem Relation Age of Onset  . Charcot-Marie-Tooth disease Mother   . Heart disease Father   . COPD Father   . Arthritis Sister    Past Surgical History:  Procedure Laterality Date  . COLONOSCOPY WITH PROPOFOL N/A 11/14/2016   Procedure: COLONOSCOPY WITH PROPOFOL;  Surgeon: Rogene Houston, MD;  [facility];  Service: Endoscopy;  Laterality: N/A;  9:15  . LUMBAR LAMINECTOMY/DECOMPRESSION MICRODISCECTOMY Right 05/24/2019   Procedure: LAMINECTOMY AND FORAMINOTOMY LUMBAR FIVE- SACRAL ONE RIGHT;  Surgeon: Vallarie Mare, MD;  Location: [place];  Service: Neurosurgery;  Laterality: Right;  posterior/right  . MOHS SURGERY Right 05/2020   ear  . MOHS SURGERY    . POLYPECTOMY  11/14/2016   Procedure: POLYPECTOMY- SIGMOID COLON X3 TRANSVERSE COLON X2;  Surgeon: Rogene Houston, MD;  [facility];  Service: Endoscopy;;  . rt ankle surgery     Titanium hinge  . TONSILLECTOMY    . TRANSFORAMINAL LUMBAR INTERBODY FUSION (TLIF) WITH PEDICLE SCREW FIXATION 1 LEVEL N/A 05/02/2020   Procedure: Lumbar five Sacral one Transforaminal lumbar interbody fusion;  Surgeon: Vallarie Mare, MD;  Location: [place];  Service: Neurosurgery;  Laterality: N/A;   Social History   Social History Narrative   Right handed   Lives with wife in one story home    There is no immunization history on file for this patient.   Objective: Vital Signs: There were no vitals  taken for this visit.   Physical Exam   Musculoskeletal Exam: ***  CDAI Exam: CDAI Score: -- Patient Global: --; Provider Global: -- Swollen: --; Tender: -- Joint Exam 09/02/2021   No joint exam has been documented for this visit   There is currently no information documented on the homunculus. Go to the Rheumatology activity and complete the homunculus  joint exam.  Investigation: No additional findings.  Imaging: No results found.  Recent Labs: Lab Results  Component Value Date   WBC 13.3 (H) 04/11/2021   HGB 15.2 04/11/2021   PLT 312 04/11/2021   NA 139 04/11/2021   K 3.1 (L) 04/11/2021   CL 104 04/11/2021   CO2 24 04/11/2021   GLUCOSE 111 (H) 04/11/2021   BUN 12 04/11/2021   CREATININE 1.39 (H) 04/11/2021   BILITOT 0.5 04/11/2021   ALKPHOS 118 04/11/2021   AST 17 04/11/2021   ALT 13 04/11/2021   PROT 7.5 04/11/2021   ALBUMIN 3.9 04/11/2021   CALCIUM 9.5 04/11/2021   GFRAA 50 (L) 04/11/2020   QFTBGOLDPLUS NEGATIVE 04/11/2020    Speciality Comments: No specialty comments available.  Procedures:  No procedures performed Allergies: Other, Atenolol, Cymbalta [duloxetine hcl], Latex, and Penicillins   Assessment / Plan:     Visit Diagnoses: No diagnosis found.  Orders: No orders of the defined types were placed in this encounter.  No orders of the defined types were placed in this encounter.   Face-to-face time spent with patient was *** minutes. Greater than 50% of time was spent in counseling and coordination of care.  Follow-Up Instructions: No follow-ups on file.   Earnestine Mealing, CMA  Note - This record has been created using Editor, commissioning.  Chart creation errors have been sought, but may not always  have been located. Such creation errors do not reflect on  the standard of medical care.

## 2021-08-27 NOTE — Progress Notes (Deleted)
Office Visit Note  Patient: Juan Yang             Date of Birth: 1955/06/20           MRN: [id number]             PCP: Monico Blitz, MD Referring: Monico Blitz, MD Visit Date: 08/28/2021 Occupation: @GUAROCC @  Subjective:  No chief complaint on file.   History of Present Illness: Juan Yang is a 66 y.o. male ***   Activities of Daily Living:  Patient reports morning stiffness for *** {minute/hour:19697}.   Patient {ACTIONS;DENIES/REPORTS:21021675::"Denies"} nocturnal pain.  Difficulty dressing/grooming: {ACTIONS;DENIES/REPORTS:21021675::"Denies"} Difficulty climbing stairs: {ACTIONS;DENIES/REPORTS:21021675::"Denies"} Difficulty getting out of chair: {ACTIONS;DENIES/REPORTS:21021675::"Denies"} Difficulty using hands for taps, buttons, cutlery, and/or writing: {ACTIONS;DENIES/REPORTS:21021675::"Denies"}  No Rheumatology ROS completed.   PMFS History:  Patient Active Problem List   Diagnosis Date Noted   Secondary hyperparathyroidism ([place]) 07/22/2020   Vitamin D deficiency 07/22/2020   Anxiety and depression 07/22/2020   MGUS (monoclonal gammopathy of unknown significance) 07/22/2020   History of COPD 07/22/2020   Tobacco use disorder 07/22/2020   Fatty liver 07/22/2020   Essential hypertension 07/22/2020   DDD (degenerative disc disease), lumbar 07/22/2020   Hypercholesteremia 06/30/2020   Stage 3a chronic kidney disease ([place]) 06/30/2020   Basal cell carcinoma 06/07/2020   Lumbar radiculopathy 05/02/2020   Guaiac positive stools 10/30/2016   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   Peripheral neuropathy 10/29/2016    Past Medical History:  Diagnosis Date   AAA (abdominal aortic aneurysm)    3.2 cm 02/01/18 CT, recommended [place] in 3 years   Anxiety    Arthritis    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   Charcot-Marie-Tooth disease type 2    CKD (chronic kidney disease)    Dyspnea     Guaiac + stool 10/29/2016   History of kidney stones    Hypertension    Peripheral neuropathy 10/29/2016    Family History  Problem Relation Age of Onset   Charcot-Marie-Tooth disease Mother    Heart disease Father    COPD Father    Arthritis Sister    Past Surgical History:  Procedure Laterality Date   COLONOSCOPY WITH PROPOFOL N/A 11/14/2016   Procedure: COLONOSCOPY WITH PROPOFOL;  Surgeon: Rogene Houston, MD;  [facility];  Service: Endoscopy;  Laterality: N/A;  9:15   LUMBAR LAMINECTOMY/DECOMPRESSION MICRODISCECTOMY Right 05/24/2019   Procedure: LAMINECTOMY AND FORAMINOTOMY LUMBAR FIVE- SACRAL ONE RIGHT;  Surgeon: Vallarie Mare, MD;  Location: [place];  Service: Neurosurgery;  Laterality: Right;  posterior/right   MOHS SURGERY Right 05/2020   ear   MOHS SURGERY     POLYPECTOMY  11/14/2016   Procedure: POLYPECTOMY- SIGMOID COLON X3 TRANSVERSE COLON X2;  Surgeon: Rogene Houston, MD;  [facility];  Service: Endoscopy;;   rt ankle surgery     Titanium hinge   TONSILLECTOMY     TRANSFORAMINAL LUMBAR INTERBODY FUSION (TLIF) WITH PEDICLE SCREW FIXATION 1 LEVEL N/A 05/02/2020   Procedure: Lumbar five Sacral one Transforaminal lumbar interbody fusion;  Surgeon: Vallarie Mare, MD;  Location: [place];  Service: Neurosurgery;  Laterality: N/A;   Social History   Social History Narrative   Right handed   Lives with wife in one story home    There is no immunization history on file for this patient.   Objective: Vital Signs: There were no vitals  taken for this visit.   Physical Exam   Musculoskeletal Exam: ***  CDAI Exam: CDAI Score: -- Patient Global: --; Provider Global: -- Swollen: --; Tender: -- Joint Exam 08/28/2021   No joint exam has been documented for this visit   There is currently no information documented on the homunculus. Go to the Rheumatology activity and complete the homunculus joint exam.  Investigation: No  additional findings.  Imaging: No results found.  Recent Labs: Lab Results  Component Value Date   WBC 13.3 (H) 04/11/2021   HGB 15.2 04/11/2021   PLT 312 04/11/2021   NA 139 04/11/2021   K 3.1 (L) 04/11/2021   CL 104 04/11/2021   CO2 24 04/11/2021   GLUCOSE 111 (H) 04/11/2021   BUN 12 04/11/2021   CREATININE 1.39 (H) 04/11/2021   BILITOT 0.5 04/11/2021   ALKPHOS 118 04/11/2021   AST 17 04/11/2021   ALT 13 04/11/2021   PROT 7.5 04/11/2021   ALBUMIN 3.9 04/11/2021   CALCIUM 9.5 04/11/2021   GFRAA 50 (L) 04/11/2020   QFTBGOLDPLUS NEGATIVE 04/11/2020    Speciality Comments: No specialty comments available.  Procedures:  No procedures performed Allergies: Other, Atenolol, Cymbalta [duloxetine hcl], Latex, and Penicillins   Assessment / Plan:     Visit Diagnoses: No diagnosis found.  Orders: No orders of the defined types were placed in this encounter.  No orders of the defined types were placed in this encounter.   Face-to-face time spent with patient was *** minutes. Greater than 50% of time was spent in counseling and coordination of care.  Follow-Up Instructions: No follow-ups on file.   Earnestine Mealing, CMA  Note - This record has been created using Editor, commissioning.  Chart creation errors have been sought, but may not always  have been located. Such creation errors do not reflect on  the standard of medical care.

## 2021-08-28 ENCOUNTER — Ambulatory Visit: Payer: Medicare Other | Admitting: Rheumatology

## 2021-08-28 DIAGNOSIS — G894 Chronic pain syndrome: Secondary | ICD-10-CM

## 2021-08-28 DIAGNOSIS — C44212 Basal cell carcinoma of skin of right ear and external auricular canal: Secondary | ICD-10-CM

## 2021-08-28 DIAGNOSIS — F419 Anxiety disorder, unspecified: Secondary | ICD-10-CM

## 2021-08-28 DIAGNOSIS — M5136 Other intervertebral disc degeneration, lumbar region: Secondary | ICD-10-CM

## 2021-08-28 DIAGNOSIS — R760 Raised antibody titer: Secondary | ICD-10-CM

## 2021-08-28 DIAGNOSIS — L308 Other specified dermatitis: Secondary | ICD-10-CM

## 2021-08-28 DIAGNOSIS — G600 Hereditary motor and sensory neuropathy: Secondary | ICD-10-CM

## 2021-08-28 DIAGNOSIS — M249 Joint derangement, unspecified: Secondary | ICD-10-CM

## 2021-08-28 DIAGNOSIS — E559 Vitamin D deficiency, unspecified: Secondary | ICD-10-CM

## 2021-08-28 DIAGNOSIS — G8929 Other chronic pain: Secondary | ICD-10-CM

## 2021-08-28 DIAGNOSIS — Z6830 Body mass index (BMI) 30.0-30.9, adult: Secondary | ICD-10-CM

## 2021-08-28 DIAGNOSIS — F17200 Nicotine dependence, unspecified, uncomplicated: Secondary | ICD-10-CM

## 2021-08-28 DIAGNOSIS — Z8709 Personal history of other diseases of the respiratory system: Secondary | ICD-10-CM

## 2021-08-28 DIAGNOSIS — E7800 Pure hypercholesterolemia, unspecified: Secondary | ICD-10-CM

## 2021-08-28 DIAGNOSIS — D472 Monoclonal gammopathy: Secondary | ICD-10-CM

## 2021-08-28 DIAGNOSIS — I10 Essential (primary) hypertension: Secondary | ICD-10-CM

## 2021-08-28 DIAGNOSIS — N1831 Chronic kidney disease, stage 3a: Secondary | ICD-10-CM

## 2021-08-28 DIAGNOSIS — K760 Fatty (change of) liver, not elsewhere classified: Secondary | ICD-10-CM

## 2021-08-28 DIAGNOSIS — N2581 Secondary hyperparathyroidism of renal origin: Secondary | ICD-10-CM

## 2021-08-28 DIAGNOSIS — G629 Polyneuropathy, unspecified: Secondary | ICD-10-CM

## 2021-08-28 NOTE — Progress Notes (Addendum)
Office Visit Note  Patient: Juan Yang             Date of Birth: November 16, 1955           MRN: [id number]             PCP: Monico Blitz, MD Referring: Monico Blitz, MD Visit Date: 09/06/2021 Occupation: @GUAROCC @  Subjective:  Lower back pain  History of Present Illness: Juan Yang is a 66 y.o. male with history of osteoarthritis and anticardiolipin antibodies.  He states he continues to have lower back pain.  He also has peripheral neuropathy which makes it difficult for him to walk.  He mobilizes with the help of a cane.  He states that the left knee joint pain has improved.  He continues to have chronic pain.  He denies any history of oral ulcers, nasal ulcers, malar rash, photosensitivity, Raynaud's phenomenon or lymphadenopathy.  He continues to have dry mouth symptoms.  Activities of Daily Living:  Patient reports morning stiffness for 30-40 minutes.   Patient Reports nocturnal pain.  Difficulty dressing/grooming: Reports Difficulty climbing stairs: Denies Difficulty getting out of chair: Reports Difficulty using hands for taps, buttons, cutlery, and/or writing: Reports  Review of Systems  Constitutional:  Positive for fatigue.  HENT:  Positive for mouth dryness. Negative for mouth sores.   Eyes:  Negative for dryness.  Respiratory:  Negative for difficulty breathing.   Cardiovascular:  Positive for palpitations.  Gastrointestinal:  Positive for constipation. Negative for blood in stool and diarrhea.  Endocrine: Negative for increased urination.  Genitourinary:  Negative for involuntary urination.  Musculoskeletal:  Positive for joint pain, gait problem, joint pain, myalgias, morning stiffness, muscle tenderness and myalgias. Negative for joint swelling and muscle weakness.  Skin:  Negative for color change, rash, hair loss and sensitivity to sunlight.  Allergic/Immunologic: Negative for susceptible to infections.  Neurological:  Negative for dizziness and  headaches.  Hematological:  Negative for swollen glands.  Psychiatric/Behavioral:  Positive for depressed mood and sleep disturbance. The patient is nervous/anxious.     PMFS History:  Patient Active Problem List   Diagnosis Date Noted   Secondary hyperparathyroidism ([place]) 07/22/2020   Vitamin D deficiency 07/22/2020   Anxiety and depression 07/22/2020   MGUS (monoclonal gammopathy of unknown significance) 07/22/2020   History of COPD 07/22/2020   Tobacco use disorder 07/22/2020   Fatty liver 07/22/2020   Essential hypertension 07/22/2020   DDD (degenerative disc disease), lumbar 07/22/2020   Hypercholesteremia 06/30/2020   Stage 3a chronic kidney disease ([place]) 06/30/2020   Basal cell carcinoma 06/07/2020   Lumbar radiculopathy 05/02/2020   Guaiac positive stools 10/30/2016   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   Peripheral neuropathy 10/29/2016    Past Medical History:  Diagnosis Date   AAA (abdominal aortic aneurysm) ([place])    3.2 cm 02/01/18 CT, recommended [place] in 3 years   Anxiety    Arthritis    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   Charcot-Marie-Tooth disease type 2    CKD (chronic kidney disease)    Dyspnea    Guaiac + stool 10/29/2016   History of kidney stones    Hypertension    Peripheral neuropathy 10/29/2016    Family History  Problem Relation Age of Onset   Charcot-Marie-Tooth disease Mother    Heart disease Father    COPD Father    Arthritis Sister    Past Surgical  History:  Procedure Laterality Date   COLONOSCOPY WITH PROPOFOL N/A 11/14/2016   Procedure: COLONOSCOPY WITH PROPOFOL;  Surgeon: Rogene Houston, MD;  [facility];  Service: Endoscopy;  Laterality: N/A;  9:15   LUMBAR LAMINECTOMY/DECOMPRESSION MICRODISCECTOMY Right 05/24/2019   Procedure: LAMINECTOMY AND FORAMINOTOMY LUMBAR FIVE- SACRAL ONE RIGHT;  Surgeon: Vallarie Mare, MD;  Location: [facility];   Service: Neurosurgery;  Laterality: Right;  posterior/right   MOHS SURGERY Right 05/2020   ear   MOHS SURGERY     POLYPECTOMY  11/14/2016   Procedure: POLYPECTOMY- SIGMOID COLON X3 TRANSVERSE COLON X2;  Surgeon: Rogene Houston, MD;  [facility];  Service: Endoscopy;;   rt ankle surgery     Titanium hinge   TONSILLECTOMY     TRANSFORAMINAL LUMBAR INTERBODY FUSION (TLIF) WITH PEDICLE SCREW FIXATION 1 LEVEL N/A 05/02/2020   Procedure: Lumbar five Sacral one Transforaminal lumbar interbody fusion;  Surgeon: Vallarie Mare, MD;  Location: [facility];  Service: Neurosurgery;  Laterality: N/A;   Social History   Social History Narrative   Right handed   Lives with wife in one story home    There is no immunization history on file for this patient.   Objective: Vital Signs: BP 124/84 (BP Location: Left Arm, Patient Position: Sitting, Cuff Size: Normal)   Pulse (!) 51   Resp 17   Ht 6' (1.829 m)   Wt 208 lb (94.3 kg)   BMI 28.21 kg/m    Physical Exam Vitals and nursing note reviewed.  Constitutional:      Appearance: He is well-developed.  HENT:     Head: Normocephalic and atraumatic.  Eyes:     Conjunctiva/sclera: Conjunctivae normal.     Pupils: Pupils are equal, round, and reactive to light.  Cardiovascular:     Rate and Rhythm: Normal rate and regular rhythm.     Heart sounds: Normal heart sounds.  Pulmonary:     Effort: Pulmonary effort is normal.     Breath sounds: Normal breath sounds.  Abdominal:     General: Bowel sounds are normal.     Palpations: Abdomen is soft.  Musculoskeletal:     Cervical back: Normal range of motion and neck supple.  Skin:    General: Skin is warm and dry.     Capillary Refill: Capillary refill takes less than 2 seconds.  Neurological:     Mental Status: He is alert and oriented to person, place, and time.  Psychiatric:        Behavior: Behavior normal.      Musculoskeletal Exam: C-spine was in good range of motion.  He  had limited painful range of motion of her lumbar spine.  Shoulder joints, elbow joints, wrist joints, MCPs PIPs and DIPs with good range of motion.  He had bilateral ulnar deviation.  No synovitis was noted.  Hip joints and knee joints with good range of motion.  No warmth swelling or effusion was noted.  CDAI Exam: CDAI Score: -- Patient Global: --; Provider Global: -- Swollen: --; Tender: -- Joint Exam 09/06/2021   No joint exam has been documented for this visit   There is currently no information documented on the homunculus. Go to the Rheumatology activity and complete the homunculus joint exam.  Investigation: No additional findings.  Imaging: No results found.  Recent Labs: Lab Results  Component Value Date   WBC 13.3 (H) 04/11/2021   HGB 15.2 04/11/2021   PLT 312 04/11/2021  NA 139 04/11/2021   K 3.1 (L) 04/11/2021   CL 104 04/11/2021   CO2 24 04/11/2021   GLUCOSE 111 (H) 04/11/2021   BUN 12 04/11/2021   CREATININE 1.39 (H) 04/11/2021   BILITOT 0.5 04/11/2021   ALKPHOS 118 04/11/2021   AST 17 04/11/2021   ALT 13 04/11/2021   PROT 7.5 04/11/2021   ALBUMIN 3.9 04/11/2021   CALCIUM 9.5 04/11/2021   GFRAA 50 (L) 04/11/2020   QFTBGOLDPLUS NEGATIVE 04/11/2020    Speciality Comments: No specialty comments available.  Procedures:  No procedures performed Allergies: Other, Atenolol, Cymbalta [duloxetine hcl], Latex, and Penicillins   Assessment / Plan:     Visit Diagnoses: Asteatotic dermatitis - he was evaluated by dermatology for rash.    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  There is no history of DVT or blood clots.  His repeat anticardiolipin and beta-2 IgM were positive . I will check antibody titers again.  He denies any history of oral ulcers, nasal ulcers, malar rash, photosensitivity, Raynaud's phenomenon or lymphadenopathy.  Plan: ANA, C3 and C4, Beta-2 glycoprotein antibodies, Cardiolipin  antibodies, IgG, IgM, IgA.  We will contact him once the lab results are available.  Hypermobility of joint-he has hypermobility in most of his joints.  DDD (degenerative disc disease), lumbar -he continues to have lower back pain.  He mobilizes with the help of a cane.  Status postlaminectomy April 2021 by Dr. Marcello Moores. s/p fusion 04/2020.   Chronic pain of left knee -he states that the knee joint pain has improved.  He limps due to chronic lower back pain.  Chronic pain syndrome - Treated with Percocet for lower back pain.  Other medical problems listed as follows:  Hypercholesteremia  Essential hypertension  History of COPD  Stage 3a chronic kidney disease ([place]) - Diagnosed 2018, followed by Dr. Theador Hawthorne.   Tobacco use disorder - 1 pack/day for 20 years.  He quit smoking in 2013.  He is vaping currently.  Anxiety and depression  Fatty liver  Basal cell carcinoma (BCC) of skin of right ear - he had surgery June 2022.  Secondary hyperparathyroidism ([place])  Charcot Marie Tooth muscular atrophy  MGUS (monoclonal gammopathy of unknown significance) - Followed by oncology.  Neuropathy  Vitamin D deficiency  BMI 30.0-30.9,adult  Orders: Orders Placed This Encounter  Procedures   ANA   C3 and C4   Beta-2 glycoprotein antibodies   Cardiolipin antibodies, IgG, IgM, IgA   No orders of the defined types were placed in this encounter.    Follow-Up Instructions: Return in about 1 year (around 09/07/2022) for Osteoarthritis.   Bo Merino, MD  Note - This record has been created using Editor, commissioning.  Chart creation errors have been sought, but may not always  have been located. Such creation errors do not reflect on  the standard of medical care.

## 2021-09-02 ENCOUNTER — Ambulatory Visit: Payer: Medicare Other | Admitting: Rheumatology

## 2021-09-02 DIAGNOSIS — N2581 Secondary hyperparathyroidism of renal origin: Secondary | ICD-10-CM

## 2021-09-02 DIAGNOSIS — C44212 Basal cell carcinoma of skin of right ear and external auricular canal: Secondary | ICD-10-CM

## 2021-09-02 DIAGNOSIS — M249 Joint derangement, unspecified: Secondary | ICD-10-CM

## 2021-09-02 DIAGNOSIS — G8929 Other chronic pain: Secondary | ICD-10-CM

## 2021-09-02 DIAGNOSIS — L308 Other specified dermatitis: Secondary | ICD-10-CM

## 2021-09-02 DIAGNOSIS — E7800 Pure hypercholesterolemia, unspecified: Secondary | ICD-10-CM

## 2021-09-02 DIAGNOSIS — R760 Raised antibody titer: Secondary | ICD-10-CM

## 2021-09-02 DIAGNOSIS — I10 Essential (primary) hypertension: Secondary | ICD-10-CM

## 2021-09-02 DIAGNOSIS — F32A Depression, unspecified: Secondary | ICD-10-CM

## 2021-09-02 DIAGNOSIS — G600 Hereditary motor and sensory neuropathy: Secondary | ICD-10-CM

## 2021-09-02 DIAGNOSIS — K760 Fatty (change of) liver, not elsewhere classified: Secondary | ICD-10-CM

## 2021-09-02 DIAGNOSIS — D472 Monoclonal gammopathy: Secondary | ICD-10-CM

## 2021-09-02 DIAGNOSIS — M5136 Other intervertebral disc degeneration, lumbar region: Secondary | ICD-10-CM

## 2021-09-02 DIAGNOSIS — G894 Chronic pain syndrome: Secondary | ICD-10-CM

## 2021-09-02 DIAGNOSIS — G629 Polyneuropathy, unspecified: Secondary | ICD-10-CM

## 2021-09-02 DIAGNOSIS — E559 Vitamin D deficiency, unspecified: Secondary | ICD-10-CM

## 2021-09-02 DIAGNOSIS — F17200 Nicotine dependence, unspecified, uncomplicated: Secondary | ICD-10-CM

## 2021-09-02 DIAGNOSIS — Z8709 Personal history of other diseases of the respiratory system: Secondary | ICD-10-CM

## 2021-09-02 DIAGNOSIS — N1831 Chronic kidney disease, stage 3a: Secondary | ICD-10-CM

## 2021-09-06 ENCOUNTER — Encounter: Payer: Self-pay | Admitting: Rheumatology

## 2021-09-06 ENCOUNTER — Ambulatory Visit: Payer: Medicare Other | Admitting: Rheumatology

## 2021-09-06 VITALS — BP 124/84 | HR 51 | Resp 17 | Ht 72.0 in | Wt 208.0 lb

## 2021-09-06 DIAGNOSIS — N1831 Chronic kidney disease, stage 3a: Secondary | ICD-10-CM

## 2021-09-06 DIAGNOSIS — M249 Joint derangement, unspecified: Secondary | ICD-10-CM

## 2021-09-06 DIAGNOSIS — G600 Hereditary motor and sensory neuropathy: Secondary | ICD-10-CM

## 2021-09-06 DIAGNOSIS — M51369 Other intervertebral disc degeneration, lumbar region without mention of lumbar back pain or lower extremity pain: Secondary | ICD-10-CM

## 2021-09-06 DIAGNOSIS — G8929 Other chronic pain: Secondary | ICD-10-CM

## 2021-09-06 DIAGNOSIS — F32A Depression, unspecified: Secondary | ICD-10-CM

## 2021-09-06 DIAGNOSIS — R760 Raised antibody titer: Secondary | ICD-10-CM | POA: Diagnosis not present

## 2021-09-06 DIAGNOSIS — F17200 Nicotine dependence, unspecified, uncomplicated: Secondary | ICD-10-CM

## 2021-09-06 DIAGNOSIS — L308 Other specified dermatitis: Secondary | ICD-10-CM

## 2021-09-06 DIAGNOSIS — Z6827 Body mass index (BMI) 27.0-27.9, adult: Secondary | ICD-10-CM | POA: Diagnosis not present

## 2021-09-06 DIAGNOSIS — Z8709 Personal history of other diseases of the respiratory system: Secondary | ICD-10-CM

## 2021-09-06 DIAGNOSIS — I129 Hypertensive chronic kidney disease with stage 1 through stage 4 chronic kidney disease, or unspecified chronic kidney disease: Secondary | ICD-10-CM | POA: Diagnosis not present

## 2021-09-06 DIAGNOSIS — C44212 Basal cell carcinoma of skin of right ear and external auricular canal: Secondary | ICD-10-CM

## 2021-09-06 DIAGNOSIS — G894 Chronic pain syndrome: Secondary | ICD-10-CM

## 2021-09-06 DIAGNOSIS — Z6830 Body mass index (BMI) 30.0-30.9, adult: Secondary | ICD-10-CM

## 2021-09-06 DIAGNOSIS — M5136 Other intervertebral disc degeneration, lumbar region: Secondary | ICD-10-CM

## 2021-09-06 DIAGNOSIS — F419 Anxiety disorder, unspecified: Secondary | ICD-10-CM

## 2021-09-06 DIAGNOSIS — R809 Proteinuria, unspecified: Secondary | ICD-10-CM | POA: Diagnosis not present

## 2021-09-06 DIAGNOSIS — D472 Monoclonal gammopathy: Secondary | ICD-10-CM

## 2021-09-06 DIAGNOSIS — E7800 Pure hypercholesterolemia, unspecified: Secondary | ICD-10-CM

## 2021-09-06 DIAGNOSIS — I10 Essential (primary) hypertension: Secondary | ICD-10-CM

## 2021-09-06 DIAGNOSIS — N2581 Secondary hyperparathyroidism of renal origin: Secondary | ICD-10-CM

## 2021-09-06 DIAGNOSIS — K760 Fatty (change of) liver, not elsewhere classified: Secondary | ICD-10-CM

## 2021-09-06 DIAGNOSIS — E559 Vitamin D deficiency, unspecified: Secondary | ICD-10-CM

## 2021-09-06 DIAGNOSIS — M5416 Radiculopathy, lumbar region: Secondary | ICD-10-CM | POA: Diagnosis not present

## 2021-09-06 DIAGNOSIS — M25562 Pain in left knee: Secondary | ICD-10-CM

## 2021-09-06 DIAGNOSIS — G629 Polyneuropathy, unspecified: Secondary | ICD-10-CM

## 2021-09-12 ENCOUNTER — Ambulatory Visit: Payer: Self-pay | Admitting: Family Medicine

## 2021-09-14 LAB — CARDIOLIPIN ANTIBODIES, IGG, IGM, IGA
Anticardiolipin IgA: 44.1 APL-U/mL — ABNORMAL HIGH (ref <20.0)
Anticardiolipin IgG: 3.2 GPL-U/mL (ref <20.0)
Anticardiolipin IgM: 2.5 MPL-U/mL (ref <20.0)

## 2021-09-14 LAB — ANTI-NUCLEAR AB-TITER (ANA TITER)
ANA TITER: 1:40 {titer} — ABNORMAL HIGH
ANA Titer 1: 1:80 {titer} — ABNORMAL HIGH

## 2021-09-14 LAB — C3 AND C4
C3 Complement: 151 mg/dL (ref 82–185)
C4 Complement: 52 mg/dL (ref 15–53)

## 2021-09-14 LAB — ANA: Anti Nuclear Antibody (ANA): POSITIVE — AB

## 2021-09-14 LAB — BETA-2 GLYCOPROTEIN ANTIBODIES
Beta-2 Glyco 1 IgA: 60.8 U/mL — ABNORMAL HIGH (ref <20.0)
Beta-2 Glyco 1 IgM: 6.8 U/mL (ref <20.0)
Beta-2 Glyco I IgG: 4.8 U/mL (ref <20.0)

## 2021-09-15 NOTE — Progress Notes (Signed)
Antibodies which promote clotting including beta-2 GP 1 and anticardiolipin antibodies are positive, titers are stable.  ANA is positive.  Complements are normal.  Please forward results to his hematologist.

## 2021-09-17 NOTE — Progress Notes (Signed)
Noted.  Thank you for forwarding these results.  At present, no need for anticoagulation since he has no prior history of VTE/thrombosis.

## 2021-09-18 NOTE — Progress Notes (Signed)
I called patient to discuss lab results.  Advised him to stay on aspirin 81 mg p.o. daily.  Side effects of aspirin including increased risk of GI bleed and internal bleeding was discussed.  Patient is not on any anticoagulant and does not have increased risk of falling.

## 2021-10-02 DIAGNOSIS — M549 Dorsalgia, unspecified: Secondary | ICD-10-CM | POA: Diagnosis not present

## 2021-10-03 ENCOUNTER — Ambulatory Visit (INDEPENDENT_AMBULATORY_CARE_PROVIDER_SITE_OTHER): Payer: Medicare Other | Admitting: Family Medicine

## 2021-10-03 ENCOUNTER — Encounter: Payer: Self-pay | Admitting: Family Medicine

## 2021-10-03 VITALS — BP 138/88 | HR 104 | Temp 98.6°F | Ht 72.0 in | Wt 215.0 lb

## 2021-10-03 DIAGNOSIS — M5136 Other intervertebral disc degeneration, lumbar region: Secondary | ICD-10-CM

## 2021-10-03 DIAGNOSIS — N2581 Secondary hyperparathyroidism of renal origin: Secondary | ICD-10-CM | POA: Diagnosis not present

## 2021-10-03 DIAGNOSIS — D472 Monoclonal gammopathy: Secondary | ICD-10-CM

## 2021-10-03 DIAGNOSIS — I10 Essential (primary) hypertension: Secondary | ICD-10-CM

## 2021-10-03 DIAGNOSIS — E7800 Pure hypercholesterolemia, unspecified: Secondary | ICD-10-CM | POA: Diagnosis not present

## 2021-10-03 DIAGNOSIS — K760 Fatty (change of) liver, not elsewhere classified: Secondary | ICD-10-CM

## 2021-10-03 DIAGNOSIS — R011 Cardiac murmur, unspecified: Secondary | ICD-10-CM

## 2021-10-03 DIAGNOSIS — E559 Vitamin D deficiency, unspecified: Secondary | ICD-10-CM | POA: Diagnosis not present

## 2021-10-03 DIAGNOSIS — G600 Hereditary motor and sensory neuropathy: Secondary | ICD-10-CM

## 2021-10-03 DIAGNOSIS — I714 Abdominal aortic aneurysm, without rupture, unspecified: Secondary | ICD-10-CM | POA: Insufficient documentation

## 2021-10-03 DIAGNOSIS — N1831 Chronic kidney disease, stage 3a: Secondary | ICD-10-CM | POA: Diagnosis not present

## 2021-10-03 MED ORDER — METOPROLOL SUCCINATE ER 25 MG PO TB24: 25.0000 mg | ORAL_TABLET | Freq: Every day | ORAL | 3 refills | Status: DC

## 2021-10-03 NOTE — Progress Notes (Signed)
Subjective:  Patient ID: Juan Yang, male    DOB: Aug 09, 1955, 66 y.o.   MRN: [id number]  Patient Care Team: Baruch Gouty, FNP as PCP - General (Family Medicine)   Chief Complaint:  New Patient (Initial Visit)   HPI: Juan Yang is a 66 y.o. male presenting on 10/03/2021 for New Patient (Initial Visit)   Presents today to establish care with new PCP.  He was formally followed by Dr. Manuella Ghazi in [place], [place].  He is followed by rheumatology, hematology, nephrology, and spine specialist on a regular basis.  He has a history of hypertension, hyperlipidemia, secondary hyperparathyroidism, CKD, vitamin D deficiency, MGUS, degenerative disc disease, light chain disease, and Charcot-Marie-Tooth muscular atrophy.  EHR database was reviewed in detail and prior medical records have been requested.  CT scan of thoracic spine revealed an incidental finding of an abdominal aortic aneurysm.  Patient states this is new to him and he has not had any follow-up pertaining to this.  He denies any abdominal or worsening back pain.  He has chronic back pain due to degenerative disc disease and prior surgery.  He also has right ankle pain from prior injury and surgery.  Imaging also revealed fatty liver disease.     Relevant past medical, surgical, family, and social history reviewed and updated as indicated.  Allergies and medications reviewed and updated. Data reviewed: Chart in Epic.   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   Charcot-Marie-Tooth disease type 2    CKD (chronic kidney disease)    Dyspnea    Guaiac + stool 10/29/2016   History of kidney stones    Hypertension    Peripheral neuropathy 10/29/2016   Transient cerebral ischemic attack,  unspecified 05/10/2020    Past Surgical History:  Procedure Laterality Date   COLONOSCOPY WITH PROPOFOL N/A 11/14/2016   Procedure: COLONOSCOPY WITH PROPOFOL;  Surgeon: Rogene Houston, MD;  [facility];  Service: Endoscopy;  Laterality: N/A;  9:15   LUMBAR LAMINECTOMY/DECOMPRESSION MICRODISCECTOMY Right 05/24/2019   Procedure: LAMINECTOMY AND FORAMINOTOMY LUMBAR FIVE- SACRAL ONE RIGHT;  Surgeon: Vallarie Mare, MD;  Location: [facility];  Service: Neurosurgery;  Laterality: Right;  posterior/right   MOHS SURGERY Right 05/2020   ear   MOHS SURGERY     POLYPECTOMY  11/14/2016   Procedure: POLYPECTOMY- SIGMOID COLON X3 TRANSVERSE COLON X2;  Surgeon: Rogene Houston, MD;  [facility];  Service: Endoscopy;;   rt ankle surgery     Titanium hinge   TONSILLECTOMY     TRANSFORAMINAL LUMBAR INTERBODY FUSION (TLIF) WITH PEDICLE SCREW FIXATION 1 LEVEL N/A 05/02/2020   Procedure: Lumbar five Sacral one Transforaminal lumbar interbody fusion;  Surgeon: Vallarie Mare, MD;  Location: [facility];  Service: Neurosurgery;  Laterality: N/A;    Social History   Socioeconomic History   Marital status: Married    Spouse name: Not on file   Number of children: Not on file   Years of education: Not on file   Highest education level: Not on file  Occupational History   Not on file  Tobacco Use   Smoking status: Former    Packs/day: 1.50  Years: 20.00    Total pack years: 30.00    Types: Cigarettes    Quit date: 11/12/2012    Years since quitting: 8.8    Passive exposure: Never   Smokeless tobacco: Never  Vaping Use   Vaping Use: Some days  Substance and Sexual Activity   Alcohol use: No   Drug use: No   Sexual activity: Yes    Birth control/protection: None  Other Topics Concern   Not on file  Social History Narrative   Right handed   Lives with wife in one story home   Social Determinants of Health   Financial Resource Strain: Not on file  Food Insecurity:  Not on file  Transportation Needs: No Transportation Needs (05/30/2020)   PRAPARE - Transportation    Lack of Transportation (Medical): No    Lack of Transportation (Non-Medical): No  Physical Activity: Inactive (05/30/2020)   Exercise Vital Sign    Days of Exercise per Week: 0 days    Minutes of Exercise per Session: 0 min  Stress: Not on file  Social Connections: Not on file  Intimate Partner Violence: Not At Risk (05/30/2020)   Humiliation, Afraid, Rape, and Kick questionnaire    Fear of Current or Ex-Partner: No    Emotionally Abused: No    Physically Abused: No    Sexually Abused: No    Outpatient Encounter Medications as of 10/03/2021  Medication Sig   amLODipine (NORVASC) 2.5 MG tablet Take 2.5 mg by mouth 2 (two) times daily.   aspirin EC (ADULT ASPIRIN REGIMEN) 81 MG tablet    Ca Carbonate-Mag Hydroxide (ROLAIDS PO) Take 2-3 tablets by mouth daily as needed (heartburn).   clobetasol cream (TEMOVATE) 5.88 % Apply 1 application topically 2 (two) times daily as needed (rash).   cyclobenzaprine (FLEXERIL) 10 MG tablet Take 1 tablet (10 mg total) by mouth 3 (three) times daily as needed for muscle spasms.   Glucosamine HCl-MSM (GLUCOSAMINE-MSM PO) Take 1 tablet by mouth daily.   losartan (COZAAR) 25 MG tablet Take 25 mg by mouth daily.   methylPREDNISolone (MEDROL DOSEPAK) 4 MG TBPK tablet Take by mouth.   oxyCODONE-acetaminophen (PERCOCET/ROXICET) 5-325 MG tablet Take 1-2 tablets by mouth every 6 (six) hours as needed for moderate pain.   Potassium 99 MG TABS Take 99 mg by mouth as needed.   sildenafil (VIAGRA) 100 MG tablet Take 100 mg by mouth daily as needed for erectile dysfunction.   sodium bicarbonate 650 MG tablet Take 650 mg by mouth 2 (two) times daily.   [DISCONTINUED] metoprolol succinate (TOPROL-XL) 25 MG 24 hr tablet Take 12.5 mg by mouth daily.   metoprolol succinate (TOPROL-XL) 25 MG 24 hr tablet Take 1 tablet (25 mg total) by mouth daily.   [DISCONTINUED] Omega-3  1000 MG CAPS Take 1,000 mg by mouth as needed.   No facility-administered encounter medications on file as of 10/03/2021.    Allergies  Allergen Reactions   Other Hives    DUCK EGGS ONLY   Atenolol Diarrhea and Nausea Only   Cymbalta [Duloxetine Hcl] Other (See Comments)    Pt states it shut his bladder down and could not urinate; states it made him lethargic as well.   Latex Itching   Penicillins Hives    Did it involve swelling of the face/tongue/throat, SOB, or low BP? Unknown Did it involve sudden or severe rash/hives, skin peeling, or any reaction on the inside of your mouth or nose? Unknown Did you need to seek medical attention  at a hospital or doctor's office? yes When did it last happen?      Childhood If all above answers are "NO", may proceed with cephalosporin use.    Review of Systems  Constitutional:  Positive for activity change. Negative for appetite change, chills, diaphoresis, fatigue, fever and unexpected weight change.  HENT: Negative.    Eyes: Negative.   Respiratory:  Negative for cough, chest tightness and shortness of breath.   Cardiovascular:  Negative for chest pain, palpitations and leg swelling.  Gastrointestinal:  Negative for abdominal pain, blood in stool, constipation, diarrhea, nausea and vomiting.  Endocrine: Negative.  Negative for polydipsia, polyphagia and polyuria.  Genitourinary:  Negative for decreased urine volume, difficulty urinating, dysuria, frequency and urgency.  Musculoskeletal:  Positive for arthralgias, back pain, gait problem and joint swelling. Negative for myalgias.  Skin: Negative.   Allergic/Immunologic: Negative.   Neurological:  Positive for numbness. Negative for dizziness, tremors, seizures, syncope, facial asymmetry, speech difficulty, weakness, light-headedness and headaches.  Hematological: Negative.   Psychiatric/Behavioral:  Negative for confusion, hallucinations, sleep disturbance and suicidal ideas.   All other  systems reviewed and are negative.       Objective:  BP 138/88   Pulse (!) 104   Temp 98.6 F (37 C)   Ht 6' (1.829 m)   Wt 215 lb (97.5 kg)   SpO2 97%   BMI 29.16 kg/m    Wt Readings from Last 3 Encounters:  10/03/21 215 lb (97.5 kg)  09/06/21 208 lb (94.3 kg)  11/28/20 220 lb (99.8 kg)    Physical Exam Vitals and nursing note reviewed.  Constitutional:      General: He is not in acute distress.    Appearance: He is obese. He is not ill-appearing, toxic-appearing or diaphoretic.  HENT:     Head: Normocephalic and atraumatic.     Mouth/Throat:     Mouth: Mucous membranes are moist.  Eyes:     Pupils: Pupils are equal, round, and reactive to light.  Cardiovascular:     Rate and Rhythm: Regular rhythm. Tachycardia present.     Heart sounds: Murmur heard.     Systolic murmur is present with a grade of 1/6.  Pulmonary:     Effort: Pulmonary effort is normal.     Breath sounds: Normal breath sounds.  Skin:    General: Skin is warm and dry.     Capillary Refill: Capillary refill takes less than 2 seconds.  Neurological:     General: No focal deficit present.     Mental Status: He is alert and oriented to person, place, and time.     Gait: Gait abnormal (antalgic, slow).  Psychiatric:        Mood and Affect: Mood normal.        Behavior: Behavior normal.        Thought Content: Thought content normal.        Judgment: Judgment normal.     Results for orders placed or performed in visit on 09/06/21  ANA  Result Value Ref Range   Anti Nuclear Antibody (ANA) POSITIVE (A) NEGATIVE  C3 and C4  Result Value Ref Range   C3 Complement 151 82 - 185 mg/dL   C4 Complement 52 15 - 53 mg/dL  Beta-2 glycoprotein antibodies  Result Value Ref Range   Beta-2 Glyco I IgG 4.8 <20.0 U/mL   Beta-2 Glyco 1 IgM 6.8 <20.0 U/mL   Beta-2 Glyco 1 IgA 60.8 (H) <20.0 U/mL  Cardiolipin  antibodies, IgG, IgM, IgA  Result Value Ref Range   Anticardiolipin IgA 44.1 (H) <20.0 APL-U/mL    Anticardiolipin IgG 3.2 <20.0 GPL-U/mL   Anticardiolipin IgM 2.5 <20.0 MPL-U/mL  Anti-nuclear ab-titer (ANA titer)  Result Value Ref Range   ANA Titer 1 1:80 (H) titer   ANA Pattern 1 Nuclear, Speckled (A)    ANA TITER 1:40 (H) titer   ANA PATTERN Cytoplasmic (A)        Pertinent labs & imaging results that were available during my care of the patient were reviewed by me and considered in my medical decision making.  Assessment & Plan:  Avontae was seen today for new patient (initial visit).  Diagnoses and all orders for this visit:  Essential hypertension Pressure fairly controlled in office but patient tachycardic.  States his resting heart rate is in the 100s all the time.  Will increase beta-blocker dose to 25 mg daily. DASHDiet and exercise encouraged.  Labs pending. -     metoprolol succinate (TOPROL-XL) 25 MG 24 hr tablet; Take 1 tablet (25 mg total) by mouth daily. -     CMP14+EGFR -     CBC with Differential/Platelet -     Thyroid Panel With TSH  Hypercholesteremia Not on statin therapy.  Not fasting today.  We will check fasting labs at next visit and start therapy if warranted.  Stage 3a chronic kidney disease ([place]) Followed by nephrology on a regular basis.  Will obtain labs today. -     CMP14+EGFR -     CBC with Differential/Platelet -     VITAMIN D 25 Hydroxy (Vit-D Deficiency, Fractures)  Secondary hyperparathyroidism ([place]) We will check labs today.  Followed by specialist on a regular basis. -     CBC with Differential/Platelet -     Thyroid Panel With TSH  MGUS (monoclonal gammopathy of unknown significance) Followed by hematology on a regular basis.  Fatty liver We will check liver enzymes today and order ultrasound imaging if warranted.  Vitamin D deficiency Labs pending. Continue repletion therapy. If indicated, will change repletion dosage. Eat foods rich in Vit D including milk, orange juice, yogurt with vitamin D added, salmon or mackerel, canned  tuna fish, cereals with vitamin D added, and cod liver oil. Get out in the sun but make sure to wear at least SPF 30 sunscreen.  -     VITAMIN D 25 Hydroxy (Vit-D Deficiency, Fractures)  DDD (degenerative disc disease), lumbar Spine specialist on a regular basis.  Abdominal aortic aneurysm (AAA) 3.0 cm to 5.5 cm in diameter in male [facility]) Cardiac murmur Incidental finding on CT of thoracic spine slight cardiac murmur noted on exam today.  Will order echo to evaluate for structural heart disease. -     ECHOCARDIOGRAM COMPLETE; Future  Charcot Lelan Pons Tooth muscular atrophy Follow-up with specialist on a regular basis.    Continue all other maintenance medications.  Follow up plan: Return in about 3 months (around 01/03/2022), or if symptoms worsen or fail to improve, for CPE.   Continue healthy lifestyle choices, including diet (rich in fruits, vegetables, and lean proteins, and low in salt and simple carbohydrates) and exercise (at least 30 minutes of moderate physical activity daily).  Educational handout given for health maintenance  The above assessment and management plan was discussed with the patient. The patient verbalized understanding of and has agreed to the management plan. Patient is aware to call the clinic if they develop any new symptoms or if symptoms  persist or worsen. Patient is aware when to return to the clinic for a follow-up visit. Patient educated on when it is appropriate to go to the emergency department.   Monia Pouch, FNP-C [facility] [phone]

## 2021-10-04 ENCOUNTER — Inpatient Hospital Stay: Payer: Medicare Other

## 2021-10-04 LAB — CBC WITH DIFFERENTIAL/PLATELET
Basophils Absolute: 0 10*3/uL (ref 0.0–0.2)
Basos: 0 %
EOS (ABSOLUTE): 0.1 10*3/uL (ref 0.0–0.4)
Eos: 1 %
Hematocrit: 42.7 % (ref 37.5–51.0)
Hemoglobin: 14.5 g/dL (ref 13.0–17.7)
Immature Grans (Abs): 0 10*3/uL (ref 0.0–0.1)
Immature Granulocytes: 0 %
Lymphocytes Absolute: 1.4 10*3/uL (ref 0.7–3.1)
Lymphs: 11 %
MCH: 31.3 pg (ref 26.6–33.0)
MCHC: 34 g/dL (ref 31.5–35.7)
MCV: 92 fL (ref 79–97)
Monocytes Absolute: 0.1 10*3/uL (ref 0.1–0.9)
Monocytes: 1 %
Neutrophils Absolute: 10.2 10*3/uL — ABNORMAL HIGH (ref 1.4–7.0)
Neutrophils: 87 %
Platelets: 276 10*3/uL (ref 150–450)
RBC: 4.64 x10E6/uL (ref 4.14–5.80)
RDW: 13.1 % (ref 11.6–15.4)
WBC: 11.8 10*3/uL — ABNORMAL HIGH (ref 3.4–10.8)

## 2021-10-04 LAB — CMP14+EGFR
ALT: 14 IU/L (ref 0–44)
AST: 16 IU/L (ref 0–40)
Albumin/Globulin Ratio: 1.6 (ref 1.2–2.2)
Albumin: 4.3 g/dL (ref 3.9–4.9)
Alkaline Phosphatase: 120 IU/L (ref 44–121)
BUN/Creatinine Ratio: 11 (ref 10–24)
BUN: 15 mg/dL (ref 8–27)
Bilirubin Total: 0.4 mg/dL (ref 0.0–1.2)
CO2: 22 mmol/L (ref 20–29)
Calcium: 10.2 mg/dL (ref 8.6–10.2)
Chloride: 100 mmol/L (ref 96–106)
Creatinine, Ser: 1.39 mg/dL — ABNORMAL HIGH (ref 0.76–1.27)
Globulin, Total: 2.7 g/dL (ref 1.5–4.5)
Glucose: 138 mg/dL — ABNORMAL HIGH (ref 70–99)
Potassium: 4.6 mmol/L (ref 3.5–5.2)
Sodium: 138 mmol/L (ref 134–144)
Total Protein: 7 g/dL (ref 6.0–8.5)
eGFR: 56 mL/min/{1.73_m2} — ABNORMAL LOW (ref >59)

## 2021-10-04 LAB — THYROID PANEL WITH TSH
Free Thyroxine Index: 2 (ref 1.2–4.9)
T3 Uptake Ratio: 27 % (ref 24–39)
T4, Total: 7.4 ug/dL (ref 4.5–12.0)
TSH: 3.09 u[IU]/mL (ref 0.450–4.500)

## 2021-10-04 LAB — VITAMIN D 25 HYDROXY (VIT D DEFICIENCY, FRACTURES): Vit D, 25-Hydroxy: 31.3 ng/mL (ref 30.0–100.0)

## 2021-10-07 ENCOUNTER — Other Ambulatory Visit: Payer: Self-pay

## 2021-10-07 ENCOUNTER — Other Ambulatory Visit: Payer: Self-pay | Admitting: Physician Assistant

## 2021-10-07 ENCOUNTER — Inpatient Hospital Stay: Payer: Medicare Other | Attending: Hematology

## 2021-10-07 DIAGNOSIS — Z79899 Other long term (current) drug therapy: Secondary | ICD-10-CM | POA: Diagnosis not present

## 2021-10-07 DIAGNOSIS — D72829 Elevated white blood cell count, unspecified: Secondary | ICD-10-CM

## 2021-10-07 DIAGNOSIS — D469 Myelodysplastic syndrome, unspecified: Secondary | ICD-10-CM | POA: Insufficient documentation

## 2021-10-07 DIAGNOSIS — D472 Monoclonal gammopathy: Secondary | ICD-10-CM

## 2021-10-07 LAB — CBC WITH DIFFERENTIAL/PLATELET
Abs Immature Granulocytes: 0.21 10*3/uL — ABNORMAL HIGH (ref 0.00–0.07)
Basophils Absolute: 0.1 10*3/uL (ref 0.0–0.1)
Basophils Relative: 0 %
Eosinophils Absolute: 0 10*3/uL (ref 0.0–0.5)
Eosinophils Relative: 0 %
HCT: 43.3 % (ref 39.0–52.0)
Hemoglobin: 14.6 g/dL (ref 13.0–17.0)
Immature Granulocytes: 1 %
Lymphocytes Relative: 20 %
Lymphs Abs: 3.6 10*3/uL (ref 0.7–4.0)
MCH: 31.5 pg (ref 26.0–34.0)
MCHC: 33.7 g/dL (ref 30.0–36.0)
MCV: 93.3 fL (ref 80.0–100.0)
Monocytes Absolute: 1.2 10*3/uL — ABNORMAL HIGH (ref 0.1–1.0)
Monocytes Relative: 7 %
Neutro Abs: 12.7 10*3/uL — ABNORMAL HIGH (ref 1.7–7.7)
Neutrophils Relative %: 72 %
Platelets: 282 10*3/uL (ref 150–400)
RBC: 4.64 MIL/uL (ref 4.22–5.81)
RDW: 12.8 % (ref 11.5–15.5)
WBC: 17.8 10*3/uL — ABNORMAL HIGH (ref 4.0–10.5)
nRBC: 0 % (ref 0.0–0.2)

## 2021-10-07 LAB — COMPREHENSIVE METABOLIC PANEL
ALT: 22 U/L (ref 0–44)
AST: 18 U/L (ref 15–41)
Albumin: 4 g/dL (ref 3.5–5.0)
Alkaline Phosphatase: 95 U/L (ref 38–126)
Anion gap: 8 (ref 5–15)
BUN: 24 mg/dL — ABNORMAL HIGH (ref 8–23)
CO2: 25 mmol/L (ref 22–32)
Calcium: 8.9 mg/dL (ref 8.9–10.3)
Chloride: 104 mmol/L (ref 98–111)
Creatinine, Ser: 1.41 mg/dL — ABNORMAL HIGH (ref 0.61–1.24)
GFR, Estimated: 55 mL/min — ABNORMAL LOW (ref >60)
Glucose, Bld: 83 mg/dL (ref 70–99)
Potassium: 4.1 mmol/L (ref 3.5–5.1)
Sodium: 137 mmol/L (ref 135–145)
Total Bilirubin: 0.5 mg/dL (ref 0.3–1.2)
Total Protein: 7.3 g/dL (ref 6.5–8.1)

## 2021-10-07 LAB — LACTATE DEHYDROGENASE: LDH: 126 U/L (ref 98–192)

## 2021-10-08 ENCOUNTER — Other Ambulatory Visit (HOSPITAL_COMMUNITY)
Admission: RE | Admit: 2021-10-08 | Discharge: 2021-10-08 | Disposition: A | Discharge status: Home / Self Care | Payer: Medicare Other | Source: Ambulatory Visit | Attending: Physician Assistant | Admitting: Physician Assistant

## 2021-10-08 DIAGNOSIS — D72829 Elevated white blood cell count, unspecified: Secondary | ICD-10-CM | POA: Insufficient documentation

## 2021-10-08 LAB — KAPPA/LAMBDA LIGHT CHAINS
Kappa free light chain: 23.8 mg/L — ABNORMAL HIGH (ref 3.3–19.4)
Kappa, lambda light chain ratio: 1.15 (ref 0.26–1.65)
Lambda free light chains: 20.7 mg/L (ref 5.7–26.3)

## 2021-10-08 LAB — BETA 2 MICROGLOBULIN, SERUM: Beta-2 Microglobulin: 2.6 mg/L — ABNORMAL HIGH (ref 0.6–2.4)

## 2021-10-10 ENCOUNTER — Inpatient Hospital Stay: Payer: Medicare Other | Admitting: Physician Assistant

## 2021-10-10 LAB — PROTEIN ELECTROPHORESIS, SERUM
A/G Ratio: 1.1 (ref 0.7–1.7)
Albumin ELP: 3.6 g/dL (ref 2.9–4.4)
Alpha-1-Globulin: 0.2 g/dL (ref 0.0–0.4)
Alpha-2-Globulin: 0.8 g/dL (ref 0.4–1.0)
Beta Globulin: 1.2 g/dL (ref 0.7–1.3)
Gamma Globulin: 1.1 g/dL (ref 0.4–1.8)
Globulin, Total: 3.3 g/dL (ref 2.2–3.9)
M-Spike, %: 0.3 g/dL — ABNORMAL HIGH
Total Protein ELP: 6.9 g/dL (ref 6.0–8.5)

## 2021-10-10 LAB — BCR-ABL1 FISH
Cells Analyzed: 200
Cells Counted: 200

## 2021-10-11 LAB — UPEP/UIFE/LIGHT CHAINS/TP, 24-HR UR
% BETA, Urine: 0 %
ALPHA 1 URINE: 0 %
Albumin, U: 100 %
Alpha 2, Urine: 0 %
Free Kappa Lt Chains,Ur: 17.42 mg/L (ref 1.17–86.46)
Free Kappa/Lambda Ratio: 7.41 (ref 1.83–14.26)
Free Lambda Lt Chains,Ur: 2.35 mg/L (ref 0.27–15.21)
GAMMA GLOBULIN URINE: 0 %
Total Protein, Urine-Ur/day: 373 mg/24 hr — ABNORMAL HIGH (ref 30–150)
Total Protein, Urine: 14.9 mg/dL
Total Volume: 2500

## 2021-10-11 LAB — IMMUNOFIXATION ELECTROPHORESIS
IgA: 242 mg/dL (ref 61–437)
IgG (Immunoglobin G), Serum: 1088 mg/dL (ref 603–1613)
IgM (Immunoglobulin M), Srm: 82 mg/dL (ref 20–172)
Total Protein ELP: 6.9 g/dL (ref 6.0–8.5)

## 2021-10-11 LAB — CALR +MPL + E12-E15  (REFLEX)

## 2021-10-11 LAB — JAK2 V617F RFX CALR/MPL/E12-15

## 2021-10-16 ENCOUNTER — Inpatient Hospital Stay: Payer: Medicare Other | Attending: Physician Assistant | Admitting: Physician Assistant

## 2021-10-16 DIAGNOSIS — D472 Monoclonal gammopathy: Secondary | ICD-10-CM

## 2021-10-16 DIAGNOSIS — R760 Raised antibody titer: Secondary | ICD-10-CM

## 2021-10-16 DIAGNOSIS — D72829 Elevated white blood cell count, unspecified: Secondary | ICD-10-CM | POA: Diagnosis not present

## 2021-10-16 NOTE — Progress Notes (Addendum)
Virtual Visit via Telephone Note [facility]  I connected with Juan Yang  on 10/16/2021 at 11:58 AM by telephone and verified that I am speaking with the correct person using two identifiers.  Location: Patient: Home Provider: [facility]   I discussed the limitations, risks, security and privacy concerns of performing an evaluation and management service by telephone and the availability of in person appointments. I also discussed with the patient that there may be a patient responsible charge related to this service. The patient expressed understanding and agreed to proceed.  REASON FOR VISIT:  Follow-up for MGUS, leukocytosis, and abnormal coagulopathy labs   PRIOR THERAPY: None   CURRENT THERAPY: Observation  INTERVAL HISTORY: Juan Yang is contacted today for follow-up of his MGUS, leukocytosis, and abnormal coagulopathy labs.  He was last evaluated via telemedicine visit by Tarri Abernethy PA-C on 05/22/2021.  Follow-up visit was moved forward to today's date per request of neurosurgeon for evaluation of MGUS prior to back surgery.  At today's visit, he reports feeling somewhat poorly in light of his chronic health conditions.  No recent hospitalizations, surgeries, or changes in baseline health status.   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  He believes that his symptoms are slowly getting worse.  He continues to follow-up with rheumatology and neurosurgery.   He denies any new bone pain or recent fractures.  He denies any B symptoms such as fever, chills, night sweats, unintentional weight loss.   No new neurologic symptoms such as tinnitus, new-onset hearing loss, blurred vision, headache, or dizziness.  He has chronic neuropathy and ataxia as noted above.   No thromboembolic events since his last visit.  No new  masses or lymphadenopathy per his report.   He has 40% energy and 40% appetite. He has lost about 10 pounds in the past 2 months from decreased appetite and watching what he eats.    OBSERVATIONS/OBJECTIVE: Review of Systems  Constitutional:  Negative for chills, diaphoresis, fever, malaise/fatigue and weight loss.  Respiratory:  Positive for shortness of breath. Negative for cough.   Cardiovascular:  Positive for palpitations. Negative for chest pain.  Gastrointestinal:  Positive for nausea. Negative for abdominal pain, blood in stool, melena and vomiting.  Musculoskeletal:  Positive for back pain and joint pain.  Neurological:  Positive for dizziness, tingling and headaches.  Psychiatric/Behavioral:  The patient has insomnia.      PHYSICAL EXAM (per limitations of virtual telephone visit): The patient is alert and oriented x 3, exhibiting adequate mentation, good mood, and ability to speak in full sentences and execute sound judgement.   ASSESSMENT & PLAN: 1.  MGUS, IgG lambda - Work-up by rheumatology (April 2022) showed faint monoclonal gammopathy: SPEP with a poorly-defined band of restricted protein mobility in the gammaglobulin region, that may represent a monoclonal protein.  IFE detected faint monoclonal free lambda light chain. - MGUS/myeloma panel completed on 05/30/20 by hematology clinic was unremarkable with negative SPEP and negative immunofixation - Skeletal survey (05/30/2020) no lytic lesion, prior sclerotic lesion of right tibia once again noted - Repeat MGUS/myeloma panel (04/11/2021): SPEP positive for M spike 0.2, immunofixation with IgG lambda monoclonal protein.  Elevated kappa light chains 37.2, elevated lambda 29.0, with normal ratio 1.28.  LDH normal at 162.  No CRAB features - Hgb 15.2, stable creatinine 1.39, calcium 9.5. - Most recent MGUS/myeloma panel (  10/07/2021) Immunofixation shows IgG lambda monoclonal protein SPEP with M spike 0.3 (relatively stable) Mildly  elevated kappa free light chains 23.8 with lambda free light chains 20.7 and normal ratio 1.15. Normal Hgb 14.6.  Baseline creatinine 1.41/GFR 55. Normal LDH.  Mildly elevated beta-2 microglobulin 2.6. 24-hour urine with UPEP/UIFE/TP: Non-nephrotic range proteinuria (375 mg), negative M spike.  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 - Patient is due for skeletal survey. - We will repeat MGUS/myeloma panel in 6 months.   2.  Positive for IgA anticardiolipin antibody and IgA antibeta-2 glycoprotein 1 antibody - Initial panel drawn on 05/11/2020:  IgA anticardiolipin antibody (60.2) and IgA antibeta-2 glycoprotein 1 antibody (> 65.0) - Repeat panel drawn on 10/09/2020: IgA anticardiolipin antibodies with normal (< 9), but IgM anticardiolipin antibody is now mildly elevated (19); similarly, IgA anticipated to glycoprotein 1 antibody is now normal (16), but IgM beta-2 glycoprotein 1 antibody is positive (88). - Patient denies any previous history of clotting with DVT or PE.  He does have a history of questionable TIAs x 3 -There is no family history of blood clots, although his mother did have 1 miscarriage. - Due to inconsistent elevation in antibodies, do not suspect any true coagulopathy in this patient.   - PLAN:  No indication for further intervention or work-up at this time.   3.   Leukocytosis - Patient has had persistent leukocytosis since August 2021, intermittently elevated neutrophils, eosinophils, and basophils - JAK2 with reflex to CALR and MPL was negative.  BCR/ABL FISH negative. - Likely reactive related to rheumatologic process (ANA positive, undergoing  rheumatology work-up for possible vasculitis) - May also be reactive related to vaping   - No B symptoms apart from fatigue     - Most recent CBC (10/07/2021) showed WBC 17.8 with elevated ANC 12.7 and monocytes 1.2  (was on Medrol DosePak for back pain at the time of lab draw)  - PLAN:  Likely reactive, but we will continue to monitor with CBC every 6 months.  If any major deviations from baseline, would consider bone marrow biopsy.   FOLLOW UP INSTRUCTIONS: Skeletal survey this week Labs in 6 months Office visit 1 week after labs    I discussed the assessment and treatment plan with the patient. The patient was provided an opportunity to ask questions and all were answered. The patient agreed with the plan and demonstrated an understanding of the instructions.   The patient was advised to call back or seek an in-person evaluation if the symptoms worsen or if the condition fails to improve as anticipated.  I provided 18 minutes of non-face-to-face time during this encounter.   Harriett Rush, PA-C 10/16/2021 1:27 PM

## 2021-10-25 ENCOUNTER — Ambulatory Visit (HOSPITAL_COMMUNITY)
Admission: RE | Admit: 2021-10-25 | Discharge: 2021-10-25 | Disposition: A | Discharge status: Home / Self Care | Payer: Medicare Other | Source: Ambulatory Visit | Attending: Physician Assistant | Admitting: Physician Assistant

## 2021-10-25 DIAGNOSIS — D472 Monoclonal gammopathy: Secondary | ICD-10-CM

## 2021-10-28 NOTE — Progress Notes (Unsigned)
Ray with [facility] radiology called with report from patients DG Bone Survey Met. IMPRESSION: 1. Can not exclude new small lucent lesion in the lateral right femoral head measuring 14 mm. Printed results and given to Tarri Abernethy, [place] and she is aware. No further instructions given at this time.

## 2021-10-30 ENCOUNTER — Telehealth: Payer: Self-pay | Admitting: Physician Assistant

## 2021-10-30 DIAGNOSIS — D472 Monoclonal gammopathy: Secondary | ICD-10-CM

## 2021-10-30 DIAGNOSIS — M899 Disorder of bone, unspecified: Secondary | ICD-10-CM

## 2021-10-31 NOTE — Telephone Encounter (Signed)
I contacted Mr. Juan Yang via telephone this afternoon to discuss results of his skeletal survey.  We discussed findings of possible lytic lesion, which could indicate presence of myeloma.  We discussed that this needs further work-up via PET scan.  We will schedule this within the next month and see the patient for office visit 1 week after PET scan.  Patient verbalized understanding and agreement with the above recommendations.  Tarri Abernethy PA-C 10/31/2021 5:30 PM

## 2021-11-04 ENCOUNTER — Encounter (INDEPENDENT_AMBULATORY_CARE_PROVIDER_SITE_OTHER): Payer: Self-pay | Admitting: *Deleted

## 2021-11-04 ENCOUNTER — Ambulatory Visit (HOSPITAL_COMMUNITY): Admission: RE | Admit: 2021-11-04 | Payer: Medicare Other | Source: Ambulatory Visit

## 2021-11-07 ENCOUNTER — Ambulatory Visit (HOSPITAL_COMMUNITY)
Admission: RE | Admit: 2021-11-07 | Discharge: 2021-11-07 | Disposition: A | Discharge status: Home / Self Care | Payer: Medicare Other | Source: Ambulatory Visit | Attending: Physician Assistant | Admitting: Physician Assistant

## 2021-11-07 DIAGNOSIS — M899 Disorder of bone, unspecified: Secondary | ICD-10-CM | POA: Insufficient documentation

## 2021-11-07 DIAGNOSIS — I129 Hypertensive chronic kidney disease with stage 1 through stage 4 chronic kidney disease, or unspecified chronic kidney disease: Secondary | ICD-10-CM | POA: Diagnosis not present

## 2021-11-07 DIAGNOSIS — N1831 Chronic kidney disease, stage 3a: Secondary | ICD-10-CM | POA: Diagnosis not present

## 2021-11-07 DIAGNOSIS — C9000 Multiple myeloma not having achieved remission: Secondary | ICD-10-CM | POA: Diagnosis not present

## 2021-11-07 DIAGNOSIS — R809 Proteinuria, unspecified: Secondary | ICD-10-CM | POA: Diagnosis not present

## 2021-11-07 DIAGNOSIS — D472 Monoclonal gammopathy: Secondary | ICD-10-CM | POA: Insufficient documentation

## 2021-11-07 MED ORDER — FLUDEOXYGLUCOSE F - 18 (FDG) INJECTION
11.6200 | Freq: Once | INTRAVENOUS | Status: AC | PRN
  Administered 2021-11-07: 11.62 via INTRAVENOUS

## 2021-11-12 NOTE — Progress Notes (Unsigned)
Juan Yang, Juan Yang   CLINIC:  Medical Oncology/Hematology  PCP:  Juan Yang, Juan Yang [phone]   REASON FOR VISIT:  Follow-up for MGUS and new finding of right lung lymphadenopathy  PRIOR THERAPY: None  CURRENT THERAPY: Surveillance  INTERVAL HISTORY:  Juan Yang 66 y.o. male returns for routine follow-up of his MGUS.  He was last evaluated via telemedicine visit with Juan Abernethy PA-C on 10/16/2021.  He received skeletal survey on 10/25/2021 which showed new lucent lesion in the lateral right femoral head measuring 14 mm.  Therefore, PET scan was obtained on 11/07/2021 and he returns today to discuss results of PET scan.  At today's visit, he reports feeling ***.  No recent hospitalizations, surgeries, or changes in baseline health status.  *** B symptoms *** Voice changes *** Unexplained cough, hemoptysis, chest pain   He has ***% energy and ***% appetite. He endorses that he is maintaining a stable weight.    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   Charcot-Marie-Tooth disease type 2    CKD (chronic kidney disease)    Dyspnea    Guaiac + stool 10/29/2016   History of kidney stones    Hypertension    Peripheral neuropathy 10/29/2016   Transient cerebral ischemic attack, unspecified 05/10/2020   Past Surgical History:  Procedure Laterality Date   COLONOSCOPY WITH PROPOFOL N/A 11/14/2016   Procedure: COLONOSCOPY WITH PROPOFOL;  Surgeon: Rogene Houston, MD;  [facility];  Service: Endoscopy;  Laterality: N/A;  9:15   LUMBAR  LAMINECTOMY/DECOMPRESSION MICRODISCECTOMY Right 05/24/2019   Procedure: LAMINECTOMY AND FORAMINOTOMY LUMBAR FIVE- SACRAL ONE RIGHT;  Surgeon: Vallarie Mare, MD;  Location: [place];  Service: Neurosurgery;  Laterality: Right;  posterior/right   MOHS SURGERY Right 05/2020   ear   MOHS SURGERY     POLYPECTOMY  11/14/2016   Procedure: POLYPECTOMY- SIGMOID COLON X3 TRANSVERSE COLON X2;  Surgeon: Rogene Houston, MD;  [facility];  Service: Endoscopy;;   rt ankle surgery     Titanium hinge   TONSILLECTOMY     TRANSFORAMINAL LUMBAR INTERBODY FUSION (TLIF) WITH PEDICLE SCREW FIXATION 1 LEVEL N/A 05/02/2020   Procedure: Lumbar five Sacral one Transforaminal lumbar interbody fusion;  Surgeon: Vallarie Mare, MD;  Location: [place];  Service: Neurosurgery;  Laterality: N/A;     SOCIAL HISTORY:  Social History   Socioeconomic History   Marital status: Married    Spouse name: Not on file   Number of children: Not on file   Years of education: Not on file   Highest education level: Not on file  Occupational History   Not on file  Tobacco Use   Smoking status: Former    Packs/day: 1.50    Years: 20.00    Total pack years: 30.00    Types: Cigarettes    Quit date: 11/12/2012    Years since quitting: 9.0    Passive exposure: Never   Smokeless tobacco: Never  Vaping Use   Vaping  Use: Some days  Substance and Sexual Activity   Alcohol use: No   Drug use: No   Sexual activity: Yes    Birth control/protection: None  Other Topics Concern   Not on file  Social History Narrative   Right handed   Lives with wife in one story home   Social Determinants of Health   Financial Resource Strain: Not on file  Food Insecurity: Not on file  Transportation Needs: No Transportation Needs (05/30/2020)   PRAPARE - Transportation    Lack of Transportation (Medical): No    Lack of Transportation (Non-Medical): No  Physical Activity: Inactive (05/30/2020)   Exercise Vital Sign    Days  of Exercise per Week: 0 days    Minutes of Exercise per Session: 0 min  Stress: Not on file  Social Connections: Not on file  Intimate Partner Violence: Not At Risk (05/30/2020)   Humiliation, Afraid, Rape, and Kick questionnaire    Fear of Current or Ex-Partner: No    Emotionally Abused: No    Physically Abused: No    Sexually Abused: No    FAMILY HISTORY:  Family History  Problem Relation Age of Onset   Charcot-Marie-Tooth disease Mother    Heart disease Father    COPD Father    Arthritis Sister     CURRENT MEDICATIONS:  Outpatient Encounter Medications as of 11/13/2021  Medication Sig   amLODipine (NORVASC) 2.5 MG tablet Take 2.5 mg by mouth 2 (two) times daily.   aspirin EC (ADULT ASPIRIN REGIMEN) 81 MG tablet    Ca Carbonate-Mag Hydroxide (ROLAIDS PO) Take 2-3 tablets by mouth daily as needed (heartburn).   clobetasol cream (TEMOVATE) 2.54 % Apply 1 application topically 2 (two) times daily as needed (rash).   cyclobenzaprine (FLEXERIL) 10 MG tablet Take 1 tablet (10 mg total) by mouth 3 (three) times daily as needed for muscle spasms.   Glucosamine HCl-MSM (GLUCOSAMINE-MSM PO) Take 1 tablet by mouth daily.   losartan (COZAAR) 25 MG tablet Take 25 mg by mouth daily.   methylPREDNISolone (MEDROL DOSEPAK) 4 MG TBPK tablet Take by mouth.   metoprolol succinate (TOPROL-XL) 25 MG 24 hr tablet Take 1 tablet (25 mg total) by mouth daily.   oxyCODONE-acetaminophen (PERCOCET/ROXICET) 5-325 MG tablet Take 1-2 tablets by mouth every 6 (six) hours as needed for moderate pain.   Potassium 99 MG TABS Take 99 mg by mouth as needed.   sildenafil (VIAGRA) 100 MG tablet Take 100 mg by mouth daily as needed for erectile dysfunction.   sodium bicarbonate 650 MG tablet Take 650 mg by mouth 2 (two) times daily.   No facility-administered encounter medications on file as of 11/13/2021.    ALLERGIES:  Allergies  Allergen Reactions   Other Hives    DUCK EGGS ONLY   Atenolol Diarrhea and Nausea  Only   Cymbalta [Duloxetine Hcl] Other (See Comments)    Pt states it shut his bladder down and could not urinate; states it made him lethargic as well.   Latex Itching   Penicillins Hives    Did it involve swelling of the face/tongue/throat, SOB, or low BP? Unknown Did it involve sudden or severe rash/hives, skin peeling, or any reaction on the inside of your mouth or nose? Unknown Did you need to seek medical attention at a hospital or doctor's office? yes When did it last happen?      Childhood If all above answers are "NO", may proceed with cephalosporin use.     PHYSICAL  EXAM: *** ECOG PERFORMANCE STATUS: {CHL ONC ECOG [phone]}  There were no vitals filed for this visit. There were no vitals filed for this visit. Physical Exam   LABORATORY DATA:  I have reviewed the labs as listed.  CBC    Component Value Date/Time   WBC 17.8 (H) 10/07/2021 0904   RBC 4.64 10/07/2021 0904   HGB 14.6 10/07/2021 0904   HGB 14.5 10/03/2021 1445   HCT 43.3 10/07/2021 0904   HCT 42.7 10/03/2021 1445   PLT 282 10/07/2021 0904   PLT 276 10/03/2021 1445   MCV 93.3 10/07/2021 0904   MCV 92 10/03/2021 1445   MCH 31.5 10/07/2021 0904   MCHC 33.7 10/07/2021 0904   RDW 12.8 10/07/2021 0904   RDW 13.1 10/03/2021 1445   LYMPHSABS 3.6 10/07/2021 0904   LYMPHSABS 1.4 10/03/2021 1445   MONOABS 1.2 (H) 10/07/2021 0904   EOSABS 0.0 10/07/2021 0904   EOSABS 0.1 10/03/2021 1445   BASOSABS 0.1 10/07/2021 0904   BASOSABS 0.0 10/03/2021 1445      Latest Ref Rng & Units 10/07/2021    9:04 AM 10/03/2021    2:45 PM 04/11/2021    9:52 AM  CMP  Glucose 70 - 99 mg/dL 83  138  111   BUN 8 - 23 mg/dL 24  15  12    Creatinine 0.61 - 1.24 mg/dL 1.41  1.39  1.39   Sodium 135 - 145 mmol/L 137  138  139   Potassium 3.5 - 5.1 mmol/L 4.1  4.6  3.1   Chloride 98 - 111 mmol/L 104  100  104   CO2 22 - 32 mmol/L 25  22  24    Calcium 8.9 - 10.3 mg/dL 8.9  10.2  9.5   Total Protein 6.5 - 8.1 g/dL 7.3  7.0  7.5    Total Bilirubin 0.3 - 1.2 mg/dL 0.5  0.4  0.5   Alkaline Phos 38 - 126 U/L 95  120  118   AST 15 - 41 U/L 18  16  17    ALT 0 - 44 U/L 22  14  13      DIAGNOSTIC IMAGING:  I have independently reviewed the relevant imaging and discussed with the patient.  ASSESSMENT & PLAN: 1.  Right hilar lymphadenopathy versus mass  - PET scan obtained 11/07/2021 (original indication was due to lucent lesion in the setting of MGUS) revealed tracer-avid right hilar lymph node or mass, which is concerning for malignancy.  Primary differential considerations include metastatic adenopathy versus primary bronchogenic carcinoma.  Small right infrahilar lymph node or nodule is also identified and is FDG avid. - *** - Smoking ***, family history *** - PLAN: CT chest with and without contrast *** - ***  2.  MGUS, IgG lambda - Work-up by rheumatology (April 2022) showed faint monoclonal gammopathy: SPEP with a poorly-defined band of restricted protein mobility in the gammaglobulin region, that may represent a monoclonal protein.  IFE detected faint monoclonal free lambda light chain. - MGUS/myeloma panel completed on 05/30/20 by hematology clinic was unremarkable with negative SPEP and negative immunofixation - Skeletal survey (05/30/2020) no lytic lesion, prior sclerotic lesion of right tibia once again noted - Repeat MGUS/myeloma panel (04/11/2021): SPEP positive for M spike 0.2, immunofixation with IgG lambda monoclonal protein.  Elevated kappa light chains 37.2, elevated lambda 29.0, with normal ratio 1.28.  LDH normal at 162.  No CRAB features - Hgb 15.2, stable creatinine 1.39, calcium 9.5. - Most recent MGUS/myeloma panel (  10/07/2021) Immunofixation shows IgG lambda monoclonal protein SPEP with M spike 0.3 (relatively stable) Mildly elevated kappa free light chains 23.8 with lambda free light chains 20.7 and normal ratio 1.15. Normal Hgb 14.6.  Baseline creatinine 1.41/GFR 55. Normal LDH.  Mildly elevated beta-2  microglobulin 2.6. 24-hour urine with UPEP/UIFE/TP: Non-nephrotic range proteinuria (375 mg), negative M spike.  - Skeletal survey (10/25/2021): New small lucent lesion in lateral right femoral head measuring 14 mm - PET scan (11/07/2021): Small ill-defined lucent lesion within the posterior aspect of right medial femoral condyle which is FDG avid and indeterminate.  This would be an unusual location for lung cancer metastases in the absence of more proximal lesions.  This is favored to be arthropathic in etiology.  A lesion of multiple myeloma is considered less favored.  Additional findings described above. 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 - Patient is due for skeletal survey. - We will repeat MGUS/myeloma panel in 6 months, along with repeat skeletal survey - *** No bone marrow biopsy yet, right??  ***  3.  Positive for IgA anticardiolipin antibody and IgA antibeta-2 glycoprotein 1 antibody - Initial panel drawn on 05/11/2020:  IgA anticardiolipin antibody (60.2) and IgA antibeta-2 glycoprotein 1 antibody (> 65.0) - Repeat panel drawn on 10/09/2020: IgA anticardiolipin antibodies with normal (< 9), but IgM anticardiolipin antibody is now mildly elevated (19); similarly, IgA anticipated to glycoprotein 1 antibody is now normal (16), but IgM beta-2 glycoprotein 1 antibody is positive (88). - Patient denies any previous history of clotting with DVT or PE.  He does have a history of questionable TIAs x 3 -There is no family  history of blood clots, although his mother did have 1 miscarriage. - Due to inconsistent elevation in antibodies, do not suspect any true coagulopathy in this patient.   - PLAN:  No indication for further intervention or work-up at this time.  4.  Leukocytosis - Patient has had persistent leukocytosis since August 2021, intermittently elevated neutrophils, eosinophils, and basophils - JAK2 with reflex to CALR and MPL was negative.  BCR/ABL FISH negative. - Likely reactive related to rheumatologic process (ANA positive, undergoing rheumatology work-up for possible vasculitis) - May also be reactive related to vaping   - No B symptoms apart from fatigue     - Most recent CBC (10/07/2021) showed WBC 17.8 with elevated ANC 12.7 and monocytes 1.2  (was on Medrol DosePak for back pain at the time of lab draw)  - PLAN:  Likely reactive, but we will continue to monitor with CBC every 6 months.  If any major deviations from baseline, would consider bone marrow biopsy.   PLAN SUMMARY & DISPOSITION: ***  All questions were answered. The patient knows to call the clinic with any problems, questions or concerns.  Medical decision making: ***  Time spent on visit: I spent *** minutes counseling the patient face to face. The total time spent in the appointment was *** minutes and more than 50% was on counseling.   Harriett Rush, PA-C  ***

## 2021-11-13 ENCOUNTER — Inpatient Hospital Stay: Payer: Medicare Other | Attending: Physician Assistant | Admitting: Physician Assistant

## 2021-11-13 ENCOUNTER — Encounter: Payer: Self-pay | Admitting: Physician Assistant

## 2021-11-13 VITALS — Wt 215.0 lb

## 2021-11-13 DIAGNOSIS — R918 Other nonspecific abnormal finding of lung field: Secondary | ICD-10-CM

## 2021-11-13 DIAGNOSIS — C3490 Malignant neoplasm of unspecified part of unspecified bronchus or lung: Secondary | ICD-10-CM

## 2021-11-21 ENCOUNTER — Other Ambulatory Visit: Payer: Medicare Other

## 2021-11-22 ENCOUNTER — Encounter: Payer: Self-pay | Admitting: Emergency Medicine

## 2021-11-22 ENCOUNTER — Ambulatory Visit: Payer: Medicare Other | Admitting: Emergency Medicine

## 2021-11-22 VITALS — BP 124/70 | HR 88 | Temp 98.2°F | Ht 72.0 in | Wt 205.4 lb

## 2021-11-22 DIAGNOSIS — R590 Localized enlarged lymph nodes: Secondary | ICD-10-CM | POA: Insufficient documentation

## 2021-11-22 NOTE — Assessment & Plan Note (Signed)
Hypermetabolic right hilar adenopathy of unclear significance but concerning given his history of MGUS, sclerotic bony lesions that are currently under evaluation.  Believe he needs tissue diagnosis and discussed with him today.  He agrees.  It looks like the larger right hilar node should be amenable to EBUS.  The more inferior and distal smaller node likely is not reachable.  Not clear to me that it is a good target for navigation.  We work on setting up EBUS for biopsies.  My schedule is booked out until November 6 and I would like to try to get him done sooner.  I will coordinate with Dr. Verlee Monte to try to expedite.

## 2021-11-22 NOTE — H&P (View-Only) (Signed)
Subjective:    Patient ID: Juan Yang, male    DOB: Jun 11, 1955, 66 y.o.   MRN: [id number]  HPI 66 year old man, former smoker (30 pack years) who has been followed at the [facility] for IgG lambda MGUS, persistent leukocytosis, anticardiolipin antibodies.  He also his Charcot-Marie-Tooth muscular atrophy, hypertension, history of basal cell carcinoma that was resected.  He underwent a PET scan to evaluate a lucency noted at the posterior aspect of the right medial femoral condyle.  This was FDG avid.  Also had hilar adenopathy as below.  He is referred to discuss possible bronchoscopy to evaluate the right hilar adenopathy.  He is doing well. Minimal cough. He has some pleuritic R costal margin pain.      PET scan 11/07/2021 reviewed by me shows 2.6 x 2.4 right hilar lymphadenopathy versus mass that is hypermetabolic, 1.6 cm right infrahilar nodular lymph node that is hypermetabolic.  There is no left-sided activity noted.  I do not see any parenchymal disease or nodules..  Note was made of an ill-defined lucent lesion in the posterior aspect of the right medial femoral condyle that was indeterminate, felt to be more likely due to arthropathy as opposed to multiple myeloma.  There was also a sclerotic lesion in the distal right tibia with increased tracer uptake that was nonspecific.   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   Charcot-Marie-Tooth disease type 2    CKD (chronic kidney disease)    Dyspnea    Guaiac + stool 10/29/2016   History of kidney stones    Hypertension    Peripheral neuropathy 10/29/2016   Transient cerebral ischemic attack, unspecified 05/10/2020     Family History   Problem Relation Age of Onset   Charcot-Marie-Tooth disease Mother    Heart disease Father    COPD Father    Arthritis Sister      Social History   Socioeconomic History   Marital status: Married    Spouse name: Not on file   Number of children: Not on file   Years of education: Not on file   Highest education level: Not on file  Occupational History   Not on file  Tobacco Use   Smoking status: Former    Packs/day: 1.50    Years: 20.00    Total pack years: 30.00    Types: Cigarettes    Quit date: 11/12/2012    Years since quitting: 9.0    Passive exposure: Never   Smokeless tobacco: Never   Tobacco comments:    Pt smokes 1 pack a month and vapes. ARJ 11/22/21  Vaping Use   Vaping Use: Some days  Substance and Sexual Activity   Alcohol use: No   Drug use: No   Sexual activity: Yes    Birth control/protection: None  Other Topics Concern   Not on file  Social History Narrative   Right handed   Lives with wife in one story home   Social Determinants of Health   Financial Resource Strain: Not on file  Food Insecurity: Not on file  Transportation Needs: No Transportation Needs (05/30/2020)   PRAPARE -  Hydrologist (Medical): No    Lack of Transportation (Non-Medical): No  Physical Activity: Inactive (05/30/2020)   Exercise Vital Sign    Days of Exercise per Week: 0 days    Minutes of Exercise per Session: 0 min  Stress: Not on file  Social Connections: Not on file  Intimate Partner Violence: Not At Risk (05/30/2020)   Humiliation, Afraid, Rape, and Kick questionnaire    Fear of Current or Ex-Partner: No    Emotionally Abused: No    Physically Abused: No    Sexually Abused: No     Allergies  Allergen Reactions   Other Hives    DUCK EGGS ONLY   Atenolol Diarrhea and Nausea Only   Cymbalta [Duloxetine Hcl] Other (See Comments)    Pt states it shut his bladder down and could not urinate; states it made him lethargic as well.    Latex Itching   Penicillins Hives    Did it involve swelling of the face/tongue/throat, SOB, or low BP? Unknown Did it involve sudden or severe rash/hives, skin peeling, or any reaction on the inside of your mouth or nose? Unknown Did you need to seek medical attention at a hospital or doctor's office? yes When did it last happen?      Childhood If all above answers are "NO", may proceed with cephalosporin use.     Outpatient Medications Prior to Visit  Medication Sig Dispense Refill   amLODipine (NORVASC) 2.5 MG tablet Take 2.5 mg by mouth 2 (two) times daily.     aspirin EC (ADULT ASPIRIN REGIMEN) 81 MG tablet      Ca Carbonate-Mag Hydroxide (ROLAIDS PO) Take 2-3 tablets by mouth daily as needed (heartburn).     clobetasol cream (TEMOVATE) 3.78 % Apply 1 application topically 2 (two) times daily as needed (rash).     cyclobenzaprine (FLEXERIL) 10 MG tablet Take 1 tablet (10 mg total) by mouth 3 (three) times daily as needed for muscle spasms. 30 tablet 0   Glucosamine HCl-MSM (GLUCOSAMINE-MSM PO) Take 1 tablet by mouth daily.     losartan (COZAAR) 25 MG tablet Take 25 mg by mouth daily.     metoprolol succinate (TOPROL-XL) 25 MG 24 hr tablet Take 1 tablet (25 mg total) by mouth daily. 90 tablet 3   oxyCODONE-acetaminophen (PERCOCET/ROXICET) 5-325 MG tablet Take 1-2 tablets by mouth every 6 (six) hours as needed for moderate pain. 40 tablet 0   Potassium 99 MG TABS Take 99 mg by mouth as needed.     sildenafil (VIAGRA) 100 MG tablet Take 100 mg by mouth daily as needed for erectile dysfunction.     sodium bicarbonate 650 MG tablet Take 650 mg by mouth 2 (two) times daily.     methylPREDNISolone (MEDROL DOSEPAK) 4 MG TBPK tablet Take by mouth.     No facility-administered medications prior to visit.         Objective:   Physical Exam Vitals:   11/22/21 1307  BP: 124/70  Pulse: 88  Temp: 98.2 F (36.8 C)  TempSrc: Oral  SpO2: 98%  Weight: 205 lb 6.4 oz (93.2 kg)  Height: 6'  (1.829 m)   Gen: Pleasant, well-nourished, in no distress,  normal affect  ENT: No lesions,  mouth clear,  oropharynx clear, no postnasal drip  Neck: No JVD, no stridor  Lungs: No use of accessory muscles, no crackles or wheezing on normal respiration, no wheeze on forced expiration  Cardiovascular: RRR, heart sounds  normal, no murmur or gallops, no peripheral edema  Musculoskeletal: No deformities, no cyanosis or clubbing  Neuro: alert, awake, non focal  Skin: Warm, no lesions or rash      Assessment & Plan:  Hilar adenopathy Hypermetabolic right hilar adenopathy of unclear significance but concerning given his history of MGUS, sclerotic bony lesions that are currently under evaluation.  Believe he needs tissue diagnosis and discussed with him today.  He agrees.  It looks like the larger right hilar node should be amenable to EBUS.  The more inferior and distal smaller node likely is not reachable.  Not clear to me that it is a good target for navigation.  We work on setting up EBUS for biopsies.  My schedule is booked out until November 6 and I would like to try to get him done sooner.  I will coordinate with Dr. Verlee Monte to try to expedite.  Baltazar Apo, MD, PhD 11/22/2021, 1:38 PM [facility] [phone] or if no answer before 7:00PM call [phone] For any issues after 7:00PM please call eLink [phone]

## 2021-11-22 NOTE — Addendum Note (Signed)
Addended by: Gavin Potters R on: 11/22/2021 02:18 PM   Modules accepted: Orders

## 2021-11-22 NOTE — Patient Instructions (Signed)
We will work on setting up bronchoscopy with endobronchial ultrasound to evaluate enlarged right sided lymph nodes.  This would be done under general anesthesia as an outpatient at [facility] endoscopy.  We will work on arranging for 11/29/2021 with Dr. Verlee Monte.  You need to stop your aspirin 2 days prior.  You will need a designated driver and someone to watch you at home after you wake up from anesthesia. Follow Dr. Lamonte Sakai or Dr. Verlee Monte in 1 month or next available after your procedure is done.

## 2021-11-22 NOTE — Progress Notes (Signed)
Subjective:    Patient ID: Juan Yang, male    DOB: 1955/12/07, 66 y.o.   MRN: [id number]  HPI 66 year old man, former smoker (30 pack years) who has been followed at the [facility] for IgG lambda MGUS, persistent leukocytosis, anticardiolipin antibodies.  He also his Charcot-Marie-Tooth muscular atrophy, hypertension, history of basal cell carcinoma that was resected.  He underwent a PET scan to evaluate a lucency noted at the posterior aspect of the right medial femoral condyle.  This was FDG avid.  Also had hilar adenopathy as below.  He is referred to discuss possible bronchoscopy to evaluate the right hilar adenopathy.  He is doing well. Minimal cough. He has some pleuritic R costal margin pain.      PET scan 11/07/2021 reviewed by me shows 2.6 x 2.4 right hilar lymphadenopathy versus mass that is hypermetabolic, 1.6 cm right infrahilar nodular lymph node that is hypermetabolic.  There is no left-sided activity noted.  I do not see any parenchymal disease or nodules..  Note was made of an ill-defined lucent lesion in the posterior aspect of the right medial femoral condyle that was indeterminate, felt to be more likely due to arthropathy as opposed to multiple myeloma.  There was also a sclerotic lesion in the distal right tibia with increased tracer uptake that was nonspecific.   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   Charcot-Marie-Tooth disease type 2    CKD (chronic kidney disease)    Dyspnea    Guaiac + stool 10/29/2016   History of kidney stones    Hypertension    Peripheral neuropathy 10/29/2016   Transient cerebral ischemic attack, unspecified 05/10/2020     Family History   Problem Relation Age of Onset   Charcot-Marie-Tooth disease Mother    Heart disease Father    COPD Father    Arthritis Sister      Social History   Socioeconomic History   Marital status: Married    Spouse name: Not on file   Number of children: Not on file   Years of education: Not on file   Highest education level: Not on file  Occupational History   Not on file  Tobacco Use   Smoking status: Former    Packs/day: 1.50    Years: 20.00    Total pack years: 30.00    Types: Cigarettes    Quit date: 11/12/2012    Years since quitting: 9.0    Passive exposure: Never   Smokeless tobacco: Never   Tobacco comments:    Pt smokes 1 pack a month and vapes. ARJ 11/22/21  Vaping Use   Vaping Use: Some days  Substance and Sexual Activity   Alcohol use: No   Drug use: No   Sexual activity: Yes    Birth control/protection: None  Other Topics Concern   Not on file  Social History Narrative   Right handed   Lives with wife in one story home   Social Determinants of Health   Financial Resource Strain: Not on file  Food Insecurity: Not on file  Transportation Needs: No Transportation Needs (05/30/2020)   PRAPARE -  Hydrologist (Medical): No    Lack of Transportation (Non-Medical): No  Physical Activity: Inactive (05/30/2020)   Exercise Vital Sign    Days of Exercise per Week: 0 days    Minutes of Exercise per Session: 0 min  Stress: Not on file  Social Connections: Not on file  Intimate Partner Violence: Not At Risk (05/30/2020)   Humiliation, Afraid, Rape, and Kick questionnaire    Fear of Current or Ex-Partner: No    Emotionally Abused: No    Physically Abused: No    Sexually Abused: No     Allergies  Allergen Reactions   Other Hives    DUCK EGGS ONLY   Atenolol Diarrhea and Nausea Only   Cymbalta [Duloxetine Hcl] Other (See Comments)    Pt states it shut his bladder down and could not urinate; states it made him lethargic as well.    Latex Itching   Penicillins Hives    Did it involve swelling of the face/tongue/throat, SOB, or low BP? Unknown Did it involve sudden or severe rash/hives, skin peeling, or any reaction on the inside of your mouth or nose? Unknown Did you need to seek medical attention at a hospital or doctor's office? yes When did it last happen?      Childhood If all above answers are "NO", may proceed with cephalosporin use.     Outpatient Medications Prior to Visit  Medication Sig Dispense Refill   amLODipine (NORVASC) 2.5 MG tablet Take 2.5 mg by mouth 2 (two) times daily.     aspirin EC (ADULT ASPIRIN REGIMEN) 81 MG tablet      Ca Carbonate-Mag Hydroxide (ROLAIDS PO) Take 2-3 tablets by mouth daily as needed (heartburn).     clobetasol cream (TEMOVATE) 8.32 % Apply 1 application topically 2 (two) times daily as needed (rash).     cyclobenzaprine (FLEXERIL) 10 MG tablet Take 1 tablet (10 mg total) by mouth 3 (three) times daily as needed for muscle spasms. 30 tablet 0   Glucosamine HCl-MSM (GLUCOSAMINE-MSM PO) Take 1 tablet by mouth daily.     losartan (COZAAR) 25 MG tablet Take 25 mg by mouth daily.     metoprolol succinate (TOPROL-XL) 25 MG 24 hr tablet Take 1 tablet (25 mg total) by mouth daily. 90 tablet 3   oxyCODONE-acetaminophen (PERCOCET/ROXICET) 5-325 MG tablet Take 1-2 tablets by mouth every 6 (six) hours as needed for moderate pain. 40 tablet 0   Potassium 99 MG TABS Take 99 mg by mouth as needed.     sildenafil (VIAGRA) 100 MG tablet Take 100 mg by mouth daily as needed for erectile dysfunction.     sodium bicarbonate 650 MG tablet Take 650 mg by mouth 2 (two) times daily.     methylPREDNISolone (MEDROL DOSEPAK) 4 MG TBPK tablet Take by mouth.     No facility-administered medications prior to visit.         Objective:   Physical Exam Vitals:   11/22/21 1307  BP: 124/70  Pulse: 88  Temp: 98.2 F (36.8 C)  TempSrc: Oral  SpO2: 98%  Weight: 205 lb 6.4 oz (93.2 kg)  Height: 6'  (1.829 m)   Gen: Pleasant, well-nourished, in no distress,  normal affect  ENT: No lesions,  mouth clear,  oropharynx clear, no postnasal drip  Neck: No JVD, no stridor  Lungs: No use of accessory muscles, no crackles or wheezing on normal respiration, no wheeze on forced expiration  Cardiovascular: RRR, heart sounds  normal, no murmur or gallops, no peripheral edema  Musculoskeletal: No deformities, no cyanosis or clubbing  Neuro: alert, awake, non focal  Skin: Warm, no lesions or rash      Assessment & Plan:  Hilar adenopathy Hypermetabolic right hilar adenopathy of unclear significance but concerning given his history of MGUS, sclerotic bony lesions that are currently under evaluation.  Believe he needs tissue diagnosis and discussed with him today.  He agrees.  It looks like the larger right hilar node should be amenable to EBUS.  The more inferior and distal smaller node likely is not reachable.  Not clear to me that it is a good target for navigation.  We work on setting up EBUS for biopsies.  My schedule is booked out until November 6 and I would like to try to get him done sooner.  I will coordinate with Dr. Verlee Monte to try to expedite.  Baltazar Apo, MD, PhD 11/22/2021, 1:38 PM [facility] [phone] or if no answer before 7:00PM call [phone] For any issues after 7:00PM please call eLink [phone]

## 2021-11-26 ENCOUNTER — Other Ambulatory Visit (HOSPITAL_COMMUNITY)
Admission: RE | Admit: 2021-11-26 | Discharge: 2021-11-26 | Disposition: A | Discharge status: Home / Self Care | Payer: Medicare Other | Source: Ambulatory Visit | Attending: Nephrology | Admitting: Nephrology

## 2021-11-26 ENCOUNTER — Other Ambulatory Visit: Payer: Medicare Other

## 2021-11-26 ENCOUNTER — Telehealth: Payer: Self-pay | Admitting: Student

## 2021-11-26 DIAGNOSIS — K760 Fatty (change of) liver, not elsewhere classified: Secondary | ICD-10-CM | POA: Insufficient documentation

## 2021-11-26 DIAGNOSIS — Z79899 Other long term (current) drug therapy: Secondary | ICD-10-CM | POA: Insufficient documentation

## 2021-11-26 DIAGNOSIS — N1831 Chronic kidney disease, stage 3a: Secondary | ICD-10-CM | POA: Diagnosis not present

## 2021-11-26 DIAGNOSIS — D472 Monoclonal gammopathy: Secondary | ICD-10-CM | POA: Insufficient documentation

## 2021-11-26 DIAGNOSIS — E876 Hypokalemia: Secondary | ICD-10-CM | POA: Insufficient documentation

## 2021-11-26 DIAGNOSIS — R911 Solitary pulmonary nodule: Secondary | ICD-10-CM

## 2021-11-26 DIAGNOSIS — I129 Hypertensive chronic kidney disease with stage 1 through stage 4 chronic kidney disease, or unspecified chronic kidney disease: Secondary | ICD-10-CM | POA: Diagnosis not present

## 2021-11-26 DIAGNOSIS — R590 Localized enlarged lymph nodes: Secondary | ICD-10-CM | POA: Diagnosis not present

## 2021-11-26 LAB — RENAL FUNCTION PANEL
Albumin: 3.9 g/dL (ref 3.5–5.0)
Anion gap: 7 (ref 5–15)
BUN: 12 mg/dL (ref 8–23)
CO2: 26 mmol/L (ref 22–32)
Calcium: 9 mg/dL (ref 8.9–10.3)
Chloride: 104 mmol/L (ref 98–111)
Creatinine, Ser: 1.33 mg/dL — ABNORMAL HIGH (ref 0.61–1.24)
GFR, Estimated: 59 mL/min — ABNORMAL LOW (ref >60)
Glucose, Bld: 84 mg/dL (ref 70–99)
Phosphorus: 2.5 mg/dL (ref 2.5–4.6)
Potassium: 3.7 mmol/L (ref 3.5–5.1)
Sodium: 137 mmol/L (ref 135–145)

## 2021-11-26 LAB — CBC WITH DIFFERENTIAL/PLATELET
Abs Immature Granulocytes: 0.02 10*3/uL (ref 0.00–0.07)
Basophils Absolute: 0.1 10*3/uL (ref 0.0–0.1)
Basophils Relative: 1 %
Eosinophils Absolute: 0.2 10*3/uL (ref 0.0–0.5)
Eosinophils Relative: 2 %
HCT: 42.1 % (ref 39.0–52.0)
Hemoglobin: 14.3 g/dL (ref 13.0–17.0)
Immature Granulocytes: 0 %
Lymphocytes Relative: 23 %
Lymphs Abs: 2.4 10*3/uL (ref 0.7–4.0)
MCH: 31 pg (ref 26.0–34.0)
MCHC: 34 g/dL (ref 30.0–36.0)
MCV: 91.3 fL (ref 80.0–100.0)
Monocytes Absolute: 0.6 10*3/uL (ref 0.1–1.0)
Monocytes Relative: 6 %
Neutro Abs: 6.9 10*3/uL (ref 1.7–7.7)
Neutrophils Relative %: 68 %
Platelets: 316 10*3/uL (ref 150–400)
RBC: 4.61 MIL/uL (ref 4.22–5.81)
RDW: 11.9 % (ref 11.5–15.5)
WBC: 10.3 10*3/uL (ref 4.0–10.5)
nRBC: 0 % (ref 0.0–0.2)

## 2021-11-26 NOTE — Telephone Encounter (Signed)
Juan Yang has added both procedures - no PA needed per [facility].  Pt to get CT that morning and she is working on Information systems manager for it.  Pt getting covid test today.  Nothing further needed on message.

## 2021-11-26 NOTE — Telephone Encounter (Signed)
Reviewed his PET/CT would it be too late to change this to robotic case with EBUS? He will need Super D CT scheduled before his bronch. Ideally today or tomorrow since I've got clinic on Friday at 9:30 unless the CT can be done super early on Friday.   Do you need updated order for bronch?  Sorry for late notice!

## 2021-11-27 ENCOUNTER — Encounter (HOSPITAL_COMMUNITY): Payer: Self-pay | Admitting: Student

## 2021-11-27 NOTE — Progress Notes (Signed)
Error

## 2021-11-28 ENCOUNTER — Other Ambulatory Visit: Payer: Self-pay

## 2021-11-28 ENCOUNTER — Encounter (HOSPITAL_COMMUNITY): Payer: Self-pay | Admitting: Student

## 2021-11-28 LAB — NOVEL CORONAVIRUS, NAA: SARS-CoV-2, NAA: NOT DETECTED

## 2021-11-28 LAB — SPECIMEN STATUS REPORT

## 2021-11-28 NOTE — Progress Notes (Addendum)
Mrs. Boydstun called, she is Mr. Inks designated party  release.Mrs.Turnbo reports that Mr. Ledvina has not complained of chest pain or shortness of breath. Patient denies having any s/s of Covid in her household, also denies any known exposure to Covid.   Mr. Cordner PCP is at [facility]. Patient saw Darla Lesches, [place], who heard a slight heart murmur, FNP ordered a ECHO, to be done 12/05/21.  Mr. Ernsberger complains of abdominal pain in the upper right side. Patient rates the pain as a 4 out of 10.  Patient denies fever o0r constipation, I encouraged patient to call his PCP.  Mr. Laverdiere has stage 3 kidney disease, he is followed by [place].  Mr. Melina Modena complained of pain of the upper right s abdomen, Karoline Caldwell, PA-C reviewed.

## 2021-11-29 ENCOUNTER — Ambulatory Visit (HOSPITAL_COMMUNITY): Payer: Medicare Other

## 2021-11-29 ENCOUNTER — Ambulatory Visit (HOSPITAL_COMMUNITY)
Admission: RE | Admit: 2021-11-29 | Discharge: 2021-11-29 | Disposition: A | Discharge status: Home / Self Care | Payer: Medicare Other | Attending: Student | Admitting: Student

## 2021-11-29 ENCOUNTER — Encounter (HOSPITAL_COMMUNITY)
Admission: RE | Disposition: A | Discharge status: Home / Self Care | Payer: Self-pay | Source: Home / Self Care | Attending: Student

## 2021-11-29 ENCOUNTER — Ambulatory Visit (HOSPITAL_BASED_OUTPATIENT_CLINIC_OR_DEPARTMENT_OTHER): Payer: Medicare Other | Admitting: Physician Assistant

## 2021-11-29 ENCOUNTER — Encounter (HOSPITAL_COMMUNITY): Payer: Self-pay | Admitting: Student

## 2021-11-29 ENCOUNTER — Ambulatory Visit (HOSPITAL_COMMUNITY): Payer: Medicare Other | Admitting: Physician Assistant

## 2021-11-29 ENCOUNTER — Ambulatory Visit: Payer: Medicare Other | Admitting: Physician Assistant

## 2021-11-29 DIAGNOSIS — A5216 Charcot's arthropathy (tabetic): Secondary | ICD-10-CM | POA: Insufficient documentation

## 2021-11-29 DIAGNOSIS — Z9889 Other specified postprocedural states: Secondary | ICD-10-CM | POA: Diagnosis not present

## 2021-11-29 DIAGNOSIS — Z87891 Personal history of nicotine dependence: Secondary | ICD-10-CM

## 2021-11-29 DIAGNOSIS — I700 Atherosclerosis of aorta: Secondary | ICD-10-CM | POA: Diagnosis not present

## 2021-11-29 DIAGNOSIS — J439 Emphysema, unspecified: Secondary | ICD-10-CM | POA: Diagnosis not present

## 2021-11-29 DIAGNOSIS — M6258 Muscle wasting and atrophy, not elsewhere classified, other site: Secondary | ICD-10-CM | POA: Diagnosis not present

## 2021-11-29 DIAGNOSIS — N289 Disorder of kidney and ureter, unspecified: Secondary | ICD-10-CM

## 2021-11-29 DIAGNOSIS — I129 Hypertensive chronic kidney disease with stage 1 through stage 4 chronic kidney disease, or unspecified chronic kidney disease: Secondary | ICD-10-CM | POA: Diagnosis not present

## 2021-11-29 DIAGNOSIS — I10 Essential (primary) hypertension: Secondary | ICD-10-CM | POA: Diagnosis not present

## 2021-11-29 DIAGNOSIS — R590 Localized enlarged lymph nodes: Secondary | ICD-10-CM | POA: Insufficient documentation

## 2021-11-29 DIAGNOSIS — F418 Other specified anxiety disorders: Secondary | ICD-10-CM | POA: Insufficient documentation

## 2021-11-29 DIAGNOSIS — N189 Chronic kidney disease, unspecified: Secondary | ICD-10-CM | POA: Diagnosis not present

## 2021-11-29 DIAGNOSIS — C771 Secondary and unspecified malignant neoplasm of intrathoracic lymph nodes: Secondary | ICD-10-CM | POA: Diagnosis not present

## 2021-11-29 DIAGNOSIS — S2241XA Multiple fractures of ribs, right side, initial encounter for closed fracture: Secondary | ICD-10-CM | POA: Diagnosis not present

## 2021-11-29 DIAGNOSIS — R918 Other nonspecific abnormal finding of lung field: Secondary | ICD-10-CM | POA: Diagnosis not present

## 2021-11-29 DIAGNOSIS — C801 Malignant (primary) neoplasm, unspecified: Secondary | ICD-10-CM | POA: Diagnosis not present

## 2021-11-29 DIAGNOSIS — D472 Monoclonal gammopathy: Secondary | ICD-10-CM | POA: Insufficient documentation

## 2021-11-29 HISTORY — DX: Cardiac murmur, unspecified: R01.1

## 2021-11-29 HISTORY — PX: BRONCHIAL NEEDLE ASPIRATION BIOPSY: SHX5106

## 2021-11-29 HISTORY — DX: Pneumonia, unspecified organism: J18.9

## 2021-11-29 HISTORY — PX: VIDEO BRONCHOSCOPY WITH ENDOBRONCHIAL ULTRASOUND: SHX6177

## 2021-11-29 SURGERY — BRONCHOSCOPY, WITH EBUS
Anesthesia: General

## 2021-11-29 MED ORDER — PROPOFOL 500 MG/50ML IV EMUL
INTRAVENOUS | Status: DC | PRN
  Administered 2021-11-29: 150 ug/kg/min via INTRAVENOUS

## 2021-11-29 MED ORDER — MIDAZOLAM HCL 2 MG/2ML IJ SOLN
INTRAMUSCULAR | Status: DC | PRN
  Administered 2021-11-29 (×2): 1 mg via INTRAVENOUS

## 2021-11-29 MED ORDER — EPHEDRINE SULFATE-NACL 50-0.9 MG/10ML-% IV SOSY
PREFILLED_SYRINGE | INTRAVENOUS | Status: DC | PRN
  Administered 2021-11-29 (×3): 5 mg via INTRAVENOUS

## 2021-11-29 MED ORDER — PHENYLEPHRINE HCL-NACL 20-0.9 MG/250ML-% IV SOLN
INTRAVENOUS | Status: DC | PRN
  Administered 2021-11-29: 75 ug/min via INTRAVENOUS

## 2021-11-29 MED ORDER — ACETAMINOPHEN 10 MG/ML IV SOLN: 1000.0000 mg | Freq: Once | INTRAVENOUS | Status: DC | PRN

## 2021-11-29 MED ORDER — ONDANSETRON HCL 4 MG/2ML IJ SOLN
INTRAMUSCULAR | Status: DC | PRN
  Administered 2021-11-29: 4 mg via INTRAVENOUS

## 2021-11-29 MED ORDER — CHLORHEXIDINE GLUCONATE 0.12 % MT SOLN
OROMUCOSAL | Status: AC
  Administered 2021-11-29: 15 mL via OROMUCOSAL
  Filled 2021-11-29: qty 15

## 2021-11-29 MED ORDER — PHENYLEPHRINE 80 MCG/ML (10ML) SYRINGE FOR IV PUSH (FOR BLOOD PRESSURE SUPPORT)
PREFILLED_SYRINGE | INTRAVENOUS | Status: DC | PRN
  Administered 2021-11-29 (×3): 160 ug via INTRAVENOUS
  Administered 2021-11-29: 80 ug via INTRAVENOUS
  Administered 2021-11-29 (×2): 160 ug via INTRAVENOUS

## 2021-11-29 MED ORDER — ROCURONIUM BROMIDE 10 MG/ML (PF) SYRINGE
PREFILLED_SYRINGE | INTRAVENOUS | Status: DC | PRN
  Administered 2021-11-29: 70 mg via INTRAVENOUS

## 2021-11-29 MED ORDER — DEXAMETHASONE SODIUM PHOSPHATE 10 MG/ML IJ SOLN
INTRAMUSCULAR | Status: DC | PRN
  Administered 2021-11-29: 10 mg via INTRAVENOUS

## 2021-11-29 MED ORDER — FENTANYL CITRATE (PF) 250 MCG/5ML IJ SOLN
INTRAMUSCULAR | Status: DC | PRN
  Administered 2021-11-29: 100 ug via INTRAVENOUS

## 2021-11-29 MED ORDER — PROPOFOL 10 MG/ML IV BOLUS
INTRAVENOUS | Status: DC | PRN
  Administered 2021-11-29: 160 mg via INTRAVENOUS

## 2021-11-29 MED ORDER — LIDOCAINE 2% (20 MG/ML) 5 ML SYRINGE
INTRAMUSCULAR | Status: DC | PRN
  Administered 2021-11-29: 50 mg via INTRAVENOUS

## 2021-11-29 MED ORDER — LACTATED RINGERS IV SOLN: INTRAVENOUS | Status: DC

## 2021-11-29 MED ORDER — CHLORHEXIDINE GLUCONATE 0.12 % MT SOLN
15.0000 mL | Freq: Once | OROMUCOSAL | Status: AC
  Filled 2021-11-29: qty 15

## 2021-11-29 MED ORDER — ACETAMINOPHEN 160 MG/5ML PO SOLN: 1000.0000 mg | Freq: Once | ORAL | Status: DC | PRN

## 2021-11-29 MED ORDER — ACETAMINOPHEN 500 MG PO TABS: 1000.0000 mg | ORAL_TABLET | Freq: Once | ORAL | Status: DC | PRN

## 2021-11-29 MED ORDER — SUGAMMADEX SODIUM 200 MG/2ML IV SOLN
INTRAVENOUS | Status: DC | PRN
  Administered 2021-11-29: 400 mg via INTRAVENOUS

## 2021-11-29 NOTE — Interval H&P Note (Signed)
History and Physical Interval Note:  11/29/2021 7:36 AM  Juan Yang  has presented today for surgery, with the diagnosis of RIGHT HILAR ADENOPATHY.  The various methods of treatment have been discussed with the patient and family. After consideration of risks, benefits and other options for treatment, the patient has consented to  Procedure(s): ROBOTIC ASSISTED NAVIGATIONAL BRONCHOSCOPY (N/A) VIDEO BRONCHOSCOPY WITH ENDOBRONCHIAL ULTRASOUND (N/A) as a surgical intervention.  The patient's history has been reviewed, patient examined, no change in status, stable for surgery.  I have reviewed the patient's chart and labs.  Questions were answered to the patient's satisfaction.     Mona

## 2021-11-29 NOTE — Anesthesia Preprocedure Evaluation (Signed)
Anesthesia Evaluation  Patient identified by MRN, date of birth, ID band Patient awake    Reviewed: Allergy & Precautions, NPO status , Patient's Chart, lab work & pertinent test results  History of Anesthesia Complications Negative for: history of anesthetic complications  Airway Mallampati: III  TM Distance: >3 FB Neck ROM: Full    Dental  (+) Poor Dentition, Dental Advisory Given,    Pulmonary shortness of breath, neg sleep apnea, neg recent URI, former smoker,  RIGHT HILAR ADENOPATHY   breath sounds clear to auscultation       Cardiovascular hypertension, Pt. on medications and Pt. on home beta blockers (-) angina(-) Past MI and (-) CHF  Rhythm:Regular  Left ventricle: The cavity size was normal. Wall thickness was  normal. Systolic function was normal. The estimated ejection  fraction was in the range of 55% to 60%. Wall motion was normal;  there were no regional wall motion abnormalities. Doppler parameters  are consistent with abnormal left ventricular relaxation (grade 1  diastolic dysfunction).  Aortic valve: Trileaflet; normal thickness leaflets. Mobility was  not restricted. Doppler: Transvalvular velocity was within the  normal range. There was no stenosis. No regurgitation.  Aorta: Aortic root: The aortic root was normal in size.  Mitral valve: Structurally normal valve. Mobility was not  restricted. Doppler: Transvalvular velocity was within the normal  range. There was no evidence for stenosis. No regurgitation.  Left atrium: The atrium was normal in size.  Right ventricle: The cavity size was normal. Systolic function was  normal.  Pulmonic valve: Doppler: Transvalvular velocity was within the  normal range. There was no evidence for stenosis.  Tricuspid valve: Structurally normal valve. Doppler: Transvalvular  velocity was within the normal range. Mild regurgitation.  Pulmonary artery:  Systolic pressure was within the normal range.  Right atrium: The atrium was normal in size.  Pericardium: There was no pericardial effusion.  Systemic veins:  Inferior vena cava: The vessel was normal in size.     Neuro/Psych PSYCHIATRIC DISORDERS Anxiety Depression  Neuromuscular disease    GI/Hepatic negative GI ROS, Neg liver ROS,   Endo/Other  negative endocrine ROSNo results found for: "HGBA1C"   Renal/GU Renal InsufficiencyRenal diseaseLab Results      Component                Value               Date                      CREATININE               1.33 (H)            11/26/2021                Musculoskeletal  (+) Arthritis ,   Abdominal   Peds  Hematology negative hematology ROS (+) Lab Results      Component                Value               Date                      WBC                      10.3                11/26/2021  HGB                      14.3                11/26/2021                HCT                      42.1                11/26/2021                MCV                      91.3                11/26/2021                PLT                      316                 11/26/2021              Anesthesia Other Findings   Reproductive/Obstetrics                             Anesthesia Physical Anesthesia Plan  ASA: 3  Anesthesia Plan: General   Post-op Pain Management: Minimal or no pain anticipated   Induction: Intravenous  PONV Risk Score and Plan: 2 and Propofol infusion, Ondansetron and Treatment may vary due to age or medical condition  Airway Management Planned: Oral ETT  Additional Equipment: None  Intra-op Plan:   Post-operative Plan: Extubation in OR  Informed Consent: I have reviewed the patients History and Physical, chart, labs and discussed the procedure including the risks, benefits and alternatives for the proposed anesthesia with the patient or authorized representative who has  indicated his/her understanding and acceptance.     Dental advisory given  Plan Discussed with: CRNA  Anesthesia Plan Comments:         Anesthesia Quick Evaluation

## 2021-11-29 NOTE — Discharge Instructions (Signed)
Flexible Bronchoscopy, Care After This sheet gives you information about how to care for yourself after your test. Your doctor may also give you more specific instructions. If you have problems or questions, contact your doctor. Follow these instructions at home: Eating and drinking Do not eat or drink anything (not even water) for 2 hours after your test, or until your numbing medicine (local anesthetic) wears off. When your numbness is gone and your cough and gag reflexes have come back, you may: Eat only soft foods. Slowly drink liquids. The day after the test, go back to your normal diet. Driving Do not drive for 24 hours if you were given a medicine to help you relax (sedative). Do not drive or use heavy machinery while taking prescription pain medicine. General instructions  Take over-the-counter and prescription medicines only as told by your doctor. Return to your normal activities as told. Ask what activities are safe for you. Do not use any products that have nicotine or tobacco in them. This includes cigarettes and e-cigarettes. If you need help quitting, ask your doctor. Keep all follow-up visits as told by your doctor. This is important. It is very important if you had a tissue sample (biopsy) taken. Get help right away if: You have shortness of breath that gets worse. You get light-headed. You feel like you are going to pass out (faint). You have chest pain. You cough up: More than a little blood. More blood than before. Summary Do not eat or drink anything (not even water) for 2 hours after your test, or until your numbing medicine wears off. Do not use cigarettes. Do not use e-cigarettes. Get help right away if you have chest pain.  This information is not intended to replace advice given to you by your health care provider. Make sure you discuss any questions you have with your health care provider. Document Released: 11/24/2008 Document Revised: 01/09/2017 Document  Reviewed: 02/15/2016 Elsevier Patient Education  2020 [employer].

## 2021-11-29 NOTE — Op Note (Signed)
Video Bronchoscopy with Endobronchial Ultrasound Procedure Note  Date of Operation: 11/29/2021  Pre-op Diagnosis: Hilar adenopathy  Post-op Diagnosis: Hilar adenopathy  Surgeon: Garner Nash, DO  Assistants: None   Anesthesia: General endotracheal anesthesia  Operation: Flexible video fiberoptic bronchoscopy with endobronchial ultrasound and biopsies.  Estimated Blood Loss: Minimal  Complications: None   Indications and History: Juan Yang is a 66 y.o. male with hilar adenopathy.  The risks, benefits, complications, treatment options and expected outcomes were discussed with the patient.  The possibilities of pneumothorax, pneumonia, reaction to medication, pulmonary aspiration, perforation of a viscus, bleeding, failure to diagnose a condition and creating a complication requiring transfusion or operation were discussed with the patient who freely signed the consent.    Description of Procedure: The patient was examined in the preoperative area and history and data from the preprocedure consultation were reviewed. It was deemed appropriate to proceed.  The patient was taken to [facility] endoscopy 3, identified as [name] and the procedure verified as Flexible Video Fiberoptic Bronchoscopy.  A Time Out was held and the above information confirmed. After being taken to the operating room general anesthesia was initiated and the patient  was orally intubated. The video fiberoptic bronchoscope was introduced via the endotracheal tube and a general inspection was performed which showed normal right and left lung anatomy no evidence of endobronchial lesion. The standard scope was then withdrawn and the endobronchial ultrasound was used to identify and characterize the peritracheal, hilar and bronchial lymph nodes. Inspection showed enlarged right hilar adenopathy, no visible adenopathy within the subcarinal space, predominantly connective tissue.. Using real-time ultrasound  guidance Wang needle biopsies were take from Station 11 R nodes and were sent for cytology. The patient tolerated the procedure well without apparent complications. There was no significant blood loss. The bronchoscope was withdrawn. Anesthesia was reversed and the patient was taken to the PACU for recovery.   Samples: 1. Wang needle biopsies from 11R node  Plans:  The patient will be discharged from the PACU to home when recovered from anesthesia. We will review the cytology, pathology results with the patient when they become available. Outpatient followup will be with Garner Nash, DO.   Garner Nash, DO [facility] 11/29/2021 9:12 AM

## 2021-11-29 NOTE — Transfer of Care (Signed)
Immediate Anesthesia Transfer of Care Note  Patient: Juan Yang  Procedure(s) Performed: VIDEO BRONCHOSCOPY WITH ENDOBRONCHIAL ULTRASOUND BRONCHIAL NEEDLE ASPIRATION BIOPSIES  Patient Location: PACU  Anesthesia Type:General  Level of Consciousness: awake, alert , and oriented  Airway & Oxygen Therapy: Patient Spontanous Breathing  Post-op Assessment: Report given to RN, Post -op Vital signs reviewed and stable, and Patient moving all extremities X 4  Post vital signs: Reviewed and stable  Last Vitals:  Vitals Value Taken Time  BP 75/55 11/29/21 0904  Temp    Pulse 78 11/29/21 0905  Resp 14 11/29/21 0905  SpO2 96 % 11/29/21 0905  Vitals shown include unvalidated device data.  Last Pain:  Vitals:   11/29/21 0611  TempSrc: Oral  PainSc:       Patients Stated Pain Goal: 2 (30/09/23 3007)  Complications: No notable events documented.

## 2021-11-29 NOTE — Anesthesia Procedure Notes (Signed)
Procedure Name: Intubation Date/Time: 11/29/2021 7:59 AM  Performed by: Gaylene Brooks, CRNAPre-anesthesia Checklist: Patient identified, Emergency Drugs available, Suction available and Patient being monitored Patient Re-evaluated:Patient Re-evaluated prior to induction Oxygen Delivery Method: Circle System Utilized Preoxygenation: Pre-oxygenation with 100% oxygen Induction Type: IV induction Ventilation: Mask ventilation without difficulty Laryngoscope Size: Miller and 2 Grade View: Grade II Tube type: Oral Tube size: 8.5 mm Number of attempts: 1 Airway Equipment and Method: Stylet and Oral airway Placement Confirmation: ETT inserted through vocal cords under direct vision, positive ETCO2 and breath sounds checked- equal and bilateral Secured at: 23 cm Tube secured with: Tape Dental Injury: Teeth and Oropharynx as per pre-operative assessment

## 2021-12-02 ENCOUNTER — Encounter (HOSPITAL_COMMUNITY): Payer: Self-pay | Admitting: Pulmonary Disease

## 2021-12-02 NOTE — Anesthesia Postprocedure Evaluation (Signed)
Anesthesia Post Note  Patient: [name]  Procedure(s) Performed: VIDEO BRONCHOSCOPY WITH ENDOBRONCHIAL ULTRASOUND BRONCHIAL NEEDLE ASPIRATION BIOPSIES     Patient location during evaluation: PACU Anesthesia Type: General Level of consciousness: awake and alert Pain management: pain level controlled Vital Signs Assessment: post-procedure vital signs reviewed and stable Respiratory status: spontaneous breathing, nonlabored ventilation, respiratory function stable and patient connected to nasal cannula oxygen Cardiovascular status: blood pressure returned to baseline and stable Postop Assessment: no apparent nausea or vomiting Anesthetic complications: no   No notable events documented.  Last Vitals:  Vitals:   11/29/21 0915 11/29/21 0932  BP: 103/75 100/74  Pulse: 75 70  Resp: 16 12  Temp:    SpO2: 96% 97%    Last Pain:  Vitals:   11/29/21 0932  TempSrc:   PainSc: 0-No pain                 Maleah Rabago

## 2021-12-03 ENCOUNTER — Telehealth: Payer: Self-pay | Admitting: Student

## 2021-12-03 DIAGNOSIS — C3491 Malignant neoplasm of unspecified part of right bronchus or lung: Secondary | ICD-10-CM

## 2021-12-03 LAB — CYTOLOGY - NON PAP

## 2021-12-03 NOTE — Telephone Encounter (Signed)
Called to discuss bronch results. Has upcoming appointment with Dr. Delton Coombes 11/8, MR brain scheduled tomorrow. Unsure if FDG avid lesion in knee is related but by report sounds less likely. Will discuss at tumor board.

## 2021-12-04 ENCOUNTER — Telehealth: Payer: Self-pay | Admitting: Radiation Oncology

## 2021-12-04 NOTE — Telephone Encounter (Signed)
Called patient to schedule a consultation w. Dr. Sondra Come. No answer, LVM for a return call.

## 2021-12-05 ENCOUNTER — Telehealth: Payer: Self-pay | Admitting: Radiation Oncology

## 2021-12-05 ENCOUNTER — Ambulatory Visit (HOSPITAL_BASED_OUTPATIENT_CLINIC_OR_DEPARTMENT_OTHER)
Admission: RE | Admit: 2021-12-05 | Discharge: 2021-12-05 | Disposition: A | Discharge status: Home / Self Care | Payer: Medicare Other | Source: Ambulatory Visit | Attending: Family Medicine | Admitting: Family Medicine

## 2021-12-05 ENCOUNTER — Ambulatory Visit (HOSPITAL_COMMUNITY)
Admission: RE | Admit: 2021-12-05 | Discharge: 2021-12-05 | Disposition: A | Discharge status: Home / Self Care | Payer: Medicare Other | Source: Ambulatory Visit | Attending: Physician Assistant | Admitting: Physician Assistant

## 2021-12-05 DIAGNOSIS — R011 Cardiac murmur, unspecified: Secondary | ICD-10-CM | POA: Diagnosis not present

## 2021-12-05 DIAGNOSIS — R9082 White matter disease, unspecified: Secondary | ICD-10-CM | POA: Diagnosis not present

## 2021-12-05 DIAGNOSIS — I714 Abdominal aortic aneurysm, without rupture, unspecified: Secondary | ICD-10-CM | POA: Insufficient documentation

## 2021-12-05 DIAGNOSIS — I671 Cerebral aneurysm, nonruptured: Secondary | ICD-10-CM | POA: Diagnosis not present

## 2021-12-05 DIAGNOSIS — C9000 Multiple myeloma not having achieved remission: Secondary | ICD-10-CM | POA: Diagnosis not present

## 2021-12-05 DIAGNOSIS — C3490 Malignant neoplasm of unspecified part of unspecified bronchus or lung: Secondary | ICD-10-CM | POA: Diagnosis not present

## 2021-12-05 DIAGNOSIS — I771 Stricture of artery: Secondary | ICD-10-CM | POA: Diagnosis not present

## 2021-12-05 LAB — ECHOCARDIOGRAM COMPLETE
AR max vel: 3.5 cm2
AV Area VTI: 3.66 cm2
AV Area mean vel: 3.52 cm2
AV Mean grad: 3 mmHg
AV Peak grad: 6.4 mmHg
Ao pk vel: 1.26 m/s
Area-P 1/2: 5.16 cm2
MV VTI: 3.69 cm2
S' Lateral: 2.8 cm

## 2021-12-05 MED ORDER — GADOBUTROL 1 MMOL/ML IV SOLN
10.0000 mL | Freq: Once | INTRAVENOUS | Status: AC | PRN
  Administered 2021-12-05: 10 mL via INTRAVENOUS

## 2021-12-05 NOTE — Progress Notes (Incomplete)
*  PRELIMINARY RESULTS* Echocardiogram 2D Echocardiogram has been performed.  Elpidio Anis 12/05/2021, 12:32 PM

## 2021-12-05 NOTE — Telephone Encounter (Signed)
Called patient's wife to schedule a consultation w. Dr. Sondra Come. No answer, LVM for a return call.

## 2021-12-10 ENCOUNTER — Other Ambulatory Visit: Payer: Self-pay | Admitting: Family Medicine

## 2021-12-10 DIAGNOSIS — E7800 Pure hypercholesterolemia, unspecified: Secondary | ICD-10-CM

## 2021-12-10 DIAGNOSIS — R011 Cardiac murmur, unspecified: Secondary | ICD-10-CM

## 2021-12-10 DIAGNOSIS — I714 Abdominal aortic aneurysm, without rupture, unspecified: Secondary | ICD-10-CM

## 2021-12-10 DIAGNOSIS — I10 Essential (primary) hypertension: Secondary | ICD-10-CM

## 2021-12-12 ENCOUNTER — Encounter (HOSPITAL_COMMUNITY): Payer: Self-pay | Admitting: Emergency Medicine

## 2021-12-12 ENCOUNTER — Encounter: Payer: Self-pay | Admitting: Family Medicine

## 2021-12-12 ENCOUNTER — Other Ambulatory Visit: Payer: Self-pay

## 2021-12-12 ENCOUNTER — Inpatient Hospital Stay (HOSPITAL_COMMUNITY)
Admission: EM | Admit: 2021-12-12 | Discharge: 2021-12-15 | DRG: 418 | Disposition: A | Discharge status: Home / Self Care | Payer: Medicare Other | Attending: Internal Medicine | Admitting: Internal Medicine

## 2021-12-12 ENCOUNTER — Inpatient Hospital Stay (HOSPITAL_COMMUNITY)
Admission: RE | Admit: 2021-12-12 | Discharge: 2021-12-12 | Disposition: A | Discharge status: Home / Self Care | Payer: Medicare Other | Source: Ambulatory Visit | Attending: Family Medicine | Admitting: Family Medicine

## 2021-12-12 ENCOUNTER — Ambulatory Visit (INDEPENDENT_AMBULATORY_CARE_PROVIDER_SITE_OTHER): Payer: Medicare Other | Admitting: Family Medicine

## 2021-12-12 VITALS — BP 115/84 | HR 106 | Temp 97.4°F | Ht 73.0 in | Wt 203.0 lb

## 2021-12-12 DIAGNOSIS — N1831 Chronic kidney disease, stage 3a: Secondary | ICD-10-CM | POA: Diagnosis not present

## 2021-12-12 DIAGNOSIS — C3491 Malignant neoplasm of unspecified part of right bronchus or lung: Secondary | ICD-10-CM

## 2021-12-12 DIAGNOSIS — F17290 Nicotine dependence, other tobacco product, uncomplicated: Secondary | ICD-10-CM | POA: Diagnosis present

## 2021-12-12 DIAGNOSIS — K8020 Calculus of gallbladder without cholecystitis without obstruction: Secondary | ICD-10-CM | POA: Diagnosis not present

## 2021-12-12 DIAGNOSIS — R112 Nausea with vomiting, unspecified: Secondary | ICD-10-CM | POA: Diagnosis not present

## 2021-12-12 DIAGNOSIS — R110 Nausea: Secondary | ICD-10-CM

## 2021-12-12 DIAGNOSIS — J449 Chronic obstructive pulmonary disease, unspecified: Secondary | ICD-10-CM | POA: Diagnosis not present

## 2021-12-12 DIAGNOSIS — I10 Essential (primary) hypertension: Secondary | ICD-10-CM | POA: Diagnosis not present

## 2021-12-12 DIAGNOSIS — K8012 Calculus of gallbladder with acute and chronic cholecystitis without obstruction: Principal | ICD-10-CM | POA: Diagnosis present

## 2021-12-12 DIAGNOSIS — Z825 Family history of asthma and other chronic lower respiratory diseases: Secondary | ICD-10-CM | POA: Diagnosis not present

## 2021-12-12 DIAGNOSIS — K819 Cholecystitis, unspecified: Secondary | ICD-10-CM

## 2021-12-12 DIAGNOSIS — R1011 Right upper quadrant pain: Secondary | ICD-10-CM

## 2021-12-12 DIAGNOSIS — Z8673 Personal history of transient ischemic attack (TIA), and cerebral infarction without residual deficits: Secondary | ICD-10-CM

## 2021-12-12 DIAGNOSIS — D472 Monoclonal gammopathy: Secondary | ICD-10-CM | POA: Diagnosis present

## 2021-12-12 DIAGNOSIS — N281 Cyst of kidney, acquired: Secondary | ICD-10-CM | POA: Diagnosis not present

## 2021-12-12 DIAGNOSIS — Z91040 Latex allergy status: Secondary | ICD-10-CM

## 2021-12-12 DIAGNOSIS — F17210 Nicotine dependence, cigarettes, uncomplicated: Secondary | ICD-10-CM | POA: Diagnosis not present

## 2021-12-12 DIAGNOSIS — E871 Hypo-osmolality and hyponatremia: Secondary | ICD-10-CM

## 2021-12-12 DIAGNOSIS — Z7982 Long term (current) use of aspirin: Secondary | ICD-10-CM

## 2021-12-12 DIAGNOSIS — Z79891 Long term (current) use of opiate analgesic: Secondary | ICD-10-CM

## 2021-12-12 DIAGNOSIS — Z85828 Personal history of other malignant neoplasm of skin: Secondary | ICD-10-CM

## 2021-12-12 DIAGNOSIS — K760 Fatty (change of) liver, not elsewhere classified: Secondary | ICD-10-CM | POA: Diagnosis not present

## 2021-12-12 DIAGNOSIS — F32A Depression, unspecified: Secondary | ICD-10-CM | POA: Diagnosis present

## 2021-12-12 DIAGNOSIS — D72829 Elevated white blood cell count, unspecified: Secondary | ICD-10-CM | POA: Diagnosis not present

## 2021-12-12 DIAGNOSIS — I5032 Chronic diastolic (congestive) heart failure: Secondary | ICD-10-CM | POA: Insufficient documentation

## 2021-12-12 DIAGNOSIS — E559 Vitamin D deficiency, unspecified: Secondary | ICD-10-CM | POA: Diagnosis present

## 2021-12-12 DIAGNOSIS — C3490 Malignant neoplasm of unspecified part of unspecified bronchus or lung: Secondary | ICD-10-CM | POA: Diagnosis not present

## 2021-12-12 DIAGNOSIS — F419 Anxiety disorder, unspecified: Secondary | ICD-10-CM | POA: Diagnosis present

## 2021-12-12 DIAGNOSIS — K658 Other peritonitis: Secondary | ICD-10-CM | POA: Diagnosis not present

## 2021-12-12 DIAGNOSIS — N2581 Secondary hyperparathyroidism of renal origin: Secondary | ICD-10-CM | POA: Diagnosis present

## 2021-12-12 DIAGNOSIS — E7800 Pure hypercholesterolemia, unspecified: Secondary | ICD-10-CM | POA: Diagnosis present

## 2021-12-12 DIAGNOSIS — I130 Hypertensive heart and chronic kidney disease with heart failure and stage 1 through stage 4 chronic kidney disease, or unspecified chronic kidney disease: Secondary | ICD-10-CM | POA: Diagnosis present

## 2021-12-12 DIAGNOSIS — Z8249 Family history of ischemic heart disease and other diseases of the circulatory system: Secondary | ICD-10-CM

## 2021-12-12 DIAGNOSIS — K828 Other specified diseases of gallbladder: Secondary | ICD-10-CM | POA: Diagnosis not present

## 2021-12-12 DIAGNOSIS — Z880 Allergy status to penicillin: Secondary | ICD-10-CM

## 2021-12-12 DIAGNOSIS — Z888 Allergy status to other drugs, medicaments and biological substances status: Secondary | ICD-10-CM

## 2021-12-12 DIAGNOSIS — I509 Heart failure, unspecified: Secondary | ICD-10-CM | POA: Diagnosis not present

## 2021-12-12 DIAGNOSIS — Z8261 Family history of arthritis: Secondary | ICD-10-CM

## 2021-12-12 DIAGNOSIS — K8000 Calculus of gallbladder with acute cholecystitis without obstruction: Secondary | ICD-10-CM | POA: Diagnosis not present

## 2021-12-12 DIAGNOSIS — I110 Hypertensive heart disease with heart failure: Secondary | ICD-10-CM | POA: Diagnosis not present

## 2021-12-12 DIAGNOSIS — K810 Acute cholecystitis: Principal | ICD-10-CM

## 2021-12-12 DIAGNOSIS — E876 Hypokalemia: Secondary | ICD-10-CM

## 2021-12-12 DIAGNOSIS — Z87891 Personal history of nicotine dependence: Secondary | ICD-10-CM | POA: Diagnosis not present

## 2021-12-12 DIAGNOSIS — Z79899 Other long term (current) drug therapy: Secondary | ICD-10-CM

## 2021-12-12 DIAGNOSIS — R1010 Upper abdominal pain, unspecified: Secondary | ICD-10-CM | POA: Diagnosis not present

## 2021-12-12 DIAGNOSIS — G8929 Other chronic pain: Secondary | ICD-10-CM | POA: Diagnosis present

## 2021-12-12 LAB — URINALYSIS, ROUTINE W REFLEX MICROSCOPIC
Bilirubin Urine: NEGATIVE
Glucose, UA: NEGATIVE mg/dL
Ketones, ur: NEGATIVE mg/dL
Leukocytes,Ua: NEGATIVE
Nitrite: NEGATIVE
Protein, ur: 30 mg/dL — AB
Specific Gravity, Urine: 1.009 (ref 1.005–1.030)
pH: 6 (ref 5.0–8.0)

## 2021-12-12 LAB — COMPREHENSIVE METABOLIC PANEL
ALT: 11 U/L (ref 0–44)
AST: 9 U/L — ABNORMAL LOW (ref 15–41)
Albumin: 3.5 g/dL (ref 3.5–5.0)
Alkaline Phosphatase: 91 U/L (ref 38–126)
Anion gap: 9 (ref 5–15)
BUN: 15 mg/dL (ref 8–23)
CO2: 24 mmol/L (ref 22–32)
Calcium: 8.6 mg/dL — ABNORMAL LOW (ref 8.9–10.3)
Chloride: 98 mmol/L (ref 98–111)
Creatinine, Ser: 1.38 mg/dL — ABNORMAL HIGH (ref 0.61–1.24)
GFR, Estimated: 56 mL/min — ABNORMAL LOW (ref >60)
Glucose, Bld: 95 mg/dL (ref 70–99)
Potassium: 3.2 mmol/L — ABNORMAL LOW (ref 3.5–5.1)
Sodium: 131 mmol/L — ABNORMAL LOW (ref 135–145)
Total Bilirubin: 1.2 mg/dL (ref 0.3–1.2)
Total Protein: 7.2 g/dL (ref 6.5–8.1)

## 2021-12-12 LAB — CBC
HCT: 39.5 % (ref 39.0–52.0)
Hemoglobin: 13.7 g/dL (ref 13.0–17.0)
MCH: 30.9 pg (ref 26.0–34.0)
MCHC: 34.7 g/dL (ref 30.0–36.0)
MCV: 89.2 fL (ref 80.0–100.0)
Platelets: 246 10*3/uL (ref 150–400)
RBC: 4.43 MIL/uL (ref 4.22–5.81)
RDW: 11.9 % (ref 11.5–15.5)
WBC: 16.9 10*3/uL — ABNORMAL HIGH (ref 4.0–10.5)
nRBC: 0 % (ref 0.0–0.2)

## 2021-12-12 LAB — DIFFERENTIAL
Abs Immature Granulocytes: 0.06 10*3/uL (ref 0.00–0.07)
Basophils Absolute: 0.1 10*3/uL (ref 0.0–0.1)
Basophils Relative: 0 %
Eosinophils Absolute: 0.1 10*3/uL (ref 0.0–0.5)
Eosinophils Relative: 1 %
Immature Granulocytes: 0 %
Lymphocytes Relative: 15 %
Lymphs Abs: 2.5 10*3/uL (ref 0.7–4.0)
Monocytes Absolute: 1.5 10*3/uL — ABNORMAL HIGH (ref 0.1–1.0)
Monocytes Relative: 9 %
Neutro Abs: 13 10*3/uL — ABNORMAL HIGH (ref 1.7–7.7)
Neutrophils Relative %: 75 %

## 2021-12-12 LAB — LIPASE, BLOOD: Lipase: 23 U/L (ref 11–51)

## 2021-12-12 MED ORDER — METRONIDAZOLE 500 MG/100ML IV SOLN
500.0000 mg | Freq: Two times a day (BID) | INTRAVENOUS | Status: DC
  Administered 2021-12-13 – 2021-12-14 (×3): 500 mg via INTRAVENOUS
  Filled 2021-12-12 (×3): qty 100

## 2021-12-12 MED ORDER — POTASSIUM CHLORIDE CRYS ER 20 MEQ PO TBCR
40.0000 meq | EXTENDED_RELEASE_TABLET | Freq: Once | ORAL | Status: AC
  Administered 2021-12-12: 40 meq via ORAL
  Filled 2021-12-12: qty 2

## 2021-12-12 MED ORDER — CIPROFLOXACIN IN D5W 400 MG/200ML IV SOLN
400.0000 mg | Freq: Once | INTRAVENOUS | Status: AC
  Administered 2021-12-12: 400 mg via INTRAVENOUS
  Filled 2021-12-12: qty 200

## 2021-12-12 MED ORDER — METRONIDAZOLE 500 MG/100ML IV SOLN
500.0000 mg | Freq: Once | INTRAVENOUS | Status: AC
  Administered 2021-12-12: 500 mg via INTRAVENOUS
  Filled 2021-12-12: qty 100

## 2021-12-12 MED ORDER — SODIUM CHLORIDE 0.9 % IV SOLN: INTRAVENOUS | Status: AC

## 2021-12-12 MED ORDER — ONDANSETRON HCL 4 MG PO TABS: 4.0000 mg | ORAL_TABLET | Freq: Three times a day (TID) | ORAL | 0 refills | Status: DC | PRN

## 2021-12-12 MED ORDER — CIPROFLOXACIN IN D5W 400 MG/200ML IV SOLN
400.0000 mg | Freq: Two times a day (BID) | INTRAVENOUS | Status: DC
  Administered 2021-12-13 – 2021-12-14 (×4): 400 mg via INTRAVENOUS
  Filled 2021-12-12 (×3): qty 200

## 2021-12-12 MED ORDER — HYDROMORPHONE HCL 1 MG/ML IJ SOLN
0.5000 mg | INTRAMUSCULAR | Status: DC | PRN
  Administered 2021-12-13 – 2021-12-14 (×3): 0.5 mg via INTRAVENOUS
  Filled 2021-12-12 (×4): qty 0.5

## 2021-12-12 NOTE — Progress Notes (Signed)
Called ED for report x1.

## 2021-12-12 NOTE — Progress Notes (Signed)
Subjective:  Patient ID: Juan Yang, male    DOB: 04-24-1955, 66 y.o.   MRN: [id number]  Patient Care Team: Baruch Gouty, FNP as PCP - General (Family Medicine)   Chief Complaint:  Abdominal Pain (RUQ- x 1 month on and off pain. When having pain can last days. )   HPI: Juan Yang is a 66 y.o. male presenting on 12/12/2021 for Abdominal Pain (RUQ- x 1 month on and off pain. When having pain can last days. )   Pt presents today with complaints of intermittent RUQ pain for 1 month, worsening over the last several days. Does have some nausea with the pain. Does report pain is worse after heavy or greasy/fatty meals. No fever, chills, weakness, or changes in skin color.  Abdominal Pain This is a recurrent problem. The current episode started more than 1 month ago. The onset quality is undetermined. The problem occurs intermittently. The problem has been gradually worsening. The pain is located in the RUQ. The pain is moderate. The quality of the pain is colicky, aching and sharp. The abdominal pain does not radiate. Associated symptoms include nausea and vomiting. Pertinent negatives include no anorexia, arthralgias, belching, constipation, diarrhea, dysuria, fever, flatus, frequency, headaches, hematochezia, hematuria, melena, myalgias or weight loss. The pain is aggravated by eating. The pain is relieved by Nothing. He has tried nothing for the symptoms.    Relevant past medical, surgical, family, and social history reviewed and updated as indicated.  Allergies and medications reviewed and updated. Data reviewed: Chart in Epic.   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   History of kidney stones    Hypertension    Peripheral neuropathy 10/29/2016   Pneumonia    Transient cerebral ischemic attack, unspecified 05/10/2020   stroke    Past Surgical History:  Procedure Laterality Date   BRONCHIAL NEEDLE ASPIRATION BIOPSY  11/29/2021   Procedure: BRONCHIAL NEEDLE ASPIRATION BIOPSIES;  Surgeon: Garner Nash, DO;  Location: [facility];  Service: Pulmonary;;   COLONOSCOPY WITH PROPOFOL N/A 11/14/2016   Procedure: COLONOSCOPY WITH PROPOFOL;  Surgeon: Rogene Houston, MD;  [facility];  Service: Endoscopy;  Laterality: N/A;  9:15   LUMBAR LAMINECTOMY/DECOMPRESSION MICRODISCECTOMY Right 05/24/2019   Procedure: LAMINECTOMY AND FORAMINOTOMY LUMBAR FIVE- SACRAL ONE RIGHT;  Surgeon: Vallarie Mare, MD;  Location: [facility];  Service: Neurosurgery;  Laterality: Right;  posterior/right   LUNG BIOPSY     MOHS SURGERY Right 05/2020   ear   MOHS SURGERY     POLYPECTOMY  11/14/2016   Procedure: POLYPECTOMY- SIGMOID COLON X3 TRANSVERSE COLON X2;  Surgeon: Rogene Houston, MD;  [facility];  Service: Endoscopy;;   rt ankle surgery     Titanium hinge   TONSILLECTOMY     TRANSFORAMINAL LUMBAR INTERBODY FUSION (TLIF) WITH PEDICLE SCREW FIXATION 1 LEVEL N/A 05/02/2020   Procedure: Lumbar five Sacral one Transforaminal lumbar interbody  fusion;  Surgeon: Vallarie Mare, MD;  Location: [facility];  Service: Neurosurgery;  Laterality: N/A;   VIDEO BRONCHOSCOPY WITH ENDOBRONCHIAL ULTRASOUND N/A 11/29/2021   Procedure: VIDEO BRONCHOSCOPY WITH ENDOBRONCHIAL ULTRASOUND;  Surgeon: Garner Nash, DO;  Location: [facility];  Service: Pulmonary;  Laterality: N/A;    Social History   Socioeconomic History   Marital status: Married    Spouse name: Not on file   Number of children: Not on file   Years of education: Not on file   Highest  education level: Not on file  Occupational History   Not on file  Tobacco Use   Smoking status: Former    Packs/day: 1.50    Years: 20.00    Total pack years: 30.00    Types: Cigarettes    Quit date: 11/12/2012    Years since quitting: 9.0    Passive exposure: Never   Smokeless tobacco: Never   Tobacco comments:    Pt smokes 1 pack a month and vapes CBD oil. ARJ 11/22/21  Vaping Use   Vaping Use: Some days   Substances: CBD  Substance and Sexual Activity   Alcohol use: No   Drug use: No   Sexual activity: Yes    Birth control/protection: None  Other Topics Concern   Not on file  Social History Narrative   Right handed   Lives with wife in one story home   Social Determinants of Health   Financial Resource Strain: Not on file  Food Insecurity: Not on file  Transportation Needs: No Transportation Needs (05/30/2020)   PRAPARE - Transportation    Lack of Transportation (Medical): No    Lack of Transportation (Non-Medical): No  Physical Activity: Inactive (05/30/2020)   Exercise Vital Sign    Days of Exercise per Week: 0 days    Minutes of Exercise per Session: 0 min  Stress: Not on file  Social Connections: Not on file  Intimate Partner Violence: Not At Risk (05/30/2020)   Humiliation, Afraid, Rape, and Kick questionnaire    Fear of Current or Ex-Partner: No    Emotionally Abused: No    Physically Abused: No    Sexually Abused: No    Outpatient Encounter Medications as of 12/12/2021  Medication Sig   amLODipine (NORVASC) 2.5 MG tablet Take 2.5 mg by mouth 2 (two) times daily.   aspirin EC (ADULT ASPIRIN REGIMEN) 81 MG tablet Take 81 mg by mouth daily.   Ca Carbonate-Mag Hydroxide (ROLAIDS PO) Take 2-3 tablets by mouth daily as needed (heartburn).   clobetasol cream (TEMOVATE) 6.76 % Apply 1 application topically 2 (two) times daily as needed (rash).   cyclobenzaprine (FLEXERIL) 10 MG tablet Take 1 tablet (10 mg total) by mouth 3 (three) times daily as needed for muscle  spasms.   Glucosamine HCl-MSM (GLUCOSAMINE-MSM PO) Take 1 tablet by mouth daily.   losartan (COZAAR) 25 MG tablet Take 25 mg by mouth daily.   metoprolol succinate (TOPROL-XL) 25 MG 24 hr tablet Take 1 tablet (25 mg total) by mouth daily.   ondansetron (ZOFRAN) 4 MG tablet Take 1 tablet (4 mg total) by mouth every 8 (eight) hours as needed for nausea or vomiting.   oxyCODONE-acetaminophen (PERCOCET/ROXICET) 5-325 MG tablet Take 1-2 tablets by mouth every 6 (six) hours as needed for moderate pain.   Potassium 99 MG TABS Take 99 mg by mouth daily as needed (low potassium).   sildenafil (VIAGRA) 100 MG tablet Take 100 mg by mouth daily as needed for  erectile dysfunction.   sodium bicarbonate 650 MG tablet Take 650 mg by mouth 2 (two) times daily.   No facility-administered encounter medications on file as of 12/12/2021.    Allergies  Allergen Reactions   Other Hives    DUCK EGGS ONLY   Atenolol Diarrhea and Nausea Only    Pt unsure of allergy? Possible fainting?   Cymbalta [Duloxetine Hcl] Other (See Comments)    Pt states it shut his bladder down and could not urinate; states it made him lethargic as well.   Latex Itching   Penicillins Hives    Did it involve swelling of the face/tongue/throat, SOB, or low BP? Unknown Did it involve sudden or severe rash/hives, skin peeling, or any reaction on the inside of your mouth or nose? Unknown Did you need to seek medical attention at a hospital or doctor's office? yes When did it last happen?      Childhood If all above answers are "NO", may proceed with cephalosporin use.    Review of Systems  Constitutional:  Positive for appetite change. Negative for activity change, chills, diaphoresis, fatigue, fever, unexpected weight change and weight loss.  HENT: Negative.    Eyes: Negative.   Respiratory:  Negative for cough, chest tightness and shortness of breath.   Cardiovascular:  Negative for chest pain, palpitations and leg swelling.   Gastrointestinal:  Positive for abdominal pain, nausea and vomiting. Negative for abdominal distention, anal bleeding, anorexia, blood in stool, constipation, diarrhea, flatus, hematochezia, melena and rectal pain.  Endocrine: Negative.   Genitourinary:  Negative for decreased urine volume, difficulty urinating, dysuria, frequency, hematuria and urgency.  Musculoskeletal:  Negative for arthralgias and myalgias.  Skin: Negative.   Allergic/Immunologic: Negative.   Neurological:  Negative for dizziness, weakness and headaches.  Hematological: Negative.   Psychiatric/Behavioral:  Negative for confusion, hallucinations, sleep disturbance and suicidal ideas.   All other systems reviewed and are negative.       Objective:  BP 115/84   Pulse (!) 106   Temp (!) 97.4 F (36.3 C) (Temporal)   Ht _0  (1.854 m)   Wt 203 lb (92.1 kg)   SpO2 96%   BMI 26.78 kg/m    Wt Readings from Last 3 Encounters:  12/12/21 203 lb (92.1 kg)  11/27/21 214 lb 15.2 oz (97.5 kg)  11/22/21 205 lb 6.4 oz (93.2 kg)    Physical Exam Vitals and nursing note reviewed.  Constitutional:      General: He is in acute distress.     Appearance: Normal appearance. He is well-developed and well-groomed. He is obese. He is not ill-appearing, toxic-appearing or diaphoretic.  HENT:     Head: Normocephalic and atraumatic.     Jaw: There is normal jaw occlusion.     Right Ear: Hearing normal.     Left Ear: Hearing normal.     Nose: Nose normal.     Mouth/Throat:     Lips: Pink.     Mouth: Mucous membranes are moist.     Pharynx: Oropharynx is clear. Uvula midline.  Eyes:     General: Lids are normal.     Extraocular Movements: Extraocular movements intact.     Conjunctiva/sclera: Conjunctivae normal.     Pupils: Pupils are equal, round, and reactive to light.  Neck:     Thyroid: No thyroid mass, thyromegaly or thyroid tenderness.     Vascular: No carotid bruit or JVD.     Trachea: Trachea and phonation  normal.  Cardiovascular:  Rate and Rhythm: Normal rate and regular rhythm.     Chest Wall: PMI is not displaced.     Pulses: Normal pulses.     Heart sounds: Normal heart sounds. No murmur heard.    No friction rub. No gallop.  Pulmonary:     Effort: Pulmonary effort is normal. No respiratory distress.     Breath sounds: Normal breath sounds. No wheezing.  Abdominal:     General: Bowel sounds are normal. There is no distension or abdominal bruit.     Palpations: Abdomen is soft. There is no hepatomegaly or splenomegaly.     Tenderness: There is abdominal tenderness in the right upper quadrant. There is guarding and rebound. There is no right CVA tenderness or left CVA tenderness. Positive signs include Murphy's sign.     Hernia: No hernia is present.  Musculoskeletal:        General: Normal range of motion.     Cervical back: Normal range of motion and neck supple.     Right lower leg: No edema.     Left lower leg: No edema.  Lymphadenopathy:     Cervical: No cervical adenopathy.  Skin:    General: Skin is warm and dry.     Capillary Refill: Capillary refill takes less than 2 seconds.     Coloration: Skin is not cyanotic, jaundiced or pale.     Findings: No rash.  Neurological:     General: No focal deficit present.     Mental Status: He is alert and oriented to person, place, and time.     Sensory: Sensation is intact.     Motor: Motor function is intact.     Coordination: Coordination is intact.     Gait: Gait is intact.     Deep Tendon Reflexes: Reflexes are normal and symmetric.  Psychiatric:        Attention and Perception: Attention and perception normal.        Mood and Affect: Mood and affect normal.        Speech: Speech normal.        Behavior: Behavior normal. Behavior is cooperative.        Thought Content: Thought content normal.        Cognition and Memory: Cognition and memory normal.        Judgment: Judgment normal.     Results for orders placed or  performed during the hospital encounter of 12/05/21  ECHOCARDIOGRAM COMPLETE  Result Value Ref Range   AR max vel 3.50 cm2   AV Area VTI 3.66 cm2   AV Mean grad 3.0 mmHg   AV Peak grad 6.4 mmHg   Ao pk vel 1.26 m/s   AV Area mean vel 3.52 cm2   MV VTI 3.69 cm2   Area-P 1/2 5.16 cm2   S' Lateral 2.80 cm       Pertinent labs & imaging results that were available during my care of the patient were reviewed by me and considered in my medical decision making.  Assessment & Plan:  Juan Yang was seen today for abdominal pain.  Diagnoses and all orders for this visit:  RUQ pain Nausea Nausea Labs obtained and STAT US obtained. Zofran as needed for nausea. Further treatment pending results. Pt aware of red flags which require emergent evaluation and treatment.  -     Amylase -     CBC with Differential/Platelet -     CMP14+EGFR -     Lipase -  ondansetron (ZOFRAN) 4 MG tablet; Take 1 tablet (4 mg total) by mouth every 8 (eight) hours as needed for nausea or vomiting. -     US Abdomen Limited RUQ (LIVER/GB); Future -     Ambulatory referral to General Surgery  Cholecystitis US revealed acute cholecystitis. Attempting to get in to see surgery today, if unable, pt will go to ED.  -     Ambulatory referral to General Surgery     Continue all other maintenance medications.  Follow up plan: Return if symptoms worsen or fail to improve.   Continue healthy lifestyle choices, including diet (rich in fruits, vegetables, and lean proteins, and low in salt and simple carbohydrates) and exercise (at least 30 minutes of moderate physical activity daily).  The above assessment and management plan was discussed with the patient. The patient verbalized understanding of and has agreed to the management plan. Patient is aware to call the clinic if they develop any new symptoms or if symptoms persist or worsen. Patient is aware when to return to the clinic for a follow-up visit. Patient educated  on when it is appropriate to go to the emergency department.   Monia Pouch, FNP-C [facility] [phone]

## 2021-12-12 NOTE — ED Notes (Signed)
Attempted to call report X 1.

## 2021-12-12 NOTE — H&P (Signed)
History and Physical    Patient: Juan Yang TDD:[id number] DOB: January 22, 1956 DOA: 12/12/2021 DOS: the patient was seen and examined on 12/12/2021 PCP: Baruch Gouty, FNP  Patient coming from: Home  Chief Complaint:  Chief Complaint  Patient presents with   Abdominal Pain   HPI: Juan Yang is a 66 y.o. 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  Pain was constant, dull in nature and was rated as 7/10 on pain scale.  Pain was aggravated with food and alleviated with oxycodone (for back pain) and heat. He denies fever, chills, chest pain, shortness of breath.  ED Course:  In the emergency department, he was hemodynamically stable.  Work-up in the ED showed normal CBC except for leukocytosis, BMP showed sodium 131, potassium 3.2, chloride 98, bicarb 24, glucose 95, BUN 15, creatinine 1.38 (creatinine is within baseline range).  Lipase 23 RUQ ultrasound showed cholelithiasis with gallbladder wall thickening and positive sonographic Murphy sign concerning for acute cholecystitis. He was started on ciprofloxacin and Flagyl.  General surgery (Dr. Constance Haw) was consulted and recommended admitting patient to medicine team with plan to follow-up with patient in the morning.  Hospitalist was asked to admit patient for further evaluation and management.  Review of Systems: Review of systems as noted in the HPI. All other systems reviewed and are negative.   Past Medical History:  Diagnosis Date   AAA (abdominal aortic aneurysm) ([place])    3.2 cm 02/01/18 CT, recommended [place] in 3 years   Anxiety    Arthritis    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    Charcot-Marie-Tooth disease type 2    CKD (chronic kidney disease)    Stage 3   Dyspnea    with much movement   Guaiac + stool 10/29/2016   Heart murmur    "light" heard by FNP in August 2023.   History of kidney stones    Hypertension    Peripheral neuropathy 10/29/2016   Pneumonia    Transient cerebral ischemic attack, unspecified 05/10/2020   stroke   Past Surgical History:  Procedure Laterality Date   BRONCHIAL NEEDLE ASPIRATION BIOPSY  11/29/2021   Procedure: BRONCHIAL NEEDLE ASPIRATION BIOPSIES;  Surgeon: Garner Nash, DO;  Location: [facility];  Service: Pulmonary;;   COLONOSCOPY WITH PROPOFOL N/A 11/14/2016   Procedure: COLONOSCOPY WITH PROPOFOL;  Surgeon: Rogene Houston, MD;  [facility];  Service: Endoscopy;  Laterality: N/A;  9:15   LUMBAR LAMINECTOMY/DECOMPRESSION MICRODISCECTOMY Right 05/24/2019   Procedure: LAMINECTOMY AND FORAMINOTOMY LUMBAR FIVE- SACRAL ONE RIGHT;  Surgeon: Vallarie Mare, MD;  Location: [facility];  Service: Neurosurgery;  Laterality: Right;  posterior/right   LUNG BIOPSY     MOHS SURGERY Right 05/2020   ear   MOHS SURGERY     POLYPECTOMY  11/14/2016   Procedure: POLYPECTOMY- SIGMOID COLON X3 TRANSVERSE COLON X2;  Surgeon: Rogene Houston, MD;  Location: [facility];  Service: Endoscopy;;   rt ankle surgery     Titanium hinge   TONSILLECTOMY     TRANSFORAMINAL LUMBAR INTERBODY FUSION (TLIF) WITH PEDICLE SCREW FIXATION 1 LEVEL N/A 05/02/2020   Procedure: Lumbar five Sacral one Transforaminal lumbar interbody fusion;  Surgeon: Marcello Moores,  Dorcas Carrow, MD;  Location: [facility];  Service: Neurosurgery;  Laterality: N/A;   VIDEO BRONCHOSCOPY WITH ENDOBRONCHIAL ULTRASOUND N/A 11/29/2021   Procedure: VIDEO BRONCHOSCOPY WITH ENDOBRONCHIAL ULTRASOUND;  Surgeon: Garner Nash, DO;  Location: [facility];  Service: Pulmonary;  Laterality: N/A;    Social History:  reports that he quit smoking about 9 years ago. His smoking use included  cigarettes. He has a 30.00 pack-year smoking history. He has never been exposed to tobacco smoke. He has never used smokeless tobacco. He reports that he does not drink alcohol and does not use drugs.   Allergies  Allergen Reactions   Other Hives    DUCK EGGS ONLY   Atenolol Diarrhea and Nausea Only    Pt unsure of allergy? Possible fainting?   Cymbalta [Duloxetine Hcl] Other (See Comments)    Pt states it shut his bladder down and could not urinate; states it made him lethargic as well.   Latex Itching   Penicillins Hives    Did it involve swelling of the face/tongue/throat, SOB, or low BP? Unknown Did it involve sudden or severe rash/hives, skin peeling, or any reaction on the inside of your mouth or nose? Unknown Did you need to seek medical attention at a hospital or doctor's office? yes When did it last happen?      Childhood If all above answers are "NO", may proceed with cephalosporin use.    Family History  Problem Relation Age of Onset   Charcot-Marie-Tooth disease Mother    Heart disease Father    COPD Father    Arthritis Sister      Prior to Admission medications   Medication Sig Start Date End Date Taking? Authorizing Provider  amLODipine (NORVASC) 2.5 MG tablet Take 2.5 mg by mouth 2 (two) times daily. 04/18/19   [provider]  aspirin EC (ADULT ASPIRIN REGIMEN) 81 MG tablet Take 81 mg by mouth daily. 09/06/21   [provider]  Ca Carbonate-Mag Hydroxide (ROLAIDS PO) Take 2-3 tablets by mouth daily as needed (heartburn).    [provider]  clobetasol cream (TEMOVATE) 6.96 % Apply 1 application topically 2 (two) times daily as needed (rash).    [provider]  cyclobenzaprine (FLEXERIL) 10 MG tablet Take 1 tablet (10 mg total) by mouth 3 (three) times daily as needed for muscle spasms. 05/03/20   Vallarie Mare, MD  Glucosamine HCl-MSM (GLUCOSAMINE-MSM PO) Take 1 tablet by mouth daily.    [provider]  losartan  (COZAAR) 25 MG tablet Take 25 mg by mouth daily. 07/23/20   [provider]  metoprolol succinate (TOPROL-XL) 25 MG 24 hr tablet Take 1 tablet (25 mg total) by mouth daily. 10/03/21   Baruch Gouty, FNP  ondansetron (ZOFRAN) 4 MG tablet Take 1 tablet (4 mg total) by mouth every 8 (eight) hours as needed for nausea or vomiting. 12/12/21   Baruch Gouty, FNP  oxyCODONE-acetaminophen (PERCOCET/ROXICET) 5-325 MG tablet Take 1-2 tablets by mouth every 6 (six) hours as needed for moderate pain. 05/03/20   Vallarie Mare, MD  Potassium 99 MG TABS Take 99 mg by mouth daily as needed (low potassium).    [provider]  sildenafil (VIAGRA) 100 MG tablet Take 100 mg by mouth daily as needed for erectile dysfunction.    [provider]  sodium bicarbonate 650 MG tablet Take 650 mg by mouth 2 (two) times daily. 11/09/20   [provider]    Physical Exam: BP  116/88   Pulse 79   Temp 99.7 F (37.6 C) (Oral)   Resp 18   SpO2 94%   General: 66 y.o. year-old male well developed well nourished in no acute distress.  Alert and oriented x3. HEENT: NCAT, EOMI Neck: Supple, trachea medial Cardiovascular: Regular rate and rhythm with no rubs or gallops.  No thyromegaly or JVD noted.  No lower extremity edema. 2/4 pulses in all 4 extremities. Respiratory: Clear to auscultation with no wheezes or rales. Good inspiratory effort. Abdomen: Soft, tender to palpation in the RUQ with guarding.  Positive Murphy sign. Muskuloskeletal: No cyanosis, clubbing or edema noted bilaterally Neuro: CN II-XII intact, strength 5/5 x 4, sensation, reflexes intact Skin: No ulcerative lesions noted or rashes Psychiatry: Judgement and insight appear normal. Mood is appropriate for condition and setting          Labs on Admission:  Basic Metabolic Panel: Recent Labs  Lab 12/12/21 1738  NA 131*  K 3.2*  CL 98  CO2 24  GLUCOSE 95  BUN 15  CREATININE 1.38*  CALCIUM 8.6*   Liver Function  Tests: Recent Labs  Lab 12/12/21 1738  AST 9*  ALT 11  ALKPHOS 91  BILITOT 1.2  PROT 7.2  ALBUMIN 3.5   Recent Labs  Lab 12/12/21 1738  LIPASE 23   No results for input(s): "AMMONIA" in the last 168 hours. CBC: Recent Labs  Lab 12/12/21 1738  WBC 16.9*  NEUTROABS 13.0*  HGB 13.7  HCT 39.5  MCV 89.2  PLT 246   Cardiac Enzymes: No results for input(s): "CKTOTAL", "CKMB", "CKMBINDEX", "TROPONINI" in the last 168 hours.  BNP (last 3 results) No results for input(s): "BNP" in the last 8760 hours.  ProBNP (last 3 results) No results for input(s): "PROBNP" in the last 8760 hours.  CBG: No results for input(s): "GLUCAP" in the last 168 hours.  Radiological Exams on Admission: US Abdomen Limited RUQ (LIVER/GB)  Result Date: 12/12/2021 CLINICAL DATA:  Right upper quadrant pain, nausea EXAM: ULTRASOUND ABDOMEN LIMITED RIGHT UPPER QUADRANT COMPARISON:  None Available. FINDINGS: Gallbladder: Cholelithiasis with the largest measuring 29.5 mm. Gallbladder sludge. Gallbladder wall thickening measuring 6.5 mm. Positive sonographic Murphy sign. No pericholecystic fluid. Common bile duct: Diameter: 4.9 mm Liver: No focal lesion identified. Increased hepatic parenchymal echogenicity. Portal vein is patent on color Doppler imaging with normal direction of blood flow towards the liver. Other: 3.86 x 4.6 x 3.3 cm right upper pole renal cyst. IMPRESSION: 1. Cholelithiasis with gallbladder wall thickening and positive sonographic Murphy sign concerning for acute cholecystitis. 2. Increased hepatic echogenicity as can be seen with hepatic steatosis. Electronically Signed   By: Kathreen Devoid M.D.   On: 12/12/2021 10:50    EKG: I independently viewed the EKG done and my findings are as followed: EKG was not done in the ED  Assessment/Plan Present on Admission:  Cholelithiasis with acute cholecystitis  Stage 3a chronic kidney disease ([place])  Principal Problem:   Cholelithiasis with acute  cholecystitis Active Problems:   Stage 3a chronic kidney disease ([facility])   Leukocytosis   Hypokalemia   Hyponatremia   Lung cancer ([facility])   Chronic diastolic CHF (congestive heart failure) ([place])  Cholelithiasis with acute cholecystitis RUQ ultrasound was suggestive of cholelithiasis with acute cholecystitis Patient was started on IV ciprofloxacin and Flagyl, we shall continue with same at this time. Continue IV Dilaudid 0.5 mg every 4 hours as needed for moderate/severe pain Continue IV hydration Patient will be placed n.p.o. in  anticipation for possible surgical intervention in the morning General surgery was already consulted by ED PA and will follow-up with patient in the morning.  We shall await further recommendations  Leukocytosis possibly due to above vs persistent leukocytosis WBC 16.9, continue management as described above Continue to monitor CBC with morning labs  Hyponatremia Na  131, continue IV NS Continue to monitor sodium level with morning labs  Hypokalemia K+ 3.2, this will be replenished  Squamous cell carcinoma of the lung Patient was recently diagnosed and was referred to oncology clinic for management  Diastolic CHF Continue total input/output, daily weights and fluid restriction Echocardiogram done on 10/26 showed LVEF of 60 to 65%.  No RWMA.  G1 DD  Essential pretension Continue amlodipine, losartan  CKD 3A Creatinine 1.38 (creatinine is within baseline range) Renally adjust medications, avoid nephrotoxic agents/dehydration/hypotension   DVT prophylaxis: SCDs  Code Status: Full code  Consults: General surgery  Family Communication: Wife at bedside (all questions answered to satisfaction)  Severity of Illness: The appropriate patient status for this patient is INPATIENT. Inpatient status is judged to be reasonable and necessary in order to provide the required intensity of service to ensure the patient's safety. The patient's presenting  symptoms, physical exam findings, and initial radiographic and laboratory data in the context of their chronic comorbidities is felt to place them at high risk for further clinical deterioration. Furthermore, it is not anticipated that the patient will be medically stable for discharge from the hospital within 2 midnights of admission.   * I certify that at the point of admission it is my clinical judgment that the patient will require inpatient hospital care spanning beyond 2 midnights from the point of admission due to high intensity of service, high risk for further deterioration and high frequency of surveillance required.*  Author: Bernadette Hoit, DO 12/12/2021 9:05 PM  For on call review [email or web address].

## 2021-12-12 NOTE — ED Triage Notes (Signed)
Ruq pain x 4 days. No gu changes. Less bm's than usual. Had [place] today and was told to come to ED due to his gallbladder. Nad. Color wnl.

## 2021-12-12 NOTE — ED Provider Notes (Signed)
[facility] EMERGENCY DEPARTMENT Provider Note   CSN: [id number] Arrival date & time: 12/12/21  1551     History  Chief Complaint  Patient presents with   Abdominal Pain    Juan Yang is a 66 y.o. male with Hx of CMT muscular atrophy, CKD stage III, secondary hyperparathyroidism, anxiety, depression, COPD, HTN, AAA presenting to the ED today with 4 days of RUQ pain.  Has had intermittent pain over the last few years, usually last less than a day before it resolves on its own.  However at this point has remained constant as stated over the last 4 days without improvement.  Started after eating sausage.  Also with mild constipation.  Had US of the RUQ earlier this morning and was told to come to the ED for his gallbladder.  Denies N/V/D, fevers, chills, urinary symptoms, chest pain, or shortness of breath.  Per review of records, currently being assessed for likely lung malignancy found on recent PET scan.  The history is provided by the patient and medical records.  Abdominal Pain      Home Medications Prior to Admission medications   Medication Sig Start Date End Date Taking? Authorizing Provider  amLODipine (NORVASC) 2.5 MG tablet Take 2.5 mg by mouth 2 (two) times daily. 04/18/19   [provider]  aspirin EC (ADULT ASPIRIN REGIMEN) 81 MG tablet Take 81 mg by mouth daily. 09/06/21   [provider]  Ca Carbonate-Mag Hydroxide (ROLAIDS PO) Take 2-3 tablets by mouth daily as needed (heartburn).    [provider]  clobetasol cream (TEMOVATE) 2.99 % Apply 1 application topically 2 (two) times daily as needed (rash).    [provider]  cyclobenzaprine (FLEXERIL) 10 MG tablet Take 1 tablet (10 mg total) by mouth 3 (three) times daily as needed for muscle spasms. 05/03/20   Vallarie Mare, MD  Glucosamine HCl-MSM (GLUCOSAMINE-MSM PO) Take 1 tablet by mouth daily.    [provider]  losartan (COZAAR) 25 MG tablet Take 25 mg by mouth  daily. 07/23/20   [provider]  metoprolol succinate (TOPROL-XL) 25 MG 24 hr tablet Take 1 tablet (25 mg total) by mouth daily. 10/03/21   Baruch Gouty, FNP  ondansetron (ZOFRAN) 4 MG tablet Take 1 tablet (4 mg total) by mouth every 8 (eight) hours as needed for nausea or vomiting. 12/12/21   Baruch Gouty, FNP  oxyCODONE-acetaminophen (PERCOCET/ROXICET) 5-325 MG tablet Take 1-2 tablets by mouth every 6 (six) hours as needed for moderate pain. 05/03/20   Vallarie Mare, MD  Potassium 99 MG TABS Take 99 mg by mouth daily as needed (low potassium).    [provider]  sildenafil (VIAGRA) 100 MG tablet Take 100 mg by mouth daily as needed for erectile dysfunction.    [provider]  sodium bicarbonate 650 MG tablet Take 650 mg by mouth 2 (two) times daily. 11/09/20   [provider]      Allergies    Other, Atenolol, Cymbalta [duloxetine hcl], Latex, and Penicillins    Review of Systems   Review of Systems  Gastrointestinal:  Positive for abdominal pain.    Physical Exam Updated Vital Signs BP 116/88   Pulse 79   Temp 98.2 F (36.8 C) (Oral)   Resp 18   SpO2 94%  Physical Exam Vitals and nursing note reviewed.  Constitutional:      General: He is not in acute distress.    Appearance: He is well-developed.  He is not ill-appearing, toxic-appearing or diaphoretic.  HENT:     Head: Normocephalic and atraumatic.  Eyes:     General: No scleral icterus.    Conjunctiva/sclera: Conjunctivae normal.  Cardiovascular:     Rate and Rhythm: Normal rate and regular rhythm.     Heart sounds: No murmur heard. Pulmonary:     Effort: Pulmonary effort is normal. No respiratory distress.     Breath sounds: Normal breath sounds.  Abdominal:     Palpations: Abdomen is soft. There is mass (RUQ).     Tenderness: There is abdominal tenderness. There is guarding (Mild). There is no right CVA tenderness or left CVA tenderness. Positive signs include Murphy's  sign. Negative signs include McBurney's sign.  Musculoskeletal:        General: No swelling.     Cervical back: Neck supple.  Skin:    General: Skin is warm and dry.     Capillary Refill: Capillary refill takes less than 2 seconds.     Coloration: Skin is not cyanotic or jaundiced.  Neurological:     Mental Status: He is alert and oriented to person, place, and time.  Psychiatric:        Mood and Affect: Mood normal.     ED Results / Procedures / Treatments   Labs (all labs ordered are listed, but only abnormal results are displayed) Labs Reviewed  COMPREHENSIVE METABOLIC PANEL - Abnormal; Notable for the following components:      Result Value   Sodium 131 (*)    Potassium 3.2 (*)    Creatinine, Ser 1.38 (*)    Calcium 8.6 (*)    AST 9 (*)    GFR, Estimated 56 (*)    All other components within normal limits  CBC - Abnormal; Notable for the following components:   WBC 16.9 (*)    All other components within normal limits  LIPASE, BLOOD  URINALYSIS, ROUTINE W REFLEX MICROSCOPIC  DIFFERENTIAL    EKG None  Radiology US Abdomen Limited RUQ (LIVER/GB)  Result Date: 12/12/2021 CLINICAL DATA:  Right upper quadrant pain, nausea EXAM: ULTRASOUND ABDOMEN LIMITED RIGHT UPPER QUADRANT COMPARISON:  None Available. FINDINGS: Gallbladder: Cholelithiasis with the largest measuring 29.5 mm. Gallbladder sludge. Gallbladder wall thickening measuring 6.5 mm. Positive sonographic Murphy sign. No pericholecystic fluid. Common bile duct: Diameter: 4.9 mm Liver: No focal lesion identified. Increased hepatic parenchymal echogenicity. Portal vein is patent on color Doppler imaging with normal direction of blood flow towards the liver. Other: 3.86 x 4.6 x 3.3 cm right upper pole renal cyst. IMPRESSION: 1. Cholelithiasis with gallbladder wall thickening and positive sonographic Murphy sign concerning for acute cholecystitis. 2. Increased hepatic echogenicity as can be seen with hepatic steatosis.  Electronically Signed   By: Kathreen Devoid M.D.   On: 12/12/2021 10:50    Procedures Procedures    Medications Ordered in ED Medications  ciprofloxacin (CIPRO) IVPB 400 mg (has no administration in time range)    And  metroNIDAZOLE (FLAGYL) IVPB 500 mg (has no administration in time range)    ED Course/ Medical Decision Making/ A&P Clinical Course as of 12/12/21 1919  Thu Dec 12, 2021  1847 Consulted with Dr. Constance Haw of general surgery.  Reviewed patient's case in detail.  Agrees with likely acute cholecystitis presentation, plan for admission with medicine.  N.p.o. after midnight as precaution.  Surgery team plans to come and assess in the morning. [AC]  1918 Consulted with Dr. Josephine Cables of hospitalist team.  Shared recommendations  of Dr. Constance Haw and discussed patient case in detail.  Agrees with plan for admission. [AC]    Clinical Course User Index [AC] Prince Rome, PA-C                           Medical Decision Making Amount and/or Complexity of Data Reviewed Labs: ordered.  Risk Prescription drug management. Decision regarding hospitalization.   66 y.o. male presents to the ED for concern of Abdominal Pain     This involves an extensive number of treatment options, and is a complaint that carries with it a high risk of complications and morbidity.     Past Medical History / Co-morbidities / Social History: Hx of CMT muscular atrophy, CKD stage III, secondary hyperparathyroidism, anxiety, depression, COPD, HTN, AAA Social Determinants of Health include: Elderly  Additional History:  Obtained by chart review.  Notably echocardiogram of 12/05/21: LVEF 60-65%, moderate LVH, grade 1 diastolic dysfunction.  Also 11/13/21 cancer center detailing concern for possible lung cancer found on PET scan 10/2021.     Lab Tests: I ordered, and personally interpreted labs.  The pertinent results include:   WBC 16.0 Sodium 131, mildly decreased from baseline 137 of 2 weeks  ago Creatinine 1.38, stable Lipase 23 Total bili 1.2  Imaging Studies: I independently visualized and interpreted RUQ US imaging which showed cholelithiasis with gallbladder wall thickening and positive sonographic Murphy's sign, suggestive of acute cholecystitis.  Incidental finding of hepatic steatosis I agree with the radiologist interpretation.  ED Course / Critical Interventions: Pt well-appearing on exam.  Presenting with 4 days of constant RUQ tenderness.  Has been intermittent over the last few years, usually short in duration.  Pain experienced after eating sausage.  Was sent for RUQ [place] earlier today, findings suggestive of acute cholecystitis, recommended to come to the ED for further evaluation and treatment.  As noted above, patient with extensive history that includes newly suspected lung malignancy in addition to CMT, COPD, AAA, HTN.   Elevated WBC 16.9 and HR greater than 90.  Technically with suspected infection does meet SIRS/sepsis criteria at this time.  Does not appear jaundiced.  Afebrile.  Hemodynamically stable.  Elevated WBC 16.9 and HR greater than 90.  RUQ tenderness appreciated on exam.  Abdomen appears soft, overall nondistended nonsurgical appearing. Consulted with Dr. Constance Haw of general surgery, see note above.  Unable to proceed with ceftriaxone due to penicillin allergy.  Patient also elderly male, elevated WBC, symptoms greater than 72 hours, with marked inflammation of the gallbladder, and mildly palpable mass of RUQ on exam.  Plan to proceed with ciprofloxacin with added metronidazole coverage. Consulted with Dr. Josephine Cables of hospitalist team, see note above.  Plan to admit. I have reviewed the patients home medicines and have made adjustments as needed.  Disposition: Admission  This chart was dictated using voice recognition software.  Despite best efforts to proofread, errors can occur which can change the documentation meaning.         Final Clinical  Impression(s) / ED Diagnoses Final diagnoses:  Cholecystitis, acute  RUQ pain  Nausea    Rx / DC Orders ED Discharge Orders     None         Candace Cruise 51/88/41 Johney Maine, MD 12/13/21 [phone]

## 2021-12-12 NOTE — ED Notes (Signed)
Pt refused PRN dilaudid at this time. Stated he took his home pain medication prior to RN return with pain meds. Dilaudid returned to pyxis.

## 2021-12-13 ENCOUNTER — Encounter (HOSPITAL_COMMUNITY): Payer: Self-pay | Admitting: Internal Medicine

## 2021-12-13 ENCOUNTER — Other Ambulatory Visit: Payer: Self-pay | Admitting: *Deleted

## 2021-12-13 ENCOUNTER — Other Ambulatory Visit: Payer: Self-pay

## 2021-12-13 ENCOUNTER — Inpatient Hospital Stay (HOSPITAL_COMMUNITY): Payer: Medicare Other | Admitting: Anesthesiology

## 2021-12-13 ENCOUNTER — Encounter (HOSPITAL_COMMUNITY)
Admission: EM | Disposition: A | Discharge status: Home / Self Care | Payer: Self-pay | Source: Home / Self Care | Attending: Internal Medicine

## 2021-12-13 ENCOUNTER — Ambulatory Visit: Payer: Medicare Other | Admitting: Internal Medicine

## 2021-12-13 DIAGNOSIS — K8000 Calculus of gallbladder with acute cholecystitis without obstruction: Secondary | ICD-10-CM

## 2021-12-13 DIAGNOSIS — I110 Hypertensive heart disease with heart failure: Secondary | ICD-10-CM

## 2021-12-13 DIAGNOSIS — N1831 Chronic kidney disease, stage 3a: Secondary | ICD-10-CM | POA: Diagnosis not present

## 2021-12-13 DIAGNOSIS — Z87891 Personal history of nicotine dependence: Secondary | ICD-10-CM

## 2021-12-13 DIAGNOSIS — I509 Heart failure, unspecified: Secondary | ICD-10-CM

## 2021-12-13 DIAGNOSIS — K810 Acute cholecystitis: Secondary | ICD-10-CM

## 2021-12-13 DIAGNOSIS — I5032 Chronic diastolic (congestive) heart failure: Secondary | ICD-10-CM

## 2021-12-13 HISTORY — PX: CHOLECYSTECTOMY: SHX55

## 2021-12-13 LAB — CMP14+EGFR
ALT: 9 IU/L (ref 0–44)
AST: 7 IU/L (ref 0–40)
Albumin/Globulin Ratio: 1.8 (ref 1.2–2.2)
Albumin: 4.1 g/dL (ref 3.9–4.9)
Alkaline Phosphatase: 114 IU/L (ref 44–121)
BUN/Creatinine Ratio: 8 — ABNORMAL LOW (ref 10–24)
BUN: 11 mg/dL (ref 8–27)
Bilirubin Total: 1.2 mg/dL (ref 0.0–1.2)
CO2: 21 mmol/L (ref 20–29)
Calcium: 9 mg/dL (ref 8.6–10.2)
Chloride: 96 mmol/L (ref 96–106)
Creatinine, Ser: 1.45 mg/dL — ABNORMAL HIGH (ref 0.76–1.27)
Globulin, Total: 2.3 g/dL (ref 1.5–4.5)
Glucose: 109 mg/dL — ABNORMAL HIGH (ref 70–99)
Potassium: 3.9 mmol/L (ref 3.5–5.2)
Sodium: 136 mmol/L (ref 134–144)
Total Protein: 6.4 g/dL (ref 6.0–8.5)
eGFR: 53 mL/min/{1.73_m2} — ABNORMAL LOW (ref >59)

## 2021-12-13 LAB — CBC WITH DIFFERENTIAL/PLATELET
Basophils Absolute: 0.1 10*3/uL (ref 0.0–0.2)
Basos: 1 %
EOS (ABSOLUTE): 0.1 10*3/uL (ref 0.0–0.4)
Eos: 0 %
Hematocrit: 39.4 % (ref 37.5–51.0)
Hemoglobin: 13.3 g/dL (ref 13.0–17.7)
Immature Grans (Abs): 0.1 10*3/uL (ref 0.0–0.1)
Immature Granulocytes: 1 %
Lymphocytes Absolute: 2.3 10*3/uL (ref 0.7–3.1)
Lymphs: 13 %
MCH: 30.9 pg (ref 26.6–33.0)
MCHC: 33.8 g/dL (ref 31.5–35.7)
MCV: 91 fL (ref 79–97)
Monocytes Absolute: 1.5 10*3/uL — ABNORMAL HIGH (ref 0.1–0.9)
Monocytes: 9 %
Neutrophils Absolute: 13.7 10*3/uL — ABNORMAL HIGH (ref 1.4–7.0)
Neutrophils: 76 %
Platelets: 280 10*3/uL (ref 150–450)
RBC: 4.31 x10E6/uL (ref 4.14–5.80)
RDW: 11.7 % (ref 11.6–15.4)
WBC: 17.7 10*3/uL — ABNORMAL HIGH (ref 3.4–10.8)

## 2021-12-13 LAB — CBC
HCT: 37.4 % — ABNORMAL LOW (ref 39.0–52.0)
Hemoglobin: 12.7 g/dL — ABNORMAL LOW (ref 13.0–17.0)
MCH: 30.9 pg (ref 26.0–34.0)
MCHC: 34 g/dL (ref 30.0–36.0)
MCV: 91 fL (ref 80.0–100.0)
Platelets: 228 10*3/uL (ref 150–400)
RBC: 4.11 MIL/uL — ABNORMAL LOW (ref 4.22–5.81)
RDW: 11.9 % (ref 11.5–15.5)
WBC: 11.4 10*3/uL — ABNORMAL HIGH (ref 4.0–10.5)
nRBC: 0 % (ref 0.0–0.2)

## 2021-12-13 LAB — COMPREHENSIVE METABOLIC PANEL
ALT: 10 U/L (ref 0–44)
AST: 9 U/L — ABNORMAL LOW (ref 15–41)
Albumin: 3.2 g/dL — ABNORMAL LOW (ref 3.5–5.0)
Alkaline Phosphatase: 87 U/L (ref 38–126)
Anion gap: 8 (ref 5–15)
BUN: 14 mg/dL (ref 8–23)
CO2: 23 mmol/L (ref 22–32)
Calcium: 8.4 mg/dL — ABNORMAL LOW (ref 8.9–10.3)
Chloride: 106 mmol/L (ref 98–111)
Creatinine, Ser: 1.36 mg/dL — ABNORMAL HIGH (ref 0.61–1.24)
GFR, Estimated: 57 mL/min — ABNORMAL LOW (ref >60)
Glucose, Bld: 99 mg/dL (ref 70–99)
Potassium: 4.1 mmol/L (ref 3.5–5.1)
Sodium: 137 mmol/L (ref 135–145)
Total Bilirubin: 1.2 mg/dL (ref 0.3–1.2)
Total Protein: 6.7 g/dL (ref 6.5–8.1)

## 2021-12-13 LAB — MAGNESIUM: Magnesium: 2.3 mg/dL (ref 1.7–2.4)

## 2021-12-13 LAB — LIPASE: Lipase: 18 U/L (ref 13–78)

## 2021-12-13 LAB — PHOSPHORUS: Phosphorus: 2.3 mg/dL — ABNORMAL LOW (ref 2.5–4.6)

## 2021-12-13 LAB — HIV ANTIBODY (ROUTINE TESTING W REFLEX): HIV Screen 4th Generation wRfx: NONREACTIVE

## 2021-12-13 LAB — AMYLASE: Amylase: 14 U/L — ABNORMAL LOW (ref 31–110)

## 2021-12-13 SURGERY — LAPAROSCOPIC CHOLECYSTECTOMY
Anesthesia: General | Site: Abdomen

## 2021-12-13 MED ORDER — PROPOFOL 10 MG/ML IV BOLUS
INTRAVENOUS | Status: AC
  Filled 2021-12-13: qty 20

## 2021-12-13 MED ORDER — ROCURONIUM BROMIDE 100 MG/10ML IV SOLN
INTRAVENOUS | Status: DC | PRN
  Administered 2021-12-13: 60 mg via INTRAVENOUS

## 2021-12-13 MED ORDER — HYDROMORPHONE HCL 1 MG/ML IJ SOLN
INTRAMUSCULAR | Status: AC
  Filled 2021-12-13: qty 0.5

## 2021-12-13 MED ORDER — DEXAMETHASONE SODIUM PHOSPHATE 10 MG/ML IJ SOLN
INTRAMUSCULAR | Status: DC | PRN
  Administered 2021-12-13: 10 mg via INTRAVENOUS

## 2021-12-13 MED ORDER — OXYCODONE HCL 5 MG PO TABS
5.0000 mg | ORAL_TABLET | Freq: Once | ORAL | Status: AC
  Administered 2021-12-13: 5 mg via ORAL
  Filled 2021-12-13: qty 1

## 2021-12-13 MED ORDER — SODIUM CHLORIDE 0.9 % IR SOLN
Status: DC | PRN
  Administered 2021-12-13: 1000 mL

## 2021-12-13 MED ORDER — MIDAZOLAM HCL 2 MG/2ML IJ SOLN
INTRAMUSCULAR | Status: AC
  Filled 2021-12-13: qty 2

## 2021-12-13 MED ORDER — LOSARTAN POTASSIUM 50 MG PO TABS
25.0000 mg | ORAL_TABLET | Freq: Every day | ORAL | Status: DC
  Administered 2021-12-13 – 2021-12-15 (×3): 25 mg via ORAL
  Filled 2021-12-13 (×3): qty 1

## 2021-12-13 MED ORDER — HEMOSTATIC AGENTS (NO CHARGE) OPTIME
TOPICAL | Status: DC | PRN
  Administered 2021-12-13 (×2): 1

## 2021-12-13 MED ORDER — FENTANYL CITRATE (PF) 250 MCG/5ML IJ SOLN
INTRAMUSCULAR | Status: AC
  Filled 2021-12-13: qty 5

## 2021-12-13 MED ORDER — ACETAMINOPHEN 325 MG PO TABS: 650.0000 mg | ORAL_TABLET | Freq: Four times a day (QID) | ORAL | Status: DC | PRN

## 2021-12-13 MED ORDER — BUPIVACAINE LIPOSOME 1.3 % IJ SUSP
INTRAMUSCULAR | Status: DC | PRN
  Administered 2021-12-13: 20 mL

## 2021-12-13 MED ORDER — ACETAMINOPHEN 650 MG RE SUPP: 650.0000 mg | Freq: Four times a day (QID) | RECTAL | Status: DC | PRN

## 2021-12-13 MED ORDER — CHLORHEXIDINE GLUCONATE CLOTH 2 % EX PADS: 6.0000 | MEDICATED_PAD | Freq: Once | CUTANEOUS | Status: DC

## 2021-12-13 MED ORDER — SUGAMMADEX SODIUM 500 MG/5ML IV SOLN
INTRAVENOUS | Status: DC | PRN
  Administered 2021-12-13: 400 mg via INTRAVENOUS

## 2021-12-13 MED ORDER — ORAL CARE MOUTH RINSE: 15.0000 mL | Freq: Once | OROMUCOSAL | Status: AC

## 2021-12-13 MED ORDER — SODIUM CHLORIDE 0.9 % IV SOLN: INTRAVENOUS | Status: AC

## 2021-12-13 MED ORDER — HYDROMORPHONE HCL 1 MG/ML IJ SOLN
0.5000 mg | INTRAMUSCULAR | Status: DC | PRN
  Administered 2021-12-13: 0.5 mg via INTRAVENOUS

## 2021-12-13 MED ORDER — AMLODIPINE BESYLATE 5 MG PO TABS
2.5000 mg | ORAL_TABLET | Freq: Every day | ORAL | Status: DC
  Administered 2021-12-13 – 2021-12-15 (×3): 2.5 mg via ORAL
  Filled 2021-12-13 (×3): qty 1

## 2021-12-13 MED ORDER — LIDOCAINE HCL (CARDIAC) PF 100 MG/5ML IV SOSY
PREFILLED_SYRINGE | INTRAVENOUS | Status: DC | PRN
  Administered 2021-12-13: 100 mg via INTRAVENOUS

## 2021-12-13 MED ORDER — CHLORHEXIDINE GLUCONATE 0.12 % MT SOLN
15.0000 mL | Freq: Once | OROMUCOSAL | Status: AC
  Administered 2021-12-13: 15 mL via OROMUCOSAL

## 2021-12-13 MED ORDER — HYDROMORPHONE HCL 1 MG/ML IJ SOLN
0.2500 mg | INTRAMUSCULAR | Status: DC | PRN
  Administered 2021-12-13 (×3): 0.5 mg via INTRAVENOUS

## 2021-12-13 MED ORDER — ONDANSETRON HCL 4 MG PO TABS: 4.0000 mg | ORAL_TABLET | Freq: Four times a day (QID) | ORAL | Status: DC | PRN

## 2021-12-13 MED ORDER — PHENYLEPHRINE HCL-NACL 20-0.9 MG/250ML-% IV SOLN
INTRAVENOUS | Status: AC
  Filled 2021-12-13: qty 250

## 2021-12-13 MED ORDER — ONDANSETRON HCL 4 MG/2ML IJ SOLN
INTRAMUSCULAR | Status: AC
  Filled 2021-12-13: qty 2

## 2021-12-13 MED ORDER — CIPROFLOXACIN IN D5W 400 MG/200ML IV SOLN
INTRAVENOUS | Status: AC
  Filled 2021-12-13: qty 200

## 2021-12-13 MED ORDER — FENTANYL CITRATE (PF) 100 MCG/2ML IJ SOLN
INTRAMUSCULAR | Status: DC | PRN
  Administered 2021-12-13: 100 ug via INTRAVENOUS
  Administered 2021-12-13: 50 ug via INTRAVENOUS
  Administered 2021-12-13: 100 ug via INTRAVENOUS

## 2021-12-13 MED ORDER — PHENYLEPHRINE HCL-NACL 20-0.9 MG/250ML-% IV SOLN
INTRAVENOUS | Status: DC | PRN
  Administered 2021-12-13: 30 ug/min via INTRAVENOUS

## 2021-12-13 MED ORDER — PROPOFOL 10 MG/ML IV BOLUS
INTRAVENOUS | Status: DC | PRN
  Administered 2021-12-13: 150 mg via INTRAVENOUS

## 2021-12-13 MED ORDER — MEPERIDINE HCL 50 MG/ML IJ SOLN: 6.2500 mg | INTRAMUSCULAR | Status: DC | PRN

## 2021-12-13 MED ORDER — MIDAZOLAM HCL 5 MG/5ML IJ SOLN
INTRAMUSCULAR | Status: DC | PRN
  Administered 2021-12-13 (×2): 2 mg via INTRAVENOUS

## 2021-12-13 MED ORDER — LACTATED RINGERS IV SOLN: INTRAVENOUS | Status: DC

## 2021-12-13 MED ORDER — ROCURONIUM BROMIDE 10 MG/ML (PF) SYRINGE
PREFILLED_SYRINGE | INTRAVENOUS | Status: AC
  Filled 2021-12-13: qty 10

## 2021-12-13 MED ORDER — BUPIVACAINE LIPOSOME 1.3 % IJ SUSP
INTRAMUSCULAR | Status: AC
  Filled 2021-12-13: qty 20

## 2021-12-13 MED ORDER — DEXAMETHASONE SODIUM PHOSPHATE 10 MG/ML IJ SOLN
INTRAMUSCULAR | Status: AC
  Filled 2021-12-13: qty 1

## 2021-12-13 MED ORDER — ONDANSETRON HCL 4 MG/2ML IJ SOLN: 4.0000 mg | Freq: Four times a day (QID) | INTRAMUSCULAR | Status: DC | PRN

## 2021-12-13 MED ORDER — CIPROFLOXACIN IN D5W 400 MG/200ML IV SOLN: 400.0000 mg | INTRAVENOUS | Status: DC

## 2021-12-13 MED ORDER — ONDANSETRON HCL 4 MG/2ML IJ SOLN
INTRAMUSCULAR | Status: DC | PRN
  Administered 2021-12-13: 4 mg via INTRAVENOUS

## 2021-12-13 SURGICAL SUPPLY — 43 items
APPLIER CLIP ROT 10 11.4 M/L (STAPLE) ×1
BLADE SURG 15 STRL LF DISP TIS (BLADE) ×1 IMPLANT
BLADE SURG 15 STRL SS (BLADE) ×1
CHLORAPREP W/TINT 26 (MISCELLANEOUS) ×1 IMPLANT
CLIP APPLIE ROT 10 11.4 M/L (STAPLE) ×1 IMPLANT
CLOTH BEACON ORANGE TIMEOUT ST (SAFETY) ×1 IMPLANT
COVER LIGHT HANDLE STERIS (MISCELLANEOUS) ×2 IMPLANT
DERMABOND ADVANCED .7 DNX12 (GAUZE/BANDAGES/DRESSINGS) ×1 IMPLANT
ELECT REM PT RETURN 9FT ADLT (ELECTROSURGICAL) ×1
ELECTRODE REM PT RTRN 9FT ADLT (ELECTROSURGICAL) ×1 IMPLANT
GAUZE 4X4 16PLY ~~LOC~~+RFID DBL (SPONGE) IMPLANT
GLOVE BIOGEL PI IND STRL 6.5 (GLOVE) ×1 IMPLANT
GLOVE BIOGEL PI IND STRL 7.0 (GLOVE) ×2 IMPLANT
GLOVE BIOGEL PI IND STRL 7.5 (GLOVE) IMPLANT
GLOVE SURG SS PI 6.5 STRL IVOR (GLOVE) IMPLANT
GLOVE SURG SS PI 7.0 STRL IVOR (GLOVE) IMPLANT
GOWN STRL REUS W/TWL LRG LVL3 (GOWN DISPOSABLE) ×3 IMPLANT
HEMOSTAT SNOW SURGICEL 2X4 (HEMOSTASIS) ×1 IMPLANT
INST SET LAPROSCOPIC AP (KITS) ×1 IMPLANT
KIT TURNOVER KIT A (KITS) ×1 IMPLANT
MANIFOLD NEPTUNE II (INSTRUMENTS) ×1 IMPLANT
NDL HYPO 21X1.5 SAFETY (NEEDLE) IMPLANT
NDL INSUFFLATION 14GA 120MM (NEEDLE) ×1 IMPLANT
NEEDLE HYPO 21X1.5 SAFETY (NEEDLE) ×1 IMPLANT
NEEDLE INSUFFLATION 14GA 120MM (NEEDLE) ×1 IMPLANT
NS IRRIG 1000ML POUR BTL (IV SOLUTION) ×1 IMPLANT
PACK LAP CHOLE LZT030E (CUSTOM PROCEDURE TRAY) ×1 IMPLANT
PAD ARMBOARD 7.5X6 YLW CONV (MISCELLANEOUS) ×1 IMPLANT
POWDER SURGICEL 3.0 GRAM (HEMOSTASIS) IMPLANT
SET BASIN LINEN APH (SET/KITS/TRAYS/PACK) ×1 IMPLANT
SET TUBE SMOKE EVAC HIGH FLOW (TUBING) ×1 IMPLANT
SLEEVE Z-THREAD 5X100MM (TROCAR) ×1 IMPLANT
SUT MNCRL AB 4-0 PS2 18 (SUTURE) ×2 IMPLANT
SUT VICRYL 0 UR6 27IN ABS (SUTURE) ×1 IMPLANT
SYR 20ML LL LF (SYRINGE) IMPLANT
SYS BAG RETRIEVAL 10MM (BASKET) ×1
SYSTEM BAG RETRIEVAL 10MM (BASKET) ×1 IMPLANT
TIP ENDOSCOPIC SURGICEL (TIP) IMPLANT
TROCAR Z-THRD FIOS HNDL 11X100 (TROCAR) ×1 IMPLANT
TROCAR Z-THREAD FIOS 5X100MM (TROCAR) ×1 IMPLANT
TROCAR Z-THREAD SLEEVE 11X100 (TROCAR) ×1 IMPLANT
TUBE CONNECTING 12X1/4 (SUCTIONS) ×1 IMPLANT
WARMER LAPAROSCOPE (MISCELLANEOUS) ×1 IMPLANT

## 2021-12-13 NOTE — H&P (Addendum)
I was present with the medical student for this service. I personally verified the history of present illness, performed the physical exam, and made the plan for this encounter. I have verified the medical student's documentation and made modifications where appropriately. I have personally documented in my own words a brief history, physical, and plan below.     PLAN: I counseled the patient about the indication, risks and benefits of laparoscopic cholecystectomy.  He understands there is a very small chance for bleeding, infection, injury to normal structures (including common bile duct), conversion to open surgery, persistent symptoms, evolution of postcholecystectomy diarrhea, need for secondary interventions, anesthesia reaction, cardiopulmonary issues and other risks not specifically detailed here. I described the expected recovery, the plan for follow-up and the restrictions during the recovery phase.  All questions were answered.  Consented patient. RUQ pain.  Juan Labrum, MD [facility] [address] [place], [place] [address] [phone] (office)   Subjective:   Patient is a 66 y.o. male with past medical history significant for CKD 3a, MGUS, squamous cell lung cancer who presents for 3 day history of RUQ pain. Patient states that he has been experiencing these episodes of pain over the last three years, but it increased in frequency and intensity over the last month and more acutely starting earlier this week. He reports the RUQ pain "waxes and wanes" in intensity throughout the day, is provoked by eating greasy foods, and does not radiate. Patient endorses decreased appetite, nausea and myalgias over the last few days, he denies any vomiting, diarrhea, fever, or chills. Patient states he has constipation due to opioids taken for back pain, last bowel movement was 3 days ago. Patient was seen by PCP yesterday and RUQ U/S showed cholelithiasis with  gallbladder wall thickening concerning for acute cholecystitis. Patient presented to the ED yesterday and labs showed leukocytosis to 16.9, sodium 131 and potassium 3.2. Lipase and LFTs were normal. He was started on ciprofloxacin and metronidazole. Patient admitted yesterday evening and kept NPO. Patient denies any history of abdominal surgeries. Patient states he is taking daily aspirin, last dose was 2 days ago. Patient endorses daily vaping and was a former smoker, he endorses occasional alcohol use, and denies recreational drug use.   Patient Active Problem List   Diagnosis Date Noted   Cholelithiasis with acute cholecystitis 12/12/2021   Leukocytosis 12/12/2021   Hypokalemia 12/12/2021   Hyponatremia 12/12/2021   Lung cancer ([place]) 12/12/2021   Chronic diastolic CHF (congestive heart failure) ([place]) 12/12/2021   Hilar adenopathy 11/22/2021   Abdominal aortic aneurysm (AAA) 3.0 cm to 5.5 cm in diameter in male [facility]) 10/03/2021   Secondary hyperparathyroidism ([place]) 07/22/2020   Vitamin D deficiency 07/22/2020   Anxiety and depression 07/22/2020   MGUS (monoclonal gammopathy of unknown significance) 07/22/2020   History of COPD 07/22/2020   Tobacco use disorder 07/22/2020   Fatty liver 07/22/2020   Essential hypertension 07/22/2020   DDD (degenerative disc disease), lumbar 07/22/2020   Hypercholesteremia 06/30/2020   Stage 3a chronic kidney disease ([place]) 06/30/2020   Lumbar radiculopathy 05/02/2020   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   History of kidney stones    Hypertension    Peripheral neuropathy 10/29/2016   Pneumonia    Transient cerebral ischemic attack, unspecified 05/10/2020   stroke    Past Surgical History:  Procedure Laterality Date   BRONCHIAL NEEDLE ASPIRATION BIOPSY  11/29/2021   Procedure: BRONCHIAL NEEDLE ASPIRATION BIOPSIES;  Surgeon: Garner Nash, DO;  Location: [facility];  Service: Pulmonary;;   COLONOSCOPY WITH PROPOFOL N/A 11/14/2016   Procedure: COLONOSCOPY WITH PROPOFOL;  Surgeon: Rogene Houston, MD;  [facility];  Service: Endoscopy;  Laterality: N/A;  9:15   LUMBAR LAMINECTOMY/DECOMPRESSION MICRODISCECTOMY Right 05/24/2019   Procedure: LAMINECTOMY AND FORAMINOTOMY LUMBAR FIVE- SACRAL ONE RIGHT;  Surgeon: Vallarie Mare, MD;  Location: [facility];  Service: Neurosurgery;  Laterality: Right;  posterior/right   LUNG BIOPSY     MOHS SURGERY Right 05/2020   ear   MOHS SURGERY     POLYPECTOMY  11/14/2016   Procedure: POLYPECTOMY- SIGMOID COLON X3 TRANSVERSE COLON X2;  Surgeon: Rogene Houston, MD;  [facility];  Service: Endoscopy;;   rt ankle surgery     Titanium hinge   TONSILLECTOMY     TRANSFORAMINAL LUMBAR INTERBODY FUSION (TLIF) WITH PEDICLE SCREW FIXATION 1 LEVEL N/A 05/02/2020   Procedure: Lumbar five Sacral one Transforaminal lumbar interbody fusion;  Surgeon: Vallarie Mare, MD;  Location: [facility];  Service: Neurosurgery;  Laterality: N/A;   VIDEO BRONCHOSCOPY WITH ENDOBRONCHIAL ULTRASOUND N/A 11/29/2021   Procedure: VIDEO BRONCHOSCOPY WITH ENDOBRONCHIAL ULTRASOUND;  Surgeon: Garner Nash, DO;  Location: [facility];  Service: Pulmonary;  Laterality: N/A;    Medications Prior to Admission  Medication Sig Dispense Refill Last Dose   amLODipine (NORVASC) 2.5 MG tablet Take 2.5 mg by mouth 2 (two) times daily.   12/12/2021   aspirin EC (ADULT  ASPIRIN REGIMEN) 81 MG tablet Take 81 mg by mouth daily.   Past Week   calcium carbonate (TUMS - DOSED IN MG ELEMENTAL CALCIUM) 500 MG chewable tablet Chew 2 tablets by mouth daily.   unknown   clobetasol cream (TEMOVATE) 7.35 % Apply 1 application topically 2 (two) times daily as needed (rash).   unknown   cyclobenzaprine (FLEXERIL) 10 MG tablet Take 1 tablet (10 mg total) by mouth 3 (three) times daily as needed for muscle spasms. 30 tablet 0 12/12/2021   Glucosamine HCl-MSM (GLUCOSAMINE-MSM PO) Take 1 tablet by mouth daily.   12/12/2021   losartan (COZAAR) 25 MG tablet Take 25 mg by mouth daily.   12/12/2021   metoprolol succinate (TOPROL-XL) 25 MG 24 hr tablet Take 1 tablet (25 mg total) by mouth daily. 90 tablet 3 12/12/2021 at 0730   oxyCODONE (OXY IR/ROXICODONE) 5 MG immediate release tablet Take 5 mg by mouth every 4 (four) hours as needed for severe pain.   12/12/2021   Potassium 99 MG TABS Take 99 mg by mouth daily as needed (low potassium).   Past Week   sildenafil (VIAGRA) 100 MG tablet Take 100 mg by mouth daily as needed for erectile dysfunction.   unknown   sodium bicarbonate 650 MG tablet Take 650 mg by mouth 2 (two) times daily.  12/12/2021   ondansetron (ZOFRAN) 4 MG tablet Take 1 tablet (4 mg total) by mouth every 8 (eight) hours as needed for nausea or vomiting. 20 tablet 0    Allergies  Allergen Reactions   Other Hives    DUCK EGGS ONLY   Atenolol Diarrhea and Nausea Only    Pt unsure of allergy? Possible fainting?   Cymbalta [Duloxetine Hcl] Other (See Comments)    Pt states it shut his bladder down and could not urinate; states it made him lethargic as well.   Latex Itching   Penicillins Hives    Did it involve swelling of the face/tongue/throat, SOB, or low BP? Unknown Did it involve sudden or severe rash/hives, skin peeling, or any reaction on the inside of your mouth or nose? Unknown Did you need to seek medical attention at a hospital or doctor's office? yes When did  it last happen?      Childhood If all above answers are "NO", may proceed with cephalosporin use.    Social History   Tobacco Use   Smoking status: Former    Packs/day: 1.50    Years: 20.00    Total pack years: 30.00    Types: Cigarettes    Quit date: 11/12/2012    Years since quitting: 9.0    Passive exposure: Never   Smokeless tobacco: Never   Tobacco comments:    Pt smokes 1 pack a month and vapes CBD oil. ARJ 11/22/21  Substance Use Topics   Alcohol use: No    Family History  Problem Relation Age of Onset   Charcot-Marie-Tooth disease Mother    Heart disease Father    COPD Father    Arthritis Sister     Review of Systems Constitutional: positive for fatigue Gastrointestinal: positive for abdominal pain, constipation, and nausea Musculoskeletal:positive for myalgias  Objective:   Patient Vitals for the past 8 hrs:  BP Temp Temp src Pulse Resp SpO2  12/13/21 0800 (!) 128/90 97.7 F (36.5 C) Oral 90 18 96 %  12/13/21 0542 115/69 98.5 F (36.9 C) Oral 78 16 98 %  12/13/21 0204 107/69 98 F (36.7 C) Oral 71 16 100 %   I/O last 3 completed shifts: In: 923.3 [I.V.:923.3] Out: 600 [Urine:600] No intake/output data recorded.  Current Facility-Administered Medications  Medication Dose Route Frequency Provider Last Rate Last Admin   0.9 %  sodium chloride infusion   Intravenous Continuous Heath Lark D, DO 75 mL/hr at 12/13/21 0654 New Bag at 12/13/21 0654   acetaminophen (TYLENOL) tablet 650 mg  650 mg Oral Q6H PRN Bernadette Hoit, DO       Or   acetaminophen (TYLENOL) suppository 650 mg  650 mg Rectal Q6H PRN Adefeso, Oladapo, DO       amLODipine (NORVASC) tablet 2.5 mg  2.5 mg Oral Daily Adefeso, Oladapo, DO   2.5 mg at 12/13/21 1003   Chlorhexidine Gluconate Cloth 2 % PADS 6 each  6 each Topical Once Virl Cagey, MD       ciprofloxacin (CIPRO) IVPB 400 mg  400 mg Intravenous Q12H Adefeso, Oladapo, DO 200 mL/hr at 12/13/21 1011 400 mg at 12/13/21 1011    HYDROmorphone (DILAUDID) injection 0.5 mg  0.5 mg Intravenous Q4H PRN Adefeso, Oladapo, DO   0.5 mg at 12/13/21 0748   losartan (COZAAR) tablet 25 mg  25 mg Oral Daily Adefeso, Oladapo, DO   25 mg at 12/13/21 1002   metroNIDAZOLE (FLAGYL) IVPB 500 mg  500 mg Intravenous  Q12H Adefeso, Oladapo, DO       ondansetron (ZOFRAN) tablet 4 mg  4 mg Oral Q6H PRN Adefeso, Oladapo, DO       Or   ondansetron (ZOFRAN) injection 4 mg  4 mg Intravenous Q6H PRN Bernadette Hoit, DO        Physical Exam Vitals reviewed.  Constitutional:      General: He is not in acute distress. HENT:     Head: Normocephalic.  Eyes:     Extraocular Movements: Extraocular movements intact.  Cardiovascular:     Rate and Rhythm: Normal rate and regular rhythm.     Heart sounds: No murmur heard. Pulmonary:     Effort: Pulmonary effort is normal. No respiratory distress.     Breath sounds: Normal breath sounds.  Abdominal:     Palpations: Abdomen is soft.     Tenderness: There is abdominal tenderness. There is no guarding or rebound.     Comments: Bowel sounds normal. Abdomen is soft and non-distended. Tenderness to percussion and palpation in the RUQ. Positive murphy's sign. No rigidity, guarding, or rebound tenderness.  Skin:    General: Skin is warm and dry.  Neurological:     Mental Status: He is alert and oriented to person, place, and time.  Psychiatric:        Mood and Affect: Mood normal.     .  Data Review:  Results for orders placed or performed during the hospital encounter of 12/12/21 (from the past 48 hour(s))  Urinalysis, Routine w reflex microscopic     Status: Abnormal   Collection Time: 12/12/21  4:03 PM  Result Value Ref Range   Color, Urine YELLOW YELLOW   APPearance CLEAR CLEAR   Specific Gravity, Urine 1.009 1.005 - 1.030   pH 6.0 5.0 - 8.0   Glucose, UA NEGATIVE NEGATIVE mg/dL   Hgb urine dipstick MODERATE (A) NEGATIVE   Bilirubin Urine NEGATIVE NEGATIVE   Ketones, ur NEGATIVE NEGATIVE  mg/dL   Protein, ur 30 (A) NEGATIVE mg/dL   Nitrite NEGATIVE NEGATIVE   Leukocytes,Ua NEGATIVE NEGATIVE   RBC / HPF 0-5 0 - 5 RBC/hpf   WBC, UA 0-5 0 - 5 WBC/hpf   Bacteria, UA RARE (A) NONE SEEN   Mucus PRESENT    Hyaline Casts, UA PRESENT     Comment: Performed at [facility], [address] [place], [place] [address]  Lipase, blood     Status: None   Collection Time: 12/12/21  5:38 PM  Result Value Ref Range   Lipase 23 11 - 51 U/L    Comment: Performed at [facility], [address], [place], [place] [address]  Comprehensive metabolic panel     Status: Abnormal   Collection Time: 12/12/21  5:38 PM  Result Value Ref Range   Sodium 131 (L) 135 - 145 mmol/L   Potassium 3.2 (L) 3.5 - 5.1 mmol/L   Chloride 98 98 - 111 mmol/L   CO2 24 22 - 32 mmol/L   Glucose, Bld 95 70 - 99 mg/dL    Comment: Glucose reference range applies only to samples taken after fasting for at least 8 hours.   BUN 15 8 - 23 mg/dL   Creatinine, Ser 1.38 (H) 0.61 - 1.24 mg/dL   Calcium 8.6 (L) 8.9 - 10.3 mg/dL   Total Protein 7.2 6.5 - 8.1 g/dL   Albumin 3.5 3.5 - 5.0 g/dL   AST 9 (L) 15 - 41 U/L   ALT 11 0 -  44 U/L   Alkaline Phosphatase 91 38 - 126 U/L   Total Bilirubin 1.2 0.3 - 1.2 mg/dL   GFR, Estimated 56 (L) >60 mL/min    Comment: (NOTE) Calculated using the CKD-EPI Creatinine Equation (2021)    Anion gap 9 5 - 15    Comment: Performed at [facility], [address], [place], [place] [address]  CBC     Status: Abnormal   Collection Time: 12/12/21  5:38 PM  Result Value Ref Range   WBC 16.9 (H) 4.0 - 10.5 K/uL   RBC 4.43 4.22 - 5.81 MIL/uL   Hemoglobin 13.7 13.0 - 17.0 g/dL   HCT 39.5 39.0 - 52.0 %   MCV 89.2 80.0 - 100.0 fL   MCH 30.9 26.0 - 34.0 pg   MCHC 34.7 30.0 - 36.0 g/dL   RDW 11.9 11.5 - 15.5 %   Platelets 246 150 - 400 K/uL   nRBC 0.0 0.0 - 0.2 %    Comment: Performed at [facility], [address] [place], [place] [address]  Differential     Status: Abnormal   Collection  Time: 12/12/21  5:38 PM  Result Value Ref Range   Neutrophils Relative % 75 %   Neutro Abs 13.0 (H) 1.7 - 7.7 K/uL   Lymphocytes Relative 15 %   Lymphs Abs 2.5 0.7 - 4.0 K/uL   Monocytes Relative 9 %   Monocytes Absolute 1.5 (H) 0.1 - 1.0 K/uL   Eosinophils Relative 1 %   Eosinophils Absolute 0.1 0.0 - 0.5 K/uL   Basophils Relative 0 %   Basophils Absolute 0.1 0.0 - 0.1 K/uL   Immature Granulocytes 0 %   Abs Immature Granulocytes 0.06 0.00 - 0.07 K/uL    Comment: Performed at [facility], [address], [place], [place] [address]  Comprehensive metabolic panel     Status: Abnormal   Collection Time: 12/13/21  4:41 AM  Result Value Ref Range   Sodium 137 135 - 145 mmol/L   Potassium 4.1 3.5 - 5.1 mmol/L    Comment: DELTA CHECK NOTED   Chloride 106 98 - 111 mmol/L   CO2 23 22 - 32 mmol/L   Glucose, Bld 99 70 - 99 mg/dL    Comment: Glucose reference range applies only to samples taken after fasting for at least 8 hours.   BUN 14 8 - 23 mg/dL   Creatinine, Ser 1.36 (H) 0.61 - 1.24 mg/dL   Calcium 8.4 (L) 8.9 - 10.3 mg/dL   Total Protein 6.7 6.5 - 8.1 g/dL   Albumin 3.2 (L) 3.5 - 5.0 g/dL   AST 9 (L) 15 - 41 U/L   ALT 10 0 - 44 U/L   Alkaline Phosphatase 87 38 - 126 U/L   Total Bilirubin 1.2 0.3 - 1.2 mg/dL   GFR, Estimated 57 (L) >60 mL/min    Comment: (NOTE) Calculated using the CKD-EPI Creatinine Equation (2021)    Anion gap 8 5 - 15    Comment: Performed at [facility], [address] [place], [place] [address]  CBC     Status: Abnormal   Collection Time: 12/13/21  4:41 AM  Result Value Ref Range   WBC 11.4 (H) 4.0 - 10.5 K/uL   RBC 4.11 (L) 4.22 - 5.81 MIL/uL   Hemoglobin 12.7 (L) 13.0 - 17.0 g/dL   HCT 37.4 (L) 39.0 - 52.0 %   MCV 91.0 80.0 - 100.0 fL   MCH 30.9 26.0 - 34.0 pg  MCHC 34.0 30.0 - 36.0 g/dL   RDW 11.9 11.5 - 15.5 %   Platelets 228 150 - 400 K/uL   nRBC 0.0 0.0 - 0.2 %    Comment: Performed at [facility], [address], [place], [place]  [address]  Magnesium     Status: None   Collection Time: 12/13/21  4:41 AM  Result Value Ref Range   Magnesium 2.3 1.7 - 2.4 mg/dL    Comment: Performed at [facility], [address] [place], [place] [address]  Phosphorus     Status: Abnormal   Collection Time: 12/13/21  4:41 AM  Result Value Ref Range   Phosphorus 2.3 (L) 2.5 - 4.6 mg/dL    Comment: Performed at [facility], [address] [place], [place] [address]     Assessment:   Principal Problem:   Cholelithiasis with acute cholecystitis Active Problems:   Stage 3a chronic kidney disease ([place])   Leukocytosis   Hypokalemia   Hyponatremia   Lung cancer ([facility])   Chronic diastolic CHF (congestive heart failure) ([place])   Plan:   Mr.Tison is a 66 year old man with history of squamous cell lung cancer, MGUS, CKD3a admitted for cholelithiasis with acute cholecystitis.   Leukocytosis improved to 11.4. Electrolyte abnormalities normalized overnight with IV NS and K+ repletion. Hemoglobin down to 12.7 from 13.7, will continue to monitor. Laparoscopic cholecystectomy scheduled for today, patient expressed understanding with plan.  -PRN pain control and antiemetics - -NPO -Continue ciprofloxacin and metronidazole  -SCDs and lovenox -Discharge anticipated tomorrow

## 2021-12-13 NOTE — Progress Notes (Signed)
[facility]  Acute cholecystitis. Diet as tolerated. Hopefully home tomorrow.   Curlene Labrum, MD [facility] [address] [place], [place] [address] [phone] (office)

## 2021-12-13 NOTE — Transfer of Care (Signed)
Immediate Anesthesia Transfer of Care Note  Patient: Juan Yang  Procedure(s) Performed: LAPAROSCOPIC CHOLECYSTECTOMY (Abdomen)  Patient Location: PACU  Anesthesia Type:General  Level of Consciousness: drowsy and patient cooperative  Airway & Oxygen Therapy: Patient Spontanous Breathing  Post-op Assessment: Report given to RN, Post -op Vital signs reviewed and stable, and Patient moving all extremities X 4  Post vital signs: Reviewed  Last Vitals:  Vitals Value Taken Time  BP 135/83 12/13/21 1623  Temp    Pulse 90 12/13/21 1627  Resp 23 12/13/21 1627  SpO2 99 % 12/13/21 1627  Vitals shown include unvalidated device data.  Last Pain:  Vitals:   12/13/21 1237  TempSrc: Oral  PainSc: 7       Patients Stated Pain Goal: 2 (52/59/10 2890)  Complications: No notable events documented.

## 2021-12-13 NOTE — Progress Notes (Signed)
The proposed treatment discussed in conference is for discussion purpose only and is not a binding recommendation.  The patients have not been physically examined, or presented with their treatment options.  Therefore, final treatment plans cannot be decided.  

## 2021-12-13 NOTE — Anesthesia Procedure Notes (Signed)
Procedure Name: Intubation Date/Time: 12/13/2021 3:08 PM  Performed by: Jonna Munro, CRNAPre-anesthesia Checklist: Patient identified, Emergency Drugs available, Suction available, Patient being monitored and Timeout performed Patient Re-evaluated:Patient Re-evaluated prior to induction Oxygen Delivery Method: Circle system utilized Preoxygenation: Pre-oxygenation with 100% oxygen Induction Type: IV induction Ventilation: Mask ventilation without difficulty Laryngoscope Size: Mac and 3 Grade View: Grade I Tube type: Oral Tube size: 7.5 mm Number of attempts: 1 Airway Equipment and Method: Stylet Placement Confirmation: ETT inserted through vocal cords under direct vision, positive ETCO2, CO2 detector and breath sounds checked- equal and bilateral Secured at: 23 cm Tube secured with: Tape Dental Injury: Teeth and Oropharynx as per pre-operative assessment

## 2021-12-13 NOTE — Op Note (Signed)
Operative Note   Preoperative Diagnosis: Acute cholecystitis    Postoperative Diagnosis: Same   Procedure(s) Performed: Laparoscopic cholecystectomy   Surgeon: Ria Comment C. Constance Haw, MD   Assistants: No qualified resident was available   Anesthesia: General endotracheal   Anesthesiologist: Dr.Battula     Specimens: Gallbladder    Estimated Blood Loss: Minimal    Blood Replacement: None    Complications: None    Operative Findings: Distended gallbladder with inflammation and edema    Procedure: The patient was taken to the operating room and placed supine. General endotracheal anesthesia was induced. Intravenous antibiotics were administered per protocol. An orogastric tube positioned to decompress the stomach. The abdomen was prepared and draped in the usual sterile fashion.    A supraumbilical incision was made and a Veress technique was utilized to achieve pneumoperitoneum to 15 mmHg with carbon dioxide. A 11 mm optiview port was placed through the supraumbilical region, and a 10 mm 0-degree operative laparoscope was introduced. The area underlying the trocar and Veress needle were inspected and without evidence of injury.  Remaining trocars were placed under direct vision. Two 5 mm ports were placed in the right abdomen, between the anterior axillary and midclavicular line.  A final 11 mm port was placed through the mid-epigastrium, near the falciform ligament.    The gallbladder was too edematous and was decompressed showing hydrops.  From here the fundus was elevated cephalad and the infundibulum was retracted to the patient's right. The gallbladder/cystic duct junction was skeletonized. The cystic artery noted in the triangle of Calot and was also skeletonized.  We then continued liberal medial and lateral dissection until the critical view of safety was achieved.    The cystic duct and cystic artery were triply clipped and divided. The gallbladder was then dissected from the liver  bed with electrocautery. The specimen was placed in an Endopouch and was retrieved through the epigastric site.   Final inspection revealed acceptable hemostasis. Surgicel powder and Surgical SNOW was placed in the gallbladder bed.  Trocars were removed and pneumoperitoneum was released.  0 Vicryl fascial sutures were used to close the epigastric and umbilical port sites. Skin incisions were closed with 4-0 Monocryl subcuticular sutures and Dermabond. The patient was awakened from anesthesia and extubated without complication.    Curlene Labrum, MD [facility] [address] [place], [place] [address] [phone] (office)

## 2021-12-13 NOTE — Progress Notes (Signed)
PROGRESS NOTE    Juan Yang  HYI:[id number] DOB: 1955-08-18 DOA: 12/12/2021 PCP: Baruch Gouty, FNP   Brief Narrative:    Juan Yang is a 66 y.o. 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  He was noted to have cholelithiasis with acute cholecystitis and is planned to undergo laparoscopic cholecystectomy today.  Assessment & Plan:   Principal Problem:   Cholelithiasis with acute cholecystitis Active Problems:   Stage 3a chronic kidney disease (HCC)   Leukocytosis   Hypokalemia   Hyponatremia   Lung cancer (HCC)   Chronic diastolic CHF (congestive heart failure) (HCC)  Assessment and Plan:   Cholelithiasis with acute cholecystitis RUQ ultrasound was suggestive of cholelithiasis with acute cholecystitis Patient was started on IV ciprofloxacin and Flagyl, we shall continue with same at this time. Continue IV Dilaudid 0.5 mg every 4 hours as needed for moderate/severe pain Continue IV hydration Patient will be placed n.p.o. in anticipation for possible surgical intervention in the morning Appreciate general surgery recommendations for laparoscopic cholecystectomy today   Leukocytosis possibly due to above vs persistent leukocytosis Currently downtrending   Squamous cell carcinoma of the lung Patient was recently diagnosed and was referred to oncology clinic for management   Diastolic CHF Continue total input/output, daily weights and fluid restriction Echocardiogram done on 10/26 showed LVEF of 60 to 65%.  No RWMA.  G1 DD   Essential pretension Continue amlodipine, losartan   CKD 3A Creatinine 1.38 (creatinine is within baseline range) Renally adjust medications, avoid nephrotoxic  agents/dehydration/hypotension    DVT prophylaxis: SCDs Code Status: Full Family Communication: Wife at bedside 11/3 Disposition Plan:  Status is: Inpatient Remains inpatient appropriate because: Need for IV medications.   Consultants:  General surgery  Procedures:  None  Antimicrobials:  Anti-infectives (From admission, onward)    Start     Dose/Rate Route Frequency Ordered Stop   12/13/21 1130  ciprofloxacin (CIPRO) IVPB 400 mg  Status:  Discontinued        400 mg 200 mL/hr over 60 Minutes Intravenous On call to O.R. 12/13/21 1035 12/13/21 1037   12/13/21 1000  ciprofloxacin (CIPRO) IVPB 400 mg        400 mg 200 mL/hr over 60 Minutes Intravenous Every 12 hours 12/12/21 1947     12/13/21 1000  metroNIDAZOLE (FLAGYL) IVPB 500 mg        500 mg 100 mL/hr over 60 Minutes Intravenous Every 12 hours 12/12/21 1947     12/12/21 1915  ciprofloxacin (CIPRO) IVPB 400 mg       See Hyperspace for full Linked Orders Report.   400 mg 200 mL/hr over 60 Minutes Intravenous  Once 12/12/21 1907 12/12/21 2041   12/12/21 1915  metroNIDAZOLE (FLAGYL) IVPB 500 mg       See Hyperspace for full Linked Orders Report.   500 mg 100 mL/hr over 60 Minutes Intravenous  Once 12/12/21 1907 12/12/21 2041       Subjective: Patient seen and evaluated today with ongoing right upper quadrant pain which seems to be alleviated with pain medications.  No significant nausea or vomiting noted.  Objective: Vitals:   12/13/21 0204 12/13/21 0542 12/13/21 0800 12/13/21 1003  BP: 107/69 115/69 (!) 128/90 (!) 133/93  Pulse: 71 78 90   Resp:  16 16 18    Temp: 98 F (36.7 C) 98.5 F (36.9 C) 97.7 F (36.5 C)   TempSrc: Oral Oral Oral   SpO2: 100% 98% 96%     Intake/Output Summary (Last 24 hours) at 12/13/2021 1036 Last data filed at 12/13/2021 0654 Gross per 24 hour  Intake 923.33 ml  Output 600 ml  Net 323.33 ml   There were no vitals filed for this visit.  Examination:  General exam: Appears calm  and comfortable  Respiratory system: Clear to auscultation. Respiratory effort normal. Cardiovascular system: S1 & S2 heard, RRR.  Gastrointestinal system: Abdomen is soft, tender to palpation over right upper quadrant Central nervous system: Alert and awake Extremities: No edema Skin: No significant lesions noted Psychiatry: Flat affect.    Data Reviewed: I have personally reviewed following labs and imaging studies  CBC: Recent Labs  Lab 12/12/21 0926 12/12/21 1738 12/13/21 0441  WBC 17.7* 16.9* 11.4*  NEUTROABS 13.7* 13.0*  --   HGB 13.3 13.7 12.7*  HCT 39.4 39.5 37.4*  MCV 91 89.2 91.0  PLT 280 246 374   Basic Metabolic Panel: Recent Labs  Lab 12/12/21 0926 12/12/21 1738 12/13/21 0441  NA 136 131* 137  K 3.9 3.2* 4.1  CL 96 98 106  CO2 21 24 23   GLUCOSE 109* 95 99  BUN 11 15 14   CREATININE 1.45* 1.38* 1.36*  CALCIUM 9.0 8.6* 8.4*  MG  --   --  2.3  PHOS  --   --  2.3*   GFR: Estimated Creatinine Clearance: 60.4 mL/min (A) (by C-G formula based on SCr of 1.36 mg/dL (H)). Liver Function Tests: Recent Labs  Lab 12/12/21 0926 12/12/21 1738 12/13/21 0441  AST 7 9* 9*  ALT 9 11 10   ALKPHOS 114 91 87  BILITOT 1.2 1.2 1.2  PROT 6.4 7.2 6.7  ALBUMIN 4.1 3.5 3.2*   Recent Labs  Lab 12/12/21 0926 12/12/21 1738  LIPASE 18 23  AMYLASE 14*  --    No results for input(s): "AMMONIA" in the last 168 hours. Coagulation Profile: No results for input(s): "INR", "PROTIME" in the last 168 hours. Cardiac Enzymes: No results for input(s): "CKTOTAL", "CKMB", "CKMBINDEX", "TROPONINI" in the last 168 hours. BNP (last 3 results) No results for input(s): "PROBNP" in the last 8760 hours. HbA1C: No results for input(s): "HGBA1C" in the last 72 hours. CBG: No results for input(s): "GLUCAP" in the last 168 hours. Lipid Profile: No results for input(s): "CHOL", "HDL", "LDLCALC", "TRIG", "CHOLHDL", "LDLDIRECT" in the last 72 hours. Thyroid Function Tests: No results  for input(s): "TSH", "T4TOTAL", "FREET4", "T3FREE", "THYROIDAB" in the last 72 hours. Anemia Panel: No results for input(s): "VITAMINB12", "FOLATE", "FERRITIN", "TIBC", "IRON", "RETICCTPCT" in the last 72 hours. Sepsis Labs: No results for input(s): "PROCALCITON", "LATICACIDVEN" in the last 168 hours.  No results found for this or any previous visit (from the past 240 hour(s)).       Radiology Studies: US Abdomen Limited RUQ (LIVER/GB)  Result Date: 12/12/2021 CLINICAL DATA:  Right upper quadrant pain, nausea EXAM: ULTRASOUND ABDOMEN LIMITED RIGHT UPPER QUADRANT COMPARISON:  None Available. FINDINGS: Gallbladder: Cholelithiasis with the largest measuring 29.5 mm. Gallbladder sludge. Gallbladder wall thickening measuring 6.5 mm. Positive sonographic Murphy sign. No pericholecystic fluid. Common bile duct: Diameter: 4.9 mm Liver: No focal lesion identified. Increased hepatic parenchymal echogenicity. Portal vein is patent on color Doppler imaging with normal direction of blood flow towards the liver. Other: 3.86 x 4.6 x 3.3 cm right upper  pole renal cyst. IMPRESSION: 1. Cholelithiasis with gallbladder wall thickening and positive sonographic Murphy sign concerning for acute cholecystitis. 2. Increased hepatic echogenicity as can be seen with hepatic steatosis. Electronically Signed   By: Kathreen Devoid M.D.   On: 12/12/2021 10:50        Scheduled Meds:  amLODipine  2.5 mg Oral Daily   Chlorhexidine Gluconate Cloth  6 each Topical Once   losartan  25 mg Oral Daily   Continuous Infusions:  sodium chloride 75 mL/hr at 12/13/21 0654   ciprofloxacin 400 mg (12/13/21 1011)   ciprofloxacin     metronidazole       LOS: 1 day    Time spent: 35 minutes    Darnise Montag Darleen Crocker, DO [facility]  If 7PM-7AM, please contact night-coverage [email or web address] 12/13/2021, 10:36 AM

## 2021-12-13 NOTE — Anesthesia Preprocedure Evaluation (Addendum)
Anesthesia Evaluation  Patient identified by MRN, date of birth, ID band Patient awake    Reviewed: Allergy & Precautions, H&P , NPO status , Patient's Chart, lab work & pertinent test results  Airway Mallampati: I  TM Distance: >3 FB Neck ROM: Full    Dental  (+) Dental Advisory Given, Missing, Poor Dentition, Chipped   Pulmonary shortness of breath and with exertion, pneumonia, resolved, former smoker Lung cancer   Pulmonary exam normal breath sounds clear to auscultation       Cardiovascular hypertension, Pt. 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 Right lower lobe and right hilar lesions as detailed above. These appear to have been present since 2021 but slowly enlarging. 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 2. No mediastinal adenopathy. 3. Age advanced three-vessel coronary artery calcifications. 4. Abnormal gallbladder. Recommend right upper quadrant ultrasound examination for further evaluation. 5. Emphysema. 6. No obvious lytic myelomatous lesions.   Aortic Atherosclerosis (ICD10-I70.0) and Emphysema (ICD10-J43.9).     Electronically Signed   By: Marijo Sanes M.D.   On: 11/29/2021 07:58   Reproductive/Obstetrics negative OB ROS                             Anesthesia Physical Anesthesia Plan  ASA: 3  Anesthesia Plan: General    Post-op Pain Management: Dilaudid IV   Induction: Intravenous  PONV Risk Score and Plan: 4 or greater and Ondansetron, Dexamethasone and Midazolam  Airway Management Planned: Oral ETT  Additional Equipment:   Intra-op Plan:   Post-operative Plan: Extubation in OR and Possible Post-op intubation/ventilation  Informed Consent: I have reviewed the patients History and Physical, chart, labs and discussed the procedure including the risks, benefits and alternatives for the proposed anesthesia with the patient or authorized representative who has indicated his/her understanding and acceptance.     Dental advisory given  Plan Discussed with: CRNA and Surgeon  Anesthesia Plan Comments:        Anesthesia Quick Evaluation

## 2021-12-13 NOTE — TOC Progression Note (Signed)
  Transition of Care [facility]) Screening Note   Patient Details  Name: Juan Yang Date of Birth: Apr 26, 1955   Transition of Care [facility]) CM/SW Contact:    Boneta Lucks, RN Phone Number: 12/13/2021, 11:49 AM    Transition of Care Department [facility]) has reviewed patient and no TOC needs have been identified at this time. We will continue to monitor patient advancement through interdisciplinary progression rounds. If new patient transition needs arise, please place a TOC consult.    Expected Discharge Plan: Home/Self Care Barriers to Discharge: Continued Medical Work up  Expected Discharge Plan and Services Expected Discharge Plan: Home/Self Care       Living arrangements for the past 2 months: [place]

## 2021-12-13 NOTE — Anesthesia Postprocedure Evaluation (Signed)
Anesthesia Post Note  Patient: Juan Yang  Procedure(s) Performed: LAPAROSCOPIC CHOLECYSTECTOMY (Abdomen)  Patient location during evaluation: PACU Anesthesia Type: General Level of consciousness: awake and alert and oriented Pain management: pain level controlled Vital Signs Assessment: post-procedure vital signs reviewed and stable Respiratory status: spontaneous breathing, nonlabored ventilation and respiratory function stable Cardiovascular status: blood pressure returned to baseline and stable Postop Assessment: no apparent nausea or vomiting Anesthetic complications: no   No notable events documented.   Last Vitals:  Vitals:   12/13/21 1700 12/13/21 1720  BP: 128/88 (!) 123/90  Pulse: 93 96  Resp: 13 16  Temp:    SpO2: 93% 96%    Last Pain:  Vitals:   12/13/21 1720  TempSrc:   PainSc: 0-No pain                 Mariyam Remington C Loranda Mastel

## 2021-12-13 NOTE — Care Management Important Message (Signed)
Important Message  Patient Details  Name: Juan Yang MRN: [id number] Date of Birth: 04-May-1955   Medicare Important Message Given:  Yes     Tommy Medal 12/13/2021, 11:38 AM

## 2021-12-14 DIAGNOSIS — K8000 Calculus of gallbladder with acute cholecystitis without obstruction: Secondary | ICD-10-CM

## 2021-12-14 LAB — CBC
HCT: 37.7 % — ABNORMAL LOW (ref 39.0–52.0)
Hemoglobin: 12.9 g/dL — ABNORMAL LOW (ref 13.0–17.0)
MCH: 30.6 pg (ref 26.0–34.0)
MCHC: 34.2 g/dL (ref 30.0–36.0)
MCV: 89.3 fL (ref 80.0–100.0)
Platelets: 246 10*3/uL (ref 150–400)
RBC: 4.22 MIL/uL (ref 4.22–5.81)
RDW: 11.9 % (ref 11.5–15.5)
WBC: 10.7 10*3/uL — ABNORMAL HIGH (ref 4.0–10.5)
nRBC: 0 % (ref 0.0–0.2)

## 2021-12-14 LAB — COMPREHENSIVE METABOLIC PANEL
ALT: 35 U/L (ref 0–44)
AST: 49 U/L — ABNORMAL HIGH (ref 15–41)
Albumin: 3.3 g/dL — ABNORMAL LOW (ref 3.5–5.0)
Alkaline Phosphatase: 93 U/L (ref 38–126)
Anion gap: 8 (ref 5–15)
BUN: 10 mg/dL (ref 8–23)
CO2: 22 mmol/L (ref 22–32)
Calcium: 8.8 mg/dL — ABNORMAL LOW (ref 8.9–10.3)
Chloride: 108 mmol/L (ref 98–111)
Creatinine, Ser: 1.07 mg/dL (ref 0.61–1.24)
GFR, Estimated: >60 mL/min (ref >60)
Glucose, Bld: 166 mg/dL — ABNORMAL HIGH (ref 70–99)
Potassium: 3.9 mmol/L (ref 3.5–5.1)
Sodium: 138 mmol/L (ref 135–145)
Total Bilirubin: 0.7 mg/dL (ref 0.3–1.2)
Total Protein: 7.1 g/dL (ref 6.5–8.1)

## 2021-12-14 LAB — MAGNESIUM: Magnesium: 2.2 mg/dL (ref 1.7–2.4)

## 2021-12-14 MED ORDER — OXYCODONE HCL 5 MG PO TABS
5.0000 mg | ORAL_TABLET | ORAL | Status: DC | PRN
  Administered 2021-12-14: 10 mg via ORAL
  Administered 2021-12-14: 5 mg via ORAL
  Administered 2021-12-15: 10 mg via ORAL
  Filled 2021-12-14: qty 1
  Filled 2021-12-14 (×2): qty 2

## 2021-12-14 MED ORDER — SODIUM BICARBONATE 650 MG PO TABS
650.0000 mg | ORAL_TABLET | Freq: Two times a day (BID) | ORAL | Status: DC
  Administered 2021-12-14 – 2021-12-15 (×3): 650 mg via ORAL
  Filled 2021-12-14 (×3): qty 1

## 2021-12-14 MED ORDER — METOPROLOL SUCCINATE ER 25 MG PO TB24
25.0000 mg | ORAL_TABLET | Freq: Every day | ORAL | Status: DC
  Administered 2021-12-14 – 2021-12-15 (×2): 25 mg via ORAL
  Filled 2021-12-14 (×2): qty 1

## 2021-12-14 MED ORDER — HYDROMORPHONE HCL 1 MG/ML IJ SOLN
1.0000 mg | INTRAMUSCULAR | Status: DC | PRN
  Administered 2021-12-14 – 2021-12-15 (×5): 1 mg via INTRAVENOUS
  Filled 2021-12-14 (×5): qty 1

## 2021-12-14 MED ORDER — CYCLOBENZAPRINE HCL 10 MG PO TABS: 10.0000 mg | ORAL_TABLET | Freq: Three times a day (TID) | ORAL | Status: DC | PRN

## 2021-12-14 MED ORDER — OXYCODONE HCL 5 MG PO TABS: 10.0000 mg | ORAL_TABLET | ORAL | Status: DC | PRN

## 2021-12-14 NOTE — Plan of Care (Signed)

## 2021-12-14 NOTE — Progress Notes (Signed)
Patient told nursing student that he was going to get his wife to bring his percocet from home and that he was going to take it because we are not controlling his pain. This nurse explained to the patient that he can not take any at home medication while he is here. MD Manuella Ghazi changed the dilaudid order so the patient can have it more frequent and a higher dose. Patient was notified of this.

## 2021-12-14 NOTE — Progress Notes (Addendum)
[facility]  Pain at the epigastric site requiring a lot of meds.   BP 136/88   Pulse 93   Temp 98.2 F (36.8 C) (Oral)   Resp 20   Ht 6' (1.829 m)   Wt 92.1 kg   SpO2 97%   BMI 27.53 kg/m  Incisions healing, dermabond with no erythema or drainage, bruising in the umbilical site  Patient s/p laparoscopic cholecystectomy for acute cholecystitis. H&H stable, WBC down. AST up to 49 likely from the cautery on the liver.  Diet as tolerated PRN for pain Hopefully home in next 24-48 hours as pain improves, had a large gallbladder and large incision/ cut on muscle at the epigastric region Cipro flagyl can stop today  Curlene Labrum, MD [facility] [place], [place] [phone] (office)

## 2021-12-14 NOTE — Discharge Instructions (Signed)
Discharge Laparoscopic Surgery Instructions:  Common Complaints: Right shoulder pain is common after laparoscopic surgery. This is secondary to the gas used in the surgery being trapped under the diaphragm.  Walk to help your body absorb the gas. This will improve in a few days. Pain at the port sites are common, especially the larger port sites. This will improve with time.  Some nausea is common and poor appetite. The main goal is to stay hydrated the first few days after surgery.   Diet/ Activity: Diet as tolerated. You may not have an appetite, but it is important to stay hydrated. Drink 64 ounces of water a day. Your appetite will return with time.  Shower per your regular routine daily.  Do not take hot showers. Take warm showers that are less than 10 minutes. Rest and listen to your body, but do not remain in bed all day.  Walk everyday for at least 15-20 minutes. Deep cough and move around every 1-2 hours in the first few days after surgery.  Do not lift > 10 lbs, perform excessive bending, pushing, pulling, squatting for 1-2 weeks after surgery.  Do not pick at the dermabond glue on your incision sites.  This glue film will remain in place for 1-2 weeks and will start to peel off.  Do not place lotions or balms on your incision unless instructed to specifically by Dr. Caledonia Zou.   Pain Expectations and Narcotics: -After surgery you will have pain associated with your incisions and this is normal. The pain is muscular and nerve pain, and will get better with time. -You are encouraged and expected to take non narcotic medications like tylenol and ibuprofen (when able) to treat pain as multiple modalities can aid with pain treatment. -Narcotics are only used when pain is severe or there is breakthrough pain. -You are not expected to have a pain score of 0 after surgery, as we cannot prevent pain. A pain score of 3-4 that allows you to be functional, move, walk, and tolerate some activity is  the goal. The pain will continue to improve over the days after surgery and is dependent on your surgery. -Due to [place] law, we are only able to give a certain amount of pain medication to treat post operative pain, and we only give additional narcotics on a patient by patient basis.  -For most laparoscopic surgery, studies have shown that the majority of patients only need 10-15 narcotic pills, and for open surgeries most patients only need 15-20.   -Having appropriate expectations of pain and knowledge of pain management with non narcotics is important as we do not want anyone to become addicted to narcotic pain medication.  -Using ice packs in the first 48 hours and heating pads after 48 hours, wearing an abdominal binder (when recommended), and using over the counter medications are all ways to help with pain management.   -Simple acts like meditation and mindfulness practices after surgery can also help with pain control and research has proven the benefit of these practices.  Medication: Take tylenol and ibuprofen as needed for pain control, alternating every 4-6 hours.  Example:  Tylenol 1000mg @ 6am, 12noon, 6pm, 12midnight (Do not exceed 4000mg of tylenol a day). Ibuprofen 800mg @ 9am, 3pm, 9pm, 3am (Do not exceed 3600mg of ibuprofen a day).  Take Roxicodone for breakthrough pain every 4 hours.  Take Colace for constipation related to narcotic pain medication. If you do not have a bowel movement in 2 days, take Miralax   over the counter.  Drink plenty of water to also prevent constipation.   Contact Information: If you have questions or concerns, please call our office, [phone], Monday- Thursday 8AM-5PM and Friday 8AM-12Noon.  If it is after hours or on the weekend, please call [facility] Main Number, [phone], [phone], and ask to speak to the surgeon on call for Dr. Vasco Chong at [place].   

## 2021-12-14 NOTE — Progress Notes (Signed)
PROGRESS NOTE    Juan Yang  YWV:[id number] DOB: December 12, 1955 DOA: 12/12/2021 PCP: Baruch Gouty, FNP   Brief Narrative:    Juan Yang is a 66 y.o. 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  He was noted to have cholelithiasis with acute cholecystitis and underwent laparoscopic cholecystectomy 11/3.  Assessment & Plan:   Principal Problem:   Calculus of gallbladder with acute cholecystitis without obstruction Active Problems:   Stage 3a chronic kidney disease (HCC)   Leukocytosis   Hypokalemia   Hyponatremia   Lung cancer (HCC)   Chronic diastolic CHF (congestive heart failure) (HCC)  Assessment and Plan:  Cholelithiasis with acute cholecystitis RUQ ultrasound was suggestive of cholelithiasis with acute cholecystitis Patient was started on IV ciprofloxacin and Flagyl, we shall continue with same at this time. Increase IV Dilaudid to 1 mg every 3 hours due to severity of pain and resume home muscle relaxers Advance diet from clear liquid per general surgery Patient continues to have ongoing severe pain at this time requiring IV medications for pain management   Leukocytosis possibly due to above vs persistent leukocytosis Currently downtrending   Squamous cell carcinoma of the lung Patient was recently diagnosed and was referred to oncology clinic for management   Diastolic CHF Continue total input/output, daily weights and fluid restriction Echocardiogram done on 10/26 showed LVEF of 60 to 65%.  No RWMA.  G1 DD   Essential pretension Continue amlodipine, losartan   CKD 3A Creatinine 1.07 (creatinine is within baseline range) Renally adjust medications, avoid nephrotoxic  agents/dehydration/hypotension  History of chronic pain Chronic narcotic use at home with some tolerance to pain medication Increasing IV Dilaudid as noted above     DVT prophylaxis: SCDs Code Status: Full Family Communication: Wife at bedside 11/3 Disposition Plan:  Status is: Inpatient Remains inpatient appropriate because: Need for IV medications.     Consultants:  General surgery   Procedures:  None   Antimicrobials:  Anti-infectives (From admission, onward)    Start     Dose/Rate Route Frequency Ordered Stop   12/13/21 1301  ciprofloxacin (CIPRO) 400 MG/200ML IVPB       Note to Pharmacy: Abbie Sons S: cabinet override      12/13/21 1301 12/13/21 1557   12/13/21 1130  ciprofloxacin (CIPRO) IVPB 400 mg  Status:  Discontinued        400 mg 200 mL/hr over 60 Minutes Intravenous On call to O.R. 12/13/21 1035 12/13/21 1037   12/13/21 1000  ciprofloxacin (CIPRO) IVPB 400 mg        400 mg 200 mL/hr over 60 Minutes Intravenous Every 12 hours 12/12/21 1947     12/13/21 1000  metroNIDAZOLE (FLAGYL) IVPB 500 mg        500 mg 100 mL/hr over 60 Minutes Intravenous Every 12 hours 12/12/21 1947     12/12/21 1915  ciprofloxacin (CIPRO) IVPB 400 mg       See Hyperspace for full Linked Orders Report.   400 mg 200 mL/hr over 60 Minutes Intravenous  Once 12/12/21 1907 12/12/21 2041   12/12/21 1915  metroNIDAZOLE (FLAGYL) IVPB 500 mg       See Hyperspace for full Linked Orders Report.   500 mg 100 mL/hr over 60 Minutes Intravenous  Once 12/12/21  1907 12/12/21 2041      Subjective: Patient seen and evaluated today with severe abdominal pain noted this morning.  He is doubled over and stating that IV Dilaudid at the current dose is not helping.  He feels as though he has quite a bit of bloating and gas and ambulation is not helping very much.  He denies any nausea or vomiting.  Objective: Vitals:   12/13/21 2003 12/14/21 0009 12/14/21 0402 12/14/21 0900  BP: 122/86 (!) 136/96  (!) 149/97 (!) 140/109  Pulse: (!) 103 93 96   Resp: 14 14 13    Temp: (!) 97.5 F (36.4 C) 97.6 F (36.4 C) (!) 97.5 F (36.4 C)   TempSrc: Oral Oral Oral   SpO2: 94% 97% 99%   Weight:      Height:        Intake/Output Summary (Last 24 hours) at 12/14/2021 1250 Last data filed at 12/14/2021 0958 Gross per 24 hour  Intake 1960 ml  Output 3185 ml  Net -1225 ml   Filed Weights   12/13/21 1237  Weight: 92.1 kg    Examination:  General exam: Appears to be in mild/moderate distress Respiratory system: Clear to auscultation. Respiratory effort normal. Cardiovascular system: S1 & S2 heard, RRR.  Gastrointestinal system: Abdomen is tender to palpation with incisions C/D/I Central nervous system: Alert and awake Extremities: No edema Skin: No significant lesions noted Psychiatry: Flat affect.    Data Reviewed: I have personally reviewed following labs and imaging studies  CBC: Recent Labs  Lab 12/12/21 0926 12/12/21 1738 12/13/21 0441 12/14/21 0352  WBC 17.7* 16.9* 11.4* 10.7*  NEUTROABS 13.7* 13.0*  --   --   HGB 13.3 13.7 12.7* 12.9*  HCT 39.4 39.5 37.4* 37.7*  MCV 91 89.2 91.0 89.3  PLT 280 246 228 202   Basic Metabolic Panel: Recent Labs  Lab 12/12/21 0926 12/12/21 1738 12/13/21 0441 12/14/21 0352  NA 136 131* 137 138  K 3.9 3.2* 4.1 3.9  CL 96 98 106 108  CO2 21 24 23 22   GLUCOSE 109* 95 99 166*  BUN 11 15 14 10   CREATININE 1.45* 1.38* 1.36* 1.07  CALCIUM 9.0 8.6* 8.4* 8.8*  MG  --   --  2.3 2.2  PHOS  --   --  2.3*  --    GFR: Estimated Creatinine Clearance: 74.5 mL/min (by C-G formula based on SCr of 1.07 mg/dL). Liver Function Tests: Recent Labs  Lab 12/12/21 0926 12/12/21 1738 12/13/21 0441 12/14/21 0352  AST 7 9* 9* 49*  ALT 9 11 10  35  ALKPHOS 114 91 87 93  BILITOT 1.2 1.2 1.2 0.7  PROT 6.4 7.2 6.7 7.1  ALBUMIN 4.1 3.5 3.2* 3.3*   Recent Labs  Lab 12/12/21 0926 12/12/21 1738  LIPASE 18 23  AMYLASE 14*  --    No results for  input(s): "AMMONIA" in the last 168 hours. Coagulation Profile: No results for input(s): "INR", "PROTIME" in the last 168 hours. Cardiac Enzymes: No results for input(s): "CKTOTAL", "CKMB", "CKMBINDEX", "TROPONINI" in the last 168 hours. BNP (last 3 results) No results for input(s): "PROBNP" in the last 8760 hours. HbA1C: No results for input(s): "HGBA1C" in the last 72 hours. CBG: No results for input(s): "GLUCAP" in the last 168 hours. Lipid Profile: No results for input(s): "CHOL", "HDL", "LDLCALC", "TRIG", "CHOLHDL", "LDLDIRECT" in the last 72 hours. Thyroid Function Tests: No results for input(s): "TSH", "T4TOTAL", "FREET4", "T3FREE", "THYROIDAB" in the last 72 hours. Anemia Panel:  No results for input(s): "VITAMINB12", "FOLATE", "FERRITIN", "TIBC", "IRON", "RETICCTPCT" in the last 72 hours. Sepsis Labs: No results for input(s): "PROCALCITON", "LATICACIDVEN" in the last 168 hours.  No results found for this or any previous visit (from the past 240 hour(s)).       Radiology Studies: No results found.      Scheduled Meds:  amLODipine  2.5 mg Oral Daily   losartan  25 mg Oral Daily   metoprolol succinate  25 mg Oral Daily   sodium bicarbonate  650 mg Oral BID   Continuous Infusions:  ciprofloxacin 400 mg (12/14/21 1124)   metronidazole 500 mg (12/14/21 0925)     LOS: 2 days    Time spent: 35 minutes    Dimitriy Carreras D Manuella Ghazi, DO [facility]  If 7PM-7AM, please contact night-coverage [email or web address] 12/14/2021, 12:50 PM

## 2021-12-15 DIAGNOSIS — K8000 Calculus of gallbladder with acute cholecystitis without obstruction: Secondary | ICD-10-CM | POA: Diagnosis not present

## 2021-12-15 LAB — CBC
HCT: 37.5 % — ABNORMAL LOW (ref 39.0–52.0)
Hemoglobin: 12.6 g/dL — ABNORMAL LOW (ref 13.0–17.0)
MCH: 31.2 pg (ref 26.0–34.0)
MCHC: 33.6 g/dL (ref 30.0–36.0)
MCV: 92.8 fL (ref 80.0–100.0)
Platelets: 307 10*3/uL (ref 150–400)
RBC: 4.04 MIL/uL — ABNORMAL LOW (ref 4.22–5.81)
RDW: 12.1 % (ref 11.5–15.5)
WBC: 17.9 10*3/uL — ABNORMAL HIGH (ref 4.0–10.5)
nRBC: 0 % (ref 0.0–0.2)

## 2021-12-15 LAB — COMPREHENSIVE METABOLIC PANEL
ALT: 33 U/L (ref 0–44)
AST: 33 U/L (ref 15–41)
Albumin: 3.5 g/dL (ref 3.5–5.0)
Alkaline Phosphatase: 89 U/L (ref 38–126)
Anion gap: 11 (ref 5–15)
BUN: 13 mg/dL (ref 8–23)
CO2: 21 mmol/L — ABNORMAL LOW (ref 22–32)
Calcium: 8.7 mg/dL — ABNORMAL LOW (ref 8.9–10.3)
Chloride: 106 mmol/L (ref 98–111)
Creatinine, Ser: 1.09 mg/dL (ref 0.61–1.24)
GFR, Estimated: >60 mL/min (ref >60)
Glucose, Bld: 98 mg/dL (ref 70–99)
Potassium: 3.8 mmol/L (ref 3.5–5.1)
Sodium: 138 mmol/L (ref 135–145)
Total Bilirubin: 0.5 mg/dL (ref 0.3–1.2)
Total Protein: 7.2 g/dL (ref 6.5–8.1)

## 2021-12-15 LAB — MAGNESIUM: Magnesium: 2.3 mg/dL (ref 1.7–2.4)

## 2021-12-15 MED ORDER — OXYCODONE HCL 5 MG PO TABS: 5.0000 mg | ORAL_TABLET | ORAL | 0 refills | Status: DC | PRN

## 2021-12-15 NOTE — Progress Notes (Signed)
[facility]  Doing well per report. Can eat and dc home. Monitor  for any fevers or issues. I think wbc is reactive. Will do a call on 11/16.  Curlene Labrum, MD [facility] [address] [place], [place] [address] [phone] (office)

## 2021-12-15 NOTE — Discharge Summary (Signed)
Physician Discharge Summary  [name]:[id number] DOB: 10-29-1955 DOA: 12/12/2021  PCP: Baruch Gouty, FNP  Admit date: 12/12/2021  Discharge date: 12/15/2021  Admitted From:Home  Disposition:  Home  Recommendations for Outpatient Follow-up:  Follow up with PCP in 1-2 weeks and repeat CBC in 1 week to ensure leukocytosis resolves Follow-up with general surgery, Dr. Constance Haw as scheduled 11/16 Extra pain medications provided as noted below with 0 refills to help with pain management temporarily Continue other home medications as prior  Home Health: None  Equipment/Devices: None  Discharge Condition:Stable  CODE STATUS: Full  Diet recommendation: Heart Healthy  Brief/Interim Summary:  Juan Yang is a 66 y.o. 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  He was noted to have cholelithiasis with acute cholecystitis and underwent laparoscopic cholecystectomy 11/3.  He had some worsening incisional pain that increased his length of stay through 11/4.  He now has better pain control and is tolerating diet and is stable for discharge.  No other acute events or concerns noted throughout the course of this admission.  Discharge Diagnoses:  Principal Problem:   Calculus of gallbladder with acute cholecystitis without obstruction Active Problems:   Stage 3a chronic kidney disease (HCC)   Leukocytosis   Hypokalemia   Hyponatremia   Lung cancer (HCC)   Chronic diastolic CHF (congestive heart failure) ([place])  Principal discharge diagnosis: Cholelithiasis with acute cholecystitis status post laparoscopic cholecystectomy 11/3.  Discharge Instructions  Discharge Instructions     Diet - low sodium heart  healthy   Complete by: As directed    Increase activity slowly   Complete by: As directed    No wound care   Complete by: As directed       Allergies as of 12/15/2021       Reactions   Other Hives   DUCK EGGS ONLY   Atenolol Diarrhea, Nausea Only   Pt unsure of allergy? Possible fainting?   Cymbalta [duloxetine Hcl] Other (See Comments)   Pt states it shut his bladder down and could not urinate; states it made him lethargic as well.   Latex Itching   Penicillins Hives   Did it involve swelling of the face/tongue/throat, SOB, or low BP? Unknown Did it involve sudden or severe rash/hives, skin peeling, or any reaction on the inside of your mouth or nose? Unknown Did you need to seek medical attention at a hospital or doctor's office? yes When did it last happen?      Childhood If all above answers are "NO", may proceed with cephalosporin use.        Medication List     TAKE these medications    Adult Aspirin Regimen 81 MG tablet Generic drug: aspirin EC Take 81 mg by mouth daily.   amLODipine 2.5 MG tablet Commonly known as: NORVASC Take 2.5 mg by mouth 2 (two) times daily.   calcium carbonate 500 MG chewable tablet Commonly known as: TUMS - dosed in mg elemental calcium Chew 2 tablets by mouth daily.   clobetasol cream 0.05 % Commonly known as: TEMOVATE Apply 1 application topically 2 (two) times daily as needed (rash).   cyclobenzaprine 10 MG tablet Commonly known as: FLEXERIL Take 1 tablet (10 mg total) by mouth 3 (three) times daily as needed  for muscle spasms.   GLUCOSAMINE-MSM PO Take 1 tablet by mouth daily.   losartan 25 MG tablet Commonly known as: COZAAR Take 25 mg by mouth daily.   metoprolol succinate 25 MG 24 hr tablet Commonly known as: TOPROL-XL Take 1 tablet (25 mg total) by mouth daily.   ondansetron 4 MG tablet Commonly known as: Zofran Take 1 tablet (4 mg total) by mouth every 8 (eight) hours as needed for nausea or vomiting.    oxyCODONE 5 MG immediate release tablet Commonly known as: Oxy IR/ROXICODONE Take 5 mg by mouth every 4 (four) hours as needed for severe pain. What changed: Another medication with the same name was added. Make sure you understand how and when to take each.   oxyCODONE 5 MG immediate release tablet Commonly known as: Oxy IR/ROXICODONE Take 1-2 tablets (5-10 mg total) by mouth every 4 (four) hours as needed for breakthrough pain or severe pain. What changed: You were already taking a medication with the same name, and this prescription was added. Make sure you understand how and when to take each.   Potassium 99 MG Tabs Take 99 mg by mouth daily as needed (low potassium).   sildenafil 100 MG tablet Commonly known as: VIAGRA Take 100 mg by mouth daily as needed for erectile dysfunction.   sodium bicarbonate 650 MG tablet Take 650 mg by mouth 2 (two) times daily.        Follow-up Information     Virl Cagey, MD Follow up on 12/26/2021.   Specialty: General Surgery Why: post op phone call, if you need to be seen in person call the office Contact information: [address] [facility] [address] [phone]         Baruch Gouty, [place]. Schedule an appointment as soon as possible for a visit in 1 week(s).   Specialty: Family Medicine Contact information: [place] [address] [phone]                Allergies  Allergen Reactions   Other Hives    DUCK EGGS ONLY   Atenolol Diarrhea and Nausea Only    Pt unsure of allergy? Possible fainting?   Cymbalta [Duloxetine Hcl] Other (See Comments)    Pt states it shut his bladder down and could not urinate; states it made him lethargic as well.   Latex Itching   Penicillins Hives    Did it involve swelling of the face/tongue/throat, SOB, or low BP? Unknown Did it involve sudden or severe rash/hives, skin peeling, or any reaction on the inside of your mouth or nose? Unknown Did you need  to seek medical attention at a hospital or doctor's office? yes When did it last happen?      Childhood If all above answers are "NO", may proceed with cephalosporin use.    Consultations: General surgery   Procedures/Studies: US Abdomen Limited RUQ (LIVER/GB)  Result Date: 12/12/2021 CLINICAL DATA:  Right upper quadrant pain, nausea EXAM: ULTRASOUND ABDOMEN LIMITED RIGHT UPPER QUADRANT COMPARISON:  None Available. FINDINGS: Gallbladder: Cholelithiasis with the largest measuring 29.5 mm. Gallbladder sludge. Gallbladder wall thickening measuring 6.5 mm. Positive sonographic Murphy sign. No pericholecystic fluid. Common bile duct: Diameter: 4.9 mm Liver: No focal lesion identified. Increased hepatic parenchymal echogenicity. Portal vein is patent on color Doppler imaging with normal direction of blood flow towards the liver. Other: 3.86 x 4.6 x 3.3 cm right upper pole renal cyst. IMPRESSION: 1. Cholelithiasis with gallbladder wall thickening and positive  sonographic Percell Miller sign concerning for acute cholecystitis. 2. Increased hepatic echogenicity as can be seen with hepatic steatosis. Electronically Signed   By: Kathreen Devoid M.D.   On: 12/12/2021 10:50   MR Brain W Wo Contrast  Result Date: 12/07/2021 CLINICAL DATA:  66 year old male with history of multiple myeloma. Recently diagnosed with right lung/right hilar tumor. Staging. EXAM: MRI HEAD WITHOUT AND WITH CONTRAST TECHNIQUE: Multiplanar, multiecho pulse sequences of the brain and surrounding structures were obtained without and with intravenous contrast. CONTRAST:  71m GADAVIST GADOBUTROL 1 MMOL/ML IV SOLN COMPARISON:  Report of noncontrast brain MRI 09/08/2019 (no images available). PET-CT 11/07/2021. FINDINGS: Brain: No abnormal enhancement identified. No midline shift, mass effect, or evidence of intracranial mass lesion. No dural thickening identified. No restricted diffusion to suggest acute infarction. No ventriculomegaly, extra-axial  collection or acute intracranial hemorrhage. Cervicomedullary junction and pituitary are within normal limits. Widely scattered occasionally Patchy and confluent bilateral cerebral white matter T2 and FLAIR hyperintensity is moderate for age. No cortical encephalomalacia or chronic cerebral blood products identified. Deep gray nuclei, brainstem and cerebellum appear negative. Vascular: Major intracranial vascular flow voids are preserved. There is some generalized intracranial artery tortuosity. Major dural venous sinuses are enhancing and appear to be patent. 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 See also series 21, image 19. This has been described on previous intracranial MRA since 20/10. Skull and upper cervical spine: Visualized bone marrow signal is within normal limits. Negative visible cervical spine and spinal cord. Sinuses/Orbits: Negative. Other: Small nasopharyngeal midline Tornwaldt cyst(s), normal variant. Mastoids are clear. Visible internal auditory structures appear normal. Negative visible scalp and face. IMPRESSION: 1. No metastatic disease or acute intracranial abnormality. 2. Small chronic Anterior Communicating Artery Aneurysm. Consider Neuro-Endovascluar / NIR consultation to evaluate the appropriateness of potential treatment. 3. Moderate for age cerebral white matter signal changes, most commonly due to chronic small vessel disease. Electronically Signed   By: HGenevie AnnM.D.   On: 12/07/2021 13:55   ECHOCARDIOGRAM COMPLETE  Result Date: 12/05/2021    ECHOCARDIOGRAM REPORT   Patient Name:   WCONNELL BOGNARDate of Exam: 12/05/2021 Medical Rec #:  [id number]       Height:       72.0 in Accession #:    [id number]      Weight:       214.9 lb Date of Birth:  404-19-1957       BSA:          2.197 m Patient Age:    67years         BP:           145/94 mmHg Patient Gender: M                 HR:           82 bpm. Exam Location:  [facility]Procedure: 2D Echo, Cardiac Doppler and Color Doppler Indications:    Murmur  History:        Patient has prior history of Echocardiogram examinations, most                 recent 10/25/2008. TIA and COPD, Signs/Symptoms:Murmur; Risk                 Factors:Hypertension, Dyslipidemia and Former Smoker. AAA.  Sonographer:    DWenda LowReferring Phys: [phone]  [place]  Sonographer Comments: Image acquisition challenging due to respiratory motion and Image acquisition challenging due to COPD. IMPRESSIONS  1. Left ventricular ejection fraction, by estimation, is 60 to 65%. The left ventricle has normal function. The left ventricle has no regional wall motion abnormalities. There is moderate left ventricular hypertrophy. Left ventricular diastolic parameters are consistent with Grade I diastolic dysfunction (impaired relaxation).  2. Right ventricular systolic function is normal. The right ventricular size is normal. There is normal pulmonary artery systolic pressure.  3. The mitral valve is normal in structure. No evidence of mitral valve regurgitation. No evidence of mitral stenosis.  4. The tricuspid valve is abnormal.  5. The aortic valve is tricuspid. Aortic valve regurgitation is trivial. No aortic stenosis is present.  6. The inferior vena cava is normal in size with greater than 50% respiratory variability, suggesting right atrial pressure of 3 mmHg. FINDINGS  Left Ventricle: Left ventricular ejection fraction, by estimation, is 60 to 65%. The left ventricle has normal function. The left ventricle has no regional wall motion abnormalities. The left ventricular internal cavity size was normal in size. There is  moderate left ventricular hypertrophy. Left ventricular diastolic parameters are consistent with Grade I diastolic dysfunction (impaired relaxation). Normal left ventricular filling pressure. Right Ventricle: The right ventricular size is normal.  Right vetricular wall thickness was not well visualized. Right ventricular systolic function is normal. There is normal pulmonary artery systolic pressure. The tricuspid regurgitant velocity is 1.97 m/s, and with an assumed right atrial pressure of 3 mmHg, the estimated right ventricular systolic pressure is 65.9 mmHg. Left Atrium: Left atrial size was normal in size. Right Atrium: Right atrial size was normal in size. Pericardium: There is no evidence of pericardial effusion. Mitral Valve: The mitral valve is normal in structure. No evidence of mitral valve regurgitation. No evidence of mitral valve stenosis. MV peak gradient, 5.5 mmHg. The mean mitral valve gradient is 2.0 mmHg. Tricuspid Valve: The tricuspid valve is abnormal. Tricuspid valve regurgitation is mild . No evidence of tricuspid stenosis. Aortic Valve: The aortic valve is tricuspid. Aortic valve regurgitation is trivial. No aortic stenosis is present. Aortic valve mean gradient measures 3.0 mmHg. Aortic valve peak gradient measures 6.4 mmHg. Aortic valve area, by VTI measures 3.66 cm. Pulmonic Valve: The pulmonic valve was not well visualized. Pulmonic valve regurgitation is not visualized. No evidence of pulmonic stenosis. Aorta: The aortic root is normal in size and structure. Venous: The inferior vena cava is normal in size with greater than 50% respiratory variability, suggesting right atrial pressure of 3 mmHg. IAS/Shunts: No atrial level shunt detected by color flow Doppler.  LEFT VENTRICLE PLAX 2D LVIDd:         4.10 cm   Diastology LVIDs:         2.80 cm   LV e' medial:    7.72 cm/s LV PW:         1.40 cm   LV E/e' medial:  8.4 LV IVS:        1.40 cm   LV e' lateral:   12.10 cm/s LVOT diam:     2.30 cm   LV E/e' lateral: 5.3 LV SV:         96 LV SV Index:   43 LVOT Area:     4.15 cm  RIGHT VENTRICLE RV Basal diam:  3.70 cm RV Mid diam:    2.90 cm RV S prime:     11.60 cm/s TAPSE (M-mode): 2.3 cm  LEFT ATRIUM             Index        RIGHT  ATRIUM           Index LA diam:        3.20 cm 1.46 cm/m   RA Area:     16.00 cm LA Vol (A2C):   36.8 ml 16.75 ml/m  RA Volume:   38.30 ml  17.43 ml/m LA Vol (A4C):   35.1 ml 15.98 ml/m LA Biplane Vol: 37.4 ml 17.02 ml/m  AORTIC VALVE                    PULMONIC VALVE AV Area (Vmax):    3.50 cm     PV Vmax:       1.04 m/s AV Area (Vmean):   3.52 cm     PV Peak grad:  4.3 mmHg AV Area (VTI):     3.66 cm AV Vmax:           126.00 cm/s AV Vmean:          84.900 cm/s AV VTI:            0.261 m AV Peak Grad:      6.4 mmHg AV Mean Grad:      3.0 mmHg LVOT Vmax:         106.00 cm/s LVOT Vmean:        71.900 cm/s LVOT VTI:          0.230 m LVOT/AV VTI ratio: 0.88  AORTA Ao Root diam: 3.70 cm Ao Asc diam:  3.60 cm MITRAL VALVE                TRICUSPID VALVE MV Area (PHT): 5.16 cm     TR Peak grad:   15.5 mmHg MV Area VTI:   3.69 cm     TR Vmax:        197.00 cm/s MV Peak grad:  5.5 mmHg MV Mean grad:  2.0 mmHg     SHUNTS MV Vmax:       1.17 m/s     Systemic VTI:  0.23 m MV Vmean:      68.2 cm/s    Systemic Diam: 2.30 cm MV Decel Time: 147 msec MV E velocity: 64.70 cm/s MV A velocity: 102.00 cm/s MV E/A ratio:  0.63 Carlyle Dolly MD Electronically signed by Carlyle Dolly MD Signature Date/Time: 12/05/2021/1:01:04 PM    Final    DG Chest Port 1 View  Result Date: 11/29/2021 CLINICAL DATA:  Status post bronchoscopy EXAM: PORTABLE CHEST 1 VIEW COMPARISON:  Chest x-ray dated September 10, 2020 FINDINGS: Normal heart size. Unchanged right hilar opacity. Lungs are clear. No evidence of pleural effusion or pneumothorax. Old right-sided rib fractures. IMPRESSION: 1. Unchanged right hilar opacity. 2. No evidence of pneumothorax. Electronically Signed   By: Yetta Glassman M.D.   On: 11/29/2021 09:20   CT Super D Chest Wo Contrast  Result Date: 11/29/2021 CLINICAL DATA:  History of multiple myeloma. Recent PET-CT showed a right hilar mass. EXAM: CT CHEST WITHOUT CONTRAST TECHNIQUE: Multidetector CT imaging of the  chest was performed using thin slice collimation for electromagnetic bronchoscopy planning purposes, without intravenous contrast. RADIATION DOSE REDUCTION: This exam was performed according to the departmental dose-optimization program which includes automated exposure control, adjustment of the mA and/or kV according to patient size and/or use of iterative reconstruction technique. COMPARISON:  PET-CT 11/07/2021 FINDINGS: Cardiovascular: The heart  is within normal limits in size. No pericardial effusion. The ascending thoracic aorta is within normal limits in caliber. No aortic calcifications. There are age advanced three-vessel coronary artery calcifications. Mediastinum/Nodes: Small scattered mediastinal lymph nodes but no mass or overt adenopathy. The esophagus is grossly normal. Lungs/Pleura: As demonstrated on the PET-CT there is a smoothly marginated slightly lobulated low-attenuation lesion in the right lower lobe this is mildly compressing the adjacent bronchi. Maximum transverse dimension is 18 mm and the lesion is approximately 23 mm long. There is an adjacent smoothly marginated rounded lesion in the right hilum which is likely an adjacent lymph node. It is difficult to measure this because it is difficult to separate this from the pulmonary artery on this noncontrast study. Looking back at prior chest x-rays I believe this has been there since 2021 but has slowly enlarged. This could be a slow growing carcinoid tumor. Underlying emphysematous changes. No other pulmonary lesions. No acute pulmonary findings. Upper Abdomen: The gallbladder is abnormal. There appears to be wall thickening and pericholecystic inflammation. Could not exclude acute cholecystitis. Recommend right upper quadrant ultrasound examination for further evaluation. Stable simple upper pole right renal cysts not requiring any further evaluation or follow-up. Musculoskeletal: No significant bony findings. No obvious lytic myelomatous  lesions. IMPRESSION: 1. Right lower lobe and right hilar lesions as detailed above. These appear to have been present since 2021 but slowly enlarging. 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 2. No mediastinal adenopathy. 3. Age advanced three-vessel coronary artery calcifications. 4. Abnormal gallbladder. Recommend right upper quadrant ultrasound examination for further evaluation. 5. Emphysema. 6. No obvious lytic myelomatous lesions. Aortic Atherosclerosis (ICD10-I70.0) and Emphysema (ICD10-J43.9). Electronically Signed   By: Marijo Sanes M.D.   On: 11/29/2021 07:58     Discharge Exam: Vitals:   12/15/21 0501 12/15/21 0848  BP: 135/86 (!) 146/102  Pulse: 65 85  Resp: 20   Temp: (!) 97.4 F (36.3 C)   SpO2: 97%    Vitals:   12/14/21 2111 12/15/21 0430 12/15/21 0501 12/15/21 0848  BP: (!) 118/100  135/86 (!) 146/102  Pulse: 68  65 85  Resp: 20  20   Temp: (!) 97.5 F (36.4 C)  (!) 97.4 F (36.3 C)   TempSrc: Oral  Oral   SpO2: 100%  97%   Weight:  92.8 kg    Height:        General: Pt is alert, awake, not in acute distress Cardiovascular: RRR, S1/S2 +, no rubs, no gallops Respiratory: CTA bilaterally, no wheezing, no rhonchi Abdominal: Soft, NT, ND, bowel sounds +, incisions clean dry and intact Extremities: no edema, no cyanosis    The results of significant diagnostics from this hospitalization (including imaging, microbiology, ancillary and laboratory) are listed below for reference.     Microbiology: No results found for this or any previous visit (from the past 240 hour(s)).   Labs: BNP (last 3 results) No results for input(s): "BNP" in the last 8760 hours. Basic Metabolic Panel: Recent Labs  Lab 12/12/21 0926 12/12/21 1738 12/13/21 0441 12/14/21 0352 12/15/21 0341  NA 136 131* 137 138 138  K 3.9 3.2* 4.1 3.9 3.8  CL 96 98 106 108 106  CO2 _0 21*  GLUCOSE 109* 95 99 166* 98  BUN _1 CREATININE 1.45* 1.38* 1.36* 1.07 1.09  CALCIUM  9.0 8.6* 8.4* 8.8* 8.7*  MG  --   --  2.3 2.2 2.3  PHOS  --   --  2.3*  --   --    Liver Function Tests: Recent Labs  Lab 12/12/21 0926 12/12/21 1738 12/13/21 0441 12/14/21 0352 12/15/21 0341  AST 7 9* 9* 49* 33  ALT _0 35 33  ALKPHOS 114 91 87 93 89  BILITOT 1.2 1.2 1.2 0.7 0.5  PROT 6.4 7.2 6.7 7.1 7.2  ALBUMIN 4.1 3.5 3.2* 3.3* 3.5   Recent Labs  Lab 12/12/21 0926 12/12/21 1738  LIPASE 18 23  AMYLASE 14*  --    No results for input(s): "AMMONIA" in the last 168 hours. CBC: Recent Labs  Lab 12/12/21 0926 12/12/21 1738 12/13/21 0441 12/14/21 0352 12/15/21 0341  WBC 17.7* 16.9* 11.4* 10.7* 17.9*  NEUTROABS 13.7* 13.0*  --   --   --   HGB 13.3 13.7 12.7* 12.9* 12.6*  HCT 39.4 39.5 37.4* 37.7* 37.5*  MCV 91 89.2 91.0 89.3 92.8  PLT 280 246 228 246 307   Cardiac Enzymes: No results for input(s): "CKTOTAL", "CKMB", "CKMBINDEX", "TROPONINI" in the last 168 hours. BNP: Invalid input(s): "POCBNP" CBG: No results for input(s): "GLUCAP" in the last 168 hours. D-Dimer No results for input(s): "DDIMER" in the last 72 hours. Hgb A1c No results for input(s): "HGBA1C" in the last 72 hours. Lipid Profile No results for input(s): "CHOL", "HDL", "LDLCALC", "TRIG", "CHOLHDL", "LDLDIRECT" in the last 72 hours. Thyroid function studies No results for input(s): "TSH", "T4TOTAL", "T3FREE", "THYROIDAB" in the last 72 hours.  Invalid input(s): "FREET3" Anemia work up No results for input(s): "VITAMINB12", "FOLATE", "FERRITIN", "TIBC", "IRON", "RETICCTPCT" in the last 72 hours. Urinalysis    Component Value Date/Time   COLORURINE YELLOW 12/12/2021 1603   APPEARANCEUR CLEAR 12/12/2021 1603   LABSPEC 1.009 12/12/2021 1603   PHURINE 6.0 12/12/2021 1603   GLUCOSEU NEGATIVE 12/12/2021 1603   HGBUR MODERATE (A) 12/12/2021 1603   BILIRUBINUR NEGATIVE 12/12/2021 1603   KETONESUR NEGATIVE 12/12/2021 1603   PROTEINUR 30 (A) 12/12/2021 1603   NITRITE NEGATIVE 12/12/2021 1603    LEUKOCYTESUR NEGATIVE 12/12/2021 1603   Sepsis Labs Recent Labs  Lab 12/12/21 1738 12/13/21 0441 12/14/21 0352 12/15/21 0341  WBC 16.9* 11.4* 10.7* 17.9*   Microbiology No results found for this or any previous visit (from the past 240 hour(s)).   Time coordinating discharge: 35 minutes  SIGNED:   Rodena Goldmann, DO [facility] 12/15/2021, 9:55 AM  If 7PM-7AM, please contact night-coverage [email or web address]

## 2021-12-17 ENCOUNTER — Encounter (HOSPITAL_COMMUNITY): Payer: Self-pay

## 2021-12-17 LAB — SURGICAL PATHOLOGY

## 2021-12-18 ENCOUNTER — Inpatient Hospital Stay: Payer: Medicare Other | Attending: Physician Assistant | Admitting: Hematology

## 2021-12-18 ENCOUNTER — Encounter: Payer: Self-pay | Admitting: Radiation Oncology

## 2021-12-18 ENCOUNTER — Encounter: Payer: Self-pay | Admitting: Hematology

## 2021-12-18 ENCOUNTER — Telehealth: Payer: Self-pay | Admitting: Radiation Oncology

## 2021-12-18 VITALS — BP 106/77 | HR 117 | Temp 97.7°F | Resp 18 | Ht 72.0 in | Wt 197.9 lb

## 2021-12-18 DIAGNOSIS — Z79899 Other long term (current) drug therapy: Secondary | ICD-10-CM | POA: Diagnosis not present

## 2021-12-18 DIAGNOSIS — E785 Hyperlipidemia, unspecified: Secondary | ICD-10-CM | POA: Insufficient documentation

## 2021-12-18 DIAGNOSIS — C3431 Malignant neoplasm of lower lobe, right bronchus or lung: Secondary | ICD-10-CM | POA: Insufficient documentation

## 2021-12-18 DIAGNOSIS — Z880 Allergy status to penicillin: Secondary | ICD-10-CM | POA: Diagnosis not present

## 2021-12-18 DIAGNOSIS — I1310 Hypertensive heart and chronic kidney disease without heart failure, with stage 1 through stage 4 chronic kidney disease, or unspecified chronic kidney disease: Secondary | ICD-10-CM | POA: Diagnosis not present

## 2021-12-18 DIAGNOSIS — Z8261 Family history of arthritis: Secondary | ICD-10-CM | POA: Diagnosis not present

## 2021-12-18 DIAGNOSIS — N281 Cyst of kidney, acquired: Secondary | ICD-10-CM | POA: Insufficient documentation

## 2021-12-18 DIAGNOSIS — Z8249 Family history of ischemic heart disease and other diseases of the circulatory system: Secondary | ICD-10-CM | POA: Diagnosis not present

## 2021-12-18 DIAGNOSIS — I5032 Chronic diastolic (congestive) heart failure: Secondary | ICD-10-CM | POA: Diagnosis not present

## 2021-12-18 DIAGNOSIS — G600 Hereditary motor and sensory neuropathy: Secondary | ICD-10-CM | POA: Insufficient documentation

## 2021-12-18 DIAGNOSIS — Z87442 Personal history of urinary calculi: Secondary | ICD-10-CM | POA: Diagnosis not present

## 2021-12-18 DIAGNOSIS — I082 Rheumatic disorders of both aortic and tricuspid valves: Secondary | ICD-10-CM | POA: Diagnosis not present

## 2021-12-18 DIAGNOSIS — Z820 Family history of epilepsy and other diseases of the nervous system: Secondary | ICD-10-CM | POA: Diagnosis not present

## 2021-12-18 DIAGNOSIS — I671 Cerebral aneurysm, nonruptured: Secondary | ICD-10-CM | POA: Diagnosis not present

## 2021-12-18 DIAGNOSIS — Z8673 Personal history of transient ischemic attack (TIA), and cerebral infarction without residual deficits: Secondary | ICD-10-CM | POA: Diagnosis not present

## 2021-12-18 DIAGNOSIS — D469 Myelodysplastic syndrome, unspecified: Secondary | ICD-10-CM | POA: Insufficient documentation

## 2021-12-18 DIAGNOSIS — N1831 Chronic kidney disease, stage 3a: Secondary | ICD-10-CM | POA: Insufficient documentation

## 2021-12-18 DIAGNOSIS — I714 Abdominal aortic aneurysm, without rupture, unspecified: Secondary | ICD-10-CM | POA: Insufficient documentation

## 2021-12-18 DIAGNOSIS — K8020 Calculus of gallbladder without cholecystitis without obstruction: Secondary | ICD-10-CM | POA: Insufficient documentation

## 2021-12-18 DIAGNOSIS — Z888 Allergy status to other drugs, medicaments and biological substances status: Secondary | ICD-10-CM | POA: Diagnosis not present

## 2021-12-18 DIAGNOSIS — J439 Emphysema, unspecified: Secondary | ICD-10-CM | POA: Insufficient documentation

## 2021-12-18 DIAGNOSIS — Z85828 Personal history of other malignant neoplasm of skin: Secondary | ICD-10-CM | POA: Diagnosis not present

## 2021-12-18 DIAGNOSIS — C3491 Malignant neoplasm of unspecified part of right bronchus or lung: Secondary | ICD-10-CM

## 2021-12-18 DIAGNOSIS — Z825 Family history of asthma and other chronic lower respiratory diseases: Secondary | ICD-10-CM | POA: Insufficient documentation

## 2021-12-18 NOTE — Telephone Encounter (Signed)
Patient stated Dr. Delton Coombes has decided he doesn't need radiation. Closing referral until further notice.

## 2021-12-18 NOTE — Progress Notes (Signed)
[facility] NOTE  Patient Care Team: Rakes, Connye Burkitt, FNP as PCP - General (Family Medicine) Derek Jack, MD as Medical Oncologist (Medical Oncology) Brien Mates, RN as Oncology Nurse Navigator (Medical Oncology)  CHIEF COMPLAINTS/PURPOSE OF CONSULTATION:  Right lung squamous cell carcinoma.  HISTORY OF PRESENTING ILLNESS:  Juan Yang 66 y.o. male is seen in consultation today at the request of Darla Lesches, FNP for newly diagnosed right lung squamous cell carcinoma.  This patient was evaluated for monoclonal gammopathy in our clinic.  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  Further work-up with PET scan showed incidental right lung mass with right hilar lymphadenopathy.  He was seen by Dr. Valeta Harms who did bronchoscopy and EBUS biopsy of the 11 R lymph node which showed squamous cell carcinoma.  On 12/13/2021, he underwent laparoscopic cholecystectomy and is recovering from it.  He reportedly lost 30 pounds in the last 1 year and was partly trying to lose weight.  He denies any history of MI but had TIA x3.  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   History of kidney stones    Hypertension    Peripheral neuropathy 10/29/2016   Pneumonia    Transient cerebral ischemic attack, unspecified 05/10/2020   stroke    SURGICAL HISTORY: Past Surgical History:  Procedure Laterality Date   BRONCHIAL NEEDLE ASPIRATION BIOPSY  11/29/2021   Procedure: BRONCHIAL NEEDLE  ASPIRATION BIOPSIES;  Surgeon: Garner Nash, DO;  Location: [facility];  Service: Pulmonary;;   COLONOSCOPY WITH PROPOFOL N/A 11/14/2016   Procedure: COLONOSCOPY WITH PROPOFOL;  Surgeon: Rogene Houston, MD;  [facility];  Service: Endoscopy;  Laterality: N/A;  9:15   LUMBAR LAMINECTOMY/DECOMPRESSION MICRODISCECTOMY Right 05/24/2019   Procedure: LAMINECTOMY AND FORAMINOTOMY LUMBAR FIVE- SACRAL ONE RIGHT;  Surgeon: Vallarie Mare, MD;  Location: [facility];  Service: Neurosurgery;  Laterality: Right;  posterior/right   LUNG BIOPSY     MOHS SURGERY Right 05/2020   ear   MOHS SURGERY     POLYPECTOMY  11/14/2016   Procedure: POLYPECTOMY- SIGMOID COLON X3 TRANSVERSE COLON X2;  Surgeon: Rogene Houston, MD;  [facility];  Service: Endoscopy;;   rt ankle surgery     Titanium hinge   TONSILLECTOMY     TRANSFORAMINAL LUMBAR INTERBODY FUSION (TLIF) WITH PEDICLE SCREW FIXATION 1 LEVEL N/A 05/02/2020   Procedure: Lumbar five Sacral one Transforaminal lumbar interbody fusion;  Surgeon: Vallarie Mare, MD;  Location: [facility];  Service: Neurosurgery;  Laterality: N/A;   VIDEO BRONCHOSCOPY WITH ENDOBRONCHIAL ULTRASOUND N/A 11/29/2021   Procedure: VIDEO BRONCHOSCOPY WITH ENDOBRONCHIAL ULTRASOUND;  Surgeon: Garner Nash, DO;  Location: [facility];  Service: Pulmonary;  Laterality: N/A;    SOCIAL HISTORY: Social History   Socioeconomic History   Marital status: Married    Spouse name: Not on file  Number of children: Not on file   Years of education: Not on file   Highest education level: Not on file  Occupational History   Not on file  Tobacco Use   Smoking status: Former    Packs/day: 1.50    Years: 20.00    Total pack years: 30.00    Types: Cigarettes    Quit date: 11/12/2012    Years since quitting: 9.1    Passive exposure: Never   Smokeless tobacco: Never   Tobacco comments:    Pt smokes 1 pack a month and vapes CBD oil. ARJ 11/22/21  Vaping Use    Vaping Use: Some days   Substances: CBD  Substance and Sexual Activity   Alcohol use: No   Drug use: No   Sexual activity: Yes    Birth control/protection: None  Other Topics Concern   Not on file  Social History Narrative   Right handed   Lives with wife in one story home   Social Determinants of Health   Financial Resource Strain: Not on file  Food Insecurity: No Food Insecurity (12/18/2021)   Hunger Vital Sign    Worried About Running Out of Food in the Last Year: Never true    Ran Out of Food in the Last Year: Never true  Transportation Needs: No Transportation Needs (12/18/2021)   PRAPARE - Hydrologist (Medical): No    Lack of Transportation (Non-Medical): No  Physical Activity: Inactive (05/30/2020)   Exercise Vital Sign    Days of Exercise per Week: 0 days    Minutes of Exercise per Session: 0 min  Stress: Not on file  Social Connections: Not on file  Intimate Partner Violence: Not At Risk (12/18/2021)   Humiliation, Afraid, Rape, and Kick questionnaire    Fear of Current or Ex-Partner: No    Emotionally Abused: No    Physically Abused: No    Sexually Abused: No    FAMILY HISTORY: Family History  Problem Relation Age of Onset   Charcot-Marie-Tooth disease Mother    Heart disease Father    COPD Father    Arthritis Sister     ALLERGIES:  is allergic to other, atenolol, cymbalta [duloxetine hcl], latex, and penicillins.  MEDICATIONS:  Current Outpatient Medications  Medication Sig Dispense Refill   amLODipine (NORVASC) 2.5 MG tablet Take 2.5 mg by mouth 2 (two) times daily.     aspirin EC (ADULT ASPIRIN REGIMEN) 81 MG tablet Take 81 mg by mouth daily.     calcium carbonate (TUMS - DOSED IN MG ELEMENTAL CALCIUM) 500 MG chewable tablet Chew 2 tablets by mouth daily.     clobetasol cream (TEMOVATE) 1.91 % Apply 1 application topically 2 (two) times daily as needed (rash).     cyclobenzaprine (FLEXERIL) 10 MG tablet Take 1 tablet (10 mg  total) by mouth 3 (three) times daily as needed for muscle spasms. 30 tablet 0   Glucosamine HCl-MSM (GLUCOSAMINE-MSM PO) Take 1 tablet by mouth daily.     losartan (COZAAR) 25 MG tablet Take 25 mg by mouth daily.     metoprolol succinate (TOPROL-XL) 25 MG 24 hr tablet Take 1 tablet (25 mg total) by mouth daily. 90 tablet 3   oxyCODONE (OXY IR/ROXICODONE) 5 MG immediate release tablet Take 5 mg by mouth every 4 (four) hours as needed for severe pain.     oxyCODONE (OXY IR/ROXICODONE) 5 MG immediate release tablet Take 1-2 tablets (5-10 mg total) by mouth every  4 (four) hours as needed for breakthrough pain or severe pain. 20 tablet 0   Potassium 99 MG TABS Take 99 mg by mouth daily as needed (low potassium).     sildenafil (VIAGRA) 100 MG tablet Take 100 mg by mouth daily as needed for erectile dysfunction.     sodium bicarbonate 650 MG tablet Take 650 mg by mouth 2 (two) times daily.     ondansetron (ZOFRAN) 4 MG tablet Take 1 tablet (4 mg total) by mouth every 8 (eight) hours as needed for nausea or vomiting. (Patient not taking: Reported on 12/18/2021) 20 tablet 0   No current facility-administered medications for this visit.    REVIEW OF SYSTEMS:   Constitutional: Denies fevers, chills or abnormal night sweats Eyes: Denies blurriness of vision, double vision or watery eyes Ears, nose, mouth, throat, and face: Denies mucositis or sore throat Respiratory: Dyspnea on exertion present. Cardiovascular: Denies palpitation, chest discomfort or lower extremity swelling Gastrointestinal: Positive for nausea, constipation.  Denies diarrhea. Skin: Denies abnormal skin rashes Lymphatics: Denies new lymphadenopathy or easy bruising Neurological: Positive for numbness in the legs and feet. Behavioral/Psych: Mood is stable, no new changes  All other systems were reviewed with the patient and are negative.  PHYSICAL EXAMINATION: ECOG PERFORMANCE STATUS: 1 - Symptomatic but completely  ambulatory  Vitals:   12/18/21 1329  BP: 106/77  Pulse: (!) 117  Resp: 18  Temp: 97.7 F (36.5 C)  SpO2: 94%   Filed Weights   12/18/21 1329  Weight: 197 lb 14.4 oz (89.8 kg)    GENERAL:alert, no distress and comfortable SKIN: skin color, texture, turgor are normal, no rashes or significant lesions EYES: normal, conjunctiva are pink and non-injected, sclera clear OROPHARYNX:no exudate, no erythema and lips, buccal mucosa, and tongue normal  NECK: supple, thyroid normal size, non-tender, without nodularity LYMPH:  no palpable lymphadenopathy in the cervical, axillary or inguinal LUNGS: clear to auscultation and percussion with normal breathing effort HEART: regular rate & rhythm and no murmurs and no lower extremity edema ABDOMEN:abdomen soft, non-tender and normal bowel sounds Musculoskeletal:no cyanosis of digits and no clubbing  PSYCH: alert & oriented x 3 with fluent speech NEURO: no focal motor/sensory deficits  LABORATORY DATA:  I have reviewed the data as listed Lab Results  Component Value Date   WBC 17.9 (H) 12/15/2021   HGB 12.6 (L) 12/15/2021   HCT 37.5 (L) 12/15/2021   MCV 92.8 12/15/2021   PLT 307 12/15/2021     Chemistry      Component Value Date/Time   NA 138 12/15/2021 0341   NA 136 12/12/2021 0926   K 3.8 12/15/2021 0341   CL 106 12/15/2021 0341   CO2 21 (L) 12/15/2021 0341   BUN 13 12/15/2021 0341   BUN 11 12/12/2021 0926   CREATININE 1.09 12/15/2021 0341   CREATININE 1.64 (H) 04/11/2020 1001      Component Value Date/Time   CALCIUM 8.7 (L) 12/15/2021 0341   ALKPHOS 89 12/15/2021 0341   AST 33 12/15/2021 0341   ALT 33 12/15/2021 0341   BILITOT 0.5 12/15/2021 0341   BILITOT 1.2 12/12/2021 0926       RADIOGRAPHIC STUDIES: I have personally reviewed the radiological images as listed and agreed with the findings in the report. US Abdomen Limited RUQ (LIVER/GB)  Result Date: 12/12/2021 CLINICAL DATA:  Right upper quadrant pain, nausea  EXAM: ULTRASOUND ABDOMEN LIMITED RIGHT UPPER QUADRANT COMPARISON:  None Available. FINDINGS: Gallbladder: Cholelithiasis with the largest measuring 29.5 mm.  Gallbladder sludge. Gallbladder wall thickening measuring 6.5 mm. Positive sonographic Murphy sign. No pericholecystic fluid. Common bile duct: Diameter: 4.9 mm Liver: No focal lesion identified. Increased hepatic parenchymal echogenicity. Portal vein is patent on color Doppler imaging with normal direction of blood flow towards the liver. Other: 3.86 x 4.6 x 3.3 cm right upper pole renal cyst. IMPRESSION: 1. Cholelithiasis with gallbladder wall thickening and positive sonographic Murphy sign concerning for acute cholecystitis. 2. Increased hepatic echogenicity as can be seen with hepatic steatosis. Electronically Signed   By: Kathreen Devoid M.D.   On: 12/12/2021 10:50   MR Brain W Wo Contrast  Result Date: 12/07/2021 CLINICAL DATA:  66 year old male with history of multiple myeloma. Recently diagnosed with right lung/right hilar tumor. Staging. EXAM: MRI HEAD WITHOUT AND WITH CONTRAST TECHNIQUE: Multiplanar, multiecho pulse sequences of the brain and surrounding structures were obtained without and with intravenous contrast. CONTRAST:  33m GADAVIST GADOBUTROL 1 MMOL/ML IV SOLN COMPARISON:  Report of noncontrast brain MRI 09/08/2019 (no images available). PET-CT 11/07/2021. FINDINGS: Brain: No abnormal enhancement identified. No midline shift, mass effect, or evidence of intracranial mass lesion. No dural thickening identified. No restricted diffusion to suggest acute infarction. No ventriculomegaly, extra-axial collection or acute intracranial hemorrhage. Cervicomedullary junction and pituitary are within normal limits. Widely scattered occasionally Patchy and confluent bilateral cerebral white matter T2 and FLAIR hyperintensity is moderate for age. No cortical encephalomalacia or chronic cerebral blood products identified. Deep gray nuclei, brainstem and  cerebellum appear negative. Vascular: Major intracranial vascular flow voids are preserved. There is some generalized intracranial artery tortuosity. Major dural venous sinuses are enhancing and appear to be patent. 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 See also series 21, image 19. This has been described on previous intracranial MRA since 20/10. Skull and upper cervical spine: Visualized bone marrow signal is within normal limits. Negative visible cervical spine and spinal cord. Sinuses/Orbits: Negative. Other: Small nasopharyngeal midline Tornwaldt cyst(s), normal variant. Mastoids are clear. Visible internal auditory structures appear normal. Negative visible scalp and face. IMPRESSION: 1. No metastatic disease or acute intracranial abnormality. 2. Small chronic Anterior Communicating Artery Aneurysm. Consider Neuro-Endovascluar / NIR consultation to evaluate the appropriateness of potential treatment. 3. Moderate for age cerebral white matter signal changes, most commonly due to chronic small vessel disease. Electronically Signed   By: HGenevie AnnM.D.   On: 12/07/2021 13:55   ECHOCARDIOGRAM COMPLETE  Result Date: 12/05/2021    ECHOCARDIOGRAM REPORT   Patient Name:   WLORNE WINKELSDate of Exam: 12/05/2021 Medical Rec #:  [id number]       Height:       72.0 in Accession #:    [id number]      Weight:       214.9 lb Date of Birth:  4Mar 09, 1957       BSA:          2.197 m Patient Age:    89years         BP:           145/94 mmHg Patient Gender: M                HR:           82 bpm. [facility] Echo, Cardiac Doppler and Color Doppler Indications:    Murmur  History:  Patient has prior history of Echocardiogram examinations, most                 recent 10/25/2008. TIA and COPD, Signs/Symptoms:Murmur; Risk                 Factors:Hypertension, Dyslipidemia and Former Smoker.  AAA.  Sonographer:    Wenda Low Referring Phys: [id number] LINDA M RAKES  Sonographer Comments: Image acquisition challenging due to respiratory motion and Image acquisition challenging due to COPD. IMPRESSIONS  1. Left ventricular ejection fraction, by estimation, is 60 to 65%. The left ventricle has normal function. The left ventricle has no regional wall motion abnormalities. There is moderate left ventricular hypertrophy. Left ventricular diastolic parameters are consistent with Grade I diastolic dysfunction (impaired relaxation).  2. Right ventricular systolic function is normal. The right ventricular size is normal. There is normal pulmonary artery systolic pressure.  3. The mitral valve is normal in structure. No evidence of mitral valve regurgitation. No evidence of mitral stenosis.  4. The tricuspid valve is abnormal.  5. The aortic valve is tricuspid. Aortic valve regurgitation is trivial. No aortic stenosis is present.  6. The inferior vena cava is normal in size with greater than 50% respiratory variability, suggesting right atrial pressure of 3 mmHg. FINDINGS  Left Ventricle: Left ventricular ejection fraction, by estimation, is 60 to 65%. The left ventricle has normal function. The left ventricle has no regional wall motion abnormalities. The left ventricular internal cavity size was normal in size. There is  moderate left ventricular hypertrophy. Left ventricular diastolic parameters are consistent with Grade I diastolic dysfunction (impaired relaxation). Normal left ventricular filling pressure. Right Ventricle: The right ventricular size is normal. Right vetricular wall thickness was not well visualized. Right ventricular systolic function is normal. There is normal pulmonary artery systolic pressure. The tricuspid regurgitant velocity is 1.97 m/s, and with an assumed right atrial pressure of 3 mmHg, the estimated right ventricular systolic pressure is 59.9 mmHg. Left Atrium: Left atrial size  was normal in size. Right Atrium: Right atrial size was normal in size. Pericardium: There is no evidence of pericardial effusion. Mitral Valve: The mitral valve is normal in structure. No evidence of mitral valve regurgitation. No evidence of mitral valve stenosis. MV peak gradient, 5.5 mmHg. The mean mitral valve gradient is 2.0 mmHg. Tricuspid Valve: The tricuspid valve is abnormal. Tricuspid valve regurgitation is mild . No evidence of tricuspid stenosis. Aortic Valve: The aortic valve is tricuspid. Aortic valve regurgitation is trivial. No aortic stenosis is present. Aortic valve mean gradient measures 3.0 mmHg. Aortic valve peak gradient measures 6.4 mmHg. Aortic valve area, by VTI measures 3.66 cm. Pulmonic Valve: The pulmonic valve was not well visualized. Pulmonic valve regurgitation is not visualized. No evidence of pulmonic stenosis. Aorta: The aortic root is normal in size and structure. Venous: The inferior vena cava is normal in size with greater than 50% respiratory variability, suggesting right atrial pressure of 3 mmHg. IAS/Shunts: No atrial level shunt detected by color flow Doppler.  LEFT VENTRICLE PLAX 2D LVIDd:         4.10 cm   Diastology LVIDs:         2.80 cm   LV e' medial:    7.72 cm/s LV PW:         1.40 cm   LV E/e' medial:  8.4 LV IVS:        1.40 cm   LV e' lateral:   12.10 cm/s LVOT diam:  2.30 cm   LV E/e' lateral: 5.3 LV SV:         96 LV SV Index:   43 LVOT Area:     4.15 cm  RIGHT VENTRICLE RV Basal diam:  3.70 cm RV Mid diam:    2.90 cm RV S prime:     11.60 cm/s TAPSE (M-mode): 2.3 cm LEFT ATRIUM             Index        RIGHT ATRIUM           Index LA diam:        3.20 cm 1.46 cm/m   RA Area:     16.00 cm LA Vol (A2C):   36.8 ml 16.75 ml/m  RA Volume:   38.30 ml  17.43 ml/m LA Vol (A4C):   35.1 ml 15.98 ml/m LA Biplane Vol: 37.4 ml 17.02 ml/m  AORTIC VALVE                    PULMONIC VALVE AV Area (Vmax):    3.50 cm     PV Vmax:       1.04 m/s AV Area (Vmean):   3.52  cm     PV Peak grad:  4.3 mmHg AV Area (VTI):     3.66 cm AV Vmax:           126.00 cm/s AV Vmean:          84.900 cm/s AV VTI:            0.261 m AV Peak Grad:      6.4 mmHg AV Mean Grad:      3.0 mmHg LVOT Vmax:         106.00 cm/s LVOT Vmean:        71.900 cm/s LVOT VTI:          0.230 m LVOT/AV VTI ratio: 0.88  AORTA Ao Root diam: 3.70 cm Ao Asc diam:  3.60 cm MITRAL VALVE                TRICUSPID VALVE MV Area (PHT): 5.16 cm     TR Peak grad:   15.5 mmHg MV Area VTI:   3.69 cm     TR Vmax:        197.00 cm/s MV Peak grad:  5.5 mmHg MV Mean grad:  2.0 mmHg     SHUNTS MV Vmax:       1.17 m/s     Systemic VTI:  0.23 m MV Vmean:      68.2 cm/s    Systemic Diam: 2.30 cm MV Decel Time: 147 msec MV E velocity: 64.70 cm/s MV A velocity: 102.00 cm/s MV E/A ratio:  0.63 Carlyle Dolly MD Electronically signed by Carlyle Dolly MD Signature Date/Time: 12/05/2021/1:01:04 PM    Final    DG Chest Port 1 View  Result Date: 11/29/2021 CLINICAL DATA:  Status post bronchoscopy EXAM: PORTABLE CHEST 1 VIEW COMPARISON:  Chest x-ray dated September 10, 2020 FINDINGS: Normal heart size. Unchanged right hilar opacity. Lungs are clear. No evidence of pleural effusion or pneumothorax. Old right-sided rib fractures. IMPRESSION: 1. Unchanged right hilar opacity. 2. No evidence of pneumothorax. Electronically Signed   By: Yetta Glassman M.D.   On: 11/29/2021 09:20   CT Super D Chest Wo Contrast  Result Date: 11/29/2021 CLINICAL DATA:  History of multiple myeloma. Recent PET-CT showed a right hilar mass. EXAM: CT CHEST WITHOUT CONTRAST TECHNIQUE:  Multidetector CT imaging of the chest was performed using thin slice collimation for electromagnetic bronchoscopy planning purposes, without intravenous contrast. RADIATION DOSE REDUCTION: This exam was performed according to the departmental dose-optimization program which includes automated exposure control, adjustment of the mA and/or kV according to patient size and/or use of  iterative reconstruction technique. COMPARISON:  PET-CT 11/07/2021 FINDINGS: Cardiovascular: The heart is within normal limits in size. No pericardial effusion. The ascending thoracic aorta is within normal limits in caliber. No aortic calcifications. There are age advanced three-vessel coronary artery calcifications. Mediastinum/Nodes: Small scattered mediastinal lymph nodes but no mass or overt adenopathy. The esophagus is grossly normal. Lungs/Pleura: As demonstrated on the PET-CT there is a smoothly marginated slightly lobulated low-attenuation lesion in the right lower lobe this is mildly compressing the adjacent bronchi. Maximum transverse dimension is 18 mm and the lesion is approximately 23 mm long. There is an adjacent smoothly marginated rounded lesion in the right hilum which is likely an adjacent lymph node. It is difficult to measure this because it is difficult to separate this from the pulmonary artery on this noncontrast study. Looking back at prior chest x-rays I believe this has been there since 2021 but has slowly enlarged. This could be a slow growing carcinoid tumor. Underlying emphysematous changes. No other pulmonary lesions. No acute pulmonary findings. Upper Abdomen: The gallbladder is abnormal. There appears to be wall thickening and pericholecystic inflammation. Could not exclude acute cholecystitis. Recommend right upper quadrant ultrasound examination for further evaluation. Stable simple upper pole right renal cysts not requiring any further evaluation or follow-up. Musculoskeletal: No significant bony findings. No obvious lytic myelomatous lesions. IMPRESSION: 1. Right lower lobe and right hilar lesions as detailed above. These appear to have been present since 2021 but slowly enlarging. 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 2. No mediastinal adenopathy. 3. Age advanced three-vessel coronary artery calcifications. 4. Abnormal gallbladder. Recommend right upper quadrant ultrasound  examination for further evaluation. 5. Emphysema. 6. No obvious lytic myelomatous lesions. Aortic Atherosclerosis (ICD10-I70.0) and Emphysema (ICD10-J43.9). Electronically Signed   By: Marijo Sanes M.D.   On: 11/29/2021 07:58    ASSESSMENT:  1.  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  Right infrahilar nodules or lymph node 1.6 cm SUV 9.25.  Small ill-defined lucent lesion within the posterior aspect of the right medial femoral condyle, FDG avid.  Lesion from metastasis or multiple myeloma less favored. - MRI brain (12/05/2021): No metastatic disease - CT super D chest (11/29/2021): Lobulated lesion in the right lower lobe measuring 2.3 x 1.8 cm.  Adjacent smoothly marginated rounded lesion in the right hilum, likely adjacent lymph node, difficult to measure. - Bronchoscopy/EBUS (11/29/2021): Normal right and left lung anatomy, no evidence of endobronchial lesion.  Enlarged right hilar adenopathy.  No subcarinal adenopathy. - Pathology (11/29/2021): Lymph node 11 R FNA-squamous cell carcinoma - [facility] tissue (11/30/2021): PD-L1 22 C3 TPS 80% - [facility] liquid biopsy (11/29/2021): BRCA2 E2720, T p53, PTEN, MSI-high not detected.  Negative for EGFR/ALK.  2.  Social/family history: - He lives at home with his wife.  He worked as a Ecologist prior to retirement.  He smoked 2 packs/day of cigarettes for 40 years and quit smoking cigarettes 10 years ago.  He uses high nicotine vapes. - No family history of malignancies.  3.  IgG lambda MGUS: - SPEP on 10/07/2021, M spike 0.3  g. - Free kappa light chains 23.8, ratio 1.15.  Beta-2 microglobulin 2.6.  LDH 126.  Creatinine 1.41 and calcium normal.  Hemoglobin normal. - Skeletal survey (10/25/2021): Cannot exclude new small lucent lesion in the lateral right femoral head measuring 14 mm. - PET scan (11/07/2021): Small  ill-defined lucent lesion within the posterior aspect of the right medial femoral condyle which is FDG avid.  Indeterminate.  Unusual location for lung cancer and lesion of multiple myeloma is considered less favored.  Sclerotic lesion within the distal diaphysis of the right tibia with possible chondroid matrix.  This chronic benign finding was present in the MRI from May 2007.   PLAN:  1.  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 - Recommend surgical evaluation with Dr. Roxan Hockey. - Discussed role of neoadjuvant systemic therapy if he is a surgical candidate.  He has no contraindications to immunotherapy.  PD-L1 TPS was 80% and EGFR/ALK status is negative. 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 - He is not a candidate for cisplatin because of CKD.  We cannot give taxane because of pre-existing neuropathy in the legs. - Options include carboplatin AUC 5 on day 1, gemcitabine 1000 mg/m2 on days 1 and 8 every 21 days with nivolumab for 3 cycles followed by repeat imaging. - We will discuss further after surgical evaluation. - We will order pulmonary function tests. - Recommend port placement.   Orders Placed This Encounter  Procedures   IR IMAGING GUIDED PORT INSERTION    Standing Status:   Future    Standing Expiration Date:   12/19/2022    Order Specific Question:   Reason for Exam (SYMPTOM  OR DIAGNOSIS REQUIRED)    Answer:   chemotherapy administration    Order Specific Question:   Preferred Imaging Location?    Answer:   [facility]    Order Specific Question:   Release to patient    Answer:   Immediate   Pulmonary Function Test    Standing Status:   Future    Standing Expiration Date:   12/19/2022    Scheduling Instructions:     ASAP    Order Specific Question:   Where should this test be performed?    Answer:   [facility]    Order  Specific Question:   Full PFT: includes the following: basic spirometry, spirometry pre & post bronchodilator, diffusion capacity (DLCO), lung volumes    Answer:   Full PFT    Order Specific Question:   Release to patient    Answer:   Immediate    All questions were answered. The patient knows to call the clinic with any problems, questions or concerns.      Derek Jack, MD 12/18/2021 5:44 PM

## 2021-12-18 NOTE — Patient Instructions (Addendum)
Juan Yang  Discharge Instructions  You were seen and examined today by Dr. Delton Coombes. Dr. Delton Coombes is a medical oncologist, meaning that he specializes in the treatment of cancer diagnoses. Dr. Delton Coombes discussed your past medical history, family history of cancers, and the events that led to you being here today.  You were referred back to Dr. Delton Coombes due to a new diagnosis of lung cancer.  The recommendation for lung cancer at the same stage as you is to have a combination of chemotherapy and immunotherapy followed by surgery. Dr. Delton Coombes will refer you for a surgical consultation. Dr. Delton Coombes has recommended Pulmonary Function Test (PFTs) - this tests your current lung capacity. The PFTs will be helpful for the surgeon to decide if you are a surgical candidate.  In the meantime, please have a Port-A-Cath placed.  Follow-up as scheduled.  Thank you for choosing [facility] to provide your oncology and hematology care.   To afford each patient quality time with our provider, please arrive at least 15 minutes before your scheduled appointment time. You may need to reschedule your appointment if you arrive late (10 or more minutes). Arriving late affects you and other patients whose appointments are after yours.  Also, if you miss three or more appointments without notifying the office, you may be dismissed from the clinic at the provider's discretion.    Again, thank you for choosing [facility].  Our hope is that these requests will decrease the amount of time that you wait before being seen by our physicians.   If you have a lab appointment with the [facility] please come in thru the Main Entrance and check in at the main information desk.           _____________________________________________________________  Should you have questions after your visit to [facility], please contact our  office at [phone] and follow the prompts.  Our office hours are 8:00 a.m. to 4:30 p.m. Monday - Thursday and 8:00 a.m. to 2:30 p.m. Friday.  Please note that voicemails left after 4:00 p.m. may not be returned until the following business day.  We are closed weekends and all major holidays.  You do have access to a nurse 24-7, just call the main number to the clinic [phone] and do not press any options, hold on the line and a nurse will answer the phone.    For prescription refill requests, have your pharmacy contact our office and allow 72 hours.    Masks are optional in the cancer centers. If you would like for your care team to wear a mask while they are taking care of you, please let them know. You may have one support person who is at least 66 years old accompany you for your appointments.

## 2021-12-19 ENCOUNTER — Encounter (HOSPITAL_COMMUNITY): Payer: Medicare Other

## 2021-12-19 ENCOUNTER — Ambulatory Visit: Payer: Medicare Other | Admitting: Radiation Oncology

## 2021-12-19 ENCOUNTER — Encounter: Payer: Self-pay | Admitting: *Deleted

## 2021-12-19 ENCOUNTER — Ambulatory Visit: Payer: Medicare Other

## 2021-12-19 ENCOUNTER — Telehealth: Payer: Self-pay | Admitting: *Deleted

## 2021-12-19 ENCOUNTER — Other Ambulatory Visit: Payer: Self-pay | Admitting: Radiology

## 2021-12-19 ENCOUNTER — Encounter (HOSPITAL_COMMUNITY): Payer: Self-pay

## 2021-12-19 NOTE — Telephone Encounter (Signed)
Received VM from spouse stating patient was diagnosed with lung cancer recently and they do not want to have colonoscopy until next summer. They had received recall letter for this.

## 2021-12-19 NOTE — Telephone Encounter (Signed)
Recall added for June 2024, will mail questionnaire again at that time

## 2021-12-19 NOTE — Patient Outreach (Addendum)
Care Coordination [facility] Note Transition Care Management Follow-up Telephone Call Date of discharge and from where: Sunday, 12/15/21 [facility]- gallstones without obstruction; lap chole How have you been since you were released from the hospital? Per spouse/ caregiver Helene Kelp, on [facility] DPR: "Well, he was doing real good after his surgery, until today-- he woke up today with a feeling in his side/ abdomen that he is calling a "stitch;" it is very painful for him-- he even wanted to go to the ER this morning, it was so bad.... by the time we got there, it had eased off some and the parking lot was packed so he changed his mind.  I am thinking of calling the surgeon.... I am seeing a puffy area in the area where the surgery was done-- he said it has been there all along, but I didn't notice it before.  I don't think this has anything to do with anything, but last night we did go out and he ate a cuban steak sandwich--he tolerated it well, and slept fine.  The pain just came about very suddenly this morning.... I will do as you have advised and go ahead an call the surgeon's office" Any questions or concerns? Yes- as above, new pain post recent lap chole  Items Reviewed: Did the pt receive and understand the discharge instructions provided? Yes  Medications obtained and verified? Yes  Other? No  Any new allergies since your discharge? No  Dietary orders reviewed? Yes Do you have support at home? Yes  Spouse/ Caregiver Helene Kelp assisting with ADL's/ iADL's as indicated/ needed; reports patient is essentially independent in self-care  Home Care and Equipment/Supplies: Were home health services ordered? no If so, what is the name of the agency? N/A  Has the agency set up a time to come to the patient's home? not applicable Were any new equipment or medical supplies ordered?  No What is the name of the medical supply agency? N/A Were you able to get the supplies/equipment? not applicable Do you have any  questions related to the use of the equipment or supplies? No N/A  Functional Questionnaire: (I = Independent and D = Dependent) ADLs: I Spouse/ Caregiver Helene Kelp assisting with ADL's/ iADL's as indicated  Bathing/Dressing- I  Meal Prep- I Spouse/ Caregiver Teresa assisting with ADL's/ iADL's as indicated  Eating- I  Maintaining continence- I  Transferring/Ambulation- I  Managing Meds- I Spouse/ Caregiver Helene Kelp assisting with ADL's/ iADL's as indicated  Follow up appointments reviewed:  PCP Hospital f/u appt confirmed? No  Scheduled to see - on - @ [facility] f/u appt confirmed? Yes  Scheduled to see surgical provider on Thursday 12/26/21 @ 4:00 pm Are transportation arrangements needed? No  If their condition worsens, is the pt aware to call PCP or go to the Emergency Dept.? Yes Was the patient provided with contact information for the PCP's office or ED? Yes Was to pt encouraged to call back with questions or concerns? Yes provided my direct phone number should care coordination needs/ concerns arise in the future  SDOH assessments and interventions completed:   Yes  Care Coordination Interventions Activated:  Yes   Care Coordination Interventions:  Triaged clinical concerns that arose this morning prior to [facility] call; advised spouse to promptly contact surgical provider to discuss her concerns and possibly schedule sooner post-op appointment with provider; discussed benefits of following BRAT/mild non-spicy diet and be conservative with activity/ pace himself; reviewed recent oncology provider office visit on 12/18/21 (new  diagnosis of lung CA)     Encounter Outcome:  Pt. Visit Completed    Oneta Rack, RN, BSN, CCRN Alumnus RN CM Care Coordination/ Transition of [place] Management [phone]: direct office

## 2021-12-20 ENCOUNTER — Telehealth: Payer: Self-pay | Admitting: *Deleted

## 2021-12-20 ENCOUNTER — Encounter (HOSPITAL_COMMUNITY): Payer: Self-pay | Admitting: General Surgery

## 2021-12-20 NOTE — Telephone Encounter (Signed)
Surgical Date: 12/13/2021 Procedure: Lap Chole  Received call from patient spouse, Juan Yang [phone] telephone.   Reports that patient has been voicing C/O increased pain in right side of abdomen under ribs x1 day (onset 12/19/2021). States that patient reports it feels like a pulled muscle. Advised to continue pain management as directed with APAP/ IBU and Oxycodone. Advised to get up and move around.   Also reports that patient has no appetite. Advised that this is normal after surgical procedure. Advised to push fluids and use bland diet. Advised that they can advance diet as tolerated. Of note, patient wife did state that patient had severe nausea after going out to eat at a [place]. Again advised to use bland diet as tolerated.   Patient spouse also reports bulge like area under upper abdomen incision. Patient states that he has had slight bulging there for a while, but spouse is concerned that it has worsened. Advised that area is irritated at this time from incision. Advised to monitor area, but if patient is not having any discomfort with bulge, no surgical intervention is required at this time.

## 2021-12-24 ENCOUNTER — Ambulatory Visit (HOSPITAL_COMMUNITY)
Admission: RE | Admit: 2021-12-24 | Discharge: 2021-12-24 | Disposition: A | Discharge status: Home / Self Care | Payer: Medicare Other | Source: Ambulatory Visit | Attending: Hematology | Admitting: Hematology

## 2021-12-24 ENCOUNTER — Encounter (HOSPITAL_COMMUNITY): Payer: Self-pay

## 2021-12-24 ENCOUNTER — Other Ambulatory Visit: Payer: Self-pay

## 2021-12-24 DIAGNOSIS — C3491 Malignant neoplasm of unspecified part of right bronchus or lung: Secondary | ICD-10-CM

## 2021-12-24 DIAGNOSIS — Z87891 Personal history of nicotine dependence: Secondary | ICD-10-CM | POA: Diagnosis not present

## 2021-12-24 DIAGNOSIS — Z452 Encounter for adjustment and management of vascular access device: Secondary | ICD-10-CM | POA: Diagnosis not present

## 2021-12-24 HISTORY — PX: IR IMAGING GUIDED PORT INSERTION: IMG5740

## 2021-12-24 MED ORDER — HEPARIN SOD (PORK) LOCK FLUSH 100 UNIT/ML IV SOLN
INTRAVENOUS | Status: AC
  Administered 2021-12-24: 500 [IU]
  Filled 2021-12-24: qty 5

## 2021-12-24 MED ORDER — FENTANYL CITRATE (PF) 100 MCG/2ML IJ SOLN
INTRAMUSCULAR | Status: AC | PRN
  Administered 2021-12-24 (×2): 50 ug via INTRAVENOUS

## 2021-12-24 MED ORDER — MIDAZOLAM HCL 2 MG/2ML IJ SOLN
INTRAMUSCULAR | Status: AC
  Filled 2021-12-24: qty 2

## 2021-12-24 MED ORDER — SODIUM CHLORIDE 0.9 % IV SOLN: INTRAVENOUS | Status: DC

## 2021-12-24 MED ORDER — FENTANYL CITRATE (PF) 100 MCG/2ML IJ SOLN
INTRAMUSCULAR | Status: AC
  Filled 2021-12-24: qty 2

## 2021-12-24 MED ORDER — LIDOCAINE-EPINEPHRINE 1 %-1:100000 IJ SOLN
INTRAMUSCULAR | Status: AC
  Administered 2021-12-24: 20 mL
  Filled 2021-12-24: qty 1

## 2021-12-24 MED ORDER — MIDAZOLAM HCL 2 MG/2ML IJ SOLN
INTRAMUSCULAR | Status: AC | PRN
  Administered 2021-12-24 (×2): 1 mg via INTRAVENOUS

## 2021-12-24 NOTE — Procedures (Signed)
Vascular and Interventional Radiology Procedure Note  Patient: Juan Yang DOB: 26-Apr-1955 Medical Record Number: [id number] Note Date/Time: 12/24/21 11:45 AM   Performing Physician: Michaelle Birks, MD Assistant(s): None  Diagnosis: Lung cancer  Procedure: PORT PLACEMENT  Anesthesia: Conscious Sedation Complications: None Estimated Blood Loss: Minimal  Findings:  Successful left-sided port placement, with the tip of the catheter in the proximal right atrium.  Plan: Catheter ready for use.  See detailed procedure note with images in PACS. The patient tolerated the procedure well without incident or complication and was returned to Recovery in stable condition.    Michaelle Birks, MD [facility] Radiology   Pager. [place]

## 2021-12-24 NOTE — H&P (Signed)
Chief Complaint: Patient was seen in consultation today for [facility] placement at the request of Katragadda,Sreedhar  Referring Physician(s): Katragadda,Sreedhar  Supervising Physician: Michaelle Birks  Patient Status: [facility] - Out-pt  History of Present Illness: Juan Yang is a 66 y.o. 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   Patient was diagnosed with MGUS in August 2023, underwent PET scan on 11/09/21 which showed tracer avid right hilar lymph node or mass is identified concerning for malignancy, subsequent CT chest on 11/29/21 showed:   1. Right lower lobe and right hilar lesions as detailed above. These appear to have been present since 2021 but slowly enlarging. 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 2. No mediastinal adenopathy. 3. Age advanced three-vessel coronary artery calcifications. 4. Abnormal gallbladder. Recommend right upper quadrant ultrasound examination for further evaluation. 5. Emphysema. 6. No obvious lytic myelomatous lesions.  He underwent bronch with biopsy on 10/23, pathology revealed squamous cell carcinoma. Patient was referred to medical oncology who recommended surgical eval as well as placement of PAC and initiating systemic therapy. After thorough discussion and shared decision making, patient decided to proceed with Pac placement.   It does not appear that patient has seen by surgery. Confirmed with Dr. Delton Coombes, [place] to proceed with [facility] placement today.   Patient laying in bed, not in acute distress.  Denise headache, fever, chills, shortness of breath, cough, chest pain, abdominal pain, nausea ,vomiting, and bleeding.   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   History of kidney stones    Hypertension    Peripheral neuropathy 10/29/2016   Pneumonia    Transient cerebral ischemic attack, unspecified 05/10/2020   stroke    Past Surgical History:  Procedure Laterality Date   BRONCHIAL NEEDLE ASPIRATION BIOPSY  11/29/2021   Procedure: BRONCHIAL NEEDLE ASPIRATION BIOPSIES;  Surgeon: Garner Nash, DO;  Location: [facility];  Service: Pulmonary;;   CHOLECYSTECTOMY N/A 12/13/2021   Procedure: LAPAROSCOPIC CHOLECYSTECTOMY;  Surgeon: Virl Cagey, MD;  Location: [facility];  Service: General;  Laterality: N/A;   COLONOSCOPY WITH PROPOFOL N/A 11/14/2016   Procedure: COLONOSCOPY WITH PROPOFOL;  Surgeon: Rogene Houston, MD;  [facility];  Service: Endoscopy;  Laterality: N/A;  9:15   LUMBAR LAMINECTOMY/DECOMPRESSION MICRODISCECTOMY Right 05/24/2019   Procedure: LAMINECTOMY AND FORAMINOTOMY LUMBAR FIVE- SACRAL ONE RIGHT;  Surgeon: Vallarie Mare, MD;  Location: [place];  Service: Neurosurgery;  Laterality: Right;  posterior/right   LUNG BIOPSY     MOHS SURGERY Right 05/2020   ear   MOHS SURGERY     POLYPECTOMY  11/14/2016   Procedure: POLYPECTOMY- SIGMOID COLON X3 TRANSVERSE COLON X2;  Surgeon: Rogene Houston, MD;  [facility];  Service: Endoscopy;;   rt ankle surgery     Titanium hinge   TONSILLECTOMY  TRANSFORAMINAL LUMBAR INTERBODY FUSION (TLIF) WITH PEDICLE SCREW FIXATION 1 LEVEL N/A 05/02/2020   Procedure: Lumbar five Sacral one Transforaminal lumbar interbody fusion;  Surgeon: Vallarie Mare, MD;  Location: [place];  Service: Neurosurgery;  Laterality: N/A;   VIDEO BRONCHOSCOPY WITH ENDOBRONCHIAL ULTRASOUND N/A 11/29/2021   Procedure: VIDEO  BRONCHOSCOPY WITH ENDOBRONCHIAL ULTRASOUND;  Surgeon: Garner Nash, DO;  Location: [place];  Service: Pulmonary;  Laterality: N/A;    Allergies: Other, Atenolol, Cymbalta [duloxetine hcl], Latex, and Penicillins  Medications: Prior to Admission medications   Medication Sig Start Date End Date Taking? Authorizing Provider  amLODipine (NORVASC) 2.5 MG tablet Take 2.5 mg by mouth 2 (two) times daily. 04/18/19   [provider]  aspirin EC (ADULT ASPIRIN REGIMEN) 81 MG tablet Take 81 mg by mouth daily. 09/06/21   [provider]  calcium carbonate (TUMS - DOSED IN MG ELEMENTAL CALCIUM) 500 MG chewable tablet Chew 2 tablets by mouth daily.    [provider]  clobetasol cream (TEMOVATE) 9.03 % Apply 1 application topically 2 (two) times daily as needed (rash).    [provider]  cyclobenzaprine (FLEXERIL) 10 MG tablet Take 1 tablet (10 mg total) by mouth 3 (three) times daily as needed for muscle spasms. 05/03/20   Vallarie Mare, MD  Glucosamine HCl-MSM (GLUCOSAMINE-MSM PO) Take 1 tablet by mouth daily.    [provider]  losartan (COZAAR) 25 MG tablet Take 25 mg by mouth daily. 07/23/20   [provider]  metoprolol succinate (TOPROL-XL) 25 MG 24 hr tablet Take 1 tablet (25 mg total) by mouth daily. 10/03/21   Baruch Gouty, FNP  ondansetron (ZOFRAN) 4 MG tablet Take 1 tablet (4 mg total) by mouth every 8 (eight) hours as needed for nausea or vomiting. Patient not taking: Reported on 12/18/2021 12/12/21   Baruch Gouty, FNP  oxyCODONE (OXY IR/ROXICODONE) 5 MG immediate release tablet Take 5 mg by mouth every 4 (four) hours as needed for severe pain.    [provider]  oxyCODONE (OXY IR/ROXICODONE) 5 MG immediate release tablet Take 1-2 tablets (5-10 mg total) by mouth every 4 (four) hours as needed for breakthrough pain or severe pain. 12/15/21   Manuella Ghazi, Pratik D, DO  Potassium 99 MG TABS Take 99 mg by mouth daily as needed (low  potassium).    [provider]  sildenafil (VIAGRA) 100 MG tablet Take 100 mg by mouth daily as needed for erectile dysfunction.    [provider]  sodium bicarbonate 650 MG tablet Take 650 mg by mouth 2 (two) times daily. 11/09/20   [provider]     Family History  Problem Relation Age of Onset   Charcot-Marie-Tooth disease Mother    Heart disease Father    COPD Father    Arthritis Sister     Social History   Socioeconomic History   Marital status: Married    Spouse name: Not on file   Number of children: Not on file   Years of education: Not on file   Highest education level: Not on file  Occupational History   Not on file  Tobacco Use   Smoking status: Former    Packs/day: 1.50    Years: 20.00    Total pack years: 30.00    Types: Cigarettes    Quit date: 11/12/2012    Years since quitting: 9.1    Passive exposure: Never   Smokeless tobacco: Never   Tobacco comments:  Pt smokes 1 pack a month and vapes CBD oil. ARJ 11/22/21  Vaping Use   Vaping Use: Some days   Substances: CBD  Substance and Sexual Activity   Alcohol use: No   Drug use: No   Sexual activity: Yes    Birth control/protection: None  Other Topics Concern   Not on file  Social History Narrative   Right handed   Lives with wife in one story home   Social Determinants of Health   Financial Resource Strain: Not on file  Food Insecurity: No Food Insecurity (12/19/2021)   Hunger Vital Sign    Worried About Running Out of Food in the Last Year: Never true    Ran Out of Food in the Last Year: Never true  Transportation Needs: No Transportation Needs (12/19/2021)   PRAPARE - Hydrologist (Medical): No    Lack of Transportation (Non-Medical): No  Physical Activity: Inactive (05/30/2020)   Exercise Vital Sign    Days of Exercise per Week: 0 days    Minutes of Exercise per Session: 0 min  Stress: Not on file  Social Connections: Not on file      Review of Systems: A 12 point ROS discussed and pertinent positives are indicated in the HPI above.  All other systems are negative.  Vital Signs: BP (!) 135/96   Pulse 98   Temp 98.2 F (36.8 C) (Oral)   Resp 18   Ht 6' (1.829 m)   Wt 197 lb (89.4 kg)   SpO2 99%   BMI 26.72 kg/m   Physical Exam Vitals and nursing note reviewed.  Constitutional:      General: Patient is not in acute distress.    Appearance: Normal appearance. Patient is not ill-appearing.  HENT:     Head: Normocephalic and atraumatic.     Mouth/Throat:     Mouth: Mucous membranes are moist.     Pharynx: Oropharynx is clear.  Cardiovascular:     Rate and Rhythm: Normal rate and regular rhythm.     Pulses: Normal pulses.     Heart sounds: Normal heart sounds.  Pulmonary:     Effort: Pulmonary effort is normal.     Breath sounds: Normal breath sounds.  Abdominal:     General: Abdomen is flat. Bowel sounds are normal.     Palpations: Abdomen is soft.  Musculoskeletal:     Cervical back: Neck supple.  Skin:    General: Skin is warm and dry.     Coloration: Skin is not jaundiced or pale.  Neurological:     Mental Status: Patient is alert and oriented to person, place, and time.  Psychiatric:        Mood and Affect: Mood normal.        Behavior: Behavior normal.        Judgment: Judgment normal.     MD Evaluation Airway: WNL Heart: WNL Abdomen: WNL Chest/ Lungs: WNL ASA  Classification: 3 Mallampati/Airway Score: Two  Imaging: US Abdomen Limited RUQ (LIVER/GB)  Result Date: 12/12/2021 CLINICAL DATA:  Right upper quadrant pain, nausea EXAM: ULTRASOUND ABDOMEN LIMITED RIGHT UPPER QUADRANT COMPARISON:  None Available. FINDINGS: Gallbladder: Cholelithiasis with the largest measuring 29.5 mm. Gallbladder sludge. Gallbladder wall thickening measuring 6.5 mm. Positive sonographic Murphy sign. No pericholecystic fluid. Common bile duct: Diameter: 4.9 mm Liver: No focal lesion identified. Increased  hepatic parenchymal echogenicity. Portal vein is patent on color Doppler imaging with normal direction of blood flow towards  the liver. Other: 3.86 x 4.6 x 3.3 cm right upper pole renal cyst. IMPRESSION: 1. Cholelithiasis with gallbladder wall thickening and positive sonographic Murphy sign concerning for acute cholecystitis. 2. Increased hepatic echogenicity as can be seen with hepatic steatosis. Electronically Signed   By: Kathreen Devoid M.D.   On: 12/12/2021 10:50   MR Brain W Wo Contrast  Result Date: 12/07/2021 CLINICAL DATA:  66 year old male with history of multiple myeloma. Recently diagnosed with right lung/right hilar tumor. Staging. EXAM: MRI HEAD WITHOUT AND WITH CONTRAST TECHNIQUE: Multiplanar, multiecho pulse sequences of the brain and surrounding structures were obtained without and with intravenous contrast. CONTRAST:  33m GADAVIST GADOBUTROL 1 MMOL/ML IV SOLN COMPARISON:  Report of noncontrast brain MRI 09/08/2019 (no images available). PET-CT 11/07/2021. FINDINGS: Brain: No abnormal enhancement identified. No midline shift, mass effect, or evidence of intracranial mass lesion. No dural thickening identified. No restricted diffusion to suggest acute infarction. No ventriculomegaly, extra-axial collection or acute intracranial hemorrhage. Cervicomedullary junction and pituitary are within normal limits. Widely scattered occasionally Patchy and confluent bilateral cerebral white matter T2 and FLAIR hyperintensity is moderate for age. No cortical encephalomalacia or chronic cerebral blood products identified. Deep gray nuclei, brainstem and cerebellum appear negative. Vascular: Major intracranial vascular flow voids are preserved. There is some generalized intracranial artery tortuosity. Major dural venous sinuses are enhancing and appear to be patent. 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 See  also series 21, image 19. This has been described on previous intracranial MRA since 20/10. Skull and upper cervical spine: Visualized bone marrow signal is within normal limits. Negative visible cervical spine and spinal cord. Sinuses/Orbits: Negative. Other: Small nasopharyngeal midline Tornwaldt cyst(s), normal variant. Mastoids are clear. Visible internal auditory structures appear normal. Negative visible scalp and face. IMPRESSION: 1. No metastatic disease or acute intracranial abnormality. 2. Small chronic Anterior Communicating Artery Aneurysm. Consider Neuro-Endovascluar / NIR consultation to evaluate the appropriateness of potential treatment. 3. Moderate for age cerebral white matter signal changes, most commonly due to chronic small vessel disease. Electronically Signed   By: HGenevie AnnM.D.   On: 12/07/2021 13:55   ECHOCARDIOGRAM COMPLETE  Result Date: 12/05/2021    ECHOCARDIOGRAM REPORT   Patient Name:   WKENSON GROHDate of Exam: 12/05/2021 Medical Rec #:  [id number]       Height:       72.0 in Accession #:    [id number]      Weight:       214.9 lb Date of Birth:  4May 15, 1957       BSA:          2.197 m Patient Age:    89years         BP:           145/94 mmHg Patient Gender: M                HR:           82 bpm. [facility] Echo, Cardiac Doppler and Color Doppler Indications:    Murmur  History:        Patient has prior history of Echocardiogram examinations, most                 recent 10/25/2008. TIA and COPD, Signs/Symptoms:Murmur; Risk  Factors:Hypertension, Dyslipidemia and Former Smoker. AAA.  Sonographer:    Wenda Low Referring Phys: [id number] LINDA M RAKES  Sonographer Comments: Image acquisition challenging due to respiratory motion and Image acquisition challenging due to COPD. IMPRESSIONS  1. Left ventricular ejection fraction, by estimation, is 60 to 65%. The left ventricle has normal function. The left ventricle has no regional  wall motion abnormalities. There is moderate left ventricular hypertrophy. Left ventricular diastolic parameters are consistent with Grade I diastolic dysfunction (impaired relaxation).  2. Right ventricular systolic function is normal. The right ventricular size is normal. There is normal pulmonary artery systolic pressure.  3. The mitral valve is normal in structure. No evidence of mitral valve regurgitation. No evidence of mitral stenosis.  4. The tricuspid valve is abnormal.  5. The aortic valve is tricuspid. Aortic valve regurgitation is trivial. No aortic stenosis is present.  6. The inferior vena cava is normal in size with greater than 50% respiratory variability, suggesting right atrial pressure of 3 mmHg. FINDINGS  Left Ventricle: Left ventricular ejection fraction, by estimation, is 60 to 65%. The left ventricle has normal function. The left ventricle has no regional wall motion abnormalities. The left ventricular internal cavity size was normal in size. There is  moderate left ventricular hypertrophy. Left ventricular diastolic parameters are consistent with Grade I diastolic dysfunction (impaired relaxation). Normal left ventricular filling pressure. Right Ventricle: The right ventricular size is normal. Right vetricular wall thickness was not well visualized. Right ventricular systolic function is normal. There is normal pulmonary artery systolic pressure. The tricuspid regurgitant velocity is 1.97 m/s, and with an assumed right atrial pressure of 3 mmHg, the estimated right ventricular systolic pressure is 76.8 mmHg. Left Atrium: Left atrial size was normal in size. Right Atrium: Right atrial size was normal in size. Pericardium: There is no evidence of pericardial effusion. Mitral Valve: The mitral valve is normal in structure. No evidence of mitral valve regurgitation. No evidence of mitral valve stenosis. MV peak gradient, 5.5 mmHg. The mean mitral valve gradient is 2.0 mmHg. Tricuspid Valve: The  tricuspid valve is abnormal. Tricuspid valve regurgitation is mild . No evidence of tricuspid stenosis. Aortic Valve: The aortic valve is tricuspid. Aortic valve regurgitation is trivial. No aortic stenosis is present. Aortic valve mean gradient measures 3.0 mmHg. Aortic valve peak gradient measures 6.4 mmHg. Aortic valve area, by VTI measures 3.66 cm. Pulmonic Valve: The pulmonic valve was not well visualized. Pulmonic valve regurgitation is not visualized. No evidence of pulmonic stenosis. Aorta: The aortic root is normal in size and structure. Venous: The inferior vena cava is normal in size with greater than 50% respiratory variability, suggesting right atrial pressure of 3 mmHg. IAS/Shunts: No atrial level shunt detected by color flow Doppler.  LEFT VENTRICLE PLAX 2D LVIDd:         4.10 cm   Diastology LVIDs:         2.80 cm   LV e' medial:    7.72 cm/s LV PW:         1.40 cm   LV E/e' medial:  8.4 LV IVS:        1.40 cm   LV e' lateral:   12.10 cm/s LVOT diam:     2.30 cm   LV E/e' lateral: 5.3 LV SV:         96 LV SV Index:   43 LVOT Area:     4.15 cm  RIGHT VENTRICLE RV Basal diam:  3.70 cm RV Mid  diam:    2.90 cm RV S prime:     11.60 cm/s TAPSE (M-mode): 2.3 cm LEFT ATRIUM             Index        RIGHT ATRIUM           Index LA diam:        3.20 cm 1.46 cm/m   RA Area:     16.00 cm LA Vol (A2C):   36.8 ml 16.75 ml/m  RA Volume:   38.30 ml  17.43 ml/m LA Vol (A4C):   35.1 ml 15.98 ml/m LA Biplane Vol: 37.4 ml 17.02 ml/m  AORTIC VALVE                    PULMONIC VALVE AV Area (Vmax):    3.50 cm     PV Vmax:       1.04 m/s AV Area (Vmean):   3.52 cm     PV Peak grad:  4.3 mmHg AV Area (VTI):     3.66 cm AV Vmax:           126.00 cm/s AV Vmean:          84.900 cm/s AV VTI:            0.261 m AV Peak Grad:      6.4 mmHg AV Mean Grad:      3.0 mmHg LVOT Vmax:         106.00 cm/s LVOT Vmean:        71.900 cm/s LVOT VTI:          0.230 m LVOT/AV VTI ratio: 0.88  AORTA Ao Root diam: 3.70 cm Ao Asc diam:   3.60 cm MITRAL VALVE                TRICUSPID VALVE MV Area (PHT): 5.16 cm     TR Peak grad:   15.5 mmHg MV Area VTI:   3.69 cm     TR Vmax:        197.00 cm/s MV Peak grad:  5.5 mmHg MV Mean grad:  2.0 mmHg     SHUNTS MV Vmax:       1.17 m/s     Systemic VTI:  0.23 m MV Vmean:      68.2 cm/s    Systemic Diam: 2.30 cm MV Decel Time: 147 msec MV E velocity: 64.70 cm/s MV A velocity: 102.00 cm/s MV E/A ratio:  0.63 Carlyle Dolly MD Electronically signed by Carlyle Dolly MD Signature Date/Time: 12/05/2021/1:01:04 PM    Final    DG Chest Port 1 View  Result Date: 11/29/2021 CLINICAL DATA:  Status post bronchoscopy EXAM: PORTABLE CHEST 1 VIEW COMPARISON:  Chest x-ray dated September 10, 2020 FINDINGS: Normal heart size. Unchanged right hilar opacity. Lungs are clear. No evidence of pleural effusion or pneumothorax. Old right-sided rib fractures. IMPRESSION: 1. Unchanged right hilar opacity. 2. No evidence of pneumothorax. Electronically Signed   By: Yetta Glassman M.D.   On: 11/29/2021 09:20   CT Super D Chest Wo Contrast  Result Date: 11/29/2021 CLINICAL DATA:  History of multiple myeloma. Recent PET-CT showed a right hilar mass. EXAM: CT CHEST WITHOUT CONTRAST TECHNIQUE: Multidetector CT imaging of the chest was performed using thin slice collimation for electromagnetic bronchoscopy planning purposes, without intravenous contrast. RADIATION DOSE REDUCTION: This exam was performed according to the departmental dose-optimization program which includes automated exposure control, adjustment of the mA and/or kV  according to patient size and/or use of iterative reconstruction technique. COMPARISON:  PET-CT 11/07/2021 FINDINGS: Cardiovascular: The heart is within normal limits in size. No pericardial effusion. The ascending thoracic aorta is within normal limits in caliber. No aortic calcifications. There are age advanced three-vessel coronary artery calcifications. Mediastinum/Nodes: Small scattered  mediastinal lymph nodes but no mass or overt adenopathy. The esophagus is grossly normal. Lungs/Pleura: As demonstrated on the PET-CT there is a smoothly marginated slightly lobulated low-attenuation lesion in the right lower lobe this is mildly compressing the adjacent bronchi. Maximum transverse dimension is 18 mm and the lesion is approximately 23 mm long. There is an adjacent smoothly marginated rounded lesion in the right hilum which is likely an adjacent lymph node. It is difficult to measure this because it is difficult to separate this from the pulmonary artery on this noncontrast study. Looking back at prior chest x-rays I believe this has been there since 2021 but has slowly enlarged. This could be a slow growing carcinoid tumor. Underlying emphysematous changes. No other pulmonary lesions. No acute pulmonary findings. Upper Abdomen: The gallbladder is abnormal. There appears to be wall thickening and pericholecystic inflammation. Could not exclude acute cholecystitis. Recommend right upper quadrant ultrasound examination for further evaluation. Stable simple upper pole right renal cysts not requiring any further evaluation or follow-up. Musculoskeletal: No significant bony findings. No obvious lytic myelomatous lesions. IMPRESSION: 1. Right lower lobe and right hilar lesions as detailed above. These appear to have been present since 2021 but slowly enlarging. 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 2. No mediastinal adenopathy. 3. Age advanced three-vessel coronary artery calcifications. 4. Abnormal gallbladder. Recommend right upper quadrant ultrasound examination for further evaluation. 5. Emphysema. 6. No obvious lytic myelomatous lesions. Aortic Atherosclerosis (ICD10-I70.0) and Emphysema (ICD10-J43.9). Electronically Signed   By: Marijo Sanes M.D.   On: 11/29/2021 07:58    Labs:  CBC: Recent Labs    12/12/21 1738 12/13/21 0441 12/14/21 0352 12/15/21 0341  WBC 16.9* 11.4* 10.7* 17.9*  HGB  13.7 12.7* 12.9* 12.6*  HCT 39.5 37.4* 37.7* 37.5*  PLT 246 228 246 307    COAGS: No results for input(s): "INR", "APTT" in the last 8760 hours.  BMP: Recent Labs    12/12/21 1738 12/13/21 0441 12/14/21 0352 12/15/21 0341  NA 131* 137 138 138  K 3.2* 4.1 3.9 3.8  CL 98 106 108 106  CO2 _0 21*  GLUCOSE 95 99 166* 98  BUN _1 CALCIUM 8.6* 8.4* 8.8* 8.7*  CREATININE 1.38* 1.36* 1.07 1.09  GFRNONAA 56* 57* >60 >60    LIVER FUNCTION TESTS: Recent Labs    12/12/21 1738 12/13/21 0441 12/14/21 0352 12/15/21 0341  BILITOT 1.2 1.2 0.7 0.5  AST 9* 9* 49* 33  ALT 11 10 35 33  ALKPHOS 91 87 93 89  PROT 7.2 6.7 7.1 7.2  ALBUMIN 3.5 3.2* 3.3* 3.5    TUMOR MARKERS: No results for input(s): "AFPTM", "CEA", "CA199", "CHROMGRNA" in the last 8760 hours.  Assessment and Plan: 66 y.o. male with recent diagnosis of right lung CA who is in need of lone term CVC.  NPO since MN VSS On ASA 81 mg, no need for D/C  Risks and benefits of image guided port-a-catheter placement was discussed with the patient including, but not limited to bleeding, infection, pneumothorax, or fibrin sheath development and need for additional procedures.  All of the patient's questions were answered, patient is agreeable to proceed. Consent signed and in  chart.  Thank you for this interesting consult.  I greatly enjoyed meeting VIKASH NEST and look forward to participating in their care.  A copy of this report was sent to the requesting provider on this date.  Electronically Signed: Tera Mater, PA-C 12/24/2021, 10:35 AM   I spent a total of  30 Minutes   in face to face in clinical consultation, greater than 50% of which was counseling/coordinating care for [facility] placement.  This chart was dictated using voice recognition software.  Despite best efforts to proofread,  errors can occur which can change the documentation meaning.

## 2021-12-26 ENCOUNTER — Ambulatory Visit (INDEPENDENT_AMBULATORY_CARE_PROVIDER_SITE_OTHER): Payer: Medicare Other | Admitting: General Surgery

## 2021-12-26 ENCOUNTER — Ambulatory Visit (HOSPITAL_COMMUNITY)
Admission: RE | Admit: 2021-12-26 | Discharge: 2021-12-26 | Disposition: A | Discharge status: Home / Self Care | Payer: Medicare Other | Source: Ambulatory Visit | Attending: Hematology | Admitting: Hematology

## 2021-12-26 ENCOUNTER — Encounter (HOSPITAL_COMMUNITY): Payer: Self-pay

## 2021-12-26 DIAGNOSIS — K8000 Calculus of gallbladder with acute cholecystitis without obstruction: Secondary | ICD-10-CM

## 2021-12-26 DIAGNOSIS — C3491 Malignant neoplasm of unspecified part of right bronchus or lung: Secondary | ICD-10-CM

## 2021-12-26 MED ORDER — ALBUTEROL SULFATE (2.5 MG/3ML) 0.083% IN NEBU: 2.5000 mg | INHALATION_SOLUTION | Freq: Once | RESPIRATORY_TRACT | Status: DC

## 2021-12-26 NOTE — Progress Notes (Signed)
[facility]  I am calling the patient for post operative evaluation. This is not a billable encounter as it is under the [facility] charges for the surgery.  The patient had a laparoscopic cholecystectomy on 12/13/2021. The patient reports that he was doing well but had gas pains after surgery for several days when he was moving around. This is now better and has not happened since that time. No fevers or chills. Incisions healing without erythema or drainage. He is having a low appetite but improving some.   Pathology: A. GALLBLADDER, CHOLECYSTECTOMY:  Acute and chronic cholecystitis with mucosal erosion/ulceration  Cholelithiasis  Fibrinous serositis  One benign pericholecystic lymph node   Will see the patient PRN.  Will get CBC to make sure things resolving.   Curlene Labrum, MD [facility] [address] [place], [place] [address] [phone] (office)

## 2021-12-27 ENCOUNTER — Telehealth: Payer: Self-pay | Admitting: *Deleted

## 2021-12-27 ENCOUNTER — Ambulatory Visit (INDEPENDENT_AMBULATORY_CARE_PROVIDER_SITE_OTHER): Payer: Medicare Other | Admitting: Family Medicine

## 2021-12-27 ENCOUNTER — Encounter: Payer: Self-pay | Admitting: Family Medicine

## 2021-12-27 VITALS — BP 142/98 | HR 106 | Temp 98.0°F | Ht 73.0 in | Wt 199.6 lb

## 2021-12-27 DIAGNOSIS — L03032 Cellulitis of left toe: Secondary | ICD-10-CM

## 2021-12-27 MED ORDER — SULFAMETHOXAZOLE-TRIMETHOPRIM 800-160 MG PO TABS: 1.0000 | ORAL_TABLET | Freq: Two times a day (BID) | ORAL | 0 refills | Status: AC

## 2021-12-27 NOTE — Telephone Encounter (Signed)
Received call from patient spouse, Helene Kelp [phone]~ telephone.   Spouse reports that patient has infection noted to toe as he has injured it in unknown way.   States that they are going to PCP today for evaluation. States that labs will not be obtained at outpatient draw site as they will be noted to have elevation in WBC due to toe infection.   Dr. Constance Haw to be made aware.

## 2021-12-27 NOTE — Progress Notes (Signed)
Subjective:  Patient ID: Juan Yang, male    DOB: April 11, 1955, 66 y.o.   MRN: [id number]  Patient Care Team: Baruch Gouty, FNP as PCP - General (Family Medicine) Derek Jack, MD as Medical Oncologist (Medical Oncology) Brien Mates, RN as Oncology Nurse Navigator (Medical Oncology)   Chief Complaint:  Toe Pain (Left big toe x 2 days. Swelling, red and hot to touch )   HPI: Juan Yang is a 66 y.o. male presenting on 12/27/2021 for Toe Pain (Left big toe x 2 days. Swelling, red and hot to touch )   Pt presents today for evaluation of wound to left great toe. States the redness has been present for the last few days. States the wound opened up yesterday and worsened significantly over night. He denies pain. No fever, chills, weakness, or confusion.      Relevant past medical, surgical, family, and social history reviewed and updated as indicated.  Allergies and medications reviewed and updated. Data reviewed: Chart in Epic.   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   History of kidney stones    Hypertension    Peripheral neuropathy 10/29/2016   Pneumonia    Transient cerebral ischemic attack, unspecified 05/10/2020   stroke    Past Surgical History:  Procedure Laterality Date   BRONCHIAL NEEDLE ASPIRATION BIOPSY  11/29/2021   Procedure: BRONCHIAL NEEDLE ASPIRATION BIOPSIES;  Surgeon: Garner Nash, DO;  Location: [facility];  Service: Pulmonary;;   CHOLECYSTECTOMY N/A 12/13/2021   Procedure: LAPAROSCOPIC CHOLECYSTECTOMY;  Surgeon:  Virl Cagey, MD;  Location: [facility];  Service: General;  Laterality: N/A;   COLONOSCOPY WITH PROPOFOL N/A 11/14/2016   Procedure: COLONOSCOPY WITH PROPOFOL;  Surgeon: Rogene Houston, MD;  [facility];  Service: Endoscopy;  Laterality: N/A;  9:15   IR IMAGING GUIDED PORT INSERTION  12/24/2021   LUMBAR LAMINECTOMY/DECOMPRESSION MICRODISCECTOMY Right 05/24/2019   Procedure: LAMINECTOMY AND FORAMINOTOMY LUMBAR FIVE- SACRAL ONE RIGHT;  Surgeon: Vallarie Mare, MD;  Location: [facility];  Service: Neurosurgery;  Laterality: Right;  posterior/right   LUNG BIOPSY     MOHS SURGERY Right 05/2020   ear   MOHS SURGERY     POLYPECTOMY  11/14/2016   Procedure: POLYPECTOMY- SIGMOID COLON X3 TRANSVERSE COLON X2;  Surgeon: Rogene Houston, MD;  [facility];  Service: Endoscopy;;   rt ankle surgery     Titanium hinge   TONSILLECTOMY     TRANSFORAMINAL LUMBAR INTERBODY FUSION (TLIF) WITH PEDICLE SCREW FIXATION 1 LEVEL N/A 05/02/2020   Procedure: Lumbar five Sacral one Transforaminal lumbar interbody fusion;  Surgeon: Vallarie Mare, MD;  Location: [facility];  Service: Neurosurgery;  Laterality: N/A;   VIDEO BRONCHOSCOPY WITH ENDOBRONCHIAL ULTRASOUND N/A 11/29/2021   Procedure: VIDEO BRONCHOSCOPY WITH ENDOBRONCHIAL ULTRASOUND;  Surgeon: Garner Nash, DO;  Location: [facility];  Service: Pulmonary;  Laterality: N/A;    Social History  Socioeconomic History   Marital status: Married    Spouse name: Not on file   Number of children: Not on file   Years of education: Not on file   Highest education level: Not on file  Occupational History   Not on file  Tobacco Use   Smoking status: Former    Packs/day: 1.50    Years: 20.00    Total pack years: 30.00    Types: Cigarettes    Quit date: 11/12/2012    Years since quitting: 9.1    Passive exposure: Never   Smokeless tobacco: Never   Tobacco comments:    Pt smokes 1 pack a month and vapes CBD oil. ARJ 11/22/21   Vaping Use   Vaping Use: Some days   Substances: CBD  Substance and Sexual Activity   Alcohol use: No   Drug use: No   Sexual activity: Yes    Birth control/protection: None  Other Topics Concern   Not on file  Social History Narrative   Right handed   Lives with wife in one story home   Social Determinants of Health   Financial Resource Strain: Not on file  Food Insecurity: No Food Insecurity (12/19/2021)   Hunger Vital Sign    Worried About Running Out of Food in the Last Year: Never true    Ran Out of Food in the Last Year: Never true  Transportation Needs: No Transportation Needs (12/19/2021)   PRAPARE - Hydrologist (Medical): No    Lack of Transportation (Non-Medical): No  Physical Activity: Inactive (05/30/2020)   Exercise Vital Sign    Days of Exercise per Week: 0 days    Minutes of Exercise per Session: 0 min  Stress: Not on file  Social Connections: Not on file  Intimate Partner Violence: Not At Risk (12/18/2021)   Humiliation, Afraid, Rape, and Kick questionnaire    Fear of Current or Ex-Partner: No    Emotionally Abused: No    Physically Abused: No    Sexually Abused: No    Outpatient Encounter Medications as of 12/27/2021  Medication Sig   amLODipine (NORVASC) 2.5 MG tablet Take 2.5 mg by mouth 2 (two) times daily.   aspirin EC (ADULT ASPIRIN REGIMEN) 81 MG tablet Take 81 mg by mouth daily.   calcium carbonate (TUMS - DOSED IN MG ELEMENTAL CALCIUM) 500 MG chewable tablet Chew 2 tablets by mouth daily.   clobetasol cream (TEMOVATE) 0.62 % Apply 1 application topically 2 (two) times daily as needed (rash).   cyclobenzaprine (FLEXERIL) 10 MG tablet Take 1 tablet (10 mg total) by mouth 3 (three) times daily as needed for muscle spasms.   Glucosamine HCl-MSM (GLUCOSAMINE-MSM PO) Take 1 tablet by mouth daily.   losartan (COZAAR) 25 MG tablet Take 25 mg by mouth daily.   metoprolol succinate (TOPROL-XL) 25 MG 24 hr tablet Take 1 tablet  (25 mg total) by mouth daily.   ondansetron (ZOFRAN) 4 MG tablet Take 1 tablet (4 mg total) by mouth every 8 (eight) hours as needed for nausea or vomiting.   oxyCODONE (OXY IR/ROXICODONE) 5 MG immediate release tablet Take 5 mg by mouth every 4 (four) hours as needed for severe pain.   oxyCODONE (OXY IR/ROXICODONE) 5 MG immediate release tablet Take 1-2 tablets (5-10 mg total) by mouth every 4 (four) hours as needed for breakthrough pain or severe pain.   Potassium 99 MG TABS Take 99 mg by mouth daily as needed (low potassium).  sildenafil (VIAGRA) 100 MG tablet Take 100 mg by mouth daily as needed for erectile dysfunction.   sodium bicarbonate 650 MG tablet Take 650 mg by mouth 2 (two) times daily.   sulfamethoxazole-trimethoprim (BACTRIM DS) 800-160 MG tablet Take 1 tablet by mouth 2 (two) times daily for 10 days.   No facility-administered encounter medications on file as of 12/27/2021.    Allergies  Allergen Reactions   Other Hives    DUCK EGGS ONLY   Atenolol Diarrhea and Nausea Only    Pt unsure of allergy? Possible fainting?   Cymbalta [Duloxetine Hcl] Other (See Comments)    Pt states it shut his bladder down and could not urinate; states it made him lethargic as well.   Latex Itching   Penicillins Hives    Did it involve swelling of the face/tongue/throat, SOB, or low BP? Unknown Did it involve sudden or severe rash/hives, skin peeling, or any reaction on the inside of your mouth or nose? Unknown Did you need to seek medical attention at a hospital or doctor's office? yes When did it last happen?      Childhood If all above answers are "NO", may proceed with cephalosporin use.    Review of Systems  Constitutional:  Negative for activity change, appetite change, chills, diaphoresis, fatigue, fever and unexpected weight change.  HENT: Negative.    Eyes: Negative.  Negative for photophobia and visual disturbance.  Respiratory:  Negative for cough, chest tightness and  shortness of breath.   Cardiovascular:  Negative for chest pain, palpitations and leg swelling.  Gastrointestinal:  Negative for abdominal pain, blood in stool, constipation, diarrhea, nausea and vomiting.  Endocrine: Negative.   Genitourinary:  Negative for decreased urine volume, difficulty urinating, dysuria, frequency and urgency.  Musculoskeletal:  Negative for arthralgias and myalgias.  Skin:  Positive for color change and wound.  Allergic/Immunologic: Negative.   Neurological:  Negative for dizziness and headaches.  Hematological: Negative.   Psychiatric/Behavioral:  Negative for confusion, hallucinations, sleep disturbance and suicidal ideas.   All other systems reviewed and are negative.       Objective:  BP (!) 142/98   Pulse (!) 106   Temp 98 F (36.7 C) (Temporal)   Ht 6\' 1"  (1.854 m)   Wt 199 lb 9.6 oz (90.5 kg)   SpO2 97%   BMI 26.33 kg/m    Wt Readings from Last 3 Encounters:  12/27/21 199 lb 9.6 oz (90.5 kg)  12/24/21 197 lb (89.4 kg)  12/18/21 197 lb 14.4 oz (89.8 kg)    Physical Exam Vitals and nursing note reviewed.  Constitutional:      General: He is not in acute distress.    Appearance: Normal appearance. He is not ill-appearing, toxic-appearing or diaphoretic.  HENT:     Head: Normocephalic and atraumatic.     Mouth/Throat:     Mouth: Mucous membranes are moist.     Pharynx: Oropharynx is clear.  Eyes:     Pupils: Pupils are equal, round, and reactive to light.  Cardiovascular:     Rate and Rhythm: Normal rate and regular rhythm.     Heart sounds: Normal heart sounds.  Pulmonary:     Effort: Pulmonary effort is normal.     Breath sounds: Normal breath sounds.  Musculoskeletal:     Cervical back: Neck supple.       Feet:  Skin:    General: Skin is warm and dry.     Capillary Refill: Capillary refill takes less  than 2 seconds.  Neurological:     General: No focal deficit present.     Mental Status: He is alert and oriented to person,  place, and time.  Psychiatric:        Mood and Affect: Mood normal.        Behavior: Behavior normal.        Thought Content: Thought content normal.        Judgment: Judgment normal.     Results for orders placed or performed during the hospital encounter of 12/12/21  Lipase, blood  Result Value Ref Range   Lipase 23 11 - 51 U/L  Comprehensive metabolic panel  Result Value Ref Range   Sodium 131 (L) 135 - 145 mmol/L   Potassium 3.2 (L) 3.5 - 5.1 mmol/L   Chloride 98 98 - 111 mmol/L   CO2 24 22 - 32 mmol/L   Glucose, Bld 95 70 - 99 mg/dL   BUN 15 8 - 23 mg/dL   Creatinine, Ser 1.38 (H) 0.61 - 1.24 mg/dL   Calcium 8.6 (L) 8.9 - 10.3 mg/dL   Total Protein 7.2 6.5 - 8.1 g/dL   Albumin 3.5 3.5 - 5.0 g/dL   AST 9 (L) 15 - 41 U/L   ALT 11 0 - 44 U/L   Alkaline Phosphatase 91 38 - 126 U/L   Total Bilirubin 1.2 0.3 - 1.2 mg/dL   GFR, Estimated 56 (L) >60 mL/min   Anion gap 9 5 - 15  CBC  Result Value Ref Range   WBC 16.9 (H) 4.0 - 10.5 K/uL   RBC 4.43 4.22 - 5.81 MIL/uL   Hemoglobin 13.7 13.0 - 17.0 g/dL   HCT 39.5 39.0 - 52.0 %   MCV 89.2 80.0 - 100.0 fL   MCH 30.9 26.0 - 34.0 pg   MCHC 34.7 30.0 - 36.0 g/dL   RDW 11.9 11.5 - 15.5 %   Platelets 246 150 - 400 K/uL   nRBC 0.0 0.0 - 0.2 %  Urinalysis, Routine w reflex microscopic  Result Value Ref Range   Color, Urine YELLOW YELLOW   APPearance CLEAR CLEAR   Specific Gravity, Urine 1.009 1.005 - 1.030   pH 6.0 5.0 - 8.0   Glucose, UA NEGATIVE NEGATIVE mg/dL   Hgb urine dipstick MODERATE (A) NEGATIVE   Bilirubin Urine NEGATIVE NEGATIVE   Ketones, ur NEGATIVE NEGATIVE mg/dL   Protein, ur 30 (A) NEGATIVE mg/dL   Nitrite NEGATIVE NEGATIVE   Leukocytes,Ua NEGATIVE NEGATIVE   RBC / HPF 0-5 0 - 5 RBC/hpf   WBC, UA 0-5 0 - 5 WBC/hpf   Bacteria, UA RARE (A) NONE SEEN   Mucus PRESENT    Hyaline Casts, UA PRESENT   Differential  Result Value Ref Range   Neutrophils Relative % 75 %   Neutro Abs 13.0 (H) 1.7 - 7.7 K/uL    Lymphocytes Relative 15 %   Lymphs Abs 2.5 0.7 - 4.0 K/uL   Monocytes Relative 9 %   Monocytes Absolute 1.5 (H) 0.1 - 1.0 K/uL   Eosinophils Relative 1 %   Eosinophils Absolute 0.1 0.0 - 0.5 K/uL   Basophils Relative 0 %   Basophils Absolute 0.1 0.0 - 0.1 K/uL   Immature Granulocytes 0 %   Abs Immature Granulocytes 0.06 0.00 - 0.07 K/uL  HIV Antibody (routine testing w rflx)  Result Value Ref Range   HIV Screen 4th Generation wRfx Non Reactive Non Reactive  Comprehensive metabolic panel  Result Value Ref  Range   Sodium 137 135 - 145 mmol/L   Potassium 4.1 3.5 - 5.1 mmol/L   Chloride 106 98 - 111 mmol/L   CO2 23 22 - 32 mmol/L   Glucose, Bld 99 70 - 99 mg/dL   BUN 14 8 - 23 mg/dL   Creatinine, Ser 1.36 (H) 0.61 - 1.24 mg/dL   Calcium 8.4 (L) 8.9 - 10.3 mg/dL   Total Protein 6.7 6.5 - 8.1 g/dL   Albumin 3.2 (L) 3.5 - 5.0 g/dL   AST 9 (L) 15 - 41 U/L   ALT 10 0 - 44 U/L   Alkaline Phosphatase 87 38 - 126 U/L   Total Bilirubin 1.2 0.3 - 1.2 mg/dL   GFR, Estimated 57 (L) >60 mL/min   Anion gap 8 5 - 15  CBC  Result Value Ref Range   WBC 11.4 (H) 4.0 - 10.5 K/uL   RBC 4.11 (L) 4.22 - 5.81 MIL/uL   Hemoglobin 12.7 (L) 13.0 - 17.0 g/dL   HCT 37.4 (L) 39.0 - 52.0 %   MCV 91.0 80.0 - 100.0 fL   MCH 30.9 26.0 - 34.0 pg   MCHC 34.0 30.0 - 36.0 g/dL   RDW 11.9 11.5 - 15.5 %   Platelets 228 150 - 400 K/uL   nRBC 0.0 0.0 - 0.2 %  Magnesium  Result Value Ref Range   Magnesium 2.3 1.7 - 2.4 mg/dL  Phosphorus  Result Value Ref Range   Phosphorus 2.3 (L) 2.5 - 4.6 mg/dL  Comprehensive metabolic panel  Result Value Ref Range   Sodium 138 135 - 145 mmol/L   Potassium 3.9 3.5 - 5.1 mmol/L   Chloride 108 98 - 111 mmol/L   CO2 22 22 - 32 mmol/L   Glucose, Bld 166 (H) 70 - 99 mg/dL   BUN 10 8 - 23 mg/dL   Creatinine, Ser 1.07 0.61 - 1.24 mg/dL   Calcium 8.8 (L) 8.9 - 10.3 mg/dL   Total Protein 7.1 6.5 - 8.1 g/dL   Albumin 3.3 (L) 3.5 - 5.0 g/dL   AST 49 (H) 15 - 41 U/L   ALT 35 0  - 44 U/L   Alkaline Phosphatase 93 38 - 126 U/L   Total Bilirubin 0.7 0.3 - 1.2 mg/dL   GFR, Estimated >60 >60 mL/min   Anion gap 8 5 - 15  Magnesium  Result Value Ref Range   Magnesium 2.2 1.7 - 2.4 mg/dL  CBC  Result Value Ref Range   WBC 10.7 (H) 4.0 - 10.5 K/uL   RBC 4.22 4.22 - 5.81 MIL/uL   Hemoglobin 12.9 (L) 13.0 - 17.0 g/dL   HCT 37.7 (L) 39.0 - 52.0 %   MCV 89.3 80.0 - 100.0 fL   MCH 30.6 26.0 - 34.0 pg   MCHC 34.2 30.0 - 36.0 g/dL   RDW 11.9 11.5 - 15.5 %   Platelets 246 150 - 400 K/uL   nRBC 0.0 0.0 - 0.2 %  Comprehensive metabolic panel  Result Value Ref Range   Sodium 138 135 - 145 mmol/L   Potassium 3.8 3.5 - 5.1 mmol/L   Chloride 106 98 - 111 mmol/L   CO2 21 (L) 22 - 32 mmol/L   Glucose, Bld 98 70 - 99 mg/dL   BUN 13 8 - 23 mg/dL   Creatinine, Ser 1.09 0.61 - 1.24 mg/dL   Calcium 8.7 (L) 8.9 - 10.3 mg/dL   Total Protein 7.2 6.5 - 8.1 g/dL   Albumin  3.5 3.5 - 5.0 g/dL   AST 33 15 - 41 U/L   ALT 33 0 - 44 U/L   Alkaline Phosphatase 89 38 - 126 U/L   Total Bilirubin 0.5 0.3 - 1.2 mg/dL   GFR, Estimated >60 >60 mL/min   Anion gap 11 5 - 15  CBC  Result Value Ref Range   WBC 17.9 (H) 4.0 - 10.5 K/uL   RBC 4.04 (L) 4.22 - 5.81 MIL/uL   Hemoglobin 12.6 (L) 13.0 - 17.0 g/dL   HCT 37.5 (L) 39.0 - 52.0 %   MCV 92.8 80.0 - 100.0 fL   MCH 31.2 26.0 - 34.0 pg   MCHC 33.6 30.0 - 36.0 g/dL   RDW 12.1 11.5 - 15.5 %   Platelets 307 150 - 400 K/uL   nRBC 0.0 0.0 - 0.2 %  Magnesium  Result Value Ref Range   Magnesium 2.3 1.7 - 2.4 mg/dL  Surgical pathology  Result Value Ref Range   SURGICAL PATHOLOGY      SURGICAL PATHOLOGY CASE: [id number] PATIENT: Wille Glaser Surgical Pathology Report     Clinical History: Acute cholecystitis (crm)     FINAL MICROSCOPIC DIAGNOSIS:  A. 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  The wall measures up to 1 cm in thickness. Contents: The gallbladder contains multiple green-yellow calculi measuring 0.8 to 1.5 cm. Cystic duct: The cystic duct is patent.  Adjacent to the cystic duct there is a 1.5 cm rubbery tan nodule. Block Summary: 1 block submitted (GRP 12/16/2021)    Final Diagnosis performed by Tobin Chad, MD.   Electronically signed 12/17/2021 Technical component performed at [facility], [place] [address] [place], [place] [address].  Professional component performed at [facility], [place] [address] [place], [place] [address].  Immunohistochemistry Technical component (if applicable) was performed at [facility]. [address] [place], [place], [place] [address].   IMMUNOHISTOCHEMISTRY DISCLAIMER (if applicable): Some of these immunohistochemical stains may have been developed and the performance characteristics determine by [facility]. Some may not have been cleared or approved by the U.S. Food and Drug Administration. The FDA has determined that such clearance or approval is not necessary. This test is used for clinical purposes. It should not be regarded as investigational or for research. This laboratory is certified under the [place] (CLIA-88) as qualified to perform high complexity clinical laboratory testing.  The controls stained ap propriately.        Pertinent labs & imaging results that were available during my care of the patient were reviewed by me and considered in my medical decision making.  Assessment & Plan:  Arrian was seen today for toe pain.  Diagnoses and all orders for this visit:  Cellulitis of great  toe of left foot Wound cleansed and dressed in office. Bactrim as prescribed. Wound care discussed in detail. Pt will update MyChart with images of toe. Follow up in 1 week for reevaluation, sooner if worsening.  -     sulfamethoxazole-trimethoprim (BACTRIM DS) 800-160 MG tablet; Take 1 tablet by mouth 2 (two) times daily for 10 days.     Continue all other maintenance medications.  Follow up plan: Return in about  1 week (around 01/03/2022), or if symptoms worsen or fail to improve, for wound recheck.   Continue healthy lifestyle choices, including diet (rich in fruits, vegetables, and lean proteins, and low in salt and simple carbohydrates) and exercise (at least 30 minutes of moderate physical activity daily).  Educational handout given for cellulitis   The above assessment and management plan was discussed with the patient. The patient verbalized understanding of and has agreed to the management plan. Patient is aware to call the clinic if they develop any new symptoms or if symptoms persist or worsen. Patient is aware when to return to the clinic for a follow-up visit. Patient educated on when it is appropriate to go to the emergency department.   Monia Pouch, FNP-C [facility] [phone]

## 2021-12-30 ENCOUNTER — Inpatient Hospital Stay (HOSPITAL_COMMUNITY)
Admission: RE | Admit: 2021-12-30 | Discharge: 2021-12-30 | Disposition: A | Discharge status: Home / Self Care | Payer: Medicare Other | Source: Ambulatory Visit | Attending: Hematology | Admitting: Hematology

## 2021-12-31 ENCOUNTER — Encounter: Payer: Self-pay | Admitting: *Deleted

## 2021-12-31 ENCOUNTER — Ambulatory Visit: Payer: Medicare Other | Attending: Internal Medicine | Admitting: Internal Medicine

## 2021-12-31 ENCOUNTER — Encounter: Payer: Self-pay | Admitting: Internal Medicine

## 2021-12-31 ENCOUNTER — Other Ambulatory Visit: Payer: Self-pay | Admitting: *Deleted

## 2021-12-31 VITALS — BP 122/84 | HR 104 | Ht 72.0 in | Wt 199.0 lb

## 2021-12-31 DIAGNOSIS — Z720 Tobacco use: Secondary | ICD-10-CM

## 2021-12-31 DIAGNOSIS — I2510 Atherosclerotic heart disease of native coronary artery without angina pectoris: Secondary | ICD-10-CM | POA: Diagnosis not present

## 2021-12-31 DIAGNOSIS — R0609 Other forms of dyspnea: Secondary | ICD-10-CM | POA: Diagnosis not present

## 2021-12-31 DIAGNOSIS — E7800 Pure hypercholesterolemia, unspecified: Secondary | ICD-10-CM

## 2021-12-31 DIAGNOSIS — Z136 Encounter for screening for cardiovascular disorders: Secondary | ICD-10-CM

## 2021-12-31 DIAGNOSIS — R000 Tachycardia, unspecified: Secondary | ICD-10-CM

## 2021-12-31 MED ORDER — FUROSEMIDE 40 MG PO TABS: 40.0000 mg | ORAL_TABLET | Freq: Every day | ORAL | 3 refills | Status: DC

## 2021-12-31 MED ORDER — ROSUVASTATIN CALCIUM 5 MG PO TABS: 5.0000 mg | ORAL_TABLET | Freq: Every day | ORAL | 3 refills | Status: DC

## 2021-12-31 NOTE — Patient Instructions (Addendum)
Medication Instructions:  Your physician has recommended you make the following change in your medication:  Start lasix 40 mg daily Start rosuvastatin 5 mg daily Continue other medications the same  Labwork: Lipid panel as soon as possible Non-fasting [facility] Lab or family doctor's office  Testing/Procedures: Your physician has requested that you have a lexiscan myoview. For further information please visit [email or web address]. Please follow instruction sheet, as given.  Follow-Up: Your physician recommends that you schedule a follow-up appointment in: 6 months  Any Other Special Instructions Will Be Listed Below (If Applicable).  If you need a refill on your cardiac medications before your next appointment, please call your pharmacy.

## 2021-12-31 NOTE — Addendum Note (Signed)
Addended by: Merlene Laughter on: 12/31/2021 03:34 PM   Modules accepted: Orders

## 2021-12-31 NOTE — Progress Notes (Signed)
Cardiology Office Note  Date: 12/31/2021   ID: Faisal, Stradling [birth year], MRN [id number]  PCP:  Baruch Gouty, FNP  Cardiologist:  Chalmers Guest, MD Electrophysiologist:  None   Reason for Office Visit: Evaluation of shortness of breath at the request of rakes, FNP   History of Present Illness: Juan Yang is a 66 y.o. male known to have Charcot Marie tooth disease, HTN, recently diagnosed right lung cancer was referred to cardiology clinic for evaluation of DOE at the request of rakes, FNP. Accompanied by wife.  Patient has been having DOE on minimal exertion associated with leg swelling for the last 1 year per wife however patient denied DOE on minimal exertion but acknowledged having some DOE in the last 1 year. Denied any angina, dizziness/lightness, syncope.  He quit smoking cigarettes 10 years ago however he switched to vaping and is currently vaping as it has less nicotine. Denied alcohol use or illicit drug abuse.  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   History of kidney stones    Hypertension    Peripheral neuropathy 10/29/2016   Pneumonia    Transient cerebral ischemic attack, unspecified 05/10/2020   stroke    Past Surgical History:  Procedure Laterality Date   BRONCHIAL NEEDLE ASPIRATION BIOPSY  11/29/2021   Procedure: BRONCHIAL NEEDLE ASPIRATION BIOPSIES;  Surgeon: Garner Nash, DO;  Location: [facility];  Service: Pulmonary;;   CHOLECYSTECTOMY N/A 12/13/2021   Procedure: LAPAROSCOPIC CHOLECYSTECTOMY;  Surgeon: Virl Cagey, MD;  Location: [facility];  Service: General;  Laterality: N/A;   COLONOSCOPY WITH PROPOFOL N/A 11/14/2016   Procedure: COLONOSCOPY WITH PROPOFOL;  Surgeon: Rogene Houston, MD;  Location: [facility];  Service: Endoscopy;  Laterality: N/A;  9:15   IR IMAGING GUIDED PORT INSERTION  12/24/2021   LUMBAR LAMINECTOMY/DECOMPRESSION MICRODISCECTOMY Right 05/24/2019   Procedure: LAMINECTOMY AND FORAMINOTOMY LUMBAR FIVE- SACRAL ONE RIGHT;  Surgeon: Vallarie Mare, MD;  Location: [facility];  Service: Neurosurgery;  Laterality: Right;  posterior/right   LUNG BIOPSY     MOHS SURGERY Right 05/2020   ear   MOHS SURGERY     POLYPECTOMY  11/14/2016   Procedure: POLYPECTOMY- SIGMOID COLON X3 TRANSVERSE COLON X2;  Surgeon: Rogene Houston, MD;  Location: [facility];  Service: Endoscopy;;   rt ankle surgery     Titanium hinge   TONSILLECTOMY     TRANSFORAMINAL LUMBAR INTERBODY FUSION (TLIF) WITH PEDICLE SCREW FIXATION 1 LEVEL N/A 05/02/2020   Procedure: Lumbar five Sacral one Transforaminal lumbar interbody fusion;  Surgeon: Vallarie Mare, MD;  Location: [facility];  Service: Neurosurgery;  Laterality: N/A;   VIDEO BRONCHOSCOPY WITH ENDOBRONCHIAL ULTRASOUND N/A 11/29/2021   Procedure: VIDEO BRONCHOSCOPY WITH ENDOBRONCHIAL ULTRASOUND;  Surgeon: Garner Nash, DO;  Location: [facility];  Service: Pulmonary;  Laterality: N/A;    Current Outpatient Medications  Medication Sig Dispense Refill   amLODipine (NORVASC) 2.5 MG  tablet Take 2.5 mg by mouth 2 (two) times daily.     aspirin EC (ADULT ASPIRIN REGIMEN) 81 MG tablet Take 81 mg by mouth daily.     calcium carbonate (TUMS - DOSED IN MG ELEMENTAL CALCIUM) 500 MG chewable tablet Chew 2 tablets by mouth daily.     clobetasol cream (TEMOVATE) 0.81 % Apply 1 application topically 2 (two) times daily as needed (rash).     cyclobenzaprine (FLEXERIL) 10 MG tablet Take 1 tablet (10 mg total) by mouth 3 (three) times daily as needed for muscle  spasms. 30 tablet 0   furosemide (LASIX) 40 MG tablet Take 1 tablet (40 mg total) by mouth daily. 30 tablet 3   Glucosamine HCl-MSM (GLUCOSAMINE-MSM PO) Take 1 tablet by mouth daily.     losartan (COZAAR) 25 MG tablet Take 25 mg by mouth daily.     metoprolol succinate (TOPROL-XL) 25 MG 24 hr tablet Take 1 tablet (25 mg total) by mouth daily. 90 tablet 3   ondansetron (ZOFRAN) 4 MG tablet Take 1 tablet (4 mg total) by mouth every 8 (eight) hours as needed for nausea or vomiting. 20 tablet 0   oxyCODONE (OXY IR/ROXICODONE) 5 MG immediate release tablet Take 5 mg by mouth every 4 (four) hours as needed for severe pain.     oxyCODONE (OXY IR/ROXICODONE) 5 MG immediate release tablet Take 1-2 tablets (5-10 mg total) by mouth every 4 (four) hours as needed for breakthrough pain or severe pain. 20 tablet 0   Potassium 99 MG TABS Take 99 mg by mouth daily as needed (low potassium).     rosuvastatin (CRESTOR) 5 MG tablet Take 1 tablet (5 mg total) by mouth daily. 30 tablet 3   sildenafil (VIAGRA) 100 MG tablet Take 100 mg by mouth daily as needed for erectile dysfunction.     sodium bicarbonate 650 MG tablet Take 650 mg by mouth 2 (two) times daily.     sulfamethoxazole-trimethoprim (BACTRIM DS) 800-160 MG tablet Take 1 tablet by mouth 2 (two) times daily for 10 days. 20 tablet 0   No current facility-administered medications for this visit.   Allergies:  Other, Atenolol, Cymbalta [duloxetine hcl], Latex, and Penicillins   Social History: The patient  reports that he quit smoking about 9 years ago. His smoking use included cigarettes. He has a 30.00 pack-year smoking history. He has never been exposed to tobacco smoke. He has never used smokeless tobacco. He reports that he does not drink alcohol and does not use drugs.   Family History: The patient's family history includes Arthritis in his sister; COPD in his father; Charcot-Marie-Tooth disease in his mother; Heart disease in his father.   ROS:   Please see the history of present illness. Otherwise, complete review of systems is positive for none.  All other systems are reviewed and negative.   Physical Exam: VS:  BP 122/84   Pulse (!) 104   Ht 6' (1.829 m)   Wt 199 lb (90.3 kg)   BMI 26.99 kg/m , BMI Body mass index is 26.99 kg/m.  Wt Readings from Last 3 Encounters:  12/31/21 199 lb (90.3 kg)  12/27/21 199 lb 9.6 oz (90.5 kg)  12/24/21 197 lb (89.4 kg)    General: Patient appears comfortable at rest. HEENT: Conjunctiva and lids normal, oropharynx clear with moist mucosa. Neck: Supple, no elevated JVP or carotid bruits, no thyromegaly. Lungs: Clear to auscultation, nonlabored breathing at rest. Cardiac: Regular rate and rhythm, no S3 or significant  systolic murmur, no pericardial rub. Abdomen: Soft, nontender, no hepatomegaly, bowel sounds present, no guarding or rebound. Extremities: No pitting edema, distal pulses 2+. Skin: Warm and dry. Musculoskeletal: No kyphosis. Neuropsychiatric: Alert and oriented x3, affect grossly appropriate.  ECG: Sinus tachycardia  Recent Labwork: 10/03/2021: TSH 3.090 12/15/2021: ALT 33; AST 33; BUN 13; Creatinine, Ser 1.09; Hemoglobin 12.6; Magnesium 2.3; Platelets 307; Potassium 3.8; Sodium 138  No results found for: "CHOL", "TRIG", "HDL", "CHOLHDL", "VLDL", "LDLCALC", "LDLDIRECT"  Other Studies Reviewed Today: CT chest from 11/2021 which showed advanced coronary artery calcifications  Assessment and Plan: Patient is a 66 year old M known to have Charcot Marie tooth disease, HTN, recently diagnosed right lung cancer was referred to cardiology clinic for evaluation of DOE at the request of rakes, FNP.  # DOE, rule out HFpEF and CAD -Start Lasix 40 mg once daily -Patient has DOE associated with leg swelling for 1 year.  A CT chest from 11/2021 showed advanced coronary artery calcifications. DOE being anginal equivalent, he will benefit from pharmacologic nuclear stress test  Carlton Adam). -He is scheduled for PFTs to further evaluate his DOE and probably for lung cancer management.  # Sinus tachycardia on electrocardiography -Patient has been having chronic pain resulting in sinus tachycardia on EKG.  He is on metoprolol succinate 25 mg once daily.  Would not recommend metoprolol or any rate controlling agents to control sinus tachycardia.  Pain management per PCP.  # Advanced coronary artery calcifications noted on CT chest from 10/23 -Continue aspirin 81 mg once daily -Start rosuvastatin 5 mg nightly. Patient stated he already has muscle cramps and if this statin aggravates it, he is instructed to hold statin. Obtain lipid panel.  # AAA 3.6 cm per CT scan from 2019 (per documentation and I am not able to find the report) -Obtain USG Doppler abdomen for AAA screening  # Nicotine abuse -Patient is currently vaping and I do not think he is wanting to quit anytime soon.  I have spent a total of 45 minutes with patient reviewing chart, EKGs, labs and examining patient as well as establishing an assessment and plan that was discussed with the patient.  > 50% of time was spent in direct patient care.      Medication Adjustments/Labs and Tests Ordered: Current medicines are reviewed at length with the patient today.  Concerns regarding medicines are outlined above.   Tests Ordered: Orders Placed This Encounter  Procedures   NM Myocar Multi W/Spect W/Wall Motion / EF   Lipid panel   VAS US [facility] MEDICARE SCREEN    Medication Changes: Meds ordered this encounter  Medications   furosemide (LASIX) 40 MG tablet    Sig: Take 1 tablet (40 mg total) by mouth daily.    Dispense:  30 tablet    Refill:  3    12/31/2021 NEW   rosuvastatin (CRESTOR) 5 MG tablet    Sig: Take 1 tablet (5 mg total) by mouth daily.    Dispense:  30 tablet    Refill:  3    12/31/2021 NEW    Disposition:  Follow up  6 months  Signed Joandry Slagter Fidel Levy, MD, 12/31/2021 12:38 PM     [facility] [place], [place],  [address]

## 2022-01-01 ENCOUNTER — Encounter: Payer: Self-pay | Admitting: Family Medicine

## 2022-01-01 ENCOUNTER — Ambulatory Visit (INDEPENDENT_AMBULATORY_CARE_PROVIDER_SITE_OTHER): Payer: Medicare Other | Admitting: Family Medicine

## 2022-01-01 ENCOUNTER — Ambulatory Visit (INDEPENDENT_AMBULATORY_CARE_PROVIDER_SITE_OTHER): Payer: Medicare Other

## 2022-01-01 VITALS — BP 128/95 | HR 113 | Temp 97.3°F | Ht 73.0 in | Wt 198.6 lb

## 2022-01-01 DIAGNOSIS — L03032 Cellulitis of left toe: Secondary | ICD-10-CM

## 2022-01-01 DIAGNOSIS — L0390 Cellulitis, unspecified: Secondary | ICD-10-CM | POA: Diagnosis not present

## 2022-01-01 DIAGNOSIS — E7800 Pure hypercholesterolemia, unspecified: Secondary | ICD-10-CM | POA: Diagnosis not present

## 2022-01-01 NOTE — Progress Notes (Signed)
Subjective:  Patient ID: Juan Yang, male    DOB: [birth year], 66 y.o.   MRN: [id number]  Patient Care Team: Baruch Gouty, FNP as PCP - General (Family Medicine) Mallipeddi, Quenten Raven, MD as PCP - Cardiology (Cardiology) Derek Jack, MD as Medical Oncologist (Medical Oncology) Brien Mates, RN as Oncology Nurse Navigator (Medical Oncology)   Chief Complaint:  Wound Check (1 week re check )   HPI: Juan Yang is a 66 y.o. male presenting on 01/01/2022 for Wound Check (1 week re check )   Wound Check Treated in ED: 12/27/2021. Previous treatment included oral antibiotics and wound cleansing or irrigation. There has been no drainage from the wound. The redness has improved. The swelling has improved. There is no pain present.       Relevant past medical, surgical, family, and social history reviewed and updated as indicated.  Allergies and medications reviewed and updated. Data reviewed: Chart in Epic.   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   History of kidney stones    Hypertension    Peripheral neuropathy 10/29/2016   Pneumonia    Transient cerebral ischemic attack, unspecified 05/10/2020   stroke    Past Surgical History:  Procedure Laterality Date   BRONCHIAL NEEDLE ASPIRATION BIOPSY  11/29/2021   Procedure: BRONCHIAL NEEDLE ASPIRATION BIOPSIES;  Surgeon: Garner Nash, DO;  Location: [facility];  Service: Pulmonary;;   CHOLECYSTECTOMY N/A 12/13/2021   Procedure: LAPAROSCOPIC CHOLECYSTECTOMY;  Surgeon: Virl Cagey,  MD;  Location: [facility];  Service: General;  Laterality: N/A;   COLONOSCOPY WITH PROPOFOL N/A 11/14/2016   Procedure: COLONOSCOPY WITH PROPOFOL;  Surgeon: Rogene Houston, MD;  [facility];  Service: Endoscopy;  Laterality: N/A;  9:15   IR IMAGING GUIDED PORT INSERTION  12/24/2021   LUMBAR LAMINECTOMY/DECOMPRESSION MICRODISCECTOMY Right 05/24/2019   Procedure: LAMINECTOMY AND FORAMINOTOMY LUMBAR FIVE- SACRAL ONE RIGHT;  Surgeon: Vallarie Mare, MD;  Location: [facility];  Service: Neurosurgery;  Laterality: Right;  posterior/right   LUNG BIOPSY     MOHS SURGERY Right 05/2020   ear   MOHS SURGERY     POLYPECTOMY  11/14/2016   Procedure: POLYPECTOMY- SIGMOID COLON X3 TRANSVERSE COLON X2;  Surgeon: Rogene Houston, MD;  [facility];  Service: Endoscopy;;   rt ankle surgery     Titanium hinge   TONSILLECTOMY     TRANSFORAMINAL LUMBAR INTERBODY FUSION (TLIF) WITH PEDICLE SCREW FIXATION 1 LEVEL N/A 05/02/2020   Procedure: Lumbar five Sacral one Transforaminal lumbar interbody fusion;  Surgeon: Vallarie Mare, MD;  Location: [facility];  Service: Neurosurgery;  Laterality: N/A;   VIDEO BRONCHOSCOPY WITH ENDOBRONCHIAL ULTRASOUND N/A 11/29/2021   Procedure: VIDEO BRONCHOSCOPY WITH ENDOBRONCHIAL ULTRASOUND;  Surgeon: Garner Nash, DO;  Location: [facility];  Service: Pulmonary;  Laterality: N/A;    Social History   Socioeconomic History   Marital status: Married  Spouse name: Not on file   Number of children: Not on file   Years of education: Not on file   Highest education level: Not on file  Occupational History   Not on file  Tobacco Use   Smoking status: Former    Packs/day: 1.50    Years: 20.00    Total pack years: 30.00    Types: Cigarettes    Quit date: 11/12/2012    Years since quitting: 9.1    Passive exposure: Never   Smokeless tobacco: Never   Tobacco comments:    Pt smokes 1 pack a month and vapes CBD oil. ARJ 11/22/21  Vaping Use   Vaping  Use: Some days   Substances: CBD  Substance and Sexual Activity   Alcohol use: No   Drug use: No   Sexual activity: Yes    Birth control/protection: None  Other Topics Concern   Not on file  Social History Narrative   Right handed   Lives with wife in one story home   Social Determinants of Health   Financial Resource Strain: Not on file  Food Insecurity: No Food Insecurity (12/19/2021)   Hunger Vital Sign    Worried About Running Out of Food in the Last Year: Never true    Ran Out of Food in the Last Year: Never true  Transportation Needs: No Transportation Needs (12/19/2021)   PRAPARE - Hydrologist (Medical): No    Lack of Transportation (Non-Medical): No  Physical Activity: Inactive (05/30/2020)   Exercise Vital Sign    Days of Exercise per Week: 0 days    Minutes of Exercise per Session: 0 min  Stress: Not on file  Social Connections: Not on file  Intimate Partner Violence: Not At Risk (12/18/2021)   Humiliation, Afraid, Rape, and Kick questionnaire    Fear of Current or Ex-Partner: No    Emotionally Abused: No    Physically Abused: No    Sexually Abused: No    Outpatient Encounter Medications as of 01/01/2022  Medication Sig   amLODipine (NORVASC) 2.5 MG tablet Take 2.5 mg by mouth 2 (two) times daily.   aspirin EC (ADULT ASPIRIN REGIMEN) 81 MG tablet Take 81 mg by mouth daily.   calcium carbonate (TUMS - DOSED IN MG ELEMENTAL CALCIUM) 500 MG chewable tablet Chew 2 tablets by mouth daily.   clobetasol cream (TEMOVATE) 2.77 % Apply 1 application topically 2 (two) times daily as needed (rash).   cyclobenzaprine (FLEXERIL) 10 MG tablet Take 1 tablet (10 mg total) by mouth 3 (three) times daily as needed for muscle spasms.   furosemide (LASIX) 40 MG tablet Take 1 tablet (40 mg total) by mouth daily.   Glucosamine HCl-MSM (GLUCOSAMINE-MSM PO) Take 1 tablet by mouth daily.   losartan (COZAAR) 25 MG tablet Take 25 mg by mouth daily.   metoprolol  succinate (TOPROL-XL) 25 MG 24 hr tablet Take 1 tablet (25 mg total) by mouth daily.   ondansetron (ZOFRAN) 4 MG tablet Take 1 tablet (4 mg total) by mouth every 8 (eight) hours as needed for nausea or vomiting.   oxyCODONE (OXY IR/ROXICODONE) 5 MG immediate release tablet Take 5 mg by mouth every 4 (four) hours as needed for severe pain.   oxyCODONE (OXY IR/ROXICODONE) 5 MG immediate release tablet Take 1-2 tablets (5-10 mg total) by mouth every 4 (four) hours as needed for breakthrough pain or severe pain.   Potassium 99 MG TABS Take 99 mg by mouth  daily as needed (low potassium).   rosuvastatin (CRESTOR) 5 MG tablet Take 1 tablet (5 mg total) by mouth daily.   sildenafil (VIAGRA) 100 MG tablet Take 100 mg by mouth daily as needed for erectile dysfunction.   sodium bicarbonate 650 MG tablet Take 650 mg by mouth 2 (two) times daily.   sulfamethoxazole-trimethoprim (BACTRIM DS) 800-160 MG tablet Take 1 tablet by mouth 2 (two) times daily for 10 days.   No facility-administered encounter medications on file as of 01/01/2022.    Allergies  Allergen Reactions   Other Hives    DUCK EGGS ONLY   Atenolol Diarrhea and Nausea Only    Pt unsure of allergy? Possible fainting?   Cymbalta [Duloxetine Hcl] Other (See Comments)    Pt states it shut his bladder down and could not urinate; states it made him lethargic as well.   Latex Itching   Penicillins Hives    Did it involve swelling of the face/tongue/throat, SOB, or low BP? Unknown Did it involve sudden or severe rash/hives, skin peeling, or any reaction on the inside of your mouth or nose? Unknown Did you need to seek medical attention at a hospital or doctor's office? yes When did it last happen?      Childhood If all above answers are "NO", may proceed with cephalosporin use.    Review of Systems  Constitutional:  Negative for activity change, appetite change, chills, diaphoresis, fatigue, fever and unexpected weight change.  Respiratory:   Negative for cough and shortness of breath.   Cardiovascular:  Negative for chest pain, palpitations and leg swelling.  Genitourinary:  Negative for decreased urine volume and difficulty urinating.  Skin:  Positive for color change and wound.  Neurological:  Negative for weakness.  Psychiatric/Behavioral:  Negative for confusion.   All other systems reviewed and are negative.       Objective:  BP (!) 128/95   Pulse (!) 113   Temp (!) 97.3 F (36.3 C) (Temporal)   Ht 6\' 1"  (1.854 m)   Wt 198 lb 9.6 oz (90.1 kg)   SpO2 96%   BMI 26.20 kg/m    Wt Readings from Last 3 Encounters:  01/01/22 198 lb 9.6 oz (90.1 kg)  12/31/21 199 lb (90.3 kg)  12/27/21 199 lb 9.6 oz (90.5 kg)    Physical Exam Vitals and nursing note reviewed.  Constitutional:      Appearance: Normal appearance.  HENT:     Head: Normocephalic and atraumatic.  Eyes:     Pupils: Pupils are equal, round, and reactive to light.  Cardiovascular:     Rate and Rhythm: Normal rate.  Pulmonary:     Effort: Pulmonary effort is normal.  Skin:    General: Skin is warm and dry.     Capillary Refill: Capillary refill takes less than 2 seconds.     Comments: Wound to left great toe improved but still swollen and erythematous. No drainage.   Neurological:     General: No focal deficit present.     Mental Status: He is alert and oriented to person, place, and time.  Psychiatric:        Mood and Affect: Mood normal.        Behavior: Behavior normal.        Thought Content: Thought content normal.        Judgment: Judgment normal.     Results for orders placed or performed during the hospital encounter of 12/12/21  Lipase, blood  Result  Value Ref Range   Lipase 23 11 - 51 U/L  Comprehensive metabolic panel  Result Value Ref Range   Sodium 131 (L) 135 - 145 mmol/L   Potassium 3.2 (L) 3.5 - 5.1 mmol/L   Chloride 98 98 - 111 mmol/L   CO2 24 22 - 32 mmol/L   Glucose, Bld 95 70 - 99 mg/dL   BUN 15 8 - 23 mg/dL    Creatinine, Ser 1.38 (H) 0.61 - 1.24 mg/dL   Calcium 8.6 (L) 8.9 - 10.3 mg/dL   Total Protein 7.2 6.5 - 8.1 g/dL   Albumin 3.5 3.5 - 5.0 g/dL   AST 9 (L) 15 - 41 U/L   ALT 11 0 - 44 U/L   Alkaline Phosphatase 91 38 - 126 U/L   Total Bilirubin 1.2 0.3 - 1.2 mg/dL   GFR, Estimated 56 (L) >60 mL/min   Anion gap 9 5 - 15  CBC  Result Value Ref Range   WBC 16.9 (H) 4.0 - 10.5 K/uL   RBC 4.43 4.22 - 5.81 MIL/uL   Hemoglobin 13.7 13.0 - 17.0 g/dL   HCT 39.5 39.0 - 52.0 %   MCV 89.2 80.0 - 100.0 fL   MCH 30.9 26.0 - 34.0 pg   MCHC 34.7 30.0 - 36.0 g/dL   RDW 11.9 11.5 - 15.5 %   Platelets 246 150 - 400 K/uL   nRBC 0.0 0.0 - 0.2 %  Urinalysis, Routine w reflex microscopic  Result Value Ref Range   Color, Urine YELLOW YELLOW   APPearance CLEAR CLEAR   Specific Gravity, Urine 1.009 1.005 - 1.030   pH 6.0 5.0 - 8.0   Glucose, UA NEGATIVE NEGATIVE mg/dL   Hgb urine dipstick MODERATE (A) NEGATIVE   Bilirubin Urine NEGATIVE NEGATIVE   Ketones, ur NEGATIVE NEGATIVE mg/dL   Protein, ur 30 (A) NEGATIVE mg/dL   Nitrite NEGATIVE NEGATIVE   Leukocytes,Ua NEGATIVE NEGATIVE   RBC / HPF 0-5 0 - 5 RBC/hpf   WBC, UA 0-5 0 - 5 WBC/hpf   Bacteria, UA RARE (A) NONE SEEN   Mucus PRESENT    Hyaline Casts, UA PRESENT   Differential  Result Value Ref Range   Neutrophils Relative % 75 %   Neutro Abs 13.0 (H) 1.7 - 7.7 K/uL   Lymphocytes Relative 15 %   Lymphs Abs 2.5 0.7 - 4.0 K/uL   Monocytes Relative 9 %   Monocytes Absolute 1.5 (H) 0.1 - 1.0 K/uL   Eosinophils Relative 1 %   Eosinophils Absolute 0.1 0.0 - 0.5 K/uL   Basophils Relative 0 %   Basophils Absolute 0.1 0.0 - 0.1 K/uL   Immature Granulocytes 0 %   Abs Immature Granulocytes 0.06 0.00 - 0.07 K/uL  HIV Antibody (routine testing w rflx)  Result Value Ref Range   HIV Screen 4th Generation wRfx Non Reactive Non Reactive  Comprehensive metabolic panel  Result Value Ref Range   Sodium 137 135 - 145 mmol/L   Potassium 4.1 3.5 - 5.1  mmol/L   Chloride 106 98 - 111 mmol/L   CO2 23 22 - 32 mmol/L   Glucose, Bld 99 70 - 99 mg/dL   BUN 14 8 - 23 mg/dL   Creatinine, Ser 1.36 (H) 0.61 - 1.24 mg/dL   Calcium 8.4 (L) 8.9 - 10.3 mg/dL   Total Protein 6.7 6.5 - 8.1 g/dL   Albumin 3.2 (L) 3.5 - 5.0 g/dL   AST 9 (L) 15 - 41 U/L  ALT 10 0 - 44 U/L   Alkaline Phosphatase 87 38 - 126 U/L   Total Bilirubin 1.2 0.3 - 1.2 mg/dL   GFR, Estimated 57 (L) >60 mL/min   Anion gap 8 5 - 15  CBC  Result Value Ref Range   WBC 11.4 (H) 4.0 - 10.5 K/uL   RBC 4.11 (L) 4.22 - 5.81 MIL/uL   Hemoglobin 12.7 (L) 13.0 - 17.0 g/dL   HCT 37.4 (L) 39.0 - 52.0 %   MCV 91.0 80.0 - 100.0 fL   MCH 30.9 26.0 - 34.0 pg   MCHC 34.0 30.0 - 36.0 g/dL   RDW 11.9 11.5 - 15.5 %   Platelets 228 150 - 400 K/uL   nRBC 0.0 0.0 - 0.2 %  Magnesium  Result Value Ref Range   Magnesium 2.3 1.7 - 2.4 mg/dL  Phosphorus  Result Value Ref Range   Phosphorus 2.3 (L) 2.5 - 4.6 mg/dL  Comprehensive metabolic panel  Result Value Ref Range   Sodium 138 135 - 145 mmol/L   Potassium 3.9 3.5 - 5.1 mmol/L   Chloride 108 98 - 111 mmol/L   CO2 22 22 - 32 mmol/L   Glucose, Bld 166 (H) 70 - 99 mg/dL   BUN 10 8 - 23 mg/dL   Creatinine, Ser 1.07 0.61 - 1.24 mg/dL   Calcium 8.8 (L) 8.9 - 10.3 mg/dL   Total Protein 7.1 6.5 - 8.1 g/dL   Albumin 3.3 (L) 3.5 - 5.0 g/dL   AST 49 (H) 15 - 41 U/L   ALT 35 0 - 44 U/L   Alkaline Phosphatase 93 38 - 126 U/L   Total Bilirubin 0.7 0.3 - 1.2 mg/dL   GFR, Estimated >60 >60 mL/min   Anion gap 8 5 - 15  Magnesium  Result Value Ref Range   Magnesium 2.2 1.7 - 2.4 mg/dL  CBC  Result Value Ref Range   WBC 10.7 (H) 4.0 - 10.5 K/uL   RBC 4.22 4.22 - 5.81 MIL/uL   Hemoglobin 12.9 (L) 13.0 - 17.0 g/dL   HCT 37.7 (L) 39.0 - 52.0 %   MCV 89.3 80.0 - 100.0 fL   MCH 30.6 26.0 - 34.0 pg   MCHC 34.2 30.0 - 36.0 g/dL   RDW 11.9 11.5 - 15.5 %   Platelets 246 150 - 400 K/uL   nRBC 0.0 0.0 - 0.2 %  Comprehensive metabolic panel  Result  Value Ref Range   Sodium 138 135 - 145 mmol/L   Potassium 3.8 3.5 - 5.1 mmol/L   Chloride 106 98 - 111 mmol/L   CO2 21 (L) 22 - 32 mmol/L   Glucose, Bld 98 70 - 99 mg/dL   BUN 13 8 - 23 mg/dL   Creatinine, Ser 1.09 0.61 - 1.24 mg/dL   Calcium 8.7 (L) 8.9 - 10.3 mg/dL   Total Protein 7.2 6.5 - 8.1 g/dL   Albumin 3.5 3.5 - 5.0 g/dL   AST 33 15 - 41 U/L   ALT 33 0 - 44 U/L   Alkaline Phosphatase 89 38 - 126 U/L   Total Bilirubin 0.5 0.3 - 1.2 mg/dL   GFR, Estimated >60 >60 mL/min   Anion gap 11 5 - 15  CBC  Result Value Ref Range   WBC 17.9 (H) 4.0 - 10.5 K/uL   RBC 4.04 (L) 4.22 - 5.81 MIL/uL   Hemoglobin 12.6 (L) 13.0 - 17.0 g/dL   HCT 37.5 (L) 39.0 - 52.0 %  MCV 92.8 80.0 - 100.0 fL   MCH 31.2 26.0 - 34.0 pg   MCHC 33.6 30.0 - 36.0 g/dL   RDW 12.1 11.5 - 15.5 %   Platelets 307 150 - 400 K/uL   nRBC 0.0 0.0 - 0.2 %  Magnesium  Result Value Ref Range   Magnesium 2.3 1.7 - 2.4 mg/dL  Surgical pathology  Result Value Ref Range   SURGICAL PATHOLOGY      SURGICAL PATHOLOGY CASE: [id number] PATIENT: Wille Glaser Surgical Pathology Report     Clinical History: Acute cholecystitis (crm)     FINAL MICROSCOPIC DIAGNOSIS:  A. 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  The wall measures up to 1 cm in thickness. Contents: The gallbladder contains multiple green-yellow calculi measuring 0.8 to 1.5 cm. Cystic duct: The cystic duct is patent.  Adjacent to the cystic duct there is a 1.5 cm rubbery tan nodule. Block Summary: 1 block submitted (GRP 12/16/2021)    Final Diagnosis performed by Tobin Chad, MD.   Electronically signed  12/17/2021 Technical component performed at [facility], [place] [address] [place], [place] [address].  Professional component performed at [facility], [place] [address], [place], [place] [address].  Immunohistochemistry Technical component (if applicable) was performed at [facility]. [address] [place], [place], [place] [address].   IMMUNOHISTOCHEMISTRY DISCLAIMER (if applicable): Some of these immunohistochemical stains may have been developed and the performance characteristics determine by [facility]. Some may not have been cleared or approved by the U.S. Food and Drug Administration. The FDA has determined that such clearance or approval is not necessary. This test is used for clinical purposes. It should not be regarded as investigational or for research. This laboratory is certified under the [place] (CLIA-88) as qualified to perform high complexity clinical laboratory testing.  The controls stained ap propriately.        Pertinent labs & imaging results that were available during my care of the patient were reviewed by me and considered in my medical decision making.  Assessment & Plan:  Jayme was seen today for wound check.  Diagnoses and all orders for this visit:  Cellulitis of great toe of left foot Improving but still present. Imaging without concerns for osteomyelitis, will notify pt if radiology reading differs. Continue antibiotic therapy. Return in 1 week if not improving.  -     DG Toe Great Left; Future     Continue all other maintenance medications.  Follow up plan: Return in about 1 week (around 01/08/2022), or if symptoms worsen or fail to improve.   Continue healthy lifestyle choices, including diet (rich in fruits, vegetables, and lean proteins, and low in salt and simple carbohydrates) and exercise (at least 30 minutes of moderate  physical activity daily).  Educational handout given for cellulitis   The above assessment and management plan was discussed with the patient. The patient verbalized understanding of and has agreed to the management plan. Patient is aware to call the clinic if they develop any new symptoms or if symptoms persist or worsen. Patient is aware when to return to the clinic for a follow-up visit. Patient educated on when it is appropriate to go to  the emergency department.   Monia Pouch, FNP-C [facility] [phone]

## 2022-01-02 LAB — LIPID PANEL
Chol/HDL Ratio: 5.8 ratio — ABNORMAL HIGH (ref 0.0–5.0)
Cholesterol, Total: 185 mg/dL (ref 100–199)
HDL: 32 mg/dL — ABNORMAL LOW (ref >39)
LDL Chol Calc (NIH): 117 mg/dL — ABNORMAL HIGH (ref 0–99)
Triglycerides: 204 mg/dL — ABNORMAL HIGH (ref 0–149)
VLDL Cholesterol Cal: 36 mg/dL (ref 5–40)

## 2022-01-06 ENCOUNTER — Encounter: Payer: Self-pay | Admitting: Family Medicine

## 2022-01-06 ENCOUNTER — Inpatient Hospital Stay (HOSPITAL_COMMUNITY): Admission: RE | Admit: 2022-01-06 | Payer: Medicare Other | Source: Ambulatory Visit

## 2022-01-07 ENCOUNTER — Encounter: Payer: Medicare Other | Admitting: Family Medicine

## 2022-01-07 ENCOUNTER — Encounter: Payer: Medicare Other | Admitting: Thoracic Surgery (Cardiothoracic Vascular Surgery)

## 2022-01-08 ENCOUNTER — Emergency Department (HOSPITAL_COMMUNITY)
Admission: EM | Admit: 2022-01-08 | Discharge: 2022-01-08 | Disposition: A | Discharge status: Home / Self Care | Payer: Medicare Other | Attending: Emergency Medicine | Admitting: Emergency Medicine

## 2022-01-08 ENCOUNTER — Other Ambulatory Visit: Payer: Self-pay

## 2022-01-08 ENCOUNTER — Encounter (HOSPITAL_COMMUNITY): Payer: Self-pay | Admitting: *Deleted

## 2022-01-08 ENCOUNTER — Ambulatory Visit (INDEPENDENT_AMBULATORY_CARE_PROVIDER_SITE_OTHER): Payer: Medicare Other | Admitting: Family Medicine

## 2022-01-08 ENCOUNTER — Encounter: Payer: Self-pay | Admitting: Family Medicine

## 2022-01-08 VITALS — BP 123/85 | HR 100 | Temp 97.6°F | Ht 73.0 in | Wt 197.6 lb

## 2022-01-08 DIAGNOSIS — M79675 Pain in left toe(s): Secondary | ICD-10-CM | POA: Diagnosis present

## 2022-01-08 DIAGNOSIS — I129 Hypertensive chronic kidney disease with stage 1 through stage 4 chronic kidney disease, or unspecified chronic kidney disease: Secondary | ICD-10-CM | POA: Insufficient documentation

## 2022-01-08 DIAGNOSIS — Z7982 Long term (current) use of aspirin: Secondary | ICD-10-CM | POA: Insufficient documentation

## 2022-01-08 DIAGNOSIS — L03032 Cellulitis of left toe: Secondary | ICD-10-CM | POA: Diagnosis not present

## 2022-01-08 DIAGNOSIS — N183 Chronic kidney disease, stage 3 unspecified: Secondary | ICD-10-CM | POA: Diagnosis not present

## 2022-01-08 DIAGNOSIS — X58XXXD Exposure to other specified factors, subsequent encounter: Secondary | ICD-10-CM | POA: Diagnosis not present

## 2022-01-08 DIAGNOSIS — S91102D Unspecified open wound of left great toe without damage to nail, subsequent encounter: Secondary | ICD-10-CM | POA: Insufficient documentation

## 2022-01-08 DIAGNOSIS — Z91040 Latex allergy status: Secondary | ICD-10-CM | POA: Diagnosis not present

## 2022-01-08 DIAGNOSIS — Z8673 Personal history of transient ischemic attack (TIA), and cerebral infarction without residual deficits: Secondary | ICD-10-CM | POA: Insufficient documentation

## 2022-01-08 DIAGNOSIS — L0390 Cellulitis, unspecified: Secondary | ICD-10-CM

## 2022-01-08 DIAGNOSIS — Z79899 Other long term (current) drug therapy: Secondary | ICD-10-CM | POA: Insufficient documentation

## 2022-01-08 LAB — CBC WITH DIFFERENTIAL/PLATELET
Abs Immature Granulocytes: 0.03 10*3/uL (ref 0.00–0.07)
Basophils Absolute: 0.1 10*3/uL (ref 0.0–0.1)
Basophils Relative: 1 %
Eosinophils Absolute: 0.2 10*3/uL (ref 0.0–0.5)
Eosinophils Relative: 2 %
HCT: 37.9 % — ABNORMAL LOW (ref 39.0–52.0)
Hemoglobin: 12.6 g/dL — ABNORMAL LOW (ref 13.0–17.0)
Immature Granulocytes: 0 %
Lymphocytes Relative: 27 %
Lymphs Abs: 2.4 10*3/uL (ref 0.7–4.0)
MCH: 30.4 pg (ref 26.0–34.0)
MCHC: 33.2 g/dL (ref 30.0–36.0)
MCV: 91.3 fL (ref 80.0–100.0)
Monocytes Absolute: 0.6 10*3/uL (ref 0.1–1.0)
Monocytes Relative: 7 %
Neutro Abs: 5.6 10*3/uL (ref 1.7–7.7)
Neutrophils Relative %: 63 %
Platelets: 275 10*3/uL (ref 150–400)
RBC: 4.15 MIL/uL — ABNORMAL LOW (ref 4.22–5.81)
RDW: 12.3 % (ref 11.5–15.5)
WBC: 8.9 10*3/uL (ref 4.0–10.5)
nRBC: 0 % (ref 0.0–0.2)

## 2022-01-08 LAB — LACTIC ACID, PLASMA: Lactic Acid, Venous: 1.2 mmol/L (ref 0.5–1.9)

## 2022-01-08 LAB — BASIC METABOLIC PANEL
Anion gap: 10 (ref 5–15)
BUN: 14 mg/dL (ref 8–23)
CO2: 23 mmol/L (ref 22–32)
Calcium: 9 mg/dL (ref 8.9–10.3)
Chloride: 102 mmol/L (ref 98–111)
Creatinine, Ser: 1.4 mg/dL — ABNORMAL HIGH (ref 0.61–1.24)
GFR, Estimated: 55 mL/min — ABNORMAL LOW (ref >60)
Glucose, Bld: 90 mg/dL (ref 70–99)
Potassium: 3.8 mmol/L (ref 3.5–5.1)
Sodium: 135 mmol/L (ref 135–145)

## 2022-01-08 MED ORDER — LACTATED RINGERS IV BOLUS
500.0000 mL | Freq: Once | INTRAVENOUS | Status: AC
  Administered 2022-01-08: 500 mL via INTRAVENOUS

## 2022-01-08 MED ORDER — DEXTROSE 5 % IV SOLN
1500.0000 mg | Freq: Once | INTRAVENOUS | Status: AC
  Administered 2022-01-08: 1500 mg via INTRAVENOUS
  Filled 2022-01-08 (×2): qty 75

## 2022-01-08 NOTE — ED Notes (Signed)
Waiting on medication from [facility] to be given before pt is discharged.

## 2022-01-08 NOTE — ED Triage Notes (Signed)
Pt in c/o L big toe pain onset x 13 days, pt just completed Bactrim for cellulitis at the site, pt reports the area has not improved, pt ambulatory with pain, A&O x4

## 2022-01-08 NOTE — ED Notes (Signed)
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

## 2022-01-08 NOTE — ED Provider Notes (Signed)
[facility] EMERGENCY DEPARTMENT Provider Note   CSN: [id number] Arrival date & time: 01/08/22  1434     History  Chief Complaint  Patient presents with   Toe Pain    Juan Yang is a 66 y.o. male.  66 year old male presents today for evaluation of left great toe redness, swelling, wound.  This has been ongoing for about 13 days at this point.  Initially evaluated by PCP and started on Bactrim on 11/17.  He states he believes this is due to pain he has been feeling secondary to recent cholecystectomy which is causing him to curl up his toes throughout the day.  He believes he broke his toe against additional compression ulcerations.  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  He does not feel any pain until he developed redness and ulceration over the toe.  Initially he had drainage.  Currently the ulceration has necrotic tissue.  He has completed the Bactrim course.  Followed up with his PCP today and recommended to come to the ED due to failure of outpatient antibiotic therapy.  The history is provided by the patient. No language interpreter was used.       Home Medications Prior to Admission medications   Medication Sig Start Date End Date Taking? Authorizing Provider  amLODipine (NORVASC) 2.5 MG tablet Take 2.5 mg by mouth 2 (two) times daily. 04/18/19  Yes [provider]  aspirin EC (ADULT ASPIRIN REGIMEN) 81 MG tablet Take 325 mg by mouth daily. 09/06/21  Yes [provider]  calcium carbonate (TUMS - DOSED IN MG ELEMENTAL CALCIUM) 500 MG chewable tablet Chew 2 tablets by mouth daily.   Yes [provider]  clobetasol cream (TEMOVATE) 0.03 % Apply 1 application topically 2 (two) times daily as needed (rash).   Yes [provider]  cyclobenzaprine (FLEXERIL) 10 MG tablet Take 1 tablet (10 mg total) by mouth 3 (three) times daily as needed for muscle spasms. 05/03/20  Yes Vallarie Mare, MD  furosemide (LASIX) 40 MG tablet Take 1 tablet (40 mg total) by mouth daily. 12/31/21  Yes Mallipeddi, Vishnu P, MD  Glucosamine HCl-MSM (GLUCOSAMINE-MSM PO) Take 1 tablet by mouth daily.   Yes [provider]  losartan (COZAAR) 25 MG tablet Take 25 mg by mouth daily. 07/23/20  Yes [provider]  metoprolol succinate (TOPROL-XL) 25 MG 24 hr tablet Take 1 tablet (25 mg total) by mouth daily. 10/03/21  Yes Rakes, Connye Burkitt, FNP  ondansetron (ZOFRAN) 4 MG tablet Take 1 tablet (4 mg total) by mouth every 8 (eight) hours as needed for nausea or vomiting. 12/12/21  Yes Rakes, Connye Burkitt, FNP  oxyCODONE (OXY IR/ROXICODONE) 5 MG immediate release tablet Take 5 mg by mouth every 4 (four) hours as needed for severe pain.   Yes [provider]  Potassium 99 MG TABS Take 99 mg by mouth daily as needed (low potassium).   Yes [provider]  sildenafil (VIAGRA) 100 MG tablet Take 100 mg by mouth daily as needed for erectile dysfunction.   Yes [provider]  sodium bicarbonate 650 MG tablet Take 650 mg by mouth 2 (two) times daily. 11/09/20  Yes [provider]  oxyCODONE (OXY IR/ROXICODONE) 5 MG immediate release tablet Take 1-2 tablets (5-10 mg total) by mouth every 4 (four) hours as needed for breakthrough pain or severe pain. Patient not taking: Reported on 01/08/2022 12/15/21   Manuella Ghazi,  Pratik D, DO  rosuvastatin (CRESTOR) 5 MG tablet Take 1 tablet (5 mg total) by mouth daily. Patient not taking: Reported on 01/08/2022 12/31/21   Mallipeddi, Quenten Raven, MD      Allergies    Other, Atenolol, Cymbalta [duloxetine hcl], Latex, and Penicillins    Review of Systems   Review of Systems  Constitutional:  Negative for chills and fever.  Musculoskeletal:  Negative for arthralgias.  Skin:  Positive for wound.  All other systems reviewed and are negative.   Physical Exam Updated Vital Signs BP (!) 142/92 (BP Location: Right Arm)   Pulse 99   Temp  97.7 F (36.5 C)   Resp 16   Ht 6' (1.829 m)   SpO2 100%   BMI 26.80 kg/m  Physical Exam Vitals and nursing note reviewed.  Constitutional:      General: He is not in acute distress.    Appearance: Normal appearance. He is not ill-appearing.  HENT:     Head: Normocephalic and atraumatic.     Nose: Nose normal.  Eyes:     Conjunctiva/sclera: Conjunctivae normal.  Cardiovascular:     Rate and Rhythm: Normal rate and regular rhythm.  Pulmonary:     Effort: Pulmonary effort is normal. No respiratory distress.     Breath sounds: Normal breath sounds. No wheezing or rales.  Musculoskeletal:        General: No swelling or deformity. Normal range of motion.  Skin:    Findings: No rash.  Neurological:     Mental Status: He is alert.     ED Results / Procedures / Treatments   Labs (all labs ordered are listed, but only abnormal results are displayed) Labs Reviewed  CULTURE, BLOOD (ROUTINE X 2)  CULTURE, BLOOD (ROUTINE X 2)  CBC WITH DIFFERENTIAL/PLATELET  BASIC METABOLIC PANEL  LACTIC ACID, PLASMA  LACTIC ACID, PLASMA    EKG None  Radiology No results found.  Procedures Procedures    Medications Ordered in ED Medications - No data to display  ED Course/ Medical Decision Making/ A&P                           Medical Decision Making Amount and/or Complexity of Data Reviewed Labs: ordered.   Medical Decision Making / ED Course   This patient presents to the ED for concern of left great toe wound, redness, this involves an extensive number of treatment options, and is a complaint that carries with it a high risk of complications and morbidity.  The differential diagnosis includes osteomyelitis, cellulitis, abscess, septic joint patient seen by  MDM: 66 year old male presents for evaluation of wound and redness to left great toe.  Was treated with Bactrim for 10 days without improvement.  Denies fever.  Initial vital signs not SIRS positive.  Will obtain blood  work.  Recently did have an x-ray done by PCP which did not show any evidence of osteomyelitis or other acute process. CBC is without leukocytosis.  Hemoglobin of 12.6 which is around his baseline.  BMP shows creatinine of 1.40 which is above his baseline.  Will give 500 fluid bolus.  Otherwise BMP is unremarkable.  Without lactic acidosis.  Blood cultures collected.  Anaerobic and aerobic wound culture collected.  Will provide dose of Dalvance.  This will be couriered from [place].  We will give him infectious disease follow-up.  We will give a podiatry follow-up.  Advised patient to call his primary  care provider to have them order an MRI of his left great toe until he follows up with podiatry.  Patient would like to avoid admission if at all possible.  Given there is no signs of sepsis.  We will give him a dose of Dalvance and discharged him with close follow-up.  Patient discussed with attending who is in agreement with plan.  Additional history obtained: -Additional history obtained from recent PCP visits. -External records from outside source obtained and reviewed including: Chart review including previous notes, labs, imaging, consultation notes   Lab Tests: -I ordered, reviewed, and interpreted labs.   The pertinent results include:   Labs Reviewed  CBC WITH DIFFERENTIAL/PLATELET - Abnormal; Notable for the following components:      Result Value   RBC 4.15 (*)    Hemoglobin 12.6 (*)    HCT 37.9 (*)    All other components within normal limits  BASIC METABOLIC PANEL - Abnormal; Notable for the following components:   Creatinine, Ser 1.40 (*)    GFR, Estimated 55 (*)    All other components within normal limits  CULTURE, BLOOD (ROUTINE X 2)  CULTURE, BLOOD (ROUTINE X 2)  AEROBIC/ANAEROBIC CULTURE W GRAM STAIN (SURGICAL/DEEP WOUND)  LACTIC ACID, PLASMA      EKG  EKG Interpretation  Date/Time:    Ventricular Rate:    PR Interval:    QRS Duration:   QT Interval:    QTC  Calculation:   R Axis:     Text Interpretation:          Medicines ordered and prescription drug management: Meds ordered this encounter  Medications   dalbavancin (DALVANCE) 1,500 mg in dextrose 5 % 500 mL IVPB    Order Specific Question:   Indication:    Answer:   Cellulitis   lactated ringers bolus 500 mL    -I have reviewed the patients home medicines and have made adjustments as needed  Reevaluation: After the interventions noted above, I reevaluated the patient and found that they have :stayed the same  Co morbidities that complicate the patient evaluation  Past Medical History:  Diagnosis Date   AAA (abdominal aortic aneurysm) ([place])    3.2 cm 02/01/18 CT, recommended [place] in 3 years   Anxiety    Arthritis    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  Discharged in stable condition.  No evidence of sepsis.  Will give follow-up with podiatry, infectious disease.  He will call his primary care provider tomorrow to schedule an outpatient MRI.  Tricked return precautions discussed.  Final Clinical Impression(s) / ED Diagnoses Final diagnoses:  Open wound of left great toe, subsequent encounter    Rx / DC Orders ED Discharge Orders          Ordered    Ambulatory referral to Infectious Disease       Comments: Cellulitis patient:  Received dalbavancin on 01/08/2022.  01/08/22 1816              Evlyn Courier, PA-C 01/08/22 1950    Noemi Chapel, MD 01/09/22 1323

## 2022-01-08 NOTE — Progress Notes (Signed)
Pharmacy Antibiotic Note  Juan Yang is a 66 y.o. male admitted on 01/08/2022 with L great toe cellulitis.  Pharmacy has been consulted for Dalbavancin dosing.  Plan: Give dalbavancin 1500mg  IV x1 Ambulatory referral to Infectious Disease placed  Height: 6' (182.9 cm) IBW/kg (Calculated) : 77.6  Temp (24hrs), Avg:97.7 F (36.5 C), Min:97.6 F (36.4 C), Max:97.7 F (36.5 C)  Recent Labs  Lab 01/08/22 1626  WBC 8.9  CREATININE 1.40*  LATICACIDVEN 1.2    Estimated Creatinine Clearance: 57 mL/min (A) (by C-G formula based on SCr of 1.4 mg/dL (H)).    Allergies  Allergen Reactions   Other Hives    DUCK EGGS ONLY   Atenolol Diarrhea and Nausea Only    Pt unsure of allergy? Possible fainting?   Cymbalta [Duloxetine Hcl] Other (See Comments)    Pt states it shut his bladder down and could not urinate; states it made him lethargic as well.   Latex Itching   Penicillins Hives    Did it involve swelling of the face/tongue/throat, SOB, or low BP? Unknown Did it involve sudden or severe rash/hives, skin peeling, or any reaction on the inside of your mouth or nose? Unknown Did you need to seek medical attention at a hospital or doctor's office? yes When did it last happen?      Childhood If all above answers are "NO", may proceed with cephalosporin use.    Antimicrobials this admission: Dalbavancin  11/29 x1  Dose adjustments this admission: N/A  Microbiology results: 11/29 BCx: collected, pending 11/29 L toe wound cx: collected, pending  Thank you for allowing pharmacy to be a part of this patient's care.  Luisa Hart, PharmD, BCPS Clinical Pharmacist 01/08/2022 6:15 PM   Please refer to [facility] for pharmacy phone number

## 2022-01-08 NOTE — Discharge Instructions (Signed)
Your work-up today was overall reassuring.  You received a dose of Dalvance.  This is a long-acting IV antibiotic that is a one-time dose.  He will follow-up with infectious disease.  They will call you to schedule this appointment.  I also gave you a podiatry follow-up.  Please call this office to schedule an appointment tomorrow.  Please also reach out to your primary care provider to schedule a outpatient MRI of the left great toe to evaluate for osteomyelitis while you wait to be seen by podiatry.  If you have fever, worsening of the redness or other symptoms please return to the emergency room immediately.

## 2022-01-08 NOTE — Progress Notes (Signed)
   Subjective:    Patient ID: Juan Yang, male    DOB: Aug 06, 1955, 66 y.o.   MRN: [id number]   Chief Complaint: Cellulitis (Left great toe- states got worse after finishing abx)   Pt comes in today for toe infection that is worse; completed 10-day course of Bactrim DS 01/06/22; no fever/chills; states there was initial improvement but has gotten worse over the last few days. See note below.  Wound Check Treated in ED: treated in office on 12/27/21. Previous treatment included oral antibiotics. Maximum temperature: afebrile. There has been no drainage from the wound. The redness has worsened. The swelling has worsened. There is new pain (pt has neuropathy, but states he can now feel pain in toe) present. Difficulty Moving Extremity/Digit: due to swelling.       Review of Systems  Constitutional:  Negative for chills and fever.  Respiratory:  Negative for chest tightness and shortness of breath.   Cardiovascular:  Negative for chest pain.  Skin:  Positive for wound.       Ongoing cellulitis of L great toe  All other systems reviewed and are negative.      Objective:   Physical Exam Vitals and nursing note reviewed.  Constitutional:      General: He is not in acute distress.    Appearance: Normal appearance. He is not toxic-appearing.  Cardiovascular:     Rate and Rhythm: Regular rhythm. Tachycardia present.  Pulmonary:     Effort: Pulmonary effort is normal. No respiratory distress.  Skin:    Findings: Abscess and erythema present.          Comments: Abscess to anterior L great toe crusted over with surrounding erythema and swelling, warm to touch. Erythema and swelling significantly worse since visit on 01/01/22.  Neurological:     General: No focal deficit present.     Mental Status: He is alert and oriented to person, place, and time.  Psychiatric:        Mood and Affect: Mood normal.        Behavior: Behavior normal.       BP 123/85   Pulse 100   Temp  97.6 F (36.4 C) (Temporal)   Ht 6\' 1"  (1.854 m)   Wt 197 lb 9.6 oz (89.6 kg)   SpO2 97%   BMI 26.07 kg/m      Assessment & Plan:  Juan Yang was seen today for cellulitis.  Diagnoses and all orders for this visit:  Cellulitis of great toe of left foot Failed outpatient therapy. Sent to ED for evaluation and possible admission.   The above assessment and management plan was discussed with the patient. The patient verbalized understanding of and has agreed to the management plan. Patient is aware to call the clinic if they develop any new symptoms or if symptoms fail to improve or worsen. Patient is aware when to return to the clinic for a follow-up visit. Patient educated on when it is appropriate to go to the emergency department.   Collene Leyden, NP Student  I personally was present during the history, physical exam, and medical decision-making activities of this visit and have verified that the services and findings are accurately documented in the nurse practitioner student's note.  Monia Pouch, FNP-C [facility] [address] [place], [place] [address] [phone]

## 2022-01-09 ENCOUNTER — Ambulatory Visit: Payer: Medicare Other | Admitting: Emergency Medicine

## 2022-01-09 ENCOUNTER — Telehealth: Payer: Self-pay | Admitting: Family Medicine

## 2022-01-09 DIAGNOSIS — L03032 Cellulitis of left toe: Secondary | ICD-10-CM

## 2022-01-09 NOTE — Telephone Encounter (Signed)
Patient was seen on 11/29 by Rakes and was sent to the ER due to infected left big toe. Per wife, the doctor at the ER suggested that he get an MRI which needs to be ordered by Rakes. Please call back

## 2022-01-09 NOTE — Telephone Encounter (Signed)
MRI ordered per Rakes  Patient aware

## 2022-01-10 ENCOUNTER — Encounter (HOSPITAL_COMMUNITY): Payer: Medicare Other

## 2022-01-13 ENCOUNTER — Ambulatory Visit (HOSPITAL_COMMUNITY)
Admission: RE | Admit: 2022-01-13 | Discharge: 2022-01-13 | Disposition: A | Discharge status: Home / Self Care | Payer: Medicare Other | Source: Ambulatory Visit | Attending: Internal Medicine | Admitting: Internal Medicine

## 2022-01-13 ENCOUNTER — Encounter: Payer: Self-pay | Admitting: Internal Medicine

## 2022-01-13 ENCOUNTER — Telehealth: Payer: Self-pay | Admitting: *Deleted

## 2022-01-13 ENCOUNTER — Other Ambulatory Visit: Payer: Self-pay

## 2022-01-13 ENCOUNTER — Ambulatory Visit (INDEPENDENT_AMBULATORY_CARE_PROVIDER_SITE_OTHER): Payer: Medicare Other | Admitting: Internal Medicine

## 2022-01-13 VITALS — BP 118/83 | HR 93 | Temp 97.4°F | Wt 198.0 lb

## 2022-01-13 DIAGNOSIS — R600 Localized edema: Secondary | ICD-10-CM | POA: Diagnosis not present

## 2022-01-13 DIAGNOSIS — L97529 Non-pressure chronic ulcer of other part of left foot with unspecified severity: Secondary | ICD-10-CM

## 2022-01-13 DIAGNOSIS — L03032 Cellulitis of left toe: Secondary | ICD-10-CM | POA: Diagnosis not present

## 2022-01-13 DIAGNOSIS — L97509 Non-pressure chronic ulcer of other part of unspecified foot with unspecified severity: Secondary | ICD-10-CM | POA: Diagnosis not present

## 2022-01-13 DIAGNOSIS — M869 Osteomyelitis, unspecified: Secondary | ICD-10-CM | POA: Insufficient documentation

## 2022-01-13 LAB — AEROBIC/ANAEROBIC CULTURE W GRAM STAIN (SURGICAL/DEEP WOUND)
Culture: NO GROWTH
Gram Stain: NONE SEEN

## 2022-01-13 LAB — CULTURE, BLOOD (ROUTINE X 2)
Culture: NO GROWTH
Culture: NO GROWTH
Special Requests: ADEQUATE
Special Requests: ADEQUATE

## 2022-01-13 MED ORDER — GADOBUTROL 1 MMOL/ML IV SOLN
9.0000 mL | Freq: Once | INTRAVENOUS | Status: AC | PRN
  Administered 2022-01-13: 9 mL via INTRAVENOUS

## 2022-01-13 NOTE — Progress Notes (Signed)
[facility]  Reason for Consult: Cellulitis  Referring Provider: ED   HPI:    Juan Yang is a 66 y.o. male with PMHx as below who presents to the clinic for cellulitis of left great toe.   Left great toe wound with redness and swelling.  Seen in the ED 11/29 and given dose of Dalbavancin.  Had been ongoing x 2 weeks at that point with previous course of Bactrim x 10 days with initial improvement than worsening after about 5 days of Bactrim.  Prior history includes Charcot Marie disease, CKD, HTN, MGUS, recently diagnosed lung cancer, and peripheral neuropathy with decreased sensation in his feet.  No prior vascular evaluation that is available through Epic.   During ED visit he had x-ray negative for osteomyelitis.  MRI is planned for 12/21.  No prior A1c avaliable. He will be seeing podiatry 12/6 as a new patient. Since getting his infusion he reports diarrhea and fatigue.  He also reports that redness initially improved with dose of dalbavancin but now has seemed to worsen again.  Seems to worsen when he is walking more and thinks it started as a blister.  Patient's Medications  New Prescriptions   No medications on file  Previous Medications   AMLODIPINE (NORVASC) 2.5 MG TABLET    Take 2.5 mg by mouth 2 (two) times daily.   ASPIRIN EC (ADULT ASPIRIN REGIMEN) 81 MG TABLET    Take 325 mg by mouth daily.   CALCIUM CARBONATE (TUMS - DOSED IN MG ELEMENTAL CALCIUM) 500 MG CHEWABLE TABLET    Chew 2 tablets by mouth daily.   CLOBETASOL CREAM (TEMOVATE) 0.05 %    Apply 1 application topically 2 (two) times daily as needed (rash).   CYCLOBENZAPRINE (FLEXERIL) 10 MG TABLET    Take 1 tablet (10 mg total) by mouth 3 (three) times daily as needed for muscle spasms.   FUROSEMIDE (LASIX) 40 MG TABLET    Take 1 tablet (40 mg total) by mouth daily.   GLUCOSAMINE HCL-MSM (GLUCOSAMINE-MSM PO)    Take 1 tablet by mouth daily.   LOSARTAN (COZAAR) 25 MG TABLET    Take 25  mg by mouth daily.   METOPROLOL SUCCINATE (TOPROL-XL) 25 MG 24 HR TABLET    Take 1 tablet (25 mg total) by mouth daily.   ONDANSETRON (ZOFRAN) 4 MG TABLET    Take 1 tablet (4 mg total) by mouth every 8 (eight) hours as needed for nausea or vomiting.   OXYCODONE (OXY IR/ROXICODONE) 5 MG IMMEDIATE RELEASE TABLET    Take 5 mg by mouth every 4 (four) hours as needed for severe pain.   OXYCODONE (OXY IR/ROXICODONE) 5 MG IMMEDIATE RELEASE TABLET    Take 1-2 tablets (5-10 mg total) by mouth every 4 (four) hours as needed for breakthrough pain or severe pain.   POTASSIUM 99 MG TABS    Take 99 mg by mouth daily as needed (low potassium).   ROSUVASTATIN (CRESTOR) 5 MG TABLET    Take 1 tablet (5 mg total) by mouth daily.   SILDENAFIL (VIAGRA) 100 MG TABLET    Take 100 mg by mouth daily as needed for erectile dysfunction.   SODIUM BICARBONATE 650 MG TABLET    Take 650 mg by mouth 2 (two) times daily.  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   History of kidney stones    Hypertension    Peripheral neuropathy 10/29/2016   Pneumonia    Transient cerebral ischemic attack, unspecified 05/10/2020   stroke    Social History   Tobacco Use   Smoking status: Former    Packs/day: 1.50    Years: 20.00    Total pack years: 30.00    Types: Cigarettes    Quit date: 11/12/2012    Years since quitting: 9.1    Passive exposure: Never   Smokeless tobacco: Never   Tobacco comments:    Pt smokes 1  pack a month and vapes CBD oil. ARJ 11/22/21  Vaping Use   Vaping Use: Former   Quit date: 12/18/2021   Substances: CBD  Substance Use Topics   Alcohol use: No   Drug use: No    Family History  Problem Relation Age of Onset   Charcot-Marie-Tooth disease Mother    Heart disease Father    COPD Father    Arthritis Sister     Allergies  Allergen Reactions   Other Hives    DUCK EGGS ONLY   Atenolol Diarrhea and Nausea Only    Pt unsure of allergy? Possible fainting?   Cymbalta [Duloxetine Hcl] Other (See Comments)    Pt states it shut his bladder down and could not urinate; states it made him lethargic as well.   Latex Itching   Penicillins Hives    Did it involve swelling of the face/tongue/throat, SOB, or low BP? Unknown Did it involve sudden or severe rash/hives, skin peeling, or any reaction on the inside of your mouth or nose? Unknown Did you need to seek medical attention at a hospital or doctor's office? yes When did it last happen?      Childhood If all above answers are "NO", may proceed with cephalosporin use.    Review of Systems  All other systems reviewed and are negative.  Except as noted above.    OBJECTIVE:    Vitals:   01/13/22 0950  BP: 118/83  Pulse: 93  Temp: (!) 97.4 F (36.3 C)  TempSrc: Oral  SpO2: 96%  Weight: 198 lb (89.8 kg)     Body mass index is 26.85 kg/m.  Physical Exam Constitutional:      General: He is not in acute distress. HENT:     Head: Normocephalic and atraumatic.  Eyes:     Extraocular Movements: Extraocular movements intact.     Conjunctiva/sclera: Conjunctivae normal.  Cardiovascular:     Pulses: Normal pulses.  Pulmonary:     Effort: Pulmonary effort is normal. No respiratory distress.  Abdominal:     General: There is no distension.     Palpations: Abdomen is soft.  Musculoskeletal:     Comments: See picture.   Skin:    General: Skin is warm and dry.  Neurological:     General: No focal deficit present.      Mental Status: He  is alert and oriented to person, place, and time.  Psychiatric:        Mood and Affect: Mood normal.        Behavior: Behavior normal.      Labs and Microbiology:     Latest Ref Rng & Units 01/08/2022    4:26 PM 12/15/2021    3:41 AM 12/14/2021    3:52 AM  CBC  WBC 4.0 - 10.5 K/uL 8.9  17.9  10.7   Hemoglobin 13.0 - 17.0 g/dL 12.6  12.6  12.9   Hematocrit 39.0 - 52.0 % 37.9  37.5  37.7   Platelets 150 - 400 K/uL 275  307  246       Latest Ref Rng & Units 01/08/2022    4:26 PM 12/15/2021    3:41 AM 12/14/2021    3:52 AM  CMP  Glucose 70 - 99 mg/dL 90  98  166   BUN 8 - 23 mg/dL _0 Creatinine 0.61 - 1.24 mg/dL 1.40  1.09  1.07   Sodium 135 - 145 mmol/L 135  138  138   Potassium 3.5 - 5.1 mmol/L 3.8  3.8  3.9   Chloride 98 - 111 mmol/L 102  106  108   CO2 22 - 32 mmol/L _1 Calcium 8.9 - 10.3 mg/dL 9.0  8.7  8.8   Total Protein 6.5 - 8.1 g/dL  7.2  7.1   Total Bilirubin 0.3 - 1.2 mg/dL  0.5  0.7   Alkaline Phos 38 - 126 U/L  89  93   AST 15 - 41 U/L  33  49   ALT 0 - 44 U/L  33  35      Recent Results (from the past 240 hour(s))  Blood culture (routine x 2)     Status: None   Collection Time: 01/08/22  4:26 PM   Specimen: Right Antecubital; Blood  Result Value Ref Range Status   Specimen Description RIGHT ANTECUBITAL  Final   Special Requests   Final    BOTTLES DRAWN AEROBIC AND ANAEROBIC Blood Culture adequate volume   Culture   Final    NO GROWTH 5 DAYS Performed at [facility], [address] [place], [place] [address]    Report Status 01/13/2022 FINAL  Final  Blood culture (routine x 2)     Status: None   Collection Time: 01/08/22  4:26 PM   Specimen: Left Antecubital; Blood  Result Value Ref Range Status   Specimen Description LEFT ANTECUBITAL  Final   Special Requests   Final    BOTTLES DRAWN AEROBIC AND ANAEROBIC Blood Culture adequate volume   Culture   Final    NO GROWTH 5 DAYS Performed at [facility], [address] [place], [place] [address]    Report Status 01/13/2022 FINAL  Final  Aerobic/Anaerobic Culture w Gram Stain (surgical/deep wound)     Status: None (Preliminary result)   Collection Time: 01/08/22  6:00 PM   Specimen: Toe  Result Value Ref Range Status   Specimen Description   Final    TOE LEFT Performed at [facility], [address], [place], [place] [address]    Special Requests   Final    NONE Performed at [facility], [address], [place], [place] [address]    Gram Stain NO WBC SEEN NO ORGANISMS SEEN   Final   Culture   Final  NO GROWTH 4 DAYS NO ANAEROBES ISOLATED; CULTURE IN PROGRESS FOR 5 DAYS Performed at [facility], [place] [address] [place], [place] [address]    Report Status PENDING  Incomplete      ASSESSMENT & PLAN:    Ulcer of left foot ([place]) Patient has a wound on left great toe that has not improved with 10 days of Bactrim and now received a dose of Dalbavancin 11/29 with no significant improvement.  This antibiotic dose will provide approximately 2 weeks of IV antibiotic coverage through 12/13.  I think this is clinically concerning for osteomyelitis in which case he would benefit from debridement and preferably amputation of necrotic bone to adequately deal with this infection.  He has an appointment with podiatry scheduled for 12/6 which he needs to keep.  He has risk factors for PAD and advanced 3-vessel CAD noted on recent CT chest.  Pulses appear palpable on exam, but will check ABI's and try to expedite MRI which is currently scheduled in almost 3 weeks.  Based on recent labs, his creatinine clearance is approximately 66.  Check ESR, CRP.  Follow up in 1 weeks.   Orders Placed This Encounter  Procedures   MR TOES LEFT W WO CONTRAST    Standing Status:   Future    Standing Expiration Date:   01/14/2023    Order Specific Question:   If indicated for the ordered procedure, I authorize the administration of contrast media per  Radiology protocol    Answer:   Yes    Order Specific Question:   What is the patient's sedation requirement?    Answer:   No Sedation    Order Specific Question:   Does the patient have a pacemaker or implanted devices?    Answer:   No    Order Specific Question:   Preferred imaging location?    Answer:   [facility] (table limit - 500 lbs)   Sedimentation rate   C-reactive protein          [facility] 01/13/2022, 10:18 AM   I have personally spent 45 minutes involved in face-to-face and non-face-to-face activities for this patient on the day of the visit. Professional time spent includes the following activities: Preparing to see the patient (review of tests), Obtaining and/or reviewing separately obtained history (admission/discharge record), Performing a medically appropriate examination and/or evaluation , Ordering medications/tests/procedures, referring and communicating with other health care professionals, Documenting clinical information in the EMR, Independently interpreting results (not separately reported), Communicating results to the patient/family/caregiver, Counseling and educating the patient/family/caregiver and Care coordination (not separately reported).

## 2022-01-13 NOTE — Assessment & Plan Note (Addendum)
Patient has a wound on left great toe that has not improved with 10 days of Bactrim and now received a dose of Dalbavancin 11/29 with no significant improvement.  This antibiotic dose will provide approximately 2 weeks of IV antibiotic coverage through 12/13.  I think this is clinically concerning for osteomyelitis in which case he would benefit from debridement and preferably amputation of necrotic bone to adequately deal with this infection.  He has an appointment with podiatry scheduled for 12/6 which he needs to keep.  He has risk factors for PAD and advanced 3-vessel CAD noted on recent CT chest.  Pulses appear palpable on exam, but will check ABI's and try to expedite MRI which is currently scheduled in almost 3 weeks.  Based on recent labs, his creatinine clearance is approximately 66.  Check ESR, CRP.  Follow up in 1 weeks.

## 2022-01-13 NOTE — Telephone Encounter (Signed)
        Patient  visited [facility] on 01/09/2022  for treatment   Telephone encounter attempt :  1st  A HIPAA compliant voice message was left requesting a return call.  Instructed patient to call back at [phone]. [place] [phone] [address] , [place] [address] Email : [email or web address]

## 2022-01-14 ENCOUNTER — Telehealth: Payer: Self-pay

## 2022-01-14 LAB — C-REACTIVE PROTEIN: CRP: 6.8 mg/L (ref <8.0)

## 2022-01-14 LAB — SEDIMENTATION RATE: Sed Rate: 33 mm/h — ABNORMAL HIGH (ref 0–20)

## 2022-01-14 NOTE — Telephone Encounter (Signed)
-----   Message from Mignon Pine, DO sent at 01/14/2022  7:43 AM EST ----- Can you please update patient that MRI does show concern for possible early osteomyelitis.  I have messaged the podiatrist he sees tomorrow to make him aware and get input.  He should certainly keep that appointment and I'll reach out once hearing back from Dr Amalia Hailey.

## 2022-01-14 NOTE — Telephone Encounter (Signed)
Unable to reach patient to relay message below, left voicemail to contact office.

## 2022-01-14 NOTE — Telephone Encounter (Signed)
Patient returned phone call and I advised him of MRI results and to keep his appointment with Dr. Amalia Hailey. [name] T Brooks Sailors

## 2022-01-15 ENCOUNTER — Ambulatory Visit: Payer: Medicare Other | Admitting: Hematology

## 2022-01-15 ENCOUNTER — Ambulatory Visit: Payer: Medicare Other | Admitting: Podiatry

## 2022-01-15 ENCOUNTER — Encounter: Payer: Self-pay | Admitting: Podiatry

## 2022-01-15 VITALS — BP 147/98 | HR 114

## 2022-01-15 DIAGNOSIS — L97522 Non-pressure chronic ulcer of other part of left foot with fat layer exposed: Secondary | ICD-10-CM | POA: Diagnosis not present

## 2022-01-15 DIAGNOSIS — M869 Osteomyelitis, unspecified: Secondary | ICD-10-CM

## 2022-01-15 NOTE — Progress Notes (Signed)
Chief Complaint  Patient presents with   Nail Problem    Patient is here for left great toe infection, he has taken antibiotic prescribed by pcp.    HPI: 66 y.o. male not diabetic, PMHx CKD stage III, Charcot-Marie-Tooth and multiple other comorbidities presenting today as a referral from [facility] emergency department for evaluation of an ulcer to the dorsum of the left second toe.  Patient was recently seen in the emergency department at [facility] and dalbavancin given.  Also has been on oral antibiotic Bactrim.  Recently seen by infectious disease, Dr. Jule Ser, [facility] on 01/13/2022.  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   History of kidney stones    Hypertension    Peripheral neuropathy 10/29/2016   Pneumonia    Transient cerebral ischemic attack, unspecified 05/10/2020   stroke    Past Surgical History:  Procedure Laterality Date   BRONCHIAL NEEDLE ASPIRATION BIOPSY  11/29/2021   Procedure: BRONCHIAL NEEDLE ASPIRATION BIOPSIES;  Surgeon: Garner Nash, DO;  Location: [facility];  Service: Pulmonary;;   CHOLECYSTECTOMY N/A 12/13/2021   Procedure: LAPAROSCOPIC CHOLECYSTECTOMY;  Surgeon: Virl Cagey, MD;  Location: [facility];  Service: General;  Laterality: N/A;   COLONOSCOPY WITH PROPOFOL N/A 11/14/2016   Procedure: COLONOSCOPY WITH PROPOFOL;  Surgeon: Rogene Houston, MD;  [facility];  Service: Endoscopy;  Laterality: N/A;  9:15   IR IMAGING GUIDED PORT INSERTION  12/24/2021   LUMBAR  LAMINECTOMY/DECOMPRESSION MICRODISCECTOMY Right 05/24/2019   Procedure: LAMINECTOMY AND FORAMINOTOMY LUMBAR FIVE- SACRAL ONE RIGHT;  Surgeon: Vallarie Mare, MD;  Location: [facility];  Service: Neurosurgery;  Laterality: Right;  posterior/right   LUNG BIOPSY     MOHS SURGERY Right 05/2020   ear   MOHS SURGERY     POLYPECTOMY  11/14/2016   Procedure: POLYPECTOMY- SIGMOID COLON X3 TRANSVERSE COLON X2;  Surgeon: Rogene Houston, MD;  [facility];  Service: Endoscopy;;   rt ankle surgery     Titanium hinge   TONSILLECTOMY     TRANSFORAMINAL LUMBAR INTERBODY FUSION (TLIF) WITH PEDICLE SCREW FIXATION 1 LEVEL N/A 05/02/2020   Procedure: Lumbar five Sacral one Transforaminal lumbar interbody fusion;  Surgeon: Vallarie Mare, MD;  Location: [facility];  Service: Neurosurgery;  Laterality: N/A;   VIDEO BRONCHOSCOPY WITH ENDOBRONCHIAL ULTRASOUND N/A 11/29/2021   Procedure: VIDEO BRONCHOSCOPY WITH ENDOBRONCHIAL ULTRASOUND;  Surgeon: Garner Nash, DO;  Location: [facility];  Service: Pulmonary;  Laterality: N/A;    Allergies  Allergen Reactions   Other Hives    DUCK EGGS ONLY   Atenolol Diarrhea and Nausea Only    Pt unsure of allergy? Possible fainting?   Cymbalta [Duloxetine Hcl] Other (See Comments)    Pt states it shut his bladder down and could not urinate; states it made him lethargic as well.   Latex Itching   Penicillins Hives  Did it involve swelling of the face/tongue/throat, SOB, or low BP? Unknown Did it involve sudden or severe rash/hives, skin peeling, or any reaction on the inside of your mouth or nose? Unknown Did you need to seek medical attention at a hospital or doctor's office? yes When did it last happen?      Childhood If all above answers are "NO", may proceed with cephalosporin use.     LT foot 01/13/2022  Physical Exam: General: The patient is alert and oriented x3 in no acute distress.  Dermatology: Ulcer noted dorsal aspect of the IPJ left  hallux with surrounding edema and erythema.  No appreciable drainage.  The erythema appears localized around the IPJ and great toe  Vascular: Pulses are palpable.  Capillary refill WNL.  Skin is warm to touch.  Clinically there is no concern for vascular compromise however ABIs were ordered 01/13/2022 which will be good to establish baseline vascular status.  Neurological: Absent via light touch  Musculoskeletal Exam: Charcot-Marie-Tooth.  Contracture at the IPJ of the left hallux  Radiographic Exam LT great toe 01/01/2022:  FINDINGS: There is no evidence of fracture or dislocation. There is no evidence of arthropathy or other focal bone abnormality. No bony destructive processes identified. Soft tissues are unremarkable. There is a hallux valgus.   IMPRESSION: Hallux valgus.  No acute osseous abnormalities.  MR TOES LEFT W WO CONTRAST 01/13/2022 IMPRESSION: Marrow signal abnormality in the mid to distal aspect of the great toe proximal phalanx with adjacent soft tissue ulcer, concerning for early osteomyelitis. Great toe cellulitis without evidence of soft tissue abscess.   Assessment: 1.  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 Patient evaluated. X-Rays and MRI reviewed.  2.  Discussed with the patient different treatment options both conservative/salvage versus amputation of the toe.  I do believe the patient would benefit from amputation, or at least partial amputation of the left hallux.  If he would like to pursue limb salvage recommend bone biopsy to assist infectious disease with bone cultures to narrow the antibiotics that he would need 3.  After a long discussion of the risk benefits advantages and disadvantages of each modality, the patient would like to discuss with his wife and think about it.  They will plan to return to clinic Monday, 01/20/2022 to determine a plan of care and plan of action for the patient. 4.  There is other concern as  well since the patient states that he will be requiring chemotherapy in the near future and this active infection could delay that chemotherapy treatment.  I do believe that would be in the patient's best interest to proceed with amputation of the toe and surgical cure of the osteomyelitis versus long-term antibiotic regimen      Edrick Kins, DPM [facility]  Dr. Edrick Kins, DPM    [address], [place] [address]                Office [phone]  Fax [phone]

## 2022-01-20 ENCOUNTER — Ambulatory Visit: Payer: Medicare Other

## 2022-01-20 ENCOUNTER — Ambulatory Visit: Payer: Medicare Other | Admitting: Podiatry

## 2022-01-21 ENCOUNTER — Ambulatory Visit: Payer: Medicare Other | Admitting: Internal Medicine

## 2022-01-21 NOTE — Progress Notes (Deleted)
[facility]  CHIEF COMPLAINT:    Follow up for osteomyelitis  SUBJECTIVE:    Juan Yang is a 66 y.o. male with PMHx as below who presents to the clinic for osteomyelitis.   Patient here for follow up after initial visit on 01/13/22.  He had an MRI 01/13/22 which showed:  Marrow signal abnormality in the mid to distal aspect of the great toe proximal phalanx with adjacent soft tissue ulcer, concerning for early osteomyelitis. Great toe cellulitis without evidence of soft tissue abscess.  Patient was seen by Dr Amalia Hailey with podiatry on 01/15/22.  He agreed that pulses were palpable and clinically there was no concern for vascular compromise.  Felt that patient would benefit from amputation of the left hallux.  He was scheduled for follow up with Dr Amalia Hailey yesterday but appointment was cancelled and rescheduled for later this week on 01/24/22.  Patient reports today that he ***.    Please see A&P for the details of today's visit and status of the patient's medical problems.   Patient's Medications  New Prescriptions   No medications on file  Previous Medications   AMLODIPINE (NORVASC) 2.5 MG TABLET    Take 2.5 mg by mouth 2 (two) times daily.   ASPIRIN EC (ADULT ASPIRIN REGIMEN) 81 MG TABLET    Take 325 mg by mouth daily.   CALCIUM CARBONATE (TUMS - DOSED IN MG ELEMENTAL CALCIUM) 500 MG CHEWABLE TABLET    Chew 2 tablets by mouth daily.   CLOBETASOL CREAM (TEMOVATE) 0.05 %    Apply 1 application topically 2 (two) times daily as needed (rash).   CYCLOBENZAPRINE (FLEXERIL) 10 MG TABLET    Take 1 tablet (10 mg total) by mouth 3 (three) times daily as needed for muscle spasms.   FUROSEMIDE (LASIX) 40 MG TABLET    Take 1 tablet (40 mg total) by mouth daily.   GLUCOSAMINE HCL-MSM (GLUCOSAMINE-MSM PO)    Take 1 tablet by mouth daily.   LOSARTAN (COZAAR) 25 MG TABLET    Take 25 mg by mouth daily.   METOPROLOL SUCCINATE (TOPROL-XL) 25 MG 24 HR TABLET     Take 1 tablet (25 mg total) by mouth daily.   ONDANSETRON (ZOFRAN) 4 MG TABLET    Take 1 tablet (4 mg total) by mouth every 8 (eight) hours as needed for nausea or vomiting.   OXYCODONE (OXY IR/ROXICODONE) 5 MG IMMEDIATE RELEASE TABLET    Take 1-2 tablets (5-10 mg total) by mouth every 4 (four) hours as needed for breakthrough pain or severe pain.   POTASSIUM 99 MG TABS    Take 99 mg by mouth daily as needed (low potassium).   ROSUVASTATIN (CRESTOR) 5 MG TABLET    Take 1 tablet (5 mg total) by mouth daily.   SILDENAFIL (VIAGRA) 100 MG TABLET    Take 100 mg by mouth daily as needed for erectile dysfunction.   SODIUM BICARBONATE 650 MG TABLET    Take 650 mg by mouth 2 (two) times daily.  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   History of kidney stones    Hypertension    Peripheral neuropathy 10/29/2016   Pneumonia    Transient cerebral ischemic attack, unspecified 05/10/2020   stroke    Social History   Tobacco Use   Smoking status: Former    Packs/day: 1.50    Years: 20.00    Total pack years: 30.00    Types: Cigarettes    Quit date: 11/12/2012    Years since quitting: 9.1    Passive exposure: Never   Smokeless tobacco: Never   Tobacco comments:    Pt smokes 1 pack a month and vapes CBD oil. ARJ 11/22/21  Vaping Use   Vaping Use: Former   Quit date: 12/18/2021   Substances: CBD  Substance Use Topics   Alcohol use: No   Drug use: No    Family History   Problem Relation Age of Onset   Charcot-Marie-Tooth disease Mother    Heart disease Father    COPD Father    Arthritis Sister     Allergies  Allergen Reactions   Other Hives    DUCK EGGS ONLY   Atenolol Diarrhea and Nausea Only    Pt unsure of allergy? Possible fainting?   Cymbalta [Duloxetine Hcl] Other (See Comments)    Pt states it shut his bladder down and could not urinate; states it made him lethargic as well.   Latex Itching   Penicillins Hives    Did it involve swelling of the face/tongue/throat, SOB, or low BP? Unknown Did it involve sudden or severe rash/hives, skin peeling, or any reaction on the inside of your mouth or nose? Unknown Did you need to seek medical attention at a hospital or doctor's office? yes When did it last happen?      Childhood If all above answers are "NO", may proceed with cephalosporin use.    ROS   OBJECTIVE:    There were no vitals filed for this visit. There is no height or weight on file to calculate BMI.  Physical Exam   Labs and Microbiology:    Latest Ref Rng & Units 01/08/2022    4:26 PM 12/15/2021    3:41 AM 12/14/2021    3:52 AM  CBC  WBC 4.0 - 10.5 K/uL 8.9  17.9  10.7   Hemoglobin 13.0 - 17.0 g/dL 12.6  12.6  12.9   Hematocrit 39.0 - 52.0 % 37.9  37.5  37.7   Platelets 150 - 400 K/uL 275  307  246       Latest Ref Rng & Units 01/08/2022    4:26 PM 12/15/2021    3:41 AM 12/14/2021    3:52 AM  CMP  Glucose 70 - 99 mg/dL 90  98  166   BUN 8 - 23 mg/dL 14  13  10    Creatinine 0.61 - 1.24 mg/dL 1.40  1.09  1.07   Sodium 135 - 145 mmol/L 135  138  138   Potassium 3.5 - 5.1 mmol/L 3.8  3.8  3.9   Chloride 98 - 111 mmol/L 102  106  108   CO2 22 - 32 mmol/L 23  21  22    Calcium 8.9 - 10.3 mg/dL 9.0  8.7  8.8   Total Protein 6.5 - 8.1 g/dL  7.2  7.1   Total Bilirubin 0.3 - 1.2 mg/dL  0.5  0.7   Alkaline Phos 38 - 126 U/L  89  93   AST 15 - 41 U/L  33  49   ALT 0 - 44 U/L  33  35      No results found for this or  any previous visit (from the past 240 hour(s)).  Imaging: ***   ASSESSMENT & PLAN:    No problem-specific Assessment & Plan notes found for this encounter.   No orders of the defined types were placed in this encounter.    There are no diagnoses linked to this encounter.  ***   Raynelle Highland for Infectious [facility] 01/21/2022, 5:17 AM

## 2022-01-24 ENCOUNTER — Ambulatory Visit: Payer: Medicare Other | Admitting: Podiatry

## 2022-01-24 DIAGNOSIS — M869 Osteomyelitis, unspecified: Secondary | ICD-10-CM

## 2022-01-24 NOTE — Progress Notes (Signed)
Chief Complaint  Patient presents with   wound care    Patient is here for great toe ulcer left foot.    HPI: 66 y.o. male not diabetic, PMHx CKD stage III, Charcot-Marie-Tooth and multiple other comorbidities presenting today for follow-up evaluation of osteomyelitis to the left great toe.  Patient also followed by infectious disease, Dr. Jule Ser, [facility] on 01/13/2022.  Currently not on antibiotics at the moment.  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   History of kidney stones    Hypertension    Peripheral neuropathy 10/29/2016   Pneumonia    Transient cerebral ischemic attack, unspecified 05/10/2020   stroke    Past Surgical History:  Procedure Laterality Date   BRONCHIAL NEEDLE ASPIRATION BIOPSY  11/29/2021   Procedure: BRONCHIAL NEEDLE ASPIRATION BIOPSIES;  Surgeon: Garner Nash, DO;  Location: [facility];  Service: Pulmonary;;   CHOLECYSTECTOMY N/A 12/13/2021   Procedure: LAPAROSCOPIC CHOLECYSTECTOMY;  Surgeon: Virl Cagey, MD;  Location: [facility];  Service: General;  Laterality: N/A;   COLONOSCOPY WITH PROPOFOL N/A 11/14/2016   Procedure: COLONOSCOPY WITH PROPOFOL;  Surgeon: Rogene Houston, MD;  Location: [facility];  Service: Endoscopy;  Laterality: N/A;  9:15   IR IMAGING GUIDED PORT INSERTION  12/24/2021   LUMBAR LAMINECTOMY/DECOMPRESSION MICRODISCECTOMY Right 05/24/2019   Procedure: LAMINECTOMY AND FORAMINOTOMY LUMBAR FIVE- SACRAL ONE RIGHT;  Surgeon: Vallarie Mare, MD;  Location: [facility];  Service:  Neurosurgery;  Laterality: Right;  posterior/right   LUNG BIOPSY     MOHS SURGERY Right 05/2020   ear   MOHS SURGERY     POLYPECTOMY  11/14/2016   Procedure: POLYPECTOMY- SIGMOID COLON X3 TRANSVERSE COLON X2;  Surgeon: Rogene Houston, MD;  Location: [facility];  Service: Endoscopy;;   rt ankle surgery     Titanium hinge   TONSILLECTOMY     TRANSFORAMINAL LUMBAR INTERBODY FUSION (TLIF) WITH PEDICLE SCREW FIXATION 1 LEVEL N/A 05/02/2020   Procedure: Lumbar five Sacral one Transforaminal lumbar interbody fusion;  Surgeon: Vallarie Mare, MD;  Location: [facility];  Service: Neurosurgery;  Laterality: N/A;   VIDEO BRONCHOSCOPY WITH ENDOBRONCHIAL ULTRASOUND N/A 11/29/2021   Procedure: VIDEO BRONCHOSCOPY WITH ENDOBRONCHIAL ULTRASOUND;  Surgeon: Garner Nash, DO;  Location: [facility];  Service: Pulmonary;  Laterality: N/A;    Allergies  Allergen Reactions   Other Hives    DUCK EGGS ONLY   Atenolol Diarrhea and Nausea Only    Pt unsure of allergy? Possible fainting?   Cymbalta [Duloxetine Hcl] Other (See Comments)    Pt states it shut his bladder down and could not urinate; states it made him lethargic as well.   Latex Itching   Penicillins Hives    Did it involve swelling of the face/tongue/throat, SOB, or low BP? Unknown Did it involve sudden or severe rash/hives, skin peeling, or any reaction on the inside of your  mouth or nose? Unknown Did you need to seek medical attention at a hospital or doctor's office? yes When did it last happen?      Childhood If all above answers are "NO", may proceed with cephalosporin use.     LT foot 01/13/2022  Physical Exam: General: The patient is alert and oriented x3 in no acute distress.  Dermatology: Ulcer noted dorsal aspect of the IPJ left hallux with surrounding edema and erythema.  No appreciable drainage.  The erythema appears localized around the IPJ and great toe  Vascular: Pulses are palpable.  Capillary refill WNL.  Skin is  warm to touch.  Clinically there is no concern for vascular compromise however ABIs were ordered 01/13/2022 which will be good to establish baseline vascular status.  Neurological: Absent via light touch  Musculoskeletal Exam: Charcot-Marie-Tooth.  Contracture at the IPJ of the left hallux  Radiographic Exam LT great toe 01/01/2022:  FINDINGS: There is no evidence of fracture or dislocation. There is no evidence of arthropathy or other focal bone abnormality. No bony destructive processes identified. Soft tissues are unremarkable. There is a hallux valgus.   IMPRESSION: Hallux valgus.  No acute osseous abnormalities.  MR TOES LEFT W WO CONTRAST 01/13/2022 IMPRESSION: Marrow signal abnormality in the mid to distal aspect of the great toe proximal phalanx with adjacent soft tissue ulcer, concerning for early osteomyelitis. Great toe cellulitis without evidence of soft tissue abscess.   Assessment: 1.  Osteomyelitis left great toe with overlying ulcer -Patient has decided for partial toe amputation to address the osteomyelitis of the toe.  This will potentially accelerate his healing with surgical cure of the osteomyelitis so he can focus on his pulmonary lung cancer.  Risk benefits advantages and disadvantages as well as the postoperative recovery course were explained again today. -Will notify infectious disease, Dr. Jule Ser, [facility], which is also following the patient closely and may need to be monitored postoperatively as well.  Always appreciate their input -Authorization for surgery was initiated today.  Surgery will consist of partial toe amputation left great toe -Return to clinic 1 week postop      Edrick Kins, DPM [facility]  Dr. Edrick Kins, DPM    [address], [place] [address]                Office [phone]  Fax [phone]

## 2022-01-27 ENCOUNTER — Telehealth: Payer: Self-pay

## 2022-01-27 ENCOUNTER — Telehealth: Payer: Self-pay | Admitting: Podiatry

## 2022-01-27 NOTE — Telephone Encounter (Signed)
-----  Message from Mignon Pine, DO sent at 01/27/2022  9:08 AM EST ----- Regarding: FW: Scheduled for surgery/amputation Hi team,  Can we reach out to patient and see about rescheduling his missed appointment last week?  Can be a phone visit with me on Wednesday if I have space.  Otherwise, any provider that has 30 min opening.  Thanks, Mitzi Hansen ----- Message ----- From: Edrick Kins, DPM Sent: 01/24/2022  11:47 AM EST To: Mignon Pine, DO Subject: Scheduled for surgery/amputation               Arbie Cookey, finally met again with Mr. Attwood.  He has decided for toe amputation.  I am out of town next week but will schedule it as soon as possible.  Stanton Kidney Christmas and happy holidays! Hope you're doing well! - Ruby Cola

## 2022-01-27 NOTE — Telephone Encounter (Signed)
Called patient to reschedule, no answer. Left HIPAA compliant voicemail requesting callback.   Will also try through [facility].   Beryle Flock, RN

## 2022-01-27 NOTE — Telephone Encounter (Signed)
I'm calling to schedule surgery with dr. Amalia Hailey for my husband. Dr. Amalia Hailey is going to perform a partial toe amputation on my husband's Left hallux toe. We need to get this scheduled ASAP because he also has lung cancer and we need to get those treatments started. My number is [phone].

## 2022-01-29 ENCOUNTER — Encounter: Payer: Self-pay | Admitting: Internal Medicine

## 2022-01-29 ENCOUNTER — Telehealth: Payer: Self-pay | Admitting: Podiatry

## 2022-01-29 ENCOUNTER — Other Ambulatory Visit: Payer: Self-pay

## 2022-01-29 ENCOUNTER — Ambulatory Visit (INDEPENDENT_AMBULATORY_CARE_PROVIDER_SITE_OTHER): Payer: Medicare Other | Admitting: Internal Medicine

## 2022-01-29 DIAGNOSIS — M86472 Chronic osteomyelitis with draining sinus, left ankle and foot: Secondary | ICD-10-CM | POA: Diagnosis not present

## 2022-01-29 NOTE — Assessment & Plan Note (Signed)
Discussed MRI findings with patient and agree that amputation is the most effective way to try and cure this infection of left great toe complicated by OM.  I recommend that he continue to hold antibiotics since his surgery will be within 1 week as this will help increase any culture yield and he is not systemically ill.    In the setting of minor amputation, I would favor continuing post-operative antibiotics for approximately 2 weeks, possibly longer depending on his wound healing.  We discussed IV vs PO therapy and I would feel comfortable with PO antibiotics given emerging data suggesting its equivalency and his planned amputation.  He would also prefer PO over IV therapy to avoid PICC line and hospitalization as well.    We also discussed his PCN allergy which he describes as a childhood rash with no SOB/anaphylaxis.  Will send in Doxycycline 100mg  BID and Augmentin 875mg  BID x 2 weeks based on his description.  He is also comfortable with trial of PCN therapy again.  I will follow up with him on 02/20/22 to determine if antibiotic extension will be indicated.

## 2022-01-29 NOTE — Telephone Encounter (Signed)
DOS: 02/04/2022  [employer] Medicare  Amputation Toe MPJ Joint Hallux Lt (10175)  Deductible: $0 Out-of-Pocket: $3,200 with $930.10 remaining CoInsurance: $3,200 with $930.10 remaining  Prior authorization is not required per [place] B.  Call Reference #: [id number]

## 2022-01-29 NOTE — Progress Notes (Signed)
[facility]ious Disease  CHIEF COMPLAINT:    Follow up for left toe osteomyelitis  SUBJECTIVE:    Juan Yang is a 66 y.o. male with PMHx as below who presents to the clinic for left toe osteomyelitis.   Patient presents virtually today to discuss results of MRI that showed:  IMPRESSION: Marrow signal abnormality in the mid to distal aspect of the great toe proximal phalanx with adjacent soft tissue ulcer, concerning for early osteomyelitis. Great toe cellulitis without evidence of soft tissue abscess.  He has seen Dr Amalia Hailey with podiatry whom has also recommended amputation to deal with this infection and patient is agreeable to this.  He is scheduled for 02/04/22 from what he tells me today.    He is no longer on antibiotics after having received a dose of Dalbavancin in the ED on 01/08/22.  His ABI's have not yet been scheduled but clinically it is felt that he has adequate blood flow to heal.  He has no systemic symptoms.  His only notable issue is the red and swollen toe that is stable.  Please see A&P for the details of today's visit and status of the patient's medical problems.   Patient's Medications  New Prescriptions   No medications on file  Previous Medications   AMLODIPINE (NORVASC) 2.5 MG TABLET    Take 2.5 mg by mouth 2 (two) times daily.   ASPIRIN EC (ADULT ASPIRIN REGIMEN) 81 MG TABLET    Take 325 mg by mouth daily.   CALCIUM CARBONATE (TUMS - DOSED IN MG ELEMENTAL CALCIUM) 500 MG CHEWABLE TABLET    Chew 2 tablets by mouth daily.   CLOBETASOL CREAM (TEMOVATE) 0.05 %    Apply 1 application topically 2 (two) times daily as needed (rash).   CYCLOBENZAPRINE (FLEXERIL) 10 MG TABLET    Take 1 tablet (10 mg total) by mouth 3 (three) times daily as needed for muscle spasms.   FUROSEMIDE (LASIX) 40 MG TABLET    Take 1 tablet (40 mg total) by mouth daily.   GLUCOSAMINE HCL-MSM (GLUCOSAMINE-MSM PO)    Take 1 tablet by mouth daily.   LOSARTAN  (COZAAR) 25 MG TABLET    Take 25 mg by mouth daily.   METOPROLOL SUCCINATE (TOPROL-XL) 25 MG 24 HR TABLET    Take 1 tablet (25 mg total) by mouth daily.   ONDANSETRON (ZOFRAN) 4 MG TABLET    Take 1 tablet (4 mg total) by mouth every 8 (eight) hours as needed for nausea or vomiting.   OXYCODONE (OXY IR/ROXICODONE) 5 MG IMMEDIATE RELEASE TABLET    Take 1-2 tablets (5-10 mg total) by mouth every 4 (four) hours as needed for breakthrough pain or severe pain.   POTASSIUM 99 MG TABS    Take 99 mg by mouth daily as needed (low potassium).   ROSUVASTATIN (CRESTOR) 5 MG TABLET    Take 1 tablet (5 mg total) by mouth daily.   SILDENAFIL (VIAGRA) 100 MG TABLET    Take 100 mg by mouth daily as needed for erectile dysfunction.   SODIUM BICARBONATE 650 MG TABLET    Take 650 mg by mouth 2 (two) times daily.  Modified Medications   No medications on file  Discontinued Medications   No medications on file      Past Medical History:  Diagnosis Date   AAA (abdominal aortic aneurysm) (HCC)    3.2 cm 02/01/18 CT, recommended [place] in 3 years  Anxiety    Arthritis    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   History of kidney stones    Hypertension    Peripheral neuropathy 10/29/2016   Pneumonia    Transient cerebral ischemic attack, unspecified 05/10/2020   stroke    Social History   Tobacco Use   Smoking status: Former    Packs/day: 1.50    Years: 20.00    Total pack years: 30.00    Types: Cigarettes    Quit date: 11/12/2012    Years since quitting: 9.2    Passive exposure: Never   Smokeless tobacco: Never   Tobacco comments:    Pt smokes 1 pack a month and vapes CBD oil. ARJ 11/22/21  Vaping Use   Vaping Use: Former   Quit date:  12/18/2021   Substances: CBD  Substance Use Topics   Alcohol use: No   Drug use: No    Family History  Problem Relation Age of Onset   Charcot-Marie-Tooth disease Mother    Heart disease Father    COPD Father    Arthritis Sister     Allergies  Allergen Reactions   Other Hives    DUCK EGGS ONLY   Atenolol Diarrhea and Nausea Only    Pt unsure of allergy? Possible fainting?   Cymbalta [Duloxetine Hcl] Other (See Comments)    Pt states it shut his bladder down and could not urinate; states it made him lethargic as well.   Latex Itching   Penicillins Hives    Did it involve swelling of the face/tongue/throat, SOB, or low BP? Unknown Did it involve sudden or severe rash/hives, skin peeling, or any reaction on the inside of your mouth or nose? Unknown Did you need to seek medical attention at a hospital or doctor's office? yes When did it last happen?      Childhood If all above answers are "NO", may proceed with cephalosporin use.     OBJECTIVE:      Labs and Microbiology:    Latest Ref Rng & Units 01/08/2022    4:26 PM 12/15/2021    3:41 AM 12/14/2021    3:52 AM  CBC  WBC 4.0 - 10.5 K/uL 8.9  17.9  10.7   Hemoglobin 13.0 - 17.0 g/dL 12.6  12.6  12.9   Hematocrit 39.0 - 52.0 % 37.9  37.5  37.7   Platelets 150 - 400 K/uL 275  307  246       Latest Ref Rng & Units 01/08/2022    4:26 PM 12/15/2021    3:41 AM 12/14/2021    3:52 AM  CMP  Glucose 70 - 99 mg/dL 90  98  166   BUN 8 - 23 mg/dL 14  13  10    Creatinine 0.61 - 1.24 mg/dL 1.40  1.09  1.07   Sodium 135 - 145 mmol/L 135  138  138   Potassium 3.5 - 5.1 mmol/L 3.8  3.8  3.9   Chloride 98 - 111 mmol/L 102  106  108   CO2 22 - 32 mmol/L 23  21  22    Calcium 8.9 - 10.3 mg/dL 9.0  8.7  8.8   Total Protein 6.5 - 8.1 g/dL  7.2  7.1   Total Bilirubin 0.3 - 1.2 mg/dL  0.5  0.7   Alkaline Phos 38 - 126 U/L  89  93   AST 15 - 41 U/L  33  49   ALT 0 - 44 U/L  33  35        ASSESSMENT & PLAN:    Osteomyelitis  (HCC) Discussed MRI findings with patient and agree that amputation is the most effective way to try and cure this infection of left great toe complicated by OM.  I recommend that he continue to hold antibiotics since his surgery will be within 1 week as this will help increase any culture yield and he is not systemically ill.    In the setting of minor amputation, I would favor continuing post-operative antibiotics for approximately 2 weeks, possibly longer depending on his wound healing.  We discussed IV vs PO therapy and I would feel comfortable with PO antibiotics given emerging data suggesting its equivalency and his planned amputation.  He would also prefer PO over IV therapy to avoid PICC line and hospitalization as well.    We also discussed his PCN allergy which he describes as a childhood rash with no SOB/anaphylaxis.  Will send in Doxycycline 100mg  BID and Augmentin 875mg  BID x 2 weeks based on his description.  He is also comfortable with trial of PCN therapy again.  I will follow up with him on 02/20/22 to determine if antibiotic extension will be indicated.      No orders of the defined types were placed in this encounter.       Raynelle Highland for Infectious Disease [facility] 01/29/2022, 1:56 PM   Virtual Visit via Telephone Note   I connected with [name] on 01/29/22 at 1:56 PM by telephone and verified that I am speaking with the correct person using two identifiers.   I discussed the limitations, risks, security and privacy concerns of performing an evaluation and management service by telephone and the availability of in person appointments. I also discussed with the patient that there may be a patient responsible charge related to this service. The patient expressed understanding and agreed to proceed.  Patient location: home My location: [facility] Duration of call: 8 min (34 min spent in chart today)

## 2022-01-30 ENCOUNTER — Ambulatory Visit (HOSPITAL_COMMUNITY): Payer: Medicare Other

## 2022-01-30 MED ORDER — AMOXICILLIN-POT CLAVULANATE 875-125 MG PO TABS: 1.0000 | ORAL_TABLET | Freq: Two times a day (BID) | ORAL | 0 refills | Status: DC

## 2022-01-30 MED ORDER — DOXYCYCLINE HYCLATE 100 MG PO TABS: 100.0000 mg | ORAL_TABLET | Freq: Two times a day (BID) | ORAL | 0 refills | Status: DC

## 2022-01-30 NOTE — Addendum Note (Signed)
Addended by: Mignon Pine on: 01/30/2022 05:48 AM   Modules accepted: Orders

## 2022-02-04 ENCOUNTER — Emergency Department (HOSPITAL_COMMUNITY)
Admission: EM | Admit: 2022-02-04 | Discharge: 2022-02-04 | Discharge status: Against Medical Advice | Payer: Medicare Other | Attending: Student | Admitting: Student

## 2022-02-04 DIAGNOSIS — Z5321 Procedure and treatment not carried out due to patient leaving prior to being seen by health care provider: Secondary | ICD-10-CM | POA: Diagnosis not present

## 2022-02-04 DIAGNOSIS — Y782 Prosthetic and other implants, materials and accessory radiological devices associated with adverse incidents: Secondary | ICD-10-CM | POA: Insufficient documentation

## 2022-02-04 DIAGNOSIS — T8189XA Other complications of procedures, not elsewhere classified, initial encounter: Secondary | ICD-10-CM | POA: Insufficient documentation

## 2022-02-04 DIAGNOSIS — M86672 Other chronic osteomyelitis, left ankle and foot: Secondary | ICD-10-CM

## 2022-02-04 MED ORDER — OXYCODONE-ACETAMINOPHEN 5-325 MG PO TABS
2.0000 | ORAL_TABLET | Freq: Once | ORAL | Status: AC
  Administered 2022-02-04: 2 via ORAL
  Filled 2022-02-04: qty 2

## 2022-02-04 NOTE — ED Triage Notes (Addendum)
Bleeding from surgical wound to L great toe after great toe amputation earlier today. Surgery performed by Dr. Amalia Hailey. Taking Augmentin and doxycycline at this time.

## 2022-02-04 NOTE — ED Provider Triage Note (Signed)
Emergency Medicine Provider Triage Evaluation Note  Juan Yang , a 66 y.o. male  was evaluated in triage.  Pt complains of bleeding left amputation.  Patient had amputation earlier today at the surgical center.  Patient states he (3:00.  Upon arriving home he noticed bleeding to the foot.  He wrapped his foot with multiple washcloths.  Over a 45-minute period the patient has had some blood soaked the second wash cloth.  Review of Systems  Positive: As above Negative: As above  Physical Exam  There were no vitals taken for this visit. Gen:   Awake, no distress   Resp:  Normal effort  MSK:   Moves extremities without difficulty  Other:    Medical Decision Making  Medically screening exam initiated at 7:00 PM.  Appropriate orders placed.  [name] was informed that the remainder of the evaluation will be completed by another provider, this initial triage assessment does not replace that evaluation, and the importance of remaining in the ED until their evaluation is complete.  Bleeding is slowed/controlled at this time.  Will leave patient's foot wrapped as is until patient is evaluated in the main ED   Dorothyann Peng, PA-C 02/04/22 1902

## 2022-02-04 NOTE — ED Notes (Signed)
Patient stated that they would be leaving. Stated "I got a hold of my doctor. I'm good."

## 2022-02-05 ENCOUNTER — Telehealth: Payer: Self-pay

## 2022-02-05 ENCOUNTER — Other Ambulatory Visit: Payer: Self-pay

## 2022-02-05 ENCOUNTER — Ambulatory Visit (INDEPENDENT_AMBULATORY_CARE_PROVIDER_SITE_OTHER): Payer: Medicare Other | Admitting: Podiatry

## 2022-02-05 DIAGNOSIS — L97522 Non-pressure chronic ulcer of other part of left foot with fat layer exposed: Secondary | ICD-10-CM

## 2022-02-05 DIAGNOSIS — C3491 Malignant neoplasm of unspecified part of right bronchus or lung: Secondary | ICD-10-CM

## 2022-02-05 NOTE — Progress Notes (Signed)
Patient seen at the office for dressing change/POV #1 DOS 02/04/2022 PARTIAL TOE AMPUTATION LT GREAT TOE. Patient denies any fever, nausea, vomiting, chills, chest pain. Pain is minimal at this time. Incision site was assessed by Dr. Amalia Hailey. Sutures are intact. Per providers orders nurse redressed foot as follows 4x4 gauze to amputation site, secured with gauze wrap, coban and stockinet. Encourage patient to stay hydrated and to elevate extremity when sitting.  All questions were answered by Dr. Amalia Hailey.   Patient was advised to contact office with any concerns or questions.

## 2022-02-05 NOTE — Progress Notes (Signed)
Order placed for PET scan per Dr. Katragadda 

## 2022-02-05 NOTE — Telephone Encounter (Signed)
Encounter created in Error

## 2022-02-05 NOTE — Telephone Encounter (Signed)
Patient's wife called office today stating last night patient started complaining of welts and itching after taking antibiotics. Has started taking benadryl to help with this. This helped with itching.  Has not taken his dose this morning. Would like to know how they should proceed.  Leatrice Jewels, RMA

## 2022-02-06 ENCOUNTER — Other Ambulatory Visit: Payer: Self-pay | Admitting: Internal Medicine

## 2022-02-06 MED ORDER — LEVOFLOXACIN 750 MG PO TABS: 750.0000 mg | ORAL_TABLET | Freq: Every day | ORAL | 0 refills | Status: DC

## 2022-02-06 NOTE — Telephone Encounter (Signed)
Spoke with patient's wife and advised they stop Augmentin and continue with doxy and leavquin.  Verbalized understanding. Will pick up prescription from pharmacy today.  States that rash has improved. Will call office back with any questions/ concerns. Leatrice Jewels, RMA

## 2022-02-11 ENCOUNTER — Telehealth: Payer: Self-pay

## 2022-02-11 ENCOUNTER — Encounter: Payer: Medicare Other | Admitting: Thoracic Surgery (Cardiothoracic Vascular Surgery)

## 2022-02-11 NOTE — Telephone Encounter (Signed)
Patient's wife called, states Juan Yang had his toe removed last Tuesday, but that she believes it looks infected and thinks it is spreading up his foot. He is still taking doxycycline and Augmentin.   She reports she does not think Juan Yang has had any fever and he denies chills. He has reported a general feeling of "not feeling good" for the past few days. She would like to know if he needs to follow up with our office or with surgeon, Dr. Amalia Yang. Will route to provider.   Beryle Flock, RN

## 2022-02-11 NOTE — Telephone Encounter (Signed)
Patient scheduled for tomorrow afternoon.   Beryle Flock, RN

## 2022-02-12 ENCOUNTER — Other Ambulatory Visit: Payer: Self-pay

## 2022-02-12 ENCOUNTER — Inpatient Hospital Stay (HOSPITAL_COMMUNITY)
Admission: EM | Admit: 2022-02-12 | Discharge: 2022-02-19 | DRG: 565 | Disposition: A | Discharge status: Home / Self Care | Payer: PPO | Attending: Internal Medicine | Admitting: Internal Medicine

## 2022-02-12 ENCOUNTER — Ambulatory Visit: Payer: Medicare Other | Admitting: Internal Medicine

## 2022-02-12 ENCOUNTER — Encounter: Payer: Self-pay | Admitting: Internal Medicine

## 2022-02-12 ENCOUNTER — Encounter: Payer: Medicare Other | Admitting: Family Medicine

## 2022-02-12 ENCOUNTER — Ambulatory Visit (INDEPENDENT_AMBULATORY_CARE_PROVIDER_SITE_OTHER): Payer: PPO | Admitting: Internal Medicine

## 2022-02-12 ENCOUNTER — Emergency Department (HOSPITAL_COMMUNITY): Payer: PPO

## 2022-02-12 ENCOUNTER — Telehealth: Payer: Self-pay

## 2022-02-12 ENCOUNTER — Emergency Department (HOSPITAL_COMMUNITY): Admission: EM | Admit: 2022-02-12 | Discharge: 2022-02-12 | Discharge status: Against Medical Advice | Payer: PPO

## 2022-02-12 VITALS — Wt 194.0 lb

## 2022-02-12 DIAGNOSIS — Z825 Family history of asthma and other chronic lower respiratory diseases: Secondary | ICD-10-CM

## 2022-02-12 DIAGNOSIS — Z87442 Personal history of urinary calculi: Secondary | ICD-10-CM

## 2022-02-12 DIAGNOSIS — N189 Chronic kidney disease, unspecified: Secondary | ICD-10-CM | POA: Diagnosis not present

## 2022-02-12 DIAGNOSIS — Z9889 Other specified postprocedural states: Secondary | ICD-10-CM | POA: Diagnosis not present

## 2022-02-12 DIAGNOSIS — Z888 Allergy status to other drugs, medicaments and biological substances status: Secondary | ICD-10-CM | POA: Diagnosis not present

## 2022-02-12 DIAGNOSIS — T7840XA Allergy, unspecified, initial encounter: Secondary | ICD-10-CM | POA: Diagnosis not present

## 2022-02-12 DIAGNOSIS — C3491 Malignant neoplasm of unspecified part of right bronchus or lung: Secondary | ICD-10-CM | POA: Diagnosis not present

## 2022-02-12 DIAGNOSIS — Z8261 Family history of arthritis: Secondary | ICD-10-CM | POA: Diagnosis not present

## 2022-02-12 DIAGNOSIS — D72829 Elevated white blood cell count, unspecified: Secondary | ICD-10-CM | POA: Diagnosis not present

## 2022-02-12 DIAGNOSIS — M86472 Chronic osteomyelitis with draining sinus, left ankle and foot: Secondary | ICD-10-CM | POA: Diagnosis not present

## 2022-02-12 DIAGNOSIS — Z91040 Latex allergy status: Secondary | ICD-10-CM | POA: Diagnosis not present

## 2022-02-12 DIAGNOSIS — I714 Abdominal aortic aneurysm, without rupture, unspecified: Secondary | ICD-10-CM | POA: Diagnosis present

## 2022-02-12 DIAGNOSIS — Z87891 Personal history of nicotine dependence: Secondary | ICD-10-CM | POA: Diagnosis not present

## 2022-02-12 DIAGNOSIS — I5032 Chronic diastolic (congestive) heart failure: Secondary | ICD-10-CM | POA: Diagnosis present

## 2022-02-12 DIAGNOSIS — Z8249 Family history of ischemic heart disease and other diseases of the circulatory system: Secondary | ICD-10-CM | POA: Diagnosis not present

## 2022-02-12 DIAGNOSIS — T8744 Infection of amputation stump, left lower extremity: Secondary | ICD-10-CM | POA: Diagnosis not present

## 2022-02-12 DIAGNOSIS — F17210 Nicotine dependence, cigarettes, uncomplicated: Secondary | ICD-10-CM | POA: Diagnosis not present

## 2022-02-12 DIAGNOSIS — Z85828 Personal history of other malignant neoplasm of skin: Secondary | ICD-10-CM | POA: Diagnosis not present

## 2022-02-12 DIAGNOSIS — Z8673 Personal history of transient ischemic attack (TIA), and cerebral infarction without residual deficits: Secondary | ICD-10-CM | POA: Diagnosis not present

## 2022-02-12 DIAGNOSIS — Z7982 Long term (current) use of aspirin: Secondary | ICD-10-CM

## 2022-02-12 DIAGNOSIS — M869 Osteomyelitis, unspecified: Secondary | ICD-10-CM | POA: Diagnosis not present

## 2022-02-12 DIAGNOSIS — I130 Hypertensive heart and chronic kidney disease with heart failure and stage 1 through stage 4 chronic kidney disease, or unspecified chronic kidney disease: Secondary | ICD-10-CM | POA: Diagnosis present

## 2022-02-12 DIAGNOSIS — Z9049 Acquired absence of other specified parts of digestive tract: Secondary | ICD-10-CM | POA: Diagnosis not present

## 2022-02-12 DIAGNOSIS — I10 Essential (primary) hypertension: Secondary | ICD-10-CM | POA: Diagnosis present

## 2022-02-12 DIAGNOSIS — M86672 Other chronic osteomyelitis, left ankle and foot: Secondary | ICD-10-CM | POA: Diagnosis not present

## 2022-02-12 DIAGNOSIS — Z89412 Acquired absence of left great toe: Secondary | ICD-10-CM

## 2022-02-12 DIAGNOSIS — Z880 Allergy status to penicillin: Secondary | ICD-10-CM | POA: Diagnosis not present

## 2022-02-12 DIAGNOSIS — Z79899 Other long term (current) drug therapy: Secondary | ICD-10-CM | POA: Diagnosis not present

## 2022-02-12 DIAGNOSIS — Z872 Personal history of diseases of the skin and subcutaneous tissue: Secondary | ICD-10-CM | POA: Diagnosis not present

## 2022-02-12 DIAGNOSIS — E1169 Type 2 diabetes mellitus with other specified complication: Secondary | ICD-10-CM | POA: Diagnosis not present

## 2022-02-12 DIAGNOSIS — S91302A Unspecified open wound, left foot, initial encounter: Secondary | ICD-10-CM | POA: Diagnosis not present

## 2022-02-12 DIAGNOSIS — S98112A Complete traumatic amputation of left great toe, initial encounter: Secondary | ICD-10-CM | POA: Diagnosis not present

## 2022-02-12 DIAGNOSIS — F419 Anxiety disorder, unspecified: Secondary | ICD-10-CM | POA: Diagnosis not present

## 2022-02-12 DIAGNOSIS — E860 Dehydration: Secondary | ICD-10-CM | POA: Diagnosis present

## 2022-02-12 DIAGNOSIS — Z881 Allergy status to other antibiotic agents status: Secondary | ICD-10-CM

## 2022-02-12 DIAGNOSIS — L0390 Cellulitis, unspecified: Secondary | ICD-10-CM | POA: Diagnosis not present

## 2022-02-12 DIAGNOSIS — M86172 Other acute osteomyelitis, left ankle and foot: Secondary | ICD-10-CM | POA: Diagnosis not present

## 2022-02-12 LAB — CBC WITH DIFFERENTIAL/PLATELET
Abs Immature Granulocytes: 0.04 10*3/uL (ref 0.00–0.07)
Basophils Absolute: 0.1 10*3/uL (ref 0.0–0.1)
Basophils Relative: 1 %
Eosinophils Absolute: 0.3 10*3/uL (ref 0.0–0.5)
Eosinophils Relative: 2 %
HCT: 45 % (ref 39.0–52.0)
Hemoglobin: 14.7 g/dL (ref 13.0–17.0)
Immature Granulocytes: 0 %
Lymphocytes Relative: 26 %
Lymphs Abs: 3.5 10*3/uL (ref 0.7–4.0)
MCH: 29.9 pg (ref 26.0–34.0)
MCHC: 32.7 g/dL (ref 30.0–36.0)
MCV: 91.6 fL (ref 80.0–100.0)
Monocytes Absolute: 1 10*3/uL (ref 0.1–1.0)
Monocytes Relative: 7 %
Neutro Abs: 8.7 10*3/uL — ABNORMAL HIGH (ref 1.7–7.7)
Neutrophils Relative %: 64 %
Platelets: 266 10*3/uL (ref 150–400)
RBC: 4.91 MIL/uL (ref 4.22–5.81)
RDW: 13.3 % (ref 11.5–15.5)
WBC: 13.7 10*3/uL — ABNORMAL HIGH (ref 4.0–10.5)
nRBC: 0 % (ref 0.0–0.2)

## 2022-02-12 LAB — COMPREHENSIVE METABOLIC PANEL
ALT: 13 U/L (ref 0–44)
AST: 16 U/L (ref 15–41)
Albumin: 3.8 g/dL (ref 3.5–5.0)
Alkaline Phosphatase: 129 U/L — ABNORMAL HIGH (ref 38–126)
Anion gap: 10 (ref 5–15)
BUN: 28 mg/dL — ABNORMAL HIGH (ref 8–23)
CO2: 26 mmol/L (ref 22–32)
Calcium: 9.2 mg/dL (ref 8.9–10.3)
Chloride: 100 mmol/L (ref 98–111)
Creatinine, Ser: 1.36 mg/dL — ABNORMAL HIGH (ref 0.61–1.24)
GFR, Estimated: 57 mL/min — ABNORMAL LOW (ref >60)
Glucose, Bld: 94 mg/dL (ref 70–99)
Potassium: 3.6 mmol/L (ref 3.5–5.1)
Sodium: 136 mmol/L (ref 135–145)
Total Bilirubin: 0.8 mg/dL (ref 0.3–1.2)
Total Protein: 7.3 g/dL (ref 6.5–8.1)

## 2022-02-12 LAB — LACTIC ACID, PLASMA: Lactic Acid, Venous: 1.4 mmol/L (ref 0.5–1.9)

## 2022-02-12 NOTE — Telephone Encounter (Signed)
Patient's wife called, says they are at [facility] ED per provider's recommendation, but that it may be 18 hours before they can be seen.   She would like to know if it's okay to take him to [facility] ED instead. Relayed that this should be fine as Dr. Candiss Norse will still have access to the notes in Epic.   Beryle Flock, RN

## 2022-02-12 NOTE — ED Notes (Signed)
E-signature pad broken, pt gives verbal consent

## 2022-02-12 NOTE — ED Notes (Signed)
Pt stated "my wife and I are just going to [place] to be treated." Pt and wife exited the ED

## 2022-02-12 NOTE — ED Triage Notes (Signed)
Pt to ED c/o left great toe pain/ concern for infection. Pt had left great toe amputation x 1 week ago,. Sent here today from infectious disease MD with concern for infection at site. Reports currently on abx.

## 2022-02-12 NOTE — Progress Notes (Addendum)
Patient: Juan Yang  DOB: 11-Jan-1956 MRN: [id number] PCP: Baruch Gouty, FNP        Patient Active Problem List   Diagnosis Date Noted   Osteomyelitis ([place]) 01/13/2022   Nicotine abuse 12/31/2021   Sinus tachycardia by electrocardiogram 12/31/2021   Dyspnea on minimal exertion 12/31/2021   Coronary artery calcification seen on CAT scan 12/31/2021   Squamous cell lung cancer, right ([place]) 12/18/2021   Calculus of gallbladder with acute cholecystitis without obstruction 12/12/2021   Leukocytosis 12/12/2021   Hypokalemia 12/12/2021   Hyponatremia 12/12/2021   Lung cancer ([place]) 12/12/2021   Chronic diastolic CHF (congestive heart failure) ([place]) 12/12/2021   Hilar adenopathy 11/22/2021   Abdominal aortic aneurysm (AAA) 3.0 cm to 5.5 cm in diameter in male [facility]) 10/03/2021   Secondary hyperparathyroidism ([place]) 07/22/2020   Vitamin D deficiency 07/22/2020   Anxiety and depression 07/22/2020   MGUS (monoclonal gammopathy of unknown significance) 07/22/2020   History of COPD 07/22/2020   Tobacco use disorder 07/22/2020   Fatty liver 07/22/2020   Essential hypertension 07/22/2020   DDD (degenerative disc disease), lumbar 07/22/2020   Hypercholesteremia 06/30/2020   Stage 3a chronic kidney disease ([place]) 06/30/2020   Lumbar radiculopathy 05/02/2020   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  Patient had an MRI done which showed left great toe osteomyelitis.  That he would hold antibiotics x 1 week prior to surgery.  Plan to do about 2 weeks of postop antibiotics possibly longer depending on wound healing.  Noted that p.o. preferred over IV to avoid PICC line access for antibiotics.  Sent prescription for doxycycline and Augmentin x 2 weeks  based on his description.  On 12/26 patient underwent partial toe amputation of the left great toe.  He had some bleeding from amputation toe site, antibiotics noted to be changed from Doxy and Augmentin to Doxy and Levaquin.  They never changed antibiotics.  Presented today as there is concern for worsening redness from amputation site which is tracked into his foot. Reports feeling fatigued.  Review of Systems  All other systems reviewed and are negative.   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   History of kidney stones    Hypertension    Peripheral neuropathy 10/29/2016   Pneumonia    Transient cerebral ischemic attack, unspecified 05/10/2020   stroke    Outpatient Medications Prior to Visit  Medication Sig Dispense Refill   amLODipine (NORVASC) 2.5 MG tablet Take 2.5 mg by mouth 2 (two) times daily.     aspirin EC (ADULT ASPIRIN REGIMEN) 81 MG tablet Take 325 mg by mouth daily.     calcium carbonate (TUMS - DOSED IN MG ELEMENTAL CALCIUM) 500 MG chewable tablet Chew  2 tablets by mouth daily.     clobetasol cream (TEMOVATE) 5.80 % Apply 1 application topically 2 (two) times daily as needed (rash).     cyclobenzaprine (FLEXERIL) 10 MG tablet Take 1 tablet (10 mg total) by mouth 3 (three) times daily as needed for muscle spasms. 30 tablet 0   doxycycline (VIBRA-TABS) 100 MG tablet Take 1 tablet (100 mg total) by mouth 2 (two) times daily for 16 days. 32 tablet 0   furosemide (LASIX) 40 MG tablet Take 1 tablet (40 mg total) by mouth daily. 30 tablet 3   Glucosamine HCl-MSM  (GLUCOSAMINE-MSM PO) Take 1 tablet by mouth daily.     levofloxacin (LEVAQUIN) 750 MG tablet Take 1 tablet (750 mg total) by mouth daily for 14 days. 14 tablet 0   losartan (COZAAR) 25 MG tablet Take 25 mg by mouth daily.     metoprolol succinate (TOPROL-XL) 25 MG 24 hr tablet Take 1 tablet (25 mg total) by mouth daily. 90 tablet 3   ondansetron (ZOFRAN) 4 MG tablet Take 1 tablet (4 mg total) by mouth every 8 (eight) hours as needed for nausea or vomiting. 20 tablet 0   oxyCODONE (OXY IR/ROXICODONE) 5 MG immediate release tablet Take 1-2 tablets (5-10 mg total) by mouth every 4 (four) hours as needed for breakthrough pain or severe pain. 20 tablet 0   Potassium 99 MG TABS Take 99 mg by mouth daily as needed (low potassium).     rosuvastatin (CRESTOR) 5 MG tablet Take 1 tablet (5 mg total) by mouth daily. 30 tablet 3   sildenafil (VIAGRA) 100 MG tablet Take 100 mg by mouth daily as needed for erectile dysfunction.     sodium bicarbonate 650 MG tablet Take 650 mg by mouth 2 (two) times daily.     No facility-administered medications prior to visit.     Allergies  Allergen Reactions   Other Hives    DUCK EGGS ONLY   Atenolol Diarrhea and Nausea Only    Pt unsure of allergy? Possible fainting?   Cymbalta [Duloxetine Hcl] Other (See Comments)    Pt states it shut his bladder down and could not urinate; states it made him lethargic as well.   Latex Itching   Penicillins Hives    Did it involve swelling of the face/tongue/throat, SOB, or low BP? Unknown Did it involve sudden or severe rash/hives, skin peeling, or any reaction on the inside of your mouth or nose? Unknown Did you need to seek medical attention at a hospital or doctor's office? yes When did it last happen?      Childhood If all above answers are "NO", may proceed with cephalosporin use.    Social History   Tobacco Use   Smoking status: Former    Packs/day: 1.50    Years: 20.00    Total pack years: 30.00    Types:  Cigarettes    Quit date: 11/12/2012    Years since quitting: 9.2    Passive exposure: Never   Smokeless tobacco: Never   Tobacco comments:    Pt smokes 1 pack a month and vapes CBD oil. ARJ 11/22/21  Vaping Use   Vaping Use: Former   Quit date: 12/18/2021   Substances: CBD  Substance Use Topics   Alcohol use: No   Drug use: No    Family History  Problem Relation Age of Onset   Charcot-Marie-Tooth disease Mother    Heart disease Father    COPD Father  Arthritis Sister     Objective:   Vitals:   02/12/22 1347  Weight: 194 lb (88 kg)   Body mass index is 26.31 kg/m.  Physical Exam Constitutional:      General: He is not in acute distress.    Appearance: He is normal weight. He is not toxic-appearing.  HENT:     Head: Normocephalic and atraumatic.     Right Ear: External ear normal.     Left Ear: External ear normal.     Nose: No congestion or rhinorrhea.     Mouth/Throat:     Mouth: Mucous membranes are moist.     Pharynx: Oropharynx is clear.  Eyes:     Extraocular Movements: Extraocular movements intact.     Conjunctiva/sclera: Conjunctivae normal.     Pupils: Pupils are equal, round, and reactive to light.  Cardiovascular:     Rate and Rhythm: Normal rate and regular rhythm.     Heart sounds: No murmur heard.    No friction rub. No gallop.  Pulmonary:     Effort: Pulmonary effort is normal.     Breath sounds: Normal breath sounds.  Abdominal:     General: Abdomen is flat. Bowel sounds are normal.     Palpations: Abdomen is soft.  Musculoskeletal:        General: No swelling.     Cervical back: Normal range of motion and neck supple.  Skin:    General: Skin is warm and dry.  Neurological:     General: No focal deficit present.     Mental Status: He is oriented to person, place, and time.  Psychiatric:        Mood and Affect: Mood normal.     Lab Results: Lab Results  Component Value Date   WBC 8.9 01/08/2022   HGB 12.6 (L) 01/08/2022   HCT  37.9 (L) 01/08/2022   MCV 91.3 01/08/2022   PLT 275 01/08/2022    Lab Results  Component Value Date   CREATININE 1.40 (H) 01/08/2022   BUN 14 01/08/2022   NA 135 01/08/2022   K 3.8 01/08/2022   CL 102 01/08/2022   CO2 23 01/08/2022    Lab Results  Component Value Date   ALT 33 12/15/2021   AST 33 12/15/2021   ALKPHOS 89 12/15/2021   BILITOT 0.5 12/15/2021     Assessment & Plan:  #Left first great toe OM SP amputation on 12/26 #Weakness x 1 week -No Cx obtained at amputation -On 1/1 pt noted redness at amputation which is progressing. Also, I had concern for underlying abscess given extent of inflammation. Plan: -Called ED, Please get MRI of left foot -cbc, cmp, blood Cx -If pt is not admitted then please administer a dose of ortavancin and start levaquin(broader coverage). Can transition to doxy -If plan to take to OR, please start doxy+ ctx+metronidazole after intervention.  -Follow-up with ID in 2 weeks  Laurice Record, [place] [facility]   02/12/22  2:20 PM

## 2022-02-12 NOTE — ED Provider Triage Note (Signed)
Emergency Medicine Provider Triage Evaluation Note  Juan Yang , a 67 y.o. male  was evaluated in triage.  Pt complains of left foot swelling/redness.  Patient with recent partial amputation of left great toe on 01/29/2022 by podiatry.  Patient currently taking doxycycline as well as Levaquin.  Was seen by infectious disease earlier today due to noticed redness, purulent drainage and was told to come the emergency department for further imaging/workup.  Patient denies fever, chills, night sweats.  Reports having baseline neuropathy affecting foot has not noticed swelling/redness until wife undressed affected foot earlier today.  Review of Systems  Positive: See above Negative:   Physical Exam  BP 125/89 (BP Location: Right Arm)   Pulse (!) 118   Temp (!) 97.5 F (36.4 C) (Oral)   Resp 18   Ht 6' (1.829 m)   Wt 89.4 kg   SpO2 99%   BMI 26.72 kg/m  Gen:   Awake, no distress   Resp:  Normal effort  MSK:   Moves extremities without difficulty  Other:       Medical Decision Making  Medically screening exam initiated at 6:13 PM.  Appropriate orders placed.  [name] was informed that the remainder of the evaluation will be completed by another provider, this initial triage assessment does not replace that evaluation, and the importance of remaining in the ED until their evaluation is complete.     Wilnette Kales, [place] 02/12/22 1815

## 2022-02-13 ENCOUNTER — Ambulatory Visit: Payer: Medicare Other | Admitting: Hematology

## 2022-02-13 ENCOUNTER — Other Ambulatory Visit: Payer: Self-pay

## 2022-02-13 ENCOUNTER — Encounter (HOSPITAL_COMMUNITY): Payer: Self-pay | Admitting: Family Medicine

## 2022-02-13 DIAGNOSIS — M869 Osteomyelitis, unspecified: Secondary | ICD-10-CM | POA: Diagnosis present

## 2022-02-13 DIAGNOSIS — Z87891 Personal history of nicotine dependence: Secondary | ICD-10-CM | POA: Diagnosis not present

## 2022-02-13 DIAGNOSIS — I714 Abdominal aortic aneurysm, without rupture, unspecified: Secondary | ICD-10-CM | POA: Diagnosis present

## 2022-02-13 DIAGNOSIS — Z8673 Personal history of transient ischemic attack (TIA), and cerebral infarction without residual deficits: Secondary | ICD-10-CM | POA: Diagnosis not present

## 2022-02-13 DIAGNOSIS — Z9049 Acquired absence of other specified parts of digestive tract: Secondary | ICD-10-CM | POA: Diagnosis not present

## 2022-02-13 DIAGNOSIS — Z7982 Long term (current) use of aspirin: Secondary | ICD-10-CM | POA: Diagnosis not present

## 2022-02-13 DIAGNOSIS — Z825 Family history of asthma and other chronic lower respiratory diseases: Secondary | ICD-10-CM | POA: Diagnosis not present

## 2022-02-13 DIAGNOSIS — Z880 Allergy status to penicillin: Secondary | ICD-10-CM | POA: Diagnosis not present

## 2022-02-13 DIAGNOSIS — Z8249 Family history of ischemic heart disease and other diseases of the circulatory system: Secondary | ICD-10-CM | POA: Diagnosis not present

## 2022-02-13 DIAGNOSIS — Z79899 Other long term (current) drug therapy: Secondary | ICD-10-CM | POA: Diagnosis not present

## 2022-02-13 DIAGNOSIS — Z85828 Personal history of other malignant neoplasm of skin: Secondary | ICD-10-CM | POA: Diagnosis not present

## 2022-02-13 DIAGNOSIS — T8744 Infection of amputation stump, left lower extremity: Secondary | ICD-10-CM | POA: Diagnosis present

## 2022-02-13 DIAGNOSIS — I5032 Chronic diastolic (congestive) heart failure: Secondary | ICD-10-CM

## 2022-02-13 DIAGNOSIS — I10 Essential (primary) hypertension: Secondary | ICD-10-CM | POA: Diagnosis not present

## 2022-02-13 DIAGNOSIS — M86472 Chronic osteomyelitis with draining sinus, left ankle and foot: Secondary | ICD-10-CM | POA: Diagnosis not present

## 2022-02-13 DIAGNOSIS — Z91040 Latex allergy status: Secondary | ICD-10-CM | POA: Diagnosis not present

## 2022-02-13 DIAGNOSIS — D72829 Elevated white blood cell count, unspecified: Secondary | ICD-10-CM | POA: Diagnosis present

## 2022-02-13 DIAGNOSIS — C3491 Malignant neoplasm of unspecified part of right bronchus or lung: Secondary | ICD-10-CM

## 2022-02-13 DIAGNOSIS — N189 Chronic kidney disease, unspecified: Secondary | ICD-10-CM | POA: Diagnosis present

## 2022-02-13 DIAGNOSIS — F17210 Nicotine dependence, cigarettes, uncomplicated: Secondary | ICD-10-CM | POA: Diagnosis present

## 2022-02-13 DIAGNOSIS — Z87442 Personal history of urinary calculi: Secondary | ICD-10-CM | POA: Diagnosis not present

## 2022-02-13 DIAGNOSIS — E860 Dehydration: Secondary | ICD-10-CM | POA: Diagnosis present

## 2022-02-13 DIAGNOSIS — I130 Hypertensive heart and chronic kidney disease with heart failure and stage 1 through stage 4 chronic kidney disease, or unspecified chronic kidney disease: Secondary | ICD-10-CM | POA: Diagnosis present

## 2022-02-13 DIAGNOSIS — T7840XA Allergy, unspecified, initial encounter: Secondary | ICD-10-CM | POA: Diagnosis not present

## 2022-02-13 DIAGNOSIS — F419 Anxiety disorder, unspecified: Secondary | ICD-10-CM | POA: Diagnosis present

## 2022-02-13 DIAGNOSIS — M86672 Other chronic osteomyelitis, left ankle and foot: Secondary | ICD-10-CM | POA: Diagnosis not present

## 2022-02-13 DIAGNOSIS — M86172 Other acute osteomyelitis, left ankle and foot: Secondary | ICD-10-CM | POA: Diagnosis not present

## 2022-02-13 DIAGNOSIS — Z8261 Family history of arthritis: Secondary | ICD-10-CM | POA: Diagnosis not present

## 2022-02-13 DIAGNOSIS — L0390 Cellulitis, unspecified: Secondary | ICD-10-CM | POA: Diagnosis not present

## 2022-02-13 DIAGNOSIS — Z888 Allergy status to other drugs, medicaments and biological substances status: Secondary | ICD-10-CM | POA: Diagnosis not present

## 2022-02-13 DIAGNOSIS — Z89412 Acquired absence of left great toe: Secondary | ICD-10-CM | POA: Diagnosis not present

## 2022-02-13 LAB — CBC WITH DIFFERENTIAL/PLATELET
Abs Immature Granulocytes: 0.03 10*3/uL (ref 0.00–0.07)
Basophils Absolute: 0.1 10*3/uL (ref 0.0–0.1)
Basophils Relative: 1 %
Eosinophils Absolute: 0.3 10*3/uL (ref 0.0–0.5)
Eosinophils Relative: 2 %
HCT: 40.2 % (ref 39.0–52.0)
Hemoglobin: 13.7 g/dL (ref 13.0–17.0)
Immature Granulocytes: 0 %
Lymphocytes Relative: 25 %
Lymphs Abs: 2.9 10*3/uL (ref 0.7–4.0)
MCH: 30.7 pg (ref 26.0–34.0)
MCHC: 34.1 g/dL (ref 30.0–36.0)
MCV: 90.1 fL (ref 80.0–100.0)
Monocytes Absolute: 0.9 10*3/uL (ref 0.1–1.0)
Monocytes Relative: 8 %
Neutro Abs: 7.3 10*3/uL (ref 1.7–7.7)
Neutrophils Relative %: 64 %
Platelets: 249 10*3/uL (ref 150–400)
RBC: 4.46 MIL/uL (ref 4.22–5.81)
RDW: 13.4 % (ref 11.5–15.5)
WBC: 11.5 10*3/uL — ABNORMAL HIGH (ref 4.0–10.5)
nRBC: 0 % (ref 0.0–0.2)

## 2022-02-13 LAB — COMPREHENSIVE METABOLIC PANEL
ALT: 12 U/L (ref 0–44)
AST: 16 U/L (ref 15–41)
Albumin: 3.4 g/dL — ABNORMAL LOW (ref 3.5–5.0)
Alkaline Phosphatase: 115 U/L (ref 38–126)
Anion gap: 8 (ref 5–15)
BUN: 23 mg/dL (ref 8–23)
CO2: 26 mmol/L (ref 22–32)
Calcium: 8.5 mg/dL — ABNORMAL LOW (ref 8.9–10.3)
Chloride: 102 mmol/L (ref 98–111)
Creatinine, Ser: 1.27 mg/dL — ABNORMAL HIGH (ref 0.61–1.24)
GFR, Estimated: >60 mL/min (ref >60)
Glucose, Bld: 116 mg/dL — ABNORMAL HIGH (ref 70–99)
Potassium: 3.5 mmol/L (ref 3.5–5.1)
Sodium: 136 mmol/L (ref 135–145)
Total Bilirubin: 0.8 mg/dL (ref 0.3–1.2)
Total Protein: 6.9 g/dL (ref 6.5–8.1)

## 2022-02-13 LAB — PROCALCITONIN: Procalcitonin: <0.1 ng/mL

## 2022-02-13 LAB — MAGNESIUM: Magnesium: 2.2 mg/dL (ref 1.7–2.4)

## 2022-02-13 MED ORDER — SODIUM CHLORIDE 0.9 % IV SOLN: INTRAVENOUS | Status: AC

## 2022-02-13 MED ORDER — AMLODIPINE BESYLATE 5 MG PO TABS
2.5000 mg | ORAL_TABLET | Freq: Two times a day (BID) | ORAL | Status: DC
  Administered 2022-02-13 – 2022-02-19 (×13): 2.5 mg via ORAL
  Filled 2022-02-13 (×13): qty 1

## 2022-02-13 MED ORDER — ONDANSETRON HCL 4 MG PO TABS: 4.0000 mg | ORAL_TABLET | Freq: Four times a day (QID) | ORAL | Status: DC | PRN

## 2022-02-13 MED ORDER — VANCOMYCIN HCL 1750 MG/350ML IV SOLN
1750.0000 mg | INTRAVENOUS | Status: DC
  Administered 2022-02-13: 1750 mg via INTRAVENOUS
  Filled 2022-02-13: qty 350

## 2022-02-13 MED ORDER — ACETAMINOPHEN 650 MG RE SUPP: 650.0000 mg | Freq: Four times a day (QID) | RECTAL | Status: DC | PRN

## 2022-02-13 MED ORDER — MORPHINE SULFATE (PF) 2 MG/ML IV SOLN: 2.0000 mg | INTRAVENOUS | Status: DC | PRN

## 2022-02-13 MED ORDER — ONDANSETRON HCL 4 MG/2ML IJ SOLN: 4.0000 mg | Freq: Four times a day (QID) | INTRAMUSCULAR | Status: DC | PRN

## 2022-02-13 MED ORDER — ACETAMINOPHEN 325 MG PO TABS
650.0000 mg | ORAL_TABLET | Freq: Four times a day (QID) | ORAL | Status: DC | PRN
  Administered 2022-02-17: 650 mg via ORAL
  Filled 2022-02-13: qty 2

## 2022-02-13 MED ORDER — VANCOMYCIN HCL 2000 MG/400ML IV SOLN
2000.0000 mg | Freq: Once | INTRAVENOUS | Status: AC
  Administered 2022-02-13: 2000 mg via INTRAVENOUS
  Filled 2022-02-13: qty 400

## 2022-02-13 MED ORDER — ASPIRIN 325 MG PO TBEC
325.0000 mg | DELAYED_RELEASE_TABLET | Freq: Every day | ORAL | Status: DC
  Administered 2022-02-13 – 2022-02-19 (×7): 325 mg via ORAL
  Filled 2022-02-13 (×7): qty 1

## 2022-02-13 MED ORDER — ROSUVASTATIN CALCIUM 5 MG PO TABS
5.0000 mg | ORAL_TABLET | Freq: Every day | ORAL | Status: DC
  Administered 2022-02-16: 5 mg via ORAL
  Filled 2022-02-13 (×7): qty 1

## 2022-02-13 MED ORDER — METOPROLOL SUCCINATE ER 25 MG PO TB24
25.0000 mg | ORAL_TABLET | Freq: Every day | ORAL | Status: DC
  Administered 2022-02-13 – 2022-02-19 (×7): 25 mg via ORAL
  Filled 2022-02-13 (×7): qty 1

## 2022-02-13 MED ORDER — OXYCODONE HCL 5 MG PO TABS: 5.0000 mg | ORAL_TABLET | ORAL | Status: DC | PRN

## 2022-02-13 MED ORDER — LEVOFLOXACIN IN D5W 750 MG/150ML IV SOLN
750.0000 mg | Freq: Once | INTRAVENOUS | Status: AC
  Administered 2022-02-13: 750 mg via INTRAVENOUS
  Filled 2022-02-13: qty 150

## 2022-02-13 MED ORDER — LEVOFLOXACIN IN D5W 750 MG/150ML IV SOLN
750.0000 mg | INTRAVENOUS | Status: DC
  Administered 2022-02-14 – 2022-02-15 (×2): 750 mg via INTRAVENOUS
  Filled 2022-02-13 (×2): qty 150

## 2022-02-13 MED ORDER — LOSARTAN POTASSIUM 50 MG PO TABS
25.0000 mg | ORAL_TABLET | Freq: Every day | ORAL | Status: DC
  Administered 2022-02-13 – 2022-02-14 (×2): 25 mg via ORAL
  Filled 2022-02-13 (×2): qty 1

## 2022-02-13 MED ORDER — HEPARIN SODIUM (PORCINE) 5000 UNIT/ML IJ SOLN
5000.0000 [IU] | Freq: Three times a day (TID) | INTRAMUSCULAR | Status: DC
  Filled 2022-02-13 (×7): qty 1

## 2022-02-13 NOTE — Assessment & Plan Note (Signed)
-   Continue Norvasc, losartan, metoprolol

## 2022-02-13 NOTE — Progress Notes (Signed)
PROGRESS NOTE    Patient: Juan Yang                            PCP: Baruch Gouty, FNP                    DOB: 1955-02-20            DOA: 02/12/2022 JAS:[id number]             DOS: 02/13/2022, 12:14 PM   LOS: 0 days   Date of Service: The patient was seen and examined on 02/13/2022  Subjective:   The patient was seen and examined this morning. Stable at this time. Still complaining of :  Otherwise no issues overnight .  Brief Narrative:   Juan Yang is a 67 y.o. 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  Patient reports he had his great toe on his left foot amputated at on 02/04/2022.  On January 1 he started to have erythema at the incision site.  He went to infectious disease and they sent him to the [facility], ER which had a horrendous weight so he decided to come to [facility].  Patient had been discharged previously on p.o. Levaquin and doxycycline.  Apparently at that time they offered to set him up with outpatient IV antibiotics, but patient was not interested.  They then put him on Augmentin which caused him to have a rash, so he was switched to doxycycline and Levaquin.  He reports compliance with this regimen.  He has had no fever.  He reports that his wound has been draining a reports some of it is purulent and some of it is serosanguineous.  He is not sure if the drainage has an odor.  Patient reports he has neuropathy and so he does not feel much in his feet, so it has not been incredibly painful    ED: Temp Max 97.5, HR 94-118, RR 18, blood pressure 125/89-130/89, satting at 99% Leukocytosis 13.7, hemoglobin 14.7, platelets 266 Chemistries unremarkable Blood culture pending X-ray foot shows amputation of the partial first digit MR left foot shows concern for  osteomyelitis Antibiotic options were discussed with pharmacy and Levaquin and vancomycin was started Patient's lactic acid goes from 1.2>> 1.4     Assessment & Plan:   Principal Problem:   Osteomyelitis ([place]) Active Problems:   Essential hypertension   Leukocytosis   Chronic diastolic CHF (congestive heart failure) (HCC)   Squamous cell lung cancer, right (HCC)     Assessment and Plan: * Osteomyelitis ([place]) - MR left foot shows proximal phalanx of the first digit concerning for osteomyelitis - Patient has been on about 9 days of doxycycline and Levaquin - Pharmacy recommends vancomycin and Levaquin IV -Consulted infectious disease Dr. Elba Barman who recommended patient to be transferred to [facility] or [facility] - Dr. Amalia Hailey was notified and consulted will see the patient on arrival - Blood cultures pending   Squamous cell lung cancer, right ([place]) - Recently diagnosed - Has not yet started chemo, as his osteomyelitis is a barrier to starting chemo - Patient has quit tobacco products - Continue to monitor  Chronic diastolic CHF (congestive heart failure) ([place]) - Continue aspirin, Norvasc, losartan, metoprolol - Monitor on telemetry - Clinically no signs of exacerbation  at this time - Last echo was in October 2023 showing ejection fraction of 60-65% and grade 1 diastolic dysfunction -Judicial fluid resuscitation  Leukocytosis - 13.7 >> 11.5, - Likely related to osteomyelitis - Continue treatment as per osteomyelitis - Trend in the a.m.  Essential hypertension - Continue Norvasc, losartan, metoprolol            ------------------------------------------------------------------------------------------------------------------------------------- Nutritional status:  The patient's BMI is: Body mass index is 26.72 kg/m. I agree with the assessment and plan as outlined  Skin Assessment: I have examined the patient's skin and I agree with the wound assessment as performed by wound  care team ---------------------------------------------------------------------------------------------------------------------------------- Cultures; Blood Cultures x 2 >> NGT Urine Culture  >>> NGT  Sputum Culture >> NGT   ------------------------------------------------------------------------------------------------------------------------------------  DVT prophylaxis:  heparin injection 5,000 Units Start: 02/13/22 1400 SCDs Start: 02/13/22 0730   Code Status:   Code Status: Full Code  Family Communication: No family member present at bedside- attempt will be made to update daily The above findings and plan of care has been discussed with patient (and family)  in detail,  they expressed understanding and agreement of above. -Advance care planning has been discussed.   Admission status:   Status is: Inpatient Remains inpatient appropriate because: Needing IV antibiotics, input from consultants ID and surgery Dr. Amalia Hailey for further evaluation possible further debridement     Procedures:   No admission procedures for hospital encounter.   Antimicrobials:  Anti-infectives (From admission, onward)    Start     Dose/Rate Route Frequency Ordered Stop   02/14/22 0800  levofloxacin (LEVAQUIN) IVPB 750 mg        750 mg 100 mL/hr over 90 Minutes Intravenous Every 24 hours 02/13/22 0729     02/14/22 0000  vancomycin (VANCOREADY) IVPB 1750 mg/350 mL        1,750 mg 175 mL/hr over 120 Minutes Intravenous Every 24 hours 02/13/22 0206     02/13/22 0800  levofloxacin (LEVAQUIN) IVPB 750 mg        750 mg 100 mL/hr over 90 Minutes Intravenous  Once 02/13/22 0137 02/13/22 0854   02/13/22 0200  vancomycin (VANCOREADY) IVPB 2000 mg/400 mL        2,000 mg 200 mL/hr over 120 Minutes Intravenous  Once 02/13/22 0137 02/13/22 0400        Medication:   amLODipine  2.5 mg Oral BID   aspirin EC  325 mg Oral Daily   heparin  5,000 Units Subcutaneous Q8H   losartan  25 mg Oral Daily    metoprolol succinate  25 mg Oral Daily   rosuvastatin  5 mg Oral Daily    acetaminophen **OR** acetaminophen, morphine injection, ondansetron **OR** ondansetron (ZOFRAN) IV, oxyCODONE   Objective:   Vitals:   02/13/22 1031 02/13/22 1100 02/13/22 1130 02/13/22 1200  BP:  (!) 129/97 (!) 128/92 (!) 141/98  Pulse:  (!) 114 (!) 107 (!) 102  Resp:  16 12 18   Temp: 98.5 F (36.9 C)     TempSrc: Oral     SpO2:  100% 99% 100%  Weight:      Height:        Intake/Output Summary (Last 24 hours) at 02/13/2022 1214 Last data filed at 02/13/2022 0854 Gross per 24 hour  Intake 535.09 ml  Output --  Net 535.09 ml   Filed Weights   02/12/22 1722  Weight: 89.4 kg     Examination:   Physical Exam  Constitution:  Alert, cooperative, no  distress,  Appears calm and comfortable  Psychiatric:   Normal and stable mood and affect, cognition intact,   HEENT:        Normocephalic, PERRL, otherwise with in Normal limits  Chest:         Chest symmetric Cardio vascular:  S1/S2, RRR, No murmure, No Rubs or Gallops  pulmonary: Clear to auscultation bilaterally, respirations unlabored, negative wheezes / crackles Abdomen: Soft, non-tender, non-distended, bowel sounds,no masses, no organomegaly Muscular skeletal: Right first digit amputated with erythema edema suture lines in place, tender PDL pulses present Limited exam - in bed, able to move all 4 extremities,   Neuro: CNII-XII intact. , normal motor and sensation, reflexes intact  Extremities: Amputated right first digit, with edema erythema  Otherwise no further  pitting edema lower extremities, +2 pulses  Skin: Dry, warm to touch, negative for any Rashes, No open wounds Wounds: per nursing documentation   ------------------------------------------------------------------------------------------------------------------------------------------    LABs:     Latest Ref Rng & Units 02/13/2022    7:43 AM 02/12/2022    8:07 PM 01/08/2022    4:26 PM   CBC  WBC 4.0 - 10.5 K/uL 11.5  13.7  8.9   Hemoglobin 13.0 - 17.0 g/dL 13.7  14.7  12.6   Hematocrit 39.0 - 52.0 % 40.2  45.0  37.9   Platelets 150 - 400 K/uL 249  266  275       Latest Ref Rng & Units 02/13/2022    7:43 AM 02/12/2022    8:07 PM 01/08/2022    4:26 PM  CMP  Glucose 70 - 99 mg/dL 116  94  90   BUN 8 - 23 mg/dL 23  28  14    Creatinine 0.61 - 1.24 mg/dL 1.27  1.36  1.40   Sodium 135 - 145 mmol/L 136  136  135   Potassium 3.5 - 5.1 mmol/L 3.5  3.6  3.8   Chloride 98 - 111 mmol/L 102  100  102   CO2 22 - 32 mmol/L 26  26  23    Calcium 8.9 - 10.3 mg/dL 8.5  9.2  9.0   Total Protein 6.5 - 8.1 g/dL 6.9  7.3    Total Bilirubin 0.3 - 1.2 mg/dL 0.8  0.8    Alkaline Phos 38 - 126 U/L 115  129    AST 15 - 41 U/L 16  16    ALT 0 - 44 U/L 12  13         Micro Results Recent Results (from the past 240 hour(s))  Blood culture (routine x 2)     Status: None (Preliminary result)   Collection Time: 02/12/22  8:07 PM   Specimen: Blood  Result Value Ref Range Status   Specimen Description   Final    LEFT ANTECUBITAL BOTTLES DRAWN AEROBIC AND ANAEROBIC   Special Requests   Final    Blood Culture adequate volume Performed at [facility], [address] [place], [place] [address]    Culture PENDING  Incomplete   Report Status PENDING  Incomplete  Blood culture (routine x 2)     Status: None (Preliminary result)   Collection Time: 02/12/22  8:14 PM   Specimen: Blood  Result Value Ref Range Status   Specimen Description   Final    RIGHT ANTECUBITAL BOTTLES DRAWN AEROBIC AND ANAEROBIC   Special Requests   Final    Blood Culture adequate volume Performed at [facility], [address] [place],  [place] [address]    Culture PENDING  Incomplete   Report Status PENDING  Incomplete    Radiology Reports MR FOOT LEFT WO CONTRAST  Result Date: 02/12/2022 CLINICAL DATA:  Soft tissue infection suspected. Infection at the toe amputation site. EXAM: MRI OF THE LEFT FOOT WITHOUT  CONTRAST TECHNIQUE: Multiplanar, multisequence MR imaging of the left forefoot was performed. No intravenous contrast was administered. COMPARISON:  Radiographs dated February 12, 2022 and prior MRI examination dated January 13, 2022 FINDINGS: Bones/Joint/Cartilage Postsurgical changes for prior amputation through the base of the proximal phalanx of the first digit. There is marrow edema about the amputation site. Ligaments Lisfranc and collateral ligaments are intact. Muscles and Tendons Increased intrasubstance signal of the plantar muscles suggesting diabetic myopathy/myositis. No fluid collection or abscess. Soft tissues Skin wound about the amputation site with subjacent edema and inflammatory changes. Generalized subcutaneous edema about the dorsum of the foot. No fluid collection. IMPRESSION: 1. Bone marrow edema about the amputation site of the residual proximal phalanx of the first digit concerning for osteomyelitis. 2. Skin wound about the amputation site with subjacent edema and inflammatory changes. No fluid collection or abscess. 3. Increased intrasubstance signal of the plantar muscles suggesting diabetic myopathy/myositis. Electronically Signed   By: Keane Police D.O.   On: 02/12/2022 21:31   DG Foot Complete Left  Result Date: 02/12/2022 CLINICAL DATA:  Great toe pain EXAM: LEFT FOOT - COMPLETE 3+ VIEW COMPARISON:  01/01/2022 FINDINGS: Status post partial amputation first digit at the level of proximal phalanx. Cut margins appear smooth. No erosive change. No soft tissue emphysema. IMPRESSION: Status post partial amputation of the first digit. No acute osseous abnormality. Electronically Signed   By: Donavan Foil M.D.   On: 02/12/2022 18:51    SIGNED: Deatra James, MD, FHM. [facility],  Pager (please use [email or web address] to page/text) Please use Epic Secure Chat for non-urgent communication (7AM-7PM)  If 7PM-7AM, please contact night-coverage [email or web address], 02/13/2022, 12:14  PM

## 2022-02-13 NOTE — ED Notes (Signed)
Patient left foot rewrapped. Patient ambulatory to restroom with steady gait.

## 2022-02-13 NOTE — ED Provider Notes (Addendum)
[facility] EMERGENCY DEPARTMENT Provider Note   CSN: [id number] Arrival date & time: 02/12/22  1707     History  Chief Complaint  Patient presents with   Toe Injury    Left great toe    Juan Yang is a 67 y.o. male.  Patient was referred to the emergency department by infectious disease.  He is being cared for by infectious disease after he had amputation of his left great toe on December 20 secondary to osteomyelitis.  He is currently on doxycycline and Levaquin.  He was seen by infectious disease and referred to the emergency department because of increased signs of infection.       Home Medications Prior to Admission medications   Medication Sig Start Date End Date Taking? Authorizing Provider  amLODipine (NORVASC) 2.5 MG tablet Take 2.5 mg by mouth 2 (two) times daily. 04/18/19   [provider]  aspirin EC (ADULT ASPIRIN REGIMEN) 81 MG tablet Take 325 mg by mouth daily. 09/06/21   [provider]  calcium carbonate (TUMS - DOSED IN MG ELEMENTAL CALCIUM) 500 MG chewable tablet Chew 2 tablets by mouth daily.    [provider]  clobetasol cream (TEMOVATE) 6.60 % Apply 1 application topically 2 (two) times daily as needed (rash).    [provider]  cyclobenzaprine (FLEXERIL) 10 MG tablet Take 1 tablet (10 mg total) by mouth 3 (three) times daily as needed for muscle spasms. 05/03/20   Vallarie Mare, MD  doxycycline (VIBRA-TABS) 100 MG tablet Take 1 tablet (100 mg total) by mouth 2 (two) times daily for 16 days. 02/04/22 02/20/22  Mignon Pine, DO  furosemide (LASIX) 40 MG tablet Take 1 tablet (40 mg total) by mouth daily. 12/31/21   Mallipeddi, Vishnu P, MD  Glucosamine HCl-MSM (GLUCOSAMINE-MSM PO) Take 1 tablet by mouth daily.    [provider]  levofloxacin (LEVAQUIN) 750 MG tablet Take 1 tablet (750 mg total) by mouth daily for 14 days. 02/06/22 02/20/22  Mignon Pine, DO  losartan (COZAAR) 25 MG tablet Take 25 mg  by mouth daily. 07/23/20   [provider]  metoprolol succinate (TOPROL-XL) 25 MG 24 hr tablet Take 1 tablet (25 mg total) by mouth daily. 10/03/21   Baruch Gouty, FNP  ondansetron (ZOFRAN) 4 MG tablet Take 1 tablet (4 mg total) by mouth every 8 (eight) hours as needed for nausea or vomiting. 12/12/21   Baruch Gouty, FNP  oxyCODONE (OXY IR/ROXICODONE) 5 MG immediate release tablet Take 1-2 tablets (5-10 mg total) by mouth every 4 (four) hours as needed for breakthrough pain or severe pain. 12/15/21   Manuella Ghazi, Pratik D, DO  Potassium 99 MG TABS Take 99 mg by mouth daily as needed (low potassium).    [provider]  rosuvastatin (CRESTOR) 5 MG tablet Take 1 tablet (5 mg total) by mouth daily. 12/31/21   Mallipeddi, Vishnu P, MD  sildenafil (VIAGRA) 100 MG tablet Take 100 mg by mouth daily as needed for erectile dysfunction.    [provider]  sodium bicarbonate 650 MG tablet Take 650 mg by mouth 2 (two) times daily. 11/09/20   [provider]      Allergies    Other, Atenolol, Cymbalta [duloxetine hcl], Latex, and Penicillins    Review of Systems   Review of Systems  Physical Exam Updated Vital Signs BP 130/89 (BP Location: Right Arm)   Pulse 94   Temp (!) 97.5 F (36.4 C) (Oral)  Resp 18   Ht 6' (1.829 m)   Wt 89.4 kg   SpO2 99%   BMI 26.72 kg/m  Physical Exam Vitals and nursing note reviewed.  Constitutional:      General: He is not in acute distress.    Appearance: He is well-developed.  HENT:     Head: Normocephalic and atraumatic.     Mouth/Throat:     Mouth: Mucous membranes are moist.  Eyes:     General: Vision grossly intact. Gaze aligned appropriately.     Extraocular Movements: Extraocular movements intact.     Conjunctiva/sclera: Conjunctivae normal.  Cardiovascular:     Rate and Rhythm: Normal rate and regular rhythm.     Pulses: Normal pulses.     Heart sounds: Normal heart sounds, S1 normal and S2 normal. No murmur heard.     No friction rub. No gallop.  Pulmonary:     Effort: Pulmonary effort is normal. No respiratory distress.     Breath sounds: Normal breath sounds.  Abdominal:     Palpations: Abdomen is soft.     Tenderness: There is no abdominal tenderness. There is no guarding or rebound.     Hernia: No hernia is present.  Musculoskeletal:        General: No swelling.     Cervical back: Full passive range of motion without pain, normal range of motion and neck supple. No pain with movement, spinous process tenderness or muscular tenderness. Normal range of motion.     Right lower leg: No edema.     Left lower leg: No edema.  Skin:    General: Skin is warm and dry.     Capillary Refill: Capillary refill takes less than 2 seconds.     Findings: Wound (Amputation site left great toe, surrounding erythema and swelling) present. No ecchymosis, erythema or lesion.  Neurological:     Mental Status: He is alert and oriented to person, place, and time.     GCS: GCS eye subscore is 4. GCS verbal subscore is 5. GCS motor subscore is 6.     Cranial Nerves: Cranial nerves 2-12 are intact.     Sensory: Sensation is intact.     Motor: Motor function is intact. No weakness or abnormal muscle tone.     Coordination: Coordination is intact.  Psychiatric:        Mood and Affect: Mood normal.        Speech: Speech normal.        Behavior: Behavior normal.     ED Results / Procedures / Treatments   Labs (all labs ordered are listed, but only abnormal results are displayed) Labs Reviewed  CBC WITH DIFFERENTIAL/PLATELET - Abnormal; Notable for the following components:      Result Value   WBC 13.7 (*)    Neutro Abs 8.7 (*)    All other components within normal limits  COMPREHENSIVE METABOLIC PANEL - Abnormal; Notable for the following components:   BUN 28 (*)    Creatinine, Ser 1.36 (*)    Alkaline Phosphatase 129 (*)    GFR, Estimated 57 (*)    All other components within normal limits  CULTURE, BLOOD  (ROUTINE X 2)  CULTURE, BLOOD (ROUTINE X 2)  LACTIC ACID, PLASMA  LACTIC ACID, PLASMA    EKG None  Radiology MR FOOT LEFT WO CONTRAST  Result Date: 02/12/2022 CLINICAL DATA:  Soft tissue infection suspected. Infection at the toe amputation site. EXAM: MRI OF THE LEFT FOOT WITHOUT  CONTRAST TECHNIQUE: Multiplanar, multisequence MR imaging of the left forefoot was performed. No intravenous contrast was administered. COMPARISON:  Radiographs dated February 12, 2022 and prior MRI examination dated January 13, 2022 FINDINGS: Bones/Joint/Cartilage Postsurgical changes for prior amputation through the base of the proximal phalanx of the first digit. There is marrow edema about the amputation site. Ligaments Lisfranc and collateral ligaments are intact. Muscles and Tendons Increased intrasubstance signal of the plantar muscles suggesting diabetic myopathy/myositis. No fluid collection or abscess. Soft tissues Skin wound about the amputation site with subjacent edema and inflammatory changes. Generalized subcutaneous edema about the dorsum of the foot. No fluid collection. IMPRESSION: 1. Bone marrow edema about the amputation site of the residual proximal phalanx of the first digit concerning for osteomyelitis. 2. Skin wound about the amputation site with subjacent edema and inflammatory changes. No fluid collection or abscess. 3. Increased intrasubstance signal of the plantar muscles suggesting diabetic myopathy/myositis. Electronically Signed   By: Keane Police D.O.   On: 02/12/2022 21:31   DG Foot Complete Left  Result Date: 02/12/2022 CLINICAL DATA:  Great toe pain EXAM: LEFT FOOT - COMPLETE 3+ VIEW COMPARISON:  01/01/2022 FINDINGS: Status post partial amputation first digit at the level of proximal phalanx. Cut margins appear smooth. No erosive change. No soft tissue emphysema. IMPRESSION: Status post partial amputation of the first digit. No acute osseous abnormality. Electronically Signed   By: Donavan Foil M.D.   On: 02/12/2022 18:51    Procedures Procedures    Medications Ordered in ED Medications - No data to display  ED Course/ Medical Decision Making/ A&P                           Medical Decision Making Risk Prescription drug management.   Presents to the emergency department for evaluation of worsening infection of the left great toe surgical site.  He had an amputation secondary to osteomyelitis.  He did not want to have IV antibiotics, has been on oral doxycycline and Levaquin postoperatively.  Patient with worsening signs of infection.  MRI today shows osteomyelitis without abscess formation in the soft tissues.  Recommend hospitalization for IV antibiotics.  Addendum: Discussed with pharmacy.  Patient was to get oritavancin as a single dose if he was discharged.  He will be admitted because of the MRI findings.  There were also instructions if he needed further surgery for abscess which the MRI has ruled out.  It was not clear what to do for IV antibiotics for simple admission for treatment of osteo.  Pharmacy has recommended IV vancomycin and continue IV Levaquin.  Likely will need repeat infectious disease consultation in the morning but this should be broad enough coverage for tonight.        Final Clinical Impression(s) / ED Diagnoses Final diagnoses:  Osteomyelitis of left foot, unspecified type [facility])    Rx / DC Orders ED Discharge Orders     None         Makeisha Jentsch, Gwenyth Allegra, MD 02/13/22 4098    Orpah Greek, MD 02/13/22 0157

## 2022-02-13 NOTE — Progress Notes (Signed)
Pharmacy Antibiotic Note  DRAVON NOTT is a 67 y.o. male admitted on 02/12/2022 with  osteomyelitis .  Pharmacy has been consulted for vancomycin dosing.  Plan: Vancomycin 2000mg  x1 then 1750mg  IV Q24H. Goal AUC 400-550.  Expected AUC 510.  Height: 6' (182.9 cm) Weight: 89.4 kg (197 lb) IBW/kg (Calculated) : 77.6  Temp (24hrs), Avg:97.5 F (36.4 C), Min:97.5 F (36.4 C), Max:97.5 F (36.4 C)  Recent Labs  Lab 02/12/22 2000 02/12/22 2007  WBC  --  13.7*  CREATININE  --  1.36*  LATICACIDVEN 1.4  --     Estimated Creatinine Clearance: 58.6 mL/min (A) (by C-G formula based on SCr of 1.36 mg/dL (H)).    Allergies  Allergen Reactions   Other Hives    DUCK EGGS ONLY   Atenolol Diarrhea and Nausea Only    Pt unsure of allergy? Possible fainting?   Cymbalta [Duloxetine Hcl] Other (See Comments)    Pt states it shut his bladder down and could not urinate; states it made him lethargic as well.   Latex Itching   Penicillins Hives    Did it involve swelling of the face/tongue/throat, SOB, or low BP? Unknown Did it involve sudden or severe rash/hives, skin peeling, or any reaction on the inside of your mouth or nose? Unknown Did you need to seek medical attention at a hospital or doctor's office? yes When did it last happen?      Childhood If all above answers are "NO", may proceed with cephalosporin use.     Thank you for allowing pharmacy to be a part of this patient's care.  Wynona Neat, PharmD, BCPS  02/13/2022 2:03 AM

## 2022-02-13 NOTE — TOC Progression Note (Signed)
  Transition of Care [facility]) Screening Note   Patient Details  Name: Juan Yang Date of Birth: 1955-09-01   Transition [facility]) CM/SW Contact:    Shade Flood, LCSW Phone Number: 02/13/2022, 10:56 AM    Transition of Care Department [facility]) has reviewed patient and no TOC needs have been identified at this time. We will continue to monitor patient advancement through interdisciplinary progression rounds. If new patient transition needs arise, please place a TOC consult.

## 2022-02-13 NOTE — Assessment & Plan Note (Addendum)
-   13.7 >> 11.5, - Likely related to osteomyelitis - Continue treatment as per osteomyelitis - Trend in the a.m.

## 2022-02-13 NOTE — H&P (Signed)
History and Physical    Patient: Juan Yang DOB: February 26, 1955 DOA: 02/12/2022 DOS: the patient was seen and examined on 02/13/2022 PCP: Baruch Gouty, FNP  Patient coming from: Home  Chief Complaint:  Chief Complaint  Patient presents with   Toe Injury    Left great toe   HPI: Juan Yang is a 67 y.o. 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  Patient reports he had his great toe on his left foot amputated at on 02/04/2022.  On January 1 he started to have erythema at the incision site.  He went to infectious disease and they sent him to the [facility], ER which had a horrendous weight so he decided to come to [facility].  Patient had been discharged previously on p.o. Levaquin and doxycycline.  Apparently at that time they offered to set him up with outpatient IV antibiotics, but patient was not interested.  They then put him on Augmentin which caused him to have a rash, so he was switched to doxycycline and Levaquin.  He reports compliance with this regimen.  He has had no fever.  He reports that his wound has been draining a reports some of it is purulent and some of it is serosanguineous.  He is not sure if the drainage has an odor.  Patient reports he has neuropathy and so he does not feel much in his feet, so it has not been incredibly painful.  Patient denies any recent trauma to the area.  Patient has no other complaints at this time.  Patient does not smoke.  He quit smoking about 10 years ago.  He quit vaping about 2 weeks ago.  He does not drink alcohol, does not use illicit drugs, is not vaccinated for COVID.  Patient is full code. Review of Systems: As mentioned in the history of present illness. All other systems reviewed and are negative. 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   History of kidney stones    Hypertension    Peripheral neuropathy 10/29/2016   Pneumonia    Transient cerebral ischemic attack, unspecified 05/10/2020   stroke   Past Surgical History:  Procedure Laterality Date   BRONCHIAL NEEDLE ASPIRATION BIOPSY  11/29/2021   Procedure: BRONCHIAL NEEDLE ASPIRATION BIOPSIES;  Surgeon: Garner Nash, DO;  Location: [facility];  Service: Pulmonary;;   CHOLECYSTECTOMY N/A 12/13/2021   Procedure: LAPAROSCOPIC CHOLECYSTECTOMY;  Surgeon: Virl Cagey, MD;  [facility];  Service: General;  Laterality: N/A;   COLONOSCOPY WITH PROPOFOL N/A 11/14/2016   Procedure: COLONOSCOPY WITH PROPOFOL;  Surgeon: Rogene Houston, MD;  [facility];  Service: Endoscopy;  Laterality: N/A;  9:15   IR IMAGING GUIDED PORT INSERTION  12/24/2021   LUMBAR LAMINECTOMY/DECOMPRESSION MICRODISCECTOMY Right 05/24/2019   Procedure:  LAMINECTOMY AND FORAMINOTOMY LUMBAR FIVE- SACRAL ONE RIGHT;  Surgeon: Vallarie Mare, MD;  Location: [facility];  Service: Neurosurgery;  Laterality: Right;  posterior/right   LUNG BIOPSY     MOHS SURGERY Right 05/2020   ear   MOHS SURGERY     POLYPECTOMY  11/14/2016   Procedure: POLYPECTOMY- SIGMOID COLON X3 TRANSVERSE COLON X2;  Surgeon: Rogene Houston, MD;  [facility];  Service: Endoscopy;;   rt ankle surgery     Titanium hinge   TONSILLECTOMY     TRANSFORAMINAL LUMBAR INTERBODY FUSION (TLIF) WITH PEDICLE SCREW FIXATION 1 LEVEL N/A 05/02/2020   Procedure: Lumbar five Sacral one Transforaminal  lumbar interbody fusion;  Surgeon: Vallarie Mare, MD;  Location: [place];  Service: Neurosurgery;  Laterality: N/A;   VIDEO BRONCHOSCOPY WITH ENDOBRONCHIAL ULTRASOUND N/A 11/29/2021   Procedure: VIDEO BRONCHOSCOPY WITH ENDOBRONCHIAL ULTRASOUND;  Surgeon: Garner Nash, DO;  Location: [place];  Service: Pulmonary;  Laterality: N/A;   Social History:  reports that he quit smoking about 9 years ago. His smoking use included cigarettes. He has a 30.00 pack-year smoking history. He has never been exposed to tobacco smoke. He has never used smokeless tobacco. He reports that he does not drink alcohol and does not use drugs.  Allergies  Allergen Reactions   Other Hives    DUCK EGGS ONLY   Atenolol Diarrhea and Nausea Only    Pt unsure of allergy? Possible fainting?   Cymbalta [Duloxetine Hcl] Other (See Comments)    Pt states it shut his bladder down and could not urinate; states it made him lethargic as well.   Latex Itching   Penicillins Hives    Did it involve swelling of the face/tongue/throat, SOB, or low BP? Unknown Did it involve sudden or severe rash/hives, skin peeling, or any reaction on the inside of your mouth or nose? Unknown Did you need to seek medical attention at a hospital or doctor's office? yes When did it last happen?      Childhood If all above answers are "NO", may proceed with cephalosporin use.    Family History  Problem Relation Age of Onset   Charcot-Marie-Tooth disease Mother    Heart disease Father    COPD Father    Arthritis Sister     Prior to Admission medications   Medication Sig Start Date End Date Taking? Authorizing Provider  amLODipine (NORVASC) 2.5 MG tablet Take 2.5 mg by mouth 2 (two) times daily. 04/18/19   [provider]  aspirin EC (ADULT ASPIRIN REGIMEN) 81 MG tablet Take 325 mg by mouth daily. 09/06/21   [provider]  calcium carbonate (TUMS - DOSED IN MG ELEMENTAL CALCIUM) 500 MG chewable tablet Chew 2 tablets by  mouth daily.    [provider]  clobetasol cream (TEMOVATE) 4.40 % Apply 1 application topically 2 (two) times daily as needed (rash).    [provider]  cyclobenzaprine (FLEXERIL) 10 MG tablet Take 1 tablet (10 mg total) by mouth 3 (three) times daily as needed for muscle spasms. 05/03/20   Vallarie Mare, MD  doxycycline (VIBRA-TABS) 100 MG tablet Take 1 tablet (100 mg total) by mouth 2 (two) times daily for 16 days. 02/04/22 02/20/22  Mignon Pine, DO  furosemide (LASIX) 40 MG tablet Take 1 tablet (40 mg total) by mouth daily. 12/31/21   Mallipeddi, Vishnu P, MD  Glucosamine HCl-MSM (GLUCOSAMINE-MSM PO) Take 1 tablet by mouth daily.    [provider]  levofloxacin (LEVAQUIN) 750 MG tablet Take 1 tablet (750 mg total) by mouth daily for 14 days. 02/06/22 02/20/22  Mignon Pine, DO  losartan (COZAAR) 25 MG tablet Take 25 mg by mouth daily. 07/23/20   [provider]  metoprolol succinate (TOPROL-XL) 25 MG 24 hr tablet Take 1 tablet (25 mg total) by mouth daily. 10/03/21   Baruch Gouty, FNP  ondansetron (ZOFRAN) 4 MG tablet Take 1 tablet (4 mg total) by mouth every 8 (eight) hours as needed for nausea or vomiting. 12/12/21   Baruch Gouty, FNP  oxyCODONE (OXY IR/ROXICODONE) 5 MG immediate release tablet Take 1-2 tablets (5-10 mg total) by mouth every 4 (four) hours as needed for breakthrough pain or severe pain. 12/15/21   Manuella Ghazi, Pratik D, DO  Potassium 99 MG TABS Take 99 mg by mouth daily as needed (low potassium).    [provider]  rosuvastatin (CRESTOR) 5 MG tablet Take 1 tablet (5 mg total) by mouth daily. 12/31/21   Mallipeddi, Vishnu P, MD  sildenafil (VIAGRA) 100 MG tablet Take 100 mg by mouth daily as needed for erectile dysfunction.    [provider]  sodium bicarbonate 650 MG tablet Take 650 mg by mouth 2 (two) times daily. 11/09/20   [provider]    Physical Exam: Vitals:   02/13/22 0300 02/13/22 0400  02/13/22 0443 02/13/22 0500  BP: (!) 139/90 99/88  (!) 135/96  Pulse: 83 74  78  Resp: 14 14  16   Temp:   97.9 F (36.6 C)   TempSrc:   Oral   SpO2: 97% 100%  100%  Weight:      Height:       1.  General: Patient lying supine in bed,  no acute distress   2. Psychiatric: Alert and oriented x 3, mood and behavior normal for situation, pleasant and cooperative with exam   3. Neurologic: Speech and language are normal, face is symmetric, moves all 4 extremities voluntarily, at baseline without acute deficits on limited exam   4. HEENMT:  Head is atraumatic, normocephalic, pupils reactive to light, neck is supple, trachea is midline, mucous membranes are moist   5. Respiratory : Lungs are clear to auscultation bilaterally without wheezing, rhonchi, rales, no cyanosis, no increase in work of breathing or accessory muscle use   6. Cardiovascular : Heart rate normal, rhythm is regular, murmur present, rubs or gallops, no peripheral edema, peripheral pulses palpated   7. Gastrointestinal:  Abdomen is soft, nondistended, nontender to palpation bowel sounds active, no masses or organomegaly palpated   8. Skin:  Incision site at left great toe amputation is clean, intact, without drainage at this time, the surrounding skin is erythematous and edematous   9.Musculoskeletal:  Recent left great toe amputation with sutures in place, no asymmetry in tone, no peripheral edema, peripheral pulses palpated, no tenderness to palpation in the extremities  Data Reviewed: In the ED Temp 97.5, heart rate 94-118, respiratory rate 18, blood pressure 125/89-130/89, satting at 99% Leukocytosis 13.7, hemoglobin 14.7, platelets 266 Chemistries unremarkable Blood culture pending X-ray foot shows amputation of the partial first digit MR left foot shows concern for osteomyelitis Antibiotic options were discussed with pharmacy and Levaquin and vancomycin was started Patient's lactic acid goes from 1.2>>  1.4 Admission requested for further management of osteomyelitis  Assessment and Plan: * Osteomyelitis ([place]) - MR left foot shows proximal phalanx of the first digit concerning for osteomyelitis - Patient has been  on about 9 days of doxycycline and Levaquin - Pharmacy recommends vancomycin and Levaquin IV - Blood cultures pending - Will need infectious disease consult today - Patient was following with podiatry and infectious disease outpatient  Squamous cell lung cancer, right ([place]) - Recently diagnosed - Has not yet started chemo, as his osteomyelitis is a barrier to starting chemo - Patient has quit tobacco products - Continue to monitor  Chronic diastolic CHF (congestive heart failure) (HCC) - Continue aspirin, Norvasc, losartan, metoprolol - Monitor on telemetry - Clinically no signs of exacerbation at this time - Last echo was in October 2023 showing ejection fraction of 60-65% and grade 1 diastolic dysfunction  Leukocytosis - 13.7 - Likely related to osteomyelitis - Continue treatment as per osteomyelitis - Trend in the a.m.  Essential hypertension - Continue Norvasc, losartan, metoprolol      Advance Care Planning:   Code Status: Full Code  Consults: Will need infectious disease consult  Family Communication: No family at bedside  Severity of Illness: The appropriate patient status for this patient is INPATIENT. Inpatient status is judged to be reasonable and necessary in order to provide the required intensity of service to ensure the patient's safety. The patient's presenting symptoms, physical exam findings, and initial radiographic and laboratory data in the context of their chronic comorbidities is felt to place them at high risk for further clinical deterioration. Furthermore, it is not anticipated that the patient will be medically stable for discharge from the hospital within 2 midnights of admission.   * I certify that at the point of admission it is my  clinical judgment that the patient will require inpatient hospital care spanning beyond 2 midnights from the point of admission due to high intensity of service, high risk for further deterioration and high frequency of surveillance required.*  Author: Rolla Plate, DO 02/13/2022 6:23 AM  For on call review [email or web address].

## 2022-02-13 NOTE — ED Notes (Signed)
Patients wife updated at this time

## 2022-02-13 NOTE — Assessment & Plan Note (Addendum)
-   MR left foot shows proximal phalanx of the first digit concerning for osteomyelitis - Patient has been on about 9 days of doxycycline and Levaquin - Pharmacy recommends vancomycin and Levaquin IV -Consulted infectious disease Dr. Elba Barman who recommended patient to be transferred to [facility] or [facility] - Dr. Amalia Hailey was notified and consulted will see the patient on arrival - Blood cultures pending

## 2022-02-13 NOTE — Assessment & Plan Note (Signed)
-   Recently diagnosed - Has not yet started chemo, as his osteomyelitis is a barrier to starting chemo - Patient has quit tobacco products - Continue to monitor

## 2022-02-13 NOTE — Hospital Course (Addendum)
67 year old man PMH including Charcot-Marie-Tooth, lower extremity neuropathy, recently underwent left great toe amputation 12/26.  Developed erythema January 1, seen by infectious disease January 3 and referred to the emergency department for MRI.  Imaging revealed osteomyelitis the patient was eventually admitted for further evaluation and treatment for osteomyelitis, infectious disease and podiatry consultations.

## 2022-02-13 NOTE — ED Notes (Signed)
Pt resting, NAD noted, observed even RR and unlabored, side rails up x1 for safety, plan of care ongoing, pt expresses no needs or concerns at this time, call light within reach, no further concerns as of present. Pt given ginger ale to drink

## 2022-02-13 NOTE — Assessment & Plan Note (Addendum)
-   Continue aspirin, Norvasc, losartan, metoprolol - Monitor on telemetry - Clinically no signs of exacerbation at this time - Last echo was in October 2023 showing ejection fraction of 60-65% and grade 1 diastolic dysfunction -Judicial fluid resuscitation

## 2022-02-14 ENCOUNTER — Encounter: Payer: Medicare Other | Admitting: Podiatry

## 2022-02-14 ENCOUNTER — Inpatient Hospital Stay (HOSPITAL_COMMUNITY): Payer: PPO

## 2022-02-14 DIAGNOSIS — I10 Essential (primary) hypertension: Secondary | ICD-10-CM | POA: Diagnosis not present

## 2022-02-14 DIAGNOSIS — M86672 Other chronic osteomyelitis, left ankle and foot: Secondary | ICD-10-CM

## 2022-02-14 DIAGNOSIS — M86172 Other acute osteomyelitis, left ankle and foot: Secondary | ICD-10-CM | POA: Diagnosis not present

## 2022-02-14 DIAGNOSIS — L0390 Cellulitis, unspecified: Secondary | ICD-10-CM | POA: Diagnosis not present

## 2022-02-14 DIAGNOSIS — I5032 Chronic diastolic (congestive) heart failure: Secondary | ICD-10-CM | POA: Diagnosis not present

## 2022-02-14 LAB — BASIC METABOLIC PANEL
Anion gap: 7 (ref 5–15)
BUN: 17 mg/dL (ref 8–23)
CO2: 24 mmol/L (ref 22–32)
Calcium: 8.4 mg/dL — ABNORMAL LOW (ref 8.9–10.3)
Chloride: 107 mmol/L (ref 98–111)
Creatinine, Ser: 1.2 mg/dL (ref 0.61–1.24)
GFR, Estimated: >60 mL/min (ref >60)
Glucose, Bld: 104 mg/dL — ABNORMAL HIGH (ref 70–99)
Potassium: 3.8 mmol/L (ref 3.5–5.1)
Sodium: 138 mmol/L (ref 135–145)

## 2022-02-14 LAB — CBC
HCT: 42.9 % (ref 39.0–52.0)
Hemoglobin: 14 g/dL (ref 13.0–17.0)
MCH: 30 pg (ref 26.0–34.0)
MCHC: 32.6 g/dL (ref 30.0–36.0)
MCV: 92.1 fL (ref 80.0–100.0)
Platelets: 242 10*3/uL (ref 150–400)
RBC: 4.66 MIL/uL (ref 4.22–5.81)
RDW: 13.3 % (ref 11.5–15.5)
WBC: 11.7 10*3/uL — ABNORMAL HIGH (ref 4.0–10.5)
nRBC: 0 % (ref 0.0–0.2)

## 2022-02-14 MED ORDER — CALCIUM CARBONATE ANTACID 500 MG PO CHEW
1.0000 | CHEWABLE_TABLET | Freq: Two times a day (BID) | ORAL | Status: DC
  Administered 2022-02-14 – 2022-02-19 (×11): 200 mg via ORAL
  Filled 2022-02-14 (×11): qty 1

## 2022-02-14 MED ORDER — OXYCODONE HCL 5 MG PO TABS
5.0000 mg | ORAL_TABLET | Freq: Four times a day (QID) | ORAL | Status: DC | PRN
  Administered 2022-02-15: 10 mg via ORAL
  Administered 2022-02-15: 5 mg via ORAL
  Administered 2022-02-16 – 2022-02-19 (×10): 10 mg via ORAL
  Filled 2022-02-14 (×3): qty 2
  Filled 2022-02-14: qty 1
  Filled 2022-02-14 (×8): qty 2

## 2022-02-14 MED ORDER — MORPHINE SULFATE (PF) 2 MG/ML IV SOLN: 2.0000 mg | INTRAVENOUS | Status: DC | PRN

## 2022-02-14 MED ORDER — VANCOMYCIN HCL 2000 MG/400ML IV SOLN
2000.0000 mg | INTRAVENOUS | Status: DC
  Administered 2022-02-14 – 2022-02-19 (×5): 2000 mg via INTRAVENOUS
  Filled 2022-02-14 (×5): qty 400

## 2022-02-14 MED ORDER — FUROSEMIDE 40 MG PO TABS
40.0000 mg | ORAL_TABLET | Freq: Every day | ORAL | Status: DC
  Administered 2022-02-14 – 2022-02-19 (×6): 40 mg via ORAL
  Filled 2022-02-14 (×7): qty 1

## 2022-02-14 NOTE — Plan of Care (Signed)

## 2022-02-14 NOTE — Progress Notes (Signed)
Pharmacy Antibiotic Note  Juan Yang is a 67 y.o. male admitted on 02/12/2022 with  osteomyelitis .  Pharmacy has been consulted for vancomycin dosing. 02/14/2022 WBC 13.7>> 11.7 SCr 1.36>> 1.2 Afebrile Plan: Change to Vancomycin 2000mg  IV Q24H. Goal AUC 400-550.  Expected AUC 510.8 using SCr 1.2, Vd 0.72, Est Css min 10.8 Continue Levaquin 750 mg IV q24 per MD F/u ID & podiatry plans  Height: 6' (182.9 cm) Weight: 91.3 kg (201 lb 4.5 oz) IBW/kg (Calculated) : 77.6  Temp (24hrs), Avg:98.1 F (36.7 C), Min:97.4 F (36.3 C), Max:98.9 F (37.2 C)  Recent Labs  Lab 02/12/22 2000 02/12/22 2007 02/13/22 0743 02/14/22 0541  WBC  --  13.7* 11.5* 11.7*  CREATININE  --  1.36* 1.27* 1.20  LATICACIDVEN 1.4  --   --   --      Estimated Creatinine Clearance: 66.5 mL/min (by C-G formula based on SCr of 1.2 mg/dL).    Allergies  Allergen Reactions   Other Hives    DUCK EGGS ONLY   Atenolol Diarrhea and Nausea Only    Pt unsure of allergy? Possible fainting?   Cymbalta [Duloxetine Hcl] Other (See Comments)    Pt states it shut his bladder down and could not urinate; states it made him lethargic as well.   Latex Itching   Penicillins Hives    Did it involve swelling of the face/tongue/throat, SOB, or low BP? Unknown Did it involve sudden or severe rash/hives, skin peeling, or any reaction on the inside of your mouth or nose? Unknown Did you need to seek medical attention at a hospital or doctor's office? yes When did it last happen?      Childhood If all above answers are "NO", may proceed with cephalosporin use.     Thank you for allowing pharmacy to be a part of this patient's care.  Eudelia Bunch, Pharm.D Use secure chat for questions 02/14/2022 12:25 PM

## 2022-02-14 NOTE — Progress Notes (Signed)
  Progress Note   Patient: Juan Yang LTJ:[id number] DOB: [birth year] DOA: 02/12/2022     1 DOS: the patient was seen and examined on 02/14/2022   Brief hospital course: 67 year old man PMH including Charcot-Marie-Tooth, lower extremity neuropathy, recently underwent left great toe amputation 12/26.  Developed erythema January 1, seen by infectious disease January 3 and referred to the emergency department for MRI.  Imaging revealed osteomyelitis the patient was eventually admitted for further evaluation and treatment for osteomyelitis, infectious disease and podiatry consultations.  Assessment and Plan: * Osteomyelitis left foot (HCC) MR left foot showed proximal phalanx of the first digit concerning for osteomyelitis WBC 11.7, no significant change BC NGTD Continue antibiotics, follow-up infectious disease and podiatry recommendations.  Dehydration Creatinine down to 1.2, appears to be at baseline  Chronic diastolic CHF (congestive heart failure) (HCC) Appears stable. Continue aspirin, Norvasc, metoprolol No need for telemetry  Essential hypertension Stable. Continue Norvasc, losartan, metoprolol  Squamous cell lung cancer, right ([place]) Recently diagnosed, has not yet started chemo, as infection has been a barrier to starting chemo  Subjective:  Feels ok  Physical Exam: Vitals:   02/14/22 0121 02/14/22 0524 02/14/22 1004 02/14/22 1417  BP: 106/74 118/73 (!) 123/96 (!) 126/95  Pulse: 73 68 88 81  Resp: 14 16 15 17   Temp: 97.8 F (36.6 C) 98.9 F (37.2 C) 98.1 F (36.7 C) 97.9 F (36.6 C)  TempSrc: Oral  Oral Oral  SpO2: 100% 100% 98% 97%  Weight:      Height:       Physical Exam Vitals reviewed.  Constitutional:      General: He is not in acute distress.    Appearance: He is not ill-appearing or toxic-appearing.  Cardiovascular:     Rate and Rhythm: Normal rate and regular rhythm.     Heart sounds: No murmur heard. Pulmonary:     Effort: Pulmonary effort is  normal. No respiratory distress.     Breath sounds: No wheezing, rhonchi or rales.  Musculoskeletal:     Right lower leg: No edema.     Left lower leg: No edema.     Comments: Left foot dressed  Neurological:     Mental Status: He is alert.  Psychiatric:        Mood and Affect: Mood normal.        Behavior: Behavior normal.   Data Reviewed: Creatinine down to 1.2, appears to be at baseline WBC 11.7 BC NGTD  Family Communication: none  Disposition: Status is: Inpatient Remains inpatient appropriate because: left foot osteomyelitis  Planned Discharge Destination: Home    Time spent: 20 minutes  Author: Murray Hodgkins, MD 02/14/2022 2:26 PM  For on call review [email or web address].

## 2022-02-14 NOTE — Consult Note (Signed)
Subjective:  Patient ID: Juan Yang, male    DOB: [birth year],  MRN: [id number]  Patient with past medical history of CMT, lower extremity neuropathy, CKD and recent diagnosis of lung cancer  seen at beside today for concern of left great toe infection following previous partial great toe amputation on 12/26 with Dr. Amalia Hailey. Relates on Monday he started noticing some pain and redness in the toe and was seen by ID on 1/3. He was subsequently advised to go to the ED for admission and IV antibiotics and possible surgical intervention. Currently he has been on IV vancomycin and levaquin. Relates today at bedside improvement in the look and feel of his foot.    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   History of kidney stones    Hypertension    Peripheral neuropathy 10/29/2016   Pneumonia    Transient cerebral ischemic attack, unspecified 05/10/2020   stroke     Past Surgical History:  Procedure Laterality Date   BRONCHIAL NEEDLE ASPIRATION BIOPSY  11/29/2021   Procedure: BRONCHIAL NEEDLE ASPIRATION BIOPSIES;  Surgeon: Garner Nash, DO;  Location: [facility];  Service: Pulmonary;;   CHOLECYSTECTOMY N/A 12/13/2021   Procedure: LAPAROSCOPIC CHOLECYSTECTOMY;  Surgeon: Virl Cagey, MD;  [facility];  Service: General;  Laterality: N/A;   COLONOSCOPY WITH PROPOFOL N/A 11/14/2016   Procedure: COLONOSCOPY WITH PROPOFOL;  Surgeon: Rogene Houston, MD;  [facility];  Service: Endoscopy;  Laterality: N/A;  9:15   IR IMAGING GUIDED PORT INSERTION  12/24/2021    LUMBAR LAMINECTOMY/DECOMPRESSION MICRODISCECTOMY Right 05/24/2019   Procedure: LAMINECTOMY AND FORAMINOTOMY LUMBAR FIVE- SACRAL ONE RIGHT;  Surgeon: Vallarie Mare, MD;  Location: [place];  Service: Neurosurgery;  Laterality: Right;  posterior/right   LUNG BIOPSY     MOHS SURGERY Right 05/2020   ear   MOHS SURGERY     POLYPECTOMY  11/14/2016   Procedure: POLYPECTOMY- SIGMOID COLON X3 TRANSVERSE COLON X2;  Surgeon: Rogene Houston, MD;  [facility];  Service: Endoscopy;;   rt ankle surgery     Titanium hinge   TONSILLECTOMY     TRANSFORAMINAL LUMBAR INTERBODY FUSION (TLIF) WITH PEDICLE SCREW FIXATION 1 LEVEL N/A 05/02/2020   Procedure: Lumbar five Sacral one Transforaminal lumbar interbody fusion;  Surgeon: Vallarie Mare, MD;  Location: [place];  Service: Neurosurgery;  Laterality: N/A;   VIDEO BRONCHOSCOPY WITH ENDOBRONCHIAL ULTRASOUND N/A 11/29/2021   Procedure: VIDEO BRONCHOSCOPY WITH ENDOBRONCHIAL ULTRASOUND;  Surgeon: Garner Nash, DO;  Location: [place];  Service: Pulmonary;  Laterality: N/A;       Latest Ref Rng & Units 02/14/2022    5:41 AM 02/13/2022    7:43 AM 02/12/2022    8:07 PM  CBC  WBC 4.0 - 10.5 K/uL 11.7  11.5  13.7   Hemoglobin 13.0 - 17.0 g/dL 14.0  13.7  14.7   Hematocrit 39.0 - 52.0 % 42.9  40.2  45.0   Platelets 150 - 400 K/uL 242  249  266        Latest Ref Rng & Units 02/14/2022    5:41 AM 02/13/2022    7:43 AM 02/12/2022    8:07 PM  BMP  Glucose 70 - 99 mg/dL 104  116  94   BUN 8 - 23 mg/dL 17  23  28    Creatinine 0.61 - 1.24 mg/dL 1.20  1.27  1.36   Sodium 135 - 145 mmol/L 138  136  136   Potassium 3.5 - 5.1 mmol/L 3.8  3.5  3.6   Chloride 98 - 111 mmol/L 107  102  100   CO2 22 - 32 mmol/L 24  26  26    Calcium 8.9 - 10.3 mg/dL 8.4  8.5  9.2      Objective:   Vitals:   02/14/22 1004 02/14/22 1417  BP: (!) 123/96 (!) 126/95  Pulse: 88 81  Resp: 15 17  Temp: 98.1 F (36.7 C) 97.9 F (36.6 C)  SpO2: 98% 97%     General:AA&O x 3. Normal mood and affect   Vascular: DP and PT pulses 2/4 bilateral. Brisk capillary refill to all digits. Pedal hair present   Neruological. Epicritic sensation grossly intact.   Derm: Distal incision to left hallux well copated without signs of dehiscence. Some mild erythema perincision but appears to have improved from previous images in chart. No drainage noted. No malodor Interspaces clears of maceration. Nails well groomed and normal in appearance  MSK: MMT 5/5 in dorsiflexion, plantar flexion, inversion and eversion. Normal joint ROM without pain or crepitus.   MRI left foot.  IMPRESSION: 1. Bone marrow edema about the amputation site of the residual proximal phalanx of the first digit concerning for osteomyelitis. 2. Skin wound about the amputation site with subjacent edema and inflammatory changes. No fluid collection or abscess. 3. Increased intrasubstance signal of the plantar muscles suggesting diabetic myopathy/myositis.    Assessment & Plan:  Patient was evaluated and treated and all questions answered.  DX: s/p left hallux partial amputation with residual cellulitis  Wound care: betadine, DSD daily  Antibiotics: Per ID. Currently on IV levaquin and vancomycin. Appreciate their expertise.  DME: post-op shoe  Discussed with patient diagnosis and treatment options.  Imaging reviewed. MRI from 1/3 does show bone marrow edema and relates concern for osteomyelitis.  Discussed with patient based on improvement in toe after one day of IV antibiotics feel likely he should continue to improve on regimen. Discussed that bone marrow edema can also be seen after surgery and cutting of bone and likely this is more cellulitis at this point than concern for osteomyelitis.  Will continue to keep an eye on the foot and if  does not continue to show improvement than further amputation may be warranted then but at this point no plan for OR.  Patient in agreement with  plan and all questions answered.  Podiatry will continue to follow.   Lorenda Peck, DPM  Accessible via secure chat for questions or concerns.

## 2022-02-14 NOTE — Progress Notes (Signed)
Mobility Specialist - Progress Note   02/14/22 0933  Mobility  Activity Ambulated with assistance in hallway  Level of Assistance Modified independent, requires aide device or extra time  Assistive Device  (IV Polr)  Distance Ambulated (ft) 500 ft  Activity Response Tolerated well  Mobility Referral Yes  $Mobility charge 1 Mobility   Pt received in bed and agreeable to mobility. 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 No other complaints during mobility. Pt to bed after session with all needs met.    [facility]

## 2022-02-14 NOTE — Progress Notes (Signed)
ABI's have been completed. Preliminary results can be found in CV Proc through chart review.   02/14/22 11:52 AM Carlos Levering RVT

## 2022-02-15 DIAGNOSIS — C3491 Malignant neoplasm of unspecified part of right bronchus or lung: Secondary | ICD-10-CM | POA: Diagnosis not present

## 2022-02-15 DIAGNOSIS — M86172 Other acute osteomyelitis, left ankle and foot: Secondary | ICD-10-CM | POA: Diagnosis not present

## 2022-02-15 DIAGNOSIS — T7840XA Allergy, unspecified, initial encounter: Secondary | ICD-10-CM | POA: Diagnosis not present

## 2022-02-15 DIAGNOSIS — I10 Essential (primary) hypertension: Secondary | ICD-10-CM | POA: Diagnosis not present

## 2022-02-15 DIAGNOSIS — I5032 Chronic diastolic (congestive) heart failure: Secondary | ICD-10-CM | POA: Diagnosis not present

## 2022-02-15 DIAGNOSIS — Z87891 Personal history of nicotine dependence: Secondary | ICD-10-CM

## 2022-02-15 LAB — BASIC METABOLIC PANEL
Anion gap: 8 (ref 5–15)
BUN: 17 mg/dL (ref 8–23)
CO2: 24 mmol/L (ref 22–32)
Calcium: 9 mg/dL (ref 8.9–10.3)
Chloride: 104 mmol/L (ref 98–111)
Creatinine, Ser: 1.21 mg/dL (ref 0.61–1.24)
GFR, Estimated: >60 mL/min (ref >60)
Glucose, Bld: 118 mg/dL — ABNORMAL HIGH (ref 70–99)
Potassium: 3.7 mmol/L (ref 3.5–5.1)
Sodium: 136 mmol/L (ref 135–145)

## 2022-02-15 MED ORDER — CEFAZOLIN SODIUM-DEXTROSE 2-4 GM/100ML-% IV SOLN
2.0000 g | Freq: Three times a day (TID) | INTRAVENOUS | Status: DC
  Administered 2022-02-15 – 2022-02-19 (×12): 2 g via INTRAVENOUS
  Filled 2022-02-15 (×11): qty 100

## 2022-02-15 NOTE — Consult Note (Signed)
Date of Admission:  02/12/2022          Reason for Consult: soft tissue infection at amputation site vs osteomyelitis   Referring Provider: Carlean Jews, MD   Assessment:  Infection at amputation site which may be only soft tissue but could also represent residual osteomyelitis is postoperative findings in the bone after amputation Allergic reaction to Augmentin Squamous cell carcinoma of the lung Neuropathy   Plan:  Change antibiotics to vancomycin and cefazolin for now Follow clinically and as mentioned would likely transition to over to oral antibiotics. The need for him to have his squamous cell carcinoma of the lung treated with chemotherapy we should have a very low threshold for him to have a proximal amputation for cure in case this is osteomyelitis though currently podiatry do not think so.  Principal Problem:   Osteomyelitis ([place]) Active Problems:   Essential hypertension   Leukocytosis   Chronic diastolic CHF (congestive heart failure) (HCC)   Squamous cell lung cancer, right (HCC)   Scheduled Meds:  amLODipine  2.5 mg Oral BID   aspirin EC  325 mg Oral Daily   calcium carbonate  1 tablet Oral BID   furosemide  40 mg Oral Daily   heparin  5,000 Units Subcutaneous Q8H   metoprolol succinate  25 mg Oral Daily   rosuvastatin  5 mg Oral Daily   Continuous Infusions:   ceFAZolin (ANCEF) IV 2 g (02/15/22 1335)   vancomycin 2,000 mg (02/14/22 2330)   PRN Meds:.acetaminophen **OR** acetaminophen, morphine injection, ondansetron **OR** ondansetron (ZOFRAN) IV, oxyCODONE  HPI: Juan Yang is a 67 y.o. male with history of Charcot-Marie-Tooth disorder lower extremity neuropathy newly diagnosed squamous cell carcinoma of the lung who had developed osteomyelitis in his left great toe.  He was seen by my partner Dr. Juleen China ultimately patient underwent partial toe amputation of left great toe.  He had been prescribed doxycycline and Augmentin.  He did  have a remote history of hives as a child to penicillin but had reportedly tolerated Augmentin recently.  When he started the Augmentin and doxycycline he developed intense itching and he began believe the beginnings of hives and stopped the Augmentin.  He then was on Augmentin and levofloxacin.  Note no cultures were taken in the operating room when he had the amputation.    Seen by my partner Dr. Candiss Norse on the January with worsening redness the amputation site.  Instructed to go to the emergency room for admission.  He went to the [facility] emergency room initially but the wait was 18 hours so he opted to go instead to [facility] where he had to wait for 8 hours prior to admission he was admitted to any [facility]   Blood cultures were taken and he was started on vancomycin and levofloxacin with improvement in the drainage and also the appearance of his wound.  MRI of the foot was obtained which showed no marrow edema around the amputation site of the residual proximal phalanx and first digit which could be concerning for osteomyelitis.  Patient is been seen by podiatry who feel that the findings on MRI are more likely postoperative in nature rather than osteomyelitis.  He has been responding to antibiotics at present.  The MRI finding is difficult to pin down on either postoperative finding versus actual residual osteomyelitis.  I would say that since his chemotherapy has been on hold due to him having an infection that we  should have a low threshold for curative amputation more proximally if he indeed does have osteomyelitis I am going to change him over to cefazolin and vancomycin to reduce risk of C. difficile colitis.  I think he will not have any problems tolerating cefazolin he has had trouble tolerating oral Keflex GI upset but no allergic symptoms.  If he does well on the current regimen of propose switching him over to oral antibiotics likely doxycycline plus cefadroxil if he can tolerate  this.  The question then would be how long to treat the patient I generally would favor treating him for 6 weeks with oral antibiotics in case he does have osteomyelitis.  I really do not want him to have his chemotherapy delayed further though so this complicates some of the decision making.  I spent 84 minutes with the patient including than 50% of the time in face to face counseling of the patient and his osteomyelitis his potential osteomyelitis versus amputation site soft tissue infection, lung cancer allergic reaction to Augmentin, personally I informed the foot MRI of the foot reviewing along with review of medical records in preparation for the visit and during the visit and in coordination of nis care.        Review of Systems: Review of Systems  Constitutional:  Negative for chills, fever, malaise/fatigue and weight loss.  HENT:  Negative for congestion and sore throat.   Eyes:  Negative for blurred vision and photophobia.  Respiratory:  Negative for cough, shortness of breath and wheezing.   Cardiovascular:  Negative for chest pain, palpitations and leg swelling.  Gastrointestinal:  Negative for abdominal pain, blood in stool, constipation, diarrhea, heartburn, melena, nausea and vomiting.  Genitourinary:  Negative for dysuria, flank pain and hematuria.  Musculoskeletal:  Positive for myalgias. Negative for back pain, falls and joint pain.  Skin:  Negative for itching and rash.  Neurological:  Positive for weakness. Negative for dizziness, focal weakness, loss of consciousness and headaches.  Endo/Heme/Allergies:  Does not bruise/bleed easily.  Psychiatric/Behavioral:  Negative for depression and suicidal ideas. The patient does not have insomnia.     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   History of kidney stones    Hypertension    Peripheral neuropathy 10/29/2016   Pneumonia    Transient cerebral ischemic attack, unspecified 05/10/2020   stroke    Social History   Tobacco Use   Smoking status: Former    Packs/day: 1.50    Years: 20.00    Total pack years: 30.00    Types: Cigarettes    Quit date: 11/12/2012    Years since quitting: 9.2    Passive exposure: Never   Smokeless tobacco: Never   Tobacco comments:    Pt smokes 1 pack a month and vapes CBD oil. ARJ 11/22/21  Vaping Use   Vaping Use: Former   Quit date: 12/18/2021   Substances: CBD  Substance Use Topics   Alcohol  use: No   Drug use: No    Family History  Problem Relation Age of Onset   Charcot-Marie-Tooth disease Mother    Heart disease Father    COPD Father    Arthritis Sister    Allergies  Allergen Reactions   Other Hives    DUCK EGGS ONLY   Atenolol Diarrhea and Nausea Only    Pt unsure of allergy? Possible fainting?   Cymbalta [Duloxetine Hcl] Other (See Comments)    Pt states it shut his bladder down and could not urinate; states it made him lethargic as well.   Latex Itching   Penicillins Hives    Did it involve swelling of the face/tongue/throat, SOB, or low BP? Unknown Did it involve sudden or severe rash/hives, skin peeling, or any reaction on the inside of your mouth or nose? Unknown Did you need to seek medical attention at a hospital or doctor's office? yes When did it last happen?      Childhood If all above answers are "NO", may proceed with cephalosporin use.    OBJECTIVE: Blood pressure 132/87, pulse 81, temperature 98.7 F (37.1 C), temperature source Oral, resp. rate 18, height 6' (1.829 m), weight 91.3 kg, SpO2 100 %.  Physical  Exam Constitutional:      Appearance: He is well-developed.  HENT:     Head: Normocephalic and atraumatic.  Eyes:     Conjunctiva/sclera: Conjunctivae normal.  Cardiovascular:     Rate and Rhythm: Normal rate and regular rhythm.  Pulmonary:     Effort: Pulmonary effort is normal. No respiratory distress.     Breath sounds: No wheezing.  Abdominal:     General: There is no distension.     Palpations: Abdomen is soft.  Musculoskeletal:        General: No tenderness. Normal range of motion.     Cervical back: Normal range of motion and neck supple.  Skin:    General: Skin is warm and dry.     Coloration: Skin is not pale.     Findings: No erythema or rash.  Neurological:     General: No focal deficit present.     Mental Status: He is alert and oriented to person, place, and time.  Psychiatric:        Mood and Affect: Mood normal.        Behavior: Behavior normal.        Thought Content: Thought content normal.        Judgment: Judgment normal.     Lab Results Lab Results  Component Value Date   WBC 11.7 (H) 02/14/2022   HGB 14.0 02/14/2022   HCT 42.9 02/14/2022   MCV 92.1 02/14/2022   PLT 242 02/14/2022    Lab Results  Component Value Date   CREATININE 1.21 02/15/2022   BUN 17 02/15/2022   NA 136 02/15/2022   K 3.7 02/15/2022   CL 104 02/15/2022   CO2 24 02/15/2022    Lab Results  Component Value Date   ALT 12 02/13/2022   AST 16 02/13/2022   ALKPHOS 115 02/13/2022   BILITOT 0.8 02/13/2022     Microbiology: Recent Results (from the past 240 hour(s))  Blood culture (routine x 2)     Status: None (Preliminary result)   Collection Time: 02/12/22  8:07 PM   Specimen: Left Antecubital; Blood  Result Value Ref Range Status   Specimen Description   Final    LEFT ANTECUBITAL BOTTLES DRAWN AEROBIC AND ANAEROBIC  Special Requests Blood Culture adequate volume  Final   Culture   Final    NO GROWTH 3 DAYS Performed at [facility], [address],  [place], [place] [address]    Report Status PENDING  Incomplete  Blood culture (routine x 2)     Status: None (Preliminary result)   Collection Time: 02/12/22  8:14 PM   Specimen: Right Antecubital; Blood  Result Value Ref Range Status   Specimen Description   Final    RIGHT ANTECUBITAL BOTTLES DRAWN AEROBIC AND ANAEROBIC   Special Requests Blood Culture adequate volume  Final   Culture   Final    NO GROWTH 3 DAYS Performed at [facility], [address] [place],  [address]    Report Status PENDING  Incomplete    Alcide Evener, MD [facility] [phone] pager  02/15/2022, 2:59 PM

## 2022-02-15 NOTE — Progress Notes (Signed)
  Progress Note   Patient: Juan Yang [id number]:[id number] DOB: January 05, 1956 DOA: 02/12/2022     2 DOS: the patient was seen and examined on 02/15/2022   Brief hospital course: 67 year old man PMH including Charcot-Marie-Tooth, lower extremity neuropathy, recently underwent left great toe amputation 12/26.  Developed erythema January 1, seen by infectious disease January 3 and referred to the emergency department for MRI.  Imaging revealed osteomyelitis the patient was eventually admitted for further evaluation and treatment for osteomyelitis, infectious disease and podiatry consultations.  Assessment and Plan: * Osteomyelitis left foot ([place]) MR left foot showed proximal phalanx of the first digit concerning for osteomyelitis Seen by podiatry 1/5, thought to be bone marrow edema more likely than osteo, plan is for observation for now. Will continue abx and follow-up ID recommendations. BC NGTD   Dehydration Creatinine down to 1.2, appears to be at baseline   Chronic diastolic CHF (congestive heart failure) (HCC) Continue metoprolol, furosemide   Essential hypertension Stable. Continue Norvasc, metoprolol   Squamous cell lung cancer, right ([place]) Recently diagnosed, has not yet started chemo, as infection has been a barrier to starting chemo      Subjective:  Feels ok today Not much pain in foot unless he bumps it.  Physical Exam: Vitals:   02/14/22 1004 02/14/22 1417 02/14/22 1920 02/15/22 0531  BP: (!) 123/96 (!) 126/95 (!) 132/91 (!) 137/93  Pulse: 88 81 73 65  Resp: 15 17 17 19   Temp: 98.1 F (36.7 C) 97.9 F (36.6 C) 98 F (36.7 C) 97.8 F (36.6 C)  TempSrc: Oral Oral Oral   SpO2: 98% 97% 98% 99%  Weight:      Height:       Physical Exam Vitals reviewed.  Constitutional:      General: He is not in acute distress.    Appearance: He is not ill-appearing or toxic-appearing.  Cardiovascular:     Rate and Rhythm: Normal rate and regular rhythm.     Heart sounds: No  murmur heard. Pulmonary:     Effort: Pulmonary effort is normal. No respiratory distress.     Breath sounds: No wheezing, rhonchi or rales.  Skin:    Comments: Left foot dressed.  Neurological:     Mental Status: He is alert.  Psychiatric:        Mood and Affect: Mood normal.        Behavior: Behavior normal.    Data Reviewed: BMP noted BC NGTD  Family Communication: none  Disposition: Status is: Inpatient Remains inpatient appropriate because: foot infection  Planned Discharge Destination: Home    Time spent: 20 minutes  Author: Murray Hodgkins, MD 02/15/2022 8:32 AM  For on call review [email or web address].

## 2022-02-16 DIAGNOSIS — Z87891 Personal history of nicotine dependence: Secondary | ICD-10-CM | POA: Diagnosis not present

## 2022-02-16 DIAGNOSIS — M86172 Other acute osteomyelitis, left ankle and foot: Secondary | ICD-10-CM | POA: Diagnosis not present

## 2022-02-16 DIAGNOSIS — I5032 Chronic diastolic (congestive) heart failure: Secondary | ICD-10-CM | POA: Diagnosis not present

## 2022-02-16 DIAGNOSIS — C3491 Malignant neoplasm of unspecified part of right bronchus or lung: Secondary | ICD-10-CM | POA: Diagnosis not present

## 2022-02-16 LAB — BASIC METABOLIC PANEL
Anion gap: 8 (ref 5–15)
BUN: 17 mg/dL (ref 8–23)
CO2: 24 mmol/L (ref 22–32)
Calcium: 8.5 mg/dL — ABNORMAL LOW (ref 8.9–10.3)
Chloride: 104 mmol/L (ref 98–111)
Creatinine, Ser: 1.35 mg/dL — ABNORMAL HIGH (ref 0.61–1.24)
GFR, Estimated: 58 mL/min — ABNORMAL LOW (ref >60)
Glucose, Bld: 114 mg/dL — ABNORMAL HIGH (ref 70–99)
Potassium: 3.5 mmol/L (ref 3.5–5.1)
Sodium: 136 mmol/L (ref 135–145)

## 2022-02-16 NOTE — Progress Notes (Signed)
Subjective: No new complaints   Antibiotics:  Anti-infectives (From admission, onward)    Start     Dose/Rate Route Frequency Ordered Stop   02/15/22 1400  ceFAZolin (ANCEF) IVPB 2g/100 mL premix        2 g 200 mL/hr over 30 Minutes Intravenous Every 8 hours 02/15/22 1000     02/15/22 0000  vancomycin (VANCOREADY) IVPB 2000 mg/400 mL        2,000 mg 200 mL/hr over 120 Minutes Intravenous Every 24 hours 02/14/22 1224     02/14/22 0800  levofloxacin (LEVAQUIN) IVPB 750 mg  Status:  Discontinued        750 mg 100 mL/hr over 90 Minutes Intravenous Every 24 hours 02/13/22 0729 02/15/22 0951   02/14/22 0000  vancomycin (VANCOREADY) IVPB 1750 mg/350 mL  Status:  Discontinued        1,750 mg 175 mL/hr over 120 Minutes Intravenous Every 24 hours 02/13/22 0206 02/14/22 1224   02/13/22 0800  levofloxacin (LEVAQUIN) IVPB 750 mg        750 mg 100 mL/hr over 90 Minutes Intravenous  Once 02/13/22 0137 02/13/22 0854   02/13/22 0200  vancomycin (VANCOREADY) IVPB 2000 mg/400 mL        2,000 mg 200 mL/hr over 120 Minutes Intravenous  Once 02/13/22 0137 02/13/22 0400       Medications: Scheduled Meds:  amLODipine  2.5 mg Oral BID   aspirin EC  325 mg Oral Daily   calcium carbonate  1 tablet Oral BID   furosemide  40 mg Oral Daily   heparin  5,000 Units Subcutaneous Q8H   metoprolol succinate  25 mg Oral Daily   rosuvastatin  5 mg Oral Daily   Continuous Infusions:   ceFAZolin (ANCEF) IV 2 g (02/16/22 1335)   vancomycin 2,000 mg (02/15/22 2304)   PRN Meds:.acetaminophen **OR** acetaminophen, ondansetron **OR** ondansetron (ZOFRAN) IV, oxyCODONE    Objective: Weight change:   Intake/Output Summary (Last 24 hours) at 02/16/2022 1354 Last data filed at 02/16/2022 1300 Gross per 24 hour  Intake 1560 ml  Output --  Net 1560 ml   Blood pressure (!) 130/92, pulse 78, temperature 97.7 F (36.5 C), temperature source Oral, resp. rate 20, height 6' (1.829 m), weight 91.3 kg,  SpO2 100 %. Temp:  [97.6 F (36.4 C)-97.7 F (36.5 C)] 97.7 F (36.5 C) (01/07 1329) Pulse Rate:  [74-78] 78 (01/07 1329) Resp:  [16-20] 20 (01/07 1329) BP: (119-130)/(83-92) 130/92 (01/07 1329) SpO2:  [97 %-100 %] 100 % (01/07 1329)  Physical Exam: Physical Exam Constitutional:      Appearance: He is well-developed.  HENT:     Head: Normocephalic and atraumatic.  Eyes:     Conjunctiva/sclera: Conjunctivae normal.  Cardiovascular:     Rate and Rhythm: Normal rate and regular rhythm.  Pulmonary:     Effort: Pulmonary effort is normal. No respiratory distress.     Breath sounds: Normal breath sounds. No stridor. No wheezing.  Abdominal:     General: There is no distension.     Palpations: Abdomen is soft.  Musculoskeletal:     Cervical back: Normal range of motion and neck supple.  Skin:    General: Skin is warm and dry.  Neurological:     General: No focal deficit present.     Mental Status: He is alert and oriented to person, place, and time.  Psychiatric:        Mood and Affect:  Mood normal.        Behavior: Behavior normal.        Thought Content: Thought content normal.        Judgment: Judgment normal.     Foot bandaged  CBC:    BMET Recent Labs    02/15/22 0605 02/16/22 0548  NA 136 136  K 3.7 3.5  CL 104 104  CO2 24 24  GLUCOSE 118* 114*  BUN 17 17  CREATININE 1.21 1.35*  CALCIUM 9.0 8.5*     Liver Panel  No results for input(s): "PROT", "ALBUMIN", "AST", "ALT", "ALKPHOS", "BILITOT", "BILIDIR", "IBILI" in the last 72 hours.     Sedimentation Rate No results for input(s): "ESRSEDRATE" in the last 72 hours. C-Reactive Protein No results for input(s): "CRP" in the last 72 hours.  Micro Results: Recent Results (from the past 720 hour(s))  Blood culture (routine x 2)     Status: None (Preliminary result)   Collection Time: 02/12/22  8:07 PM   Specimen: Left Antecubital; Blood  Result Value Ref Range Status   Specimen Description   Final     LEFT ANTECUBITAL BOTTLES DRAWN AEROBIC AND ANAEROBIC   Special Requests Blood Culture adequate volume  Final   Culture   Final    NO GROWTH 4 DAYS Performed at [facility], [address] [place], [place] [address]    Report Status PENDING  Incomplete  Blood culture (routine x 2)     Status: None (Preliminary result)   Collection Time: 02/12/22  8:14 PM   Specimen: Right Antecubital; Blood  Result Value Ref Range Status   Specimen Description   Final    RIGHT ANTECUBITAL BOTTLES DRAWN AEROBIC AND ANAEROBIC   Special Requests Blood Culture adequate volume  Final   Culture   Final    NO GROWTH 4 DAYS Performed at [facility], [address], [place], [place] [address]    Report Status PENDING  Incomplete    Studies/Results: No results found.    Assessment/Plan:  INTERVAL HISTORY: pt tolerated ancef   Principal Problem:   Osteomyelitis (HCC) Active Problems:   Essential hypertension   Leukocytosis   Chronic diastolic CHF (congestive heart failure) (HCC)   Squamous cell lung cancer, right (HCC)    Juan Yang is a 67 y.o. male with history of Charcot-Marie-Tooth disorder lower extremity neuropathy newly diagnosed squamous cell carcinoma of the lung who had developed osteomyelitis in his left great toe. Sp partial amputation but without cultures having been taken sp doxy + augmentin but with allergy to augmentin, doxy, levaquin now with mission with infection at the operative site and MRI concerning for osteomyelitis.  #1 postoperative soft tissue infection plus minus osteomyelitis.  Will continue cefazolin and vancomycin for now Podiatry are following closely.  Greatly appreciate Dr. Blenda Mounts and her willingness to proceed with amputation sooner rather than later if the patient is not making sufficient progress in particular given the need for him to seed with chemotherapy for his squamous cell lung carcinoma  Regardless of whether he has an amputation or not  given that he will have a wound here for some time and will go on chemotherapy it may be prudent for him to have oral antibiotics on hand at home in case he develops recurrence of infection so that they can be promptly started  The goal now though eradicate this infection and minimize risk of recurrence in the context of chemotherapy  #2 Squamous cell carcinoma of the lung  awaiting chemotherapy.  I spent 52 minutes with the patient including than 50% of the time in face to face counseling of the patient re his osteomyelitis  along with review of medical records in preparation for the visit and during the visit and in coordination of his care.   Dr. Leighton Parody is back tomorrow.       LOS: 3 days   Alcide Evener 02/16/2022, 1:54 PM

## 2022-02-16 NOTE — Plan of Care (Signed)

## 2022-02-16 NOTE — Progress Notes (Signed)
  Progress Note   Patient: Juan Yang GYI:[id number] DOB: 1955/03/02 DOA: 02/12/2022     3 DOS: the patient was seen and examined on 02/16/2022   Brief hospital course: 67 year old man PMH including Charcot-Marie-Tooth, lower extremity neuropathy, recently underwent left great toe amputation 12/26.  Developed erythema January 1, seen by infectious disease January 3 and referred to the emergency department for MRI.  Imaging revealed osteomyelitis the patient was eventually admitted for further evaluation and treatment for osteomyelitis, infectious disease and podiatry consultations.  Assessment and Plan: * Osteomyelitis left foot (HCC) MR left foot showed proximal phalanx of the first digit concerning for osteomyelitis Podiatry following, initially thought to be bone marrow edema more likely than osteo, plan initially for observation but podiatry now considering operative intervention if unsatisfactory improvement tomorrow. Continue cefazolin and vancomycin per infectious disease.   Dehydration Creatinine down to 1.2, appears to be at baseline now   Chronic diastolic CHF (congestive heart failure) (HCC) Stable.  Continue metoprolol, furosemide   Essential hypertension Remains stable. Continue Norvasc, metoprolol   Squamous cell lung cancer, right ([place]) Recently diagnosed, has not yet started chemo, as infection has been a barrier to starting chemo      Subjective:  Feels ok today  Physical Exam: Vitals:   02/15/22 1026 02/15/22 2023 02/16/22 0519 02/16/22 1329  BP: 132/87 119/83 (!) 121/91 (!) 130/92  Pulse: 81 76 74 78  Resp: 18 16 18 20   Temp: 98.7 F (37.1 C) 97.6 F (36.4 C) 97.6 F (36.4 C) 97.7 F (36.5 C)  TempSrc: Oral Oral Oral Oral  SpO2: 100% 97% 98% 100%  Weight:      Height:       Physical Exam Vitals reviewed.  Constitutional:      General: He is not in acute distress.    Appearance: He is not ill-appearing or toxic-appearing.  Cardiovascular:      Rate and Rhythm: Normal rate and regular rhythm.     Heart sounds: No murmur heard. Pulmonary:     Effort: Pulmonary effort is normal. No respiratory distress.     Breath sounds: No wheezing, rhonchi or rales.  Neurological:     Mental Status: He is alert.  Psychiatric:        Mood and Affect: Mood normal.        Behavior: Behavior normal.    Data Reviewed: Creatinine stable 1.35  Family Communication: none  Disposition: Status is: Inpatient Remains inpatient appropriate because: severe left foot infection  Planned Discharge Destination: Home    Time spent: 25 minutes  Author: Murray Hodgkins, MD 02/16/2022 3:27 PM  For on call review [email or web address].

## 2022-02-16 NOTE — Progress Notes (Signed)
Subjective:  Patient ID: Juan Yang, male    DOB: June 02, 1955,  MRN: [id number]   Patient seen at bedside this morning. Relates pain is stabilized and foot only hurts when hit.      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   History of kidney stones     Hypertension     Peripheral neuropathy 10/29/2016   Pneumonia     Transient cerebral ischemic attack, unspecified 05/10/2020    stroke           Past Surgical History:  Procedure Laterality Date   BRONCHIAL NEEDLE ASPIRATION BIOPSY   11/29/2021    Procedure: BRONCHIAL NEEDLE ASPIRATION BIOPSIES;  Surgeon: Garner Nash, DO;  Location: [facility];  Service: Pulmonary;;   CHOLECYSTECTOMY N/A 12/13/2021    Procedure: LAPAROSCOPIC CHOLECYSTECTOMY;  Surgeon: Virl Cagey, MD;  Location: [facility];  Service: General;  Laterality: N/A;   COLONOSCOPY WITH PROPOFOL N/A 11/14/2016    Procedure: COLONOSCOPY WITH PROPOFOL;  Surgeon: Rogene Houston, MD;  [facility];  Service: Endoscopy;  Laterality: N/A;  9:15   IR IMAGING GUIDED PORT INSERTION   12/24/2021   LUMBAR LAMINECTOMY/DECOMPRESSION MICRODISCECTOMY Right 05/24/2019    Procedure: LAMINECTOMY AND FORAMINOTOMY LUMBAR FIVE- SACRAL ONE RIGHT;  Surgeon: Vallarie Mare, MD;  Location: [facility];  Service: Neurosurgery;  Laterality: Right;  posterior/right   LUNG BIOPSY       MOHS SURGERY Right 05/2020    ear   MOHS SURGERY       POLYPECTOMY   11/14/2016    Procedure: POLYPECTOMY- SIGMOID COLON X3 TRANSVERSE COLON X2;  Surgeon: Rogene Houston, MD;  [facility];  Service: Endoscopy;;   rt ankle surgery        Titanium hinge   TONSILLECTOMY       TRANSFORAMINAL LUMBAR INTERBODY FUSION (TLIF) WITH PEDICLE SCREW FIXATION 1 LEVEL N/A 05/02/2020    Procedure: Lumbar five Sacral one Transforaminal lumbar interbody fusion;  Surgeon: Vallarie Mare, MD;  Location: [place];  Service: Neurosurgery;  Laterality: N/A;   VIDEO BRONCHOSCOPY WITH ENDOBRONCHIAL ULTRASOUND N/A 11/29/2021    Procedure: VIDEO BRONCHOSCOPY WITH ENDOBRONCHIAL ULTRASOUND;  Surgeon: Garner Nash, DO;  Location: [place];  Service: Pulmonary;  Laterality: N/A;          Latest Ref Rng & Units 02/14/2022    5:41 AM 02/13/2022    7:43 AM 02/12/2022    8:07 PM  CBC  WBC 4.0 - 10.5 K/uL 11.7  11.5  13.7   Hemoglobin 13.0 - 17.0 g/dL 14.0  13.7  14.7   Hematocrit 39.0 - 52.0 % 42.9  40.2  45.0   Platelets 150 - 400 K/uL 242  249  266           Latest Ref Rng & Units 02/14/2022    5:41 AM  02/13/2022    7:43 AM 02/12/2022    8:07 PM  BMP  Glucose 70 - 99 mg/dL 104  116  94   BUN 8 - 23 mg/dL 17  23  28    Creatinine 0.61 - 1.24 mg/dL 1.20  1.27  1.36   Sodium 135 - 145 mmol/L 138  136  136   Potassium 3.5 - 5.1 mmol/L 3.8  3.5  3.6   Chloride 98 - 111 mmol/L 107  102  100   CO2 22 - 32 mmol/L 24  26  26    Calcium 8.9 - 10.3 mg/dL 8.4  8.5  9.2         Objective:        Vitals:    02/14/22 1004 02/14/22 1417  BP: (!) 123/96 (!) 126/95  Pulse: 88 81  Resp: 15 17  Temp: 98.1 F (36.7 C) 97.9 F (36.6 C)  SpO2: 98% 97%      General:AA&O x 3. Normal mood and affect    Vascular: DP and PT pulses 2/4 bilateral. Brisk capillary refill to all digits. Pedal hair present    Neruological. Epicritic sensation grossly intact.    Derm: Distal incision to left hallux well copated without signs of dehiscence. Some mild erythema perincision but appears to have improved from previous images in chart. No drainage noted. No malodor Interspaces clears of  maceration. Nails well groomed and normal in appearance. There is some residual erythema noted to incision area.         MSK: MMT 5/5 in dorsiflexion, plantar flexion, inversion and eversion. Normal joint ROM without pain or crepitus.    MRI left foot.  IMPRESSION: 1. Bone marrow edema about the amputation site of the residual proximal phalanx of the first digit concerning for osteomyelitis. 2. Skin wound about the amputation site with subjacent edema and inflammatory changes. No fluid collection or abscess. 3. Increased intrasubstance signal of the plantar muscles suggesting diabetic myopathy/myositis.     Assessment & Plan:  Patient was evaluated and treated and all questions answered.   DX: s/p left hallux partial amputation with residual cellulitis  Wound care: betadine, DSD daily  Antibiotics: Per ID. Currently on IV levaquin and vancomycin. Appreciate their expertise.  DME: post-op shoe  Discussed with patient diagnosis and treatment options.  Imaging reviewed. MRI from 1/3 does show bone marrow edema and relates concern for osteomyelitis.  Discussed with patient that it appears there hasn't been much improvement since last evaluated on antibiotics. Did discuss having a low threshold for return to OR for further amputation of the toe. Patient would like to give it one more day to see if there is any marked improvement in the toe. Discussed the time crunch in getting him started on chemotherapy and patient understands. Will see tomorrow to evaluate for improvement. If not much will go ahead and plan for revision amputation.  Patient in agreement with plan and all questions answered.  Podiatry will continue to follow.    Lorenda Peck, DPM  Accessible via secure chat for questions or concerns.

## 2022-02-17 DIAGNOSIS — I5032 Chronic diastolic (congestive) heart failure: Secondary | ICD-10-CM | POA: Diagnosis not present

## 2022-02-17 DIAGNOSIS — M86172 Other acute osteomyelitis, left ankle and foot: Secondary | ICD-10-CM | POA: Diagnosis not present

## 2022-02-17 DIAGNOSIS — I10 Essential (primary) hypertension: Secondary | ICD-10-CM | POA: Diagnosis not present

## 2022-02-17 LAB — BASIC METABOLIC PANEL
Anion gap: 10 (ref 5–15)
BUN: 16 mg/dL (ref 8–23)
CO2: 24 mmol/L (ref 22–32)
Calcium: 8.8 mg/dL — ABNORMAL LOW (ref 8.9–10.3)
Chloride: 101 mmol/L (ref 98–111)
Creatinine, Ser: 1.2 mg/dL (ref 0.61–1.24)
GFR, Estimated: >60 mL/min (ref >60)
Glucose, Bld: 158 mg/dL — ABNORMAL HIGH (ref 70–99)
Potassium: 3.3 mmol/L — ABNORMAL LOW (ref 3.5–5.1)
Sodium: 135 mmol/L (ref 135–145)

## 2022-02-17 LAB — CULTURE, BLOOD (ROUTINE X 2)
Culture: NO GROWTH
Culture: NO GROWTH
Special Requests: ADEQUATE
Special Requests: ADEQUATE

## 2022-02-17 MED ORDER — POTASSIUM CHLORIDE CRYS ER 20 MEQ PO TBCR
40.0000 meq | EXTENDED_RELEASE_TABLET | ORAL | Status: AC
  Administered 2022-02-17 (×2): 40 meq via ORAL
  Filled 2022-02-17 (×2): qty 2

## 2022-02-17 NOTE — Progress Notes (Signed)
Pharmacy Antibiotic Note  Juan Yang is a 67 y.o. male admitted on 02/12/2022 with  osteomyelitis .  Pharmacy has been consulted for vancomycin dosing.  Today, 02/17/2022: Remains afebrile since admission WBC slightly elevated but stable on 1/5; no new CBC SCr back to 1.2 (baseline) Initially seemed improved going into the weekend per Podiatry, but as of 1/7 had plateaued; plan is for OR today if no further improvement this AM  Plan: Continue Vancomycin 2 g IV q24 hr (eAUC 511 using SCr 1.2; Vd 0.72; estimated trough 10.8 Ancef per MD; dosing appropriate F/u Podiatry/ID plans for abx narrowing/LOT  Height: 6' (182.9 cm) Weight: 91.3 kg (201 lb 4.5 oz) IBW/kg (Calculated) : 77.6  Temp (24hrs), Avg:97.5 F (36.4 C), Min:97.3 F (36.3 C), Max:97.7 F (36.5 C)  Recent Labs  Lab 02/12/22 2000 02/12/22 2007 02/12/22 2007 02/13/22 0743 02/14/22 0541 02/15/22 0605 02/16/22 0548 02/17/22 0624  WBC  --  13.7*  --  11.5* 11.7*  --   --   --   CREATININE  --  1.36*   < > 1.27* 1.20 1.21 1.35* 1.20  LATICACIDVEN 1.4  --   --   --   --   --   --   --    < > = values in this interval not displayed.     Estimated Creatinine Clearance: 66.5 mL/min (by C-G formula based on SCr of 1.2 mg/dL).    Allergies  Allergen Reactions   Other Hives    DUCK EGGS ONLY   Atenolol Diarrhea and Nausea Only    Pt unsure of allergy? Possible fainting?   Cymbalta [Duloxetine Hcl] Other (See Comments)    Pt states it shut his bladder down and could not urinate; states it made him lethargic as well.   Latex Itching   Penicillins Hives    Did it involve swelling of the face/tongue/throat, SOB, or low BP? Unknown Did it involve sudden or severe rash/hives, skin peeling, or any reaction on the inside of your mouth or nose? Unknown Did you need to seek medical attention at a hospital or doctor's office? yes When did it last happen?      Childhood If all above answers are "NO", may proceed with  cephalosporin use.     Thank you for allowing pharmacy to be a part of this patient's care.  Reuel Boom, PharmD, BCPS [phone] 02/17/2022, 8:47 AM

## 2022-02-17 NOTE — Progress Notes (Signed)
Subjective:  Patient ID: Juan Yang, male    DOB: [birth year],  MRN: [id number]   Patient seen at bedside this morning. Relates pain is stabilized and foot only hurts when hit.      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   History of kidney stones     Hypertension     Peripheral neuropathy 10/29/2016   Pneumonia     Transient cerebral ischemic attack, unspecified 05/10/2020    stroke           Past Surgical History:  Procedure Laterality Date   BRONCHIAL NEEDLE ASPIRATION BIOPSY   11/29/2021    Procedure: BRONCHIAL NEEDLE ASPIRATION BIOPSIES;  Surgeon: Garner Nash, DO;  Location: [facility];  Service: Pulmonary;;   CHOLECYSTECTOMY N/A 12/13/2021    Procedure: LAPAROSCOPIC CHOLECYSTECTOMY;  Surgeon: Virl Cagey, MD;  Location: [facility];  Service: General;  Laterality: N/A;   COLONOSCOPY WITH PROPOFOL N/A 11/14/2016    Procedure: COLONOSCOPY WITH PROPOFOL;  Surgeon: Rogene Houston, MD;  [facility];  Service: Endoscopy;  Laterality: N/A;  9:15   IR IMAGING GUIDED PORT INSERTION   12/24/2021   LUMBAR LAMINECTOMY/DECOMPRESSION MICRODISCECTOMY Right 05/24/2019    Procedure: LAMINECTOMY AND FORAMINOTOMY LUMBAR FIVE- SACRAL ONE RIGHT;  Surgeon: Vallarie Mare, MD;  Location: [facility];  Service: Neurosurgery;  Laterality: Right;  posterior/right   LUNG BIOPSY       MOHS SURGERY Right 05/2020    ear   MOHS SURGERY       POLYPECTOMY   11/14/2016    Procedure: POLYPECTOMY- SIGMOID COLON X3 TRANSVERSE COLON X2;  Surgeon: Rogene Houston, MD;  [facility];  Service: Endoscopy;;   rt ankle surgery        Titanium hinge   TONSILLECTOMY       TRANSFORAMINAL LUMBAR INTERBODY FUSION (TLIF) WITH PEDICLE SCREW FIXATION 1 LEVEL N/A 05/02/2020    Procedure: Lumbar five Sacral one Transforaminal lumbar interbody fusion;  Surgeon: Vallarie Mare, MD;  Location: [facility];  Service: Neurosurgery;  Laterality: N/A;   VIDEO BRONCHOSCOPY WITH ENDOBRONCHIAL ULTRASOUND N/A 11/29/2021    Procedure: VIDEO BRONCHOSCOPY WITH ENDOBRONCHIAL ULTRASOUND;  Surgeon: Garner Nash, DO;  Location: [place];  Service: Pulmonary;  Laterality: N/A;          Latest Ref Rng & Units 02/14/2022    5:41 AM 02/13/2022    7:43 AM 02/12/2022    8:07 PM  CBC  WBC 4.0 - 10.5 K/uL 11.7  11.5  13.7   Hemoglobin 13.0 - 17.0 g/dL 14.0  13.7  14.7   Hematocrit 39.0 - 52.0 % 42.9  40.2  45.0   Platelets 150 - 400 K/uL 242  249  266           Latest Ref Rng & Units 02/14/2022    5:41 AM  02/13/2022    7:43 AM 02/12/2022    8:07 PM  BMP  Glucose 70 - 99 mg/dL 104  116  94   BUN 8 - 23 mg/dL 17  23  28    Creatinine 0.61 - 1.24 mg/dL 1.20  1.27  1.36   Sodium 135 - 145 mmol/L 138  136  136   Potassium 3.5 - 5.1 mmol/L 3.8  3.5  3.6   Chloride 98 - 111 mmol/L 107  102  100   CO2 22 - 32 mmol/L 24  26  26    Calcium 8.9 - 10.3 mg/dL 8.4  8.5  9.2         Objective:        Vitals:    02/14/22 1004 02/14/22 1417  BP: (!) 123/96 (!) 126/95  Pulse: 88 81  Resp: 15 17  Temp: 98.1 F (36.7 C) 97.9 F (36.6 C)  SpO2: 98% 97%      General:AA&O x 3. Normal mood and affect    Vascular: DP and PT pulses 2/4 bilateral. Brisk capillary refill to all digits. Pedal hair present    Neruological. Epicritic sensation grossly intact.    Derm: Distal incision to left hallux well copated without signs of dehiscence. Some mild erythema perincision but appears to have improved from previous images in chart. No drainage noted. No malodor Interspaces clears of  maceration. Nails well groomed and normal in appearance. Does appear to be decreased erythema noted around incision.     MSK: MMT 5/5 in dorsiflexion, plantar flexion, inversion and eversion. Normal joint ROM without pain or crepitus.    MRI left foot.  IMPRESSION: 1. Bone marrow edema about the amputation site of the residual proximal phalanx of the first digit concerning for osteomyelitis. 2. Skin wound about the amputation site with subjacent edema and inflammatory changes. No fluid collection or abscess. 3. Increased intrasubstance signal of the plantar muscles suggesting diabetic myopathy/myositis.     Assessment & Plan:  Patient was evaluated and treated and all questions answered.   DX: s/p left hallux partial amputation with residual cellulitis  Wound care: betadine, DSD daily  Antibiotics: Per ID. Currently on IV levaquin and vancomycin. Appreciate their expertise.  DME: post-op shoe  Discussed with patient diagnosis and treatment options.  Imaging reviewed. MRI from 1/3 does show bone marrow edema and relates concern for osteomyelitis.  Discussed with patient  there has been some mild improvement  on antibiotics. Did discuss having a low threshold for return to OR for further amputation of the toe. Patient would like to give it one more day to see if there is any more improvement in the toe. Discussed again the time crunch in getting him started on chemotherapy and patient understands.  Patient in agreement with plan and all questions answered.  Podiatry will continue to follow.    Lorenda Peck, DPM  Accessible via secure chat for questions or concerns.

## 2022-02-17 NOTE — Progress Notes (Signed)
  Progress Note   Patient: Juan Yang OVZ:[id number] DOB: 30-Mar-1955 DOA: 02/12/2022     4 DOS: the patient was seen and examined on 02/17/2022   Brief hospital course: 67 year old man PMH including Charcot-Marie-Tooth, lower extremity neuropathy, recently underwent left great toe amputation 12/26.  Developed erythema January 1, seen by infectious disease January 3 and referred to the emergency department for MRI.  Imaging revealed osteomyelitis the patient was eventually admitted for further evaluation and treatment for osteomyelitis, infectious disease and podiatry consultations.  Assessment and Plan: * Osteomyelitis left foot (HCC) MR left foot showed proximal phalanx of the first digit concerning for osteomyelitis Podiatry following, initially thought to be bone marrow edema more likely than osteo, plan initially for observation but podiatry now considering operative intervention if unsatisfactory improvement.  Plan to observe for another day per podiatry. Continue cefazolin and vancomycin per infectious disease.   Dehydration Creatinine down to 1.2, appears to be at baseline    Chronic diastolic CHF (congestive heart failure) (HCC) Remains stable.  Continue metoprolol, furosemide   Essential hypertension Remains stable. Continue Norvasc, metoprolol   Squamous cell lung cancer, right ([place]) Recently diagnosed, has not yet started chemo, as infection has been a barrier to starting chemo      Subjective:  Feels about the same Foot a bit better  Physical Exam: Vitals:   02/16/22 1329 02/16/22 2107 02/17/22 0642 02/17/22 1455  BP: (!) 130/92 127/88 (!) 131/98 (!) 146/94  Pulse: 78 81 83 84  Resp: 20 16 18 16   Temp: 97.7 F (36.5 C) (!) 97.3 F (36.3 C) 97.6 F (36.4 C) 97.8 F (36.6 C)  TempSrc: Oral Oral Oral Oral  SpO2: 100% 98% 100% 100%  Weight:      Height:       Physical Exam Vitals reviewed.  Constitutional:      General: He is not in acute distress.     Appearance: He is not ill-appearing or toxic-appearing.  Cardiovascular:     Rate and Rhythm: Normal rate and regular rhythm.     Heart sounds: No murmur heard. Pulmonary:     Effort: Pulmonary effort is normal. No respiratory distress.     Breath sounds: No wheezing, rhonchi or rales.  Neurological:     Mental Status: He is alert.  Psychiatric:        Mood and Affect: Mood normal.        Behavior: Behavior normal.    Data Reviewed: K+ 3.3  Family Communication: none  Disposition: Status is: Inpatient Remains inpatient appropriate because: foot cellulitis  Planned Discharge Destination: Home    Time spent: 20 minutes  Author: Murray Hodgkins, MD 02/17/2022 6:05 PM  For on call review [email or web address].

## 2022-02-17 NOTE — Care Management Important Message (Signed)
Important Message  Patient Details IM Letter given. Name: Juan Yang MRN: [id number] Date of Birth: [birth year]   Medicare Important Message Given:  Yes     Kerin Salen 02/17/2022, 11:31 AM

## 2022-02-18 DIAGNOSIS — M869 Osteomyelitis, unspecified: Secondary | ICD-10-CM

## 2022-02-18 DIAGNOSIS — M86172 Other acute osteomyelitis, left ankle and foot: Secondary | ICD-10-CM | POA: Diagnosis not present

## 2022-02-18 DIAGNOSIS — I5032 Chronic diastolic (congestive) heart failure: Secondary | ICD-10-CM | POA: Diagnosis not present

## 2022-02-18 DIAGNOSIS — I10 Essential (primary) hypertension: Secondary | ICD-10-CM | POA: Diagnosis not present

## 2022-02-18 LAB — BASIC METABOLIC PANEL
Anion gap: 8 (ref 5–15)
BUN: 14 mg/dL (ref 8–23)
CO2: 22 mmol/L (ref 22–32)
Calcium: 8.8 mg/dL — ABNORMAL LOW (ref 8.9–10.3)
Chloride: 105 mmol/L (ref 98–111)
Creatinine, Ser: 1.1 mg/dL (ref 0.61–1.24)
GFR, Estimated: >60 mL/min (ref >60)
Glucose, Bld: 103 mg/dL — ABNORMAL HIGH (ref 70–99)
Potassium: 3.9 mmol/L (ref 3.5–5.1)
Sodium: 135 mmol/L (ref 135–145)

## 2022-02-18 MED ORDER — BISACODYL 10 MG RE SUPP: 10.0000 mg | Freq: Every day | RECTAL | Status: DC | PRN

## 2022-02-18 MED ORDER — SENNA 8.6 MG PO TABS
1.0000 | ORAL_TABLET | Freq: Every day | ORAL | Status: DC
  Administered 2022-02-18: 8.6 mg via ORAL
  Filled 2022-02-18: qty 1

## 2022-02-18 MED ORDER — POLYETHYLENE GLYCOL 3350 17 G PO PACK
17.0000 g | PACK | Freq: Two times a day (BID) | ORAL | Status: DC
  Administered 2022-02-18 (×2): 17 g via ORAL
  Filled 2022-02-18 (×2): qty 1

## 2022-02-18 NOTE — Progress Notes (Addendum)
Subjective:  Patient ID: Juan Yang, male    DOB: May 08, 1955,  MRN: [id number]   Patient seen at bedside resting comfortably says he is feeling much better and feels like the antibiotics have helped quite a bit         Latest Ref Rng & Units 02/14/2022    5:41 AM 02/13/2022    7:43 AM 02/12/2022    8:07 PM  CBC  WBC 4.0 - 10.5 K/uL 11.7  11.5  13.7   Hemoglobin 13.0 - 17.0 g/dL 14.0  13.7  14.7   Hematocrit 39.0 - 52.0 % 42.9  40.2  45.0   Platelets 150 - 400 K/uL 242  249  266           Latest Ref Rng & Units 02/14/2022    5:41 AM 02/13/2022    7:43 AM 02/12/2022    8:07 PM  BMP  Glucose 70 - 99 mg/dL 104  116  94   BUN 8 - 23 mg/dL 17  23  28    Creatinine 0.61 - 1.24 mg/dL 1.20  1.27  1.36   Sodium 135 - 145 mmol/L 138  136  136   Potassium 3.5 - 5.1 mmol/L 3.8  3.5  3.6   Chloride 98 - 111 mmol/L 107  102  100   CO2 22 - 32 mmol/L 24  26  26    Calcium 8.9 - 10.3 mg/dL 8.4  8.5  9.2         Objective:        Vitals:    02/14/22 1004 02/14/22 1417  BP: (!) 123/96 (!) 126/95  Pulse: 88 81  Resp: 15 17  Temp: 98.1 F (36.7 C) 97.9 F (36.6 C)  SpO2: 98% 97%      General:AA&O x 3. Normal mood and affect    Vascular: DP and PT pulses 2/4 bilateral. Brisk capillary refill to all digits. Pedal hair present    Neruological. Epicritic sensation grossly intact.    Derm: Cellulitis is resolved sutures are healing well without evidence of clear infection currently   MSK: MMT 5/5 in dorsiflexion, plantar flexion, inversion and eversion. Normal joint ROM without pain or crepitus.    MRI left foot.  IMPRESSION: 1. Bone marrow edema about the amputation site of the residual proximal phalanx of the first digit concerning for osteomyelitis. 2. Skin wound about the amputation site with subjacent edema and inflammatory changes. No fluid collection or abscess. 3. Increased intrasubstance signal of the plantar muscles suggesting diabetic myopathy/myositis.         Assessment & Plan:  Patient was evaluated and treated and all questions answered.   DX: s/p left hallux partial amputation with residual cellulitis  Wound care: betadine, DSD daily  Antibiotics: Per ID. Currently on IV cefazolin and vancomycin. Appreciate their expertise.  DME: post-op shoe  We again discussed his clinical progress as well as the possibility of any residual infection and how this relates to his plan to start chemotherapy.  His infection appears to be under control at this point and he is anxious to leave the hospital.  I think this point this is reasonable and we will continue to monitor his progress now that his wound is healed and we can follow-up with him in 1 to 2 weeks with reevaluation after continuing his oral antibiotics and update his radiographs.  I do recommend taking a CBC with differential and ESR for another baseline in the morning prior to discharge  and this has been ordered.  He can follow-up with Dr. Amalia Hailey or Dr. Blenda Mounts myself following discharge.  I discussed with him that he can begin to bathe the foot and apply triple antibiotic ointment as well as a gauze dressing with tape over the incision site. -Okay to discharge from our standpoint tomorrow a.m.  Lanae Crumbly, DPM 02/18/2022   Accessible via secure chat for questions or concerns.

## 2022-02-18 NOTE — Plan of Care (Signed)

## 2022-02-18 NOTE — Progress Notes (Signed)
  Progress Note   Patient: Juan Yang HKV:[id number] DOB: Feb 08, 1956 DOA: 02/12/2022     5 DOS: the patient was seen and examined on 02/18/2022   Brief hospital course: 67 year old man PMH including Charcot-Marie-Tooth, lower extremity neuropathy, recently underwent left great toe amputation 12/26.  Developed erythema January 1, seen by infectious disease January 3 and referred to the emergency department for MRI.  Imaging revealed osteomyelitis the patient was eventually admitted for further evaluation and treatment for osteomyelitis, infectious disease and podiatry consultations.  Podiatry initially skeptical of infection.  Podiatry now considering operative intervention.  Assessment and Plan: * Osteomyelitis left foot (HCC) MR left foot showed proximal phalanx of the first digit concerning for osteomyelitis Podiatry following, initially thought to be bone marrow edema more likely than osteo, plan initially for observation but podiatry now considering operative intervention if unsatisfactory improvement.  Plan to observe for another day per podiatry. Continue cefazolin and vancomycin per infectious disease.   Dehydration Creatinine down to 1.2, appears to be at baseline    Chronic diastolic CHF (congestive heart failure) (HCC) Remains stable.  Continue metoprolol, furosemide   Essential hypertension Remains stable. Continue Norvasc, metoprolol   Squamous cell lung cancer, right ([place]) Recently diagnosed, has not yet started chemo, as infection has been a barrier to starting chemo      Subjective:  Feels ok  Physical Exam: Vitals:   02/17/22 1455 02/17/22 2102 02/18/22 0427 02/18/22 1326  BP: (!) 146/94 (!) 137/99 (!) 124/97 (!) 127/93  Pulse: 84 74 67 71  Resp: 16 19 20 17   Temp: 97.8 F (36.6 C) 97.9 F (36.6 C) (!) 97.5 F (36.4 C) 97.8 F (36.6 C)  TempSrc: Oral   Oral  SpO2: 100% 99% 100% 99%  Weight:      Height:       Physical Exam Vitals reviewed.   Constitutional:      General: He is not in acute distress.    Appearance: He is not ill-appearing or toxic-appearing.  Cardiovascular:     Rate and Rhythm: Normal rate and regular rhythm.     Heart sounds: No murmur heard. Pulmonary:     Effort: Pulmonary effort is normal. No respiratory distress.     Breath sounds: No wheezing, rhonchi or rales.  Neurological:     Mental Status: He is alert.  Psychiatric:        Mood and Affect: Mood normal.        Behavior: Behavior normal.    Data Reviewed: BMP noted  Family Communication: none  Disposition: Status is: Inpatient Remains inpatient appropriate because: foot infection  Planned Discharge Destination: Home    Time spent: 20 minutes  Author: Murray Hodgkins, MD 02/18/2022 4:24 PM  For on call review [email or web address].

## 2022-02-19 ENCOUNTER — Telehealth: Payer: Self-pay

## 2022-02-19 LAB — CBC WITH DIFFERENTIAL/PLATELET
Abs Immature Granulocytes: 0.04 10*3/uL (ref 0.00–0.07)
Basophils Absolute: 0.1 10*3/uL (ref 0.0–0.1)
Basophils Relative: 1 %
Eosinophils Absolute: 0.3 10*3/uL (ref 0.0–0.5)
Eosinophils Relative: 3 %
HCT: 43.5 % (ref 39.0–52.0)
Hemoglobin: 14.5 g/dL (ref 13.0–17.0)
Immature Granulocytes: 0 %
Lymphocytes Relative: 19 %
Lymphs Abs: 2.2 10*3/uL (ref 0.7–4.0)
MCH: 30.1 pg (ref 26.0–34.0)
MCHC: 33.3 g/dL (ref 30.0–36.0)
MCV: 90.2 fL (ref 80.0–100.0)
Monocytes Absolute: 0.8 10*3/uL (ref 0.1–1.0)
Monocytes Relative: 8 %
Neutro Abs: 7.7 10*3/uL (ref 1.7–7.7)
Neutrophils Relative %: 69 %
Platelets: 225 10*3/uL (ref 150–400)
RBC: 4.82 MIL/uL (ref 4.22–5.81)
RDW: 13.1 % (ref 11.5–15.5)
WBC: 11.2 10*3/uL — ABNORMAL HIGH (ref 4.0–10.5)
nRBC: 0 % (ref 0.0–0.2)

## 2022-02-19 LAB — SEDIMENTATION RATE: Sed Rate: 27 mm/hr — ABNORMAL HIGH (ref 0–16)

## 2022-02-19 MED ORDER — CEFADROXIL 1 G PO TABS: 1.0000 g | ORAL_TABLET | Freq: Two times a day (BID) | ORAL | 0 refills | Status: AC

## 2022-02-19 MED ORDER — DOXYCYCLINE HYCLATE 100 MG PO TABS: 100.0000 mg | ORAL_TABLET | Freq: Two times a day (BID) | ORAL | 0 refills | Status: AC

## 2022-02-19 NOTE — Progress Notes (Addendum)
[facility] Infectious Diseases Follow Up Note  Patient Identification: Patient Name: Juan Yang MRN: [id number] Admit Date: 02/12/2022  5:26 PM Age: 67 y.o.Today's Date: 02/19/2022  Reason for Visit: osteomyelitis   Principal Problem:   Osteomyelitis ([place]) Active Problems:   Essential hypertension   Leukocytosis   Chronic diastolic CHF (congestive heart failure) (HCC)   Squamous cell lung cancer, right (HCC)   Antibiotics:  Doxycycline and levaquin prior to admit Vancomycin 1/3- Levofloxacin 1/4-1/6, cefazolin 1/6-c  Lines/Hardwares: left chest port , TLIF with screws, rt ankle titanium hinge   Interval Events: remains afebrile, reported improvement in erythema and overall improvement per podiatry    Assessment 89 Y O  male with PMH as below including CMT disease, LE neuropathy with recently diagnosed  squamous cell ca pending chemotherapy with recent left great toe amputation 12/26 with   # Osteomyelitis of prior amputation site, residual proximal phalanx of left first digit with wound/myositis  11/29 wound cx left toe NG  1/3 blood cx NG  1/6 ABI Resting right ankle-brachial index indicates noncompressible right lower extremity arteries. The right toe-brachial index is normal. Resting left ankle-brachial index is within normal range.  Podiatry notes reviewed and surgical intervention has been deferred currently due to some improvement on IV abtx based on discussion with patient and podiatry. Noted that patient prefers PO over IV. He has received approx a week of IV abtx during this admission and reasonable to switch to PO regimen  Recommendations Ok to switch to PO doxycycline and PO cefadroxil to complete 6 weeks course (IV and PO). EOT 03/27/22 Discussed with patient that there is still a risk that antibiotics may not completely cure the infection and it can recur and to be vigilant Discussed warning signs and  symptoms to seek immediate attention  Fu with Podiatry as instructed  Will arrange for fu in [place] clinic in 2-3 weeks  ID will SO. D/w Dr Posey Pronto and [place] of the management as per the primary team. Thank you for the consult. Please page with pertinent questions or concerns.  ______________________________________________________________________ Subjective patient seen and examined at the bedside. Tells eythema and swelling in the left great toe amputated site improving. Denies fevers, chills. 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   History of kidney stones    Hypertension    Peripheral neuropathy 10/29/2016   Pneumonia    Transient cerebral ischemic attack, unspecified 05/10/2020   stroke   Past Surgical History:  Procedure Laterality Date   BRONCHIAL NEEDLE ASPIRATION BIOPSY  11/29/2021   Procedure: BRONCHIAL NEEDLE ASPIRATION BIOPSIES;  Surgeon: Garner Nash, DO;  Location: [facility];  Service: Pulmonary;;   CHOLECYSTECTOMY N/A 12/13/2021   Procedure: LAPAROSCOPIC CHOLECYSTECTOMY;  Surgeon: Virl Cagey, MD;  Location: [facility];  Service: General;  Laterality: N/A;   COLONOSCOPY WITH PROPOFOL N/A 11/14/2016   Procedure: COLONOSCOPY WITH PROPOFOL;  Surgeon:  Rogene Houston, MD;  [facility];  Service: Endoscopy;  Laterality: N/A;  9:15   IR IMAGING GUIDED PORT INSERTION  12/24/2021   LUMBAR LAMINECTOMY/DECOMPRESSION MICRODISCECTOMY Right 05/24/2019   Procedure: LAMINECTOMY AND FORAMINOTOMY LUMBAR  FIVE- SACRAL ONE RIGHT;  Surgeon: Vallarie Mare, MD;  Location: [facility];  Service: Neurosurgery;  Laterality: Right;  posterior/right   LUNG BIOPSY     MOHS SURGERY Right 05/2020   ear   MOHS SURGERY     POLYPECTOMY  11/14/2016   Procedure: POLYPECTOMY- SIGMOID COLON X3 TRANSVERSE COLON X2;  Surgeon: Rogene Houston, MD;  [facility];  Service: Endoscopy;;   rt ankle surgery     Titanium hinge   TONSILLECTOMY     TRANSFORAMINAL LUMBAR INTERBODY FUSION (TLIF) WITH PEDICLE SCREW FIXATION 1 LEVEL N/A 05/02/2020   Procedure: Lumbar five Sacral one Transforaminal lumbar interbody fusion;  Surgeon: Vallarie Mare, MD;  Location: [facility];  Service: Neurosurgery;  Laterality: N/A;   VIDEO BRONCHOSCOPY WITH ENDOBRONCHIAL ULTRASOUND N/A 11/29/2021   Procedure: VIDEO BRONCHOSCOPY WITH ENDOBRONCHIAL ULTRASOUND;  Surgeon: Garner Nash, DO;  Location: [facility];  Service: Pulmonary;  Laterality: N/A;    Vitals BP (!) 140/93 (BP Location: Right Arm)   Pulse (!) 102   Temp (!) 97.4 F (36.3 C)   Resp 18   Ht 6' (1.829 m)   Wt 91.3 kg   SpO2 100%   BMI 27.30 kg/m     Physical Exam Constitutional: appears stated age, comfortable      Comments:   Cardiovascular:     Rate and Rhythm: Normal rate and regular rhythm.     Heart sounds: s1s2, RRR, murmur +  Pulmonary:     Effort: Pulmonary effort is normal on room air     Comments: Normal breath sounds   Abdominal:     Palpations: Abdomen is soft.     Tenderness: non distended and non tender   Musculoskeletal:        General: No swelling or tenderness in peripheral joints   Left great toe amputated site with no erythema/swelling/fluctuance or tenderness or active drainage.   Skin:    Comments: No rashes   Neurological:     General: awake, alert and oriented   Psychiatric:        Mood and Affect: Mood normal.   Pertinent Microbiology Results for orders placed or performed during the hospital encounter of  02/12/22  Blood culture (routine x 2)     Status: None   Collection Time: 02/12/22  8:07 PM   Specimen: Left Antecubital; Blood  Result Value Ref Range Status   Specimen Description   Final    LEFT ANTECUBITAL BOTTLES DRAWN AEROBIC AND ANAEROBIC   Special Requests Blood Culture adequate volume  Final   Culture   Final    NO GROWTH 5 DAYS Performed at [facility], [address] [place], [place] [address]    Report Status 02/17/2022 FINAL  Final  Blood culture (routine x 2)     Status: None   Collection Time: 02/12/22  8:14 PM   Specimen: Right Antecubital; Blood  Result Value Ref Range Status   Specimen Description   Final    RIGHT ANTECUBITAL BOTTLES DRAWN AEROBIC AND ANAEROBIC   Special Requests Blood Culture adequate volume  Final   Culture   Final    NO GROWTH 5 DAYS Performed at [facility], [address] [place], [place] [address]  Report Status 02/17/2022 FINAL  Final    Pertinent Lab    Latest Ref Rng & Units 02/19/2022    5:46 AM 02/14/2022    5:41 AM 02/13/2022    7:43 AM  CBC  WBC 4.0 - 10.5 K/uL 11.2  11.7  11.5   Hemoglobin 13.0 - 17.0 g/dL 14.5  14.0  13.7   Hematocrit 39.0 - 52.0 % 43.5  42.9  40.2   Platelets 150 - 400 K/uL 225  242  249       Latest Ref Rng & Units 02/18/2022    5:36 AM 02/17/2022    6:24 AM 02/16/2022    5:48 AM  CMP  Glucose 70 - 99 mg/dL 103  158  114   BUN 8 - 23 mg/dL 14  16  17    Creatinine 0.61 - 1.24 mg/dL 1.10  1.20  1.35   Sodium 135 - 145 mmol/L 135  135  136   Potassium 3.5 - 5.1 mmol/L 3.9  3.3  3.5   Chloride 98 - 111 mmol/L 105  101  104   CO2 22 - 32 mmol/L 22  24  24    Calcium 8.9 - 10.3 mg/dL 8.8  8.8  8.5      Pertinent Imaging today Plain films and CT images have been personally visualized and interpreted; radiology reports have been reviewed. Decision making incorporated into the Impression / Recommendations.  VAS [place] ABI WITH/WO TBI  Result Date: 02/15/2022  LOWER EXTREMITY DOPPLER STUDY Patient Name:   Juan Yang  Date of Exam:   02/14/2022 Medical Rec #: [id number]         Accession #:    [id number] Date of Birth: [birth year]         Patient Gender: M Patient Age:   25 years Exam Location:  [facility] Procedure:      VAS [place] ABI WITH/WO TBI Referring Phys: REBECCA SIKORA --------------------------------------------------------------------------------  Indications: Ulceration. High Risk Factors: Hypertension.  Vascular Interventions: Left great toe amputation. Comparison Study: No prior studies. Performing Technologist: Carlos Levering RVT  Examination Guidelines: A complete evaluation includes at minimum, Doppler waveform signals and systolic blood pressure reading at the level of bilateral brachial, anterior tibial, and posterior tibial arteries, when vessel segments are accessible. Bilateral testing is considered an integral part of a complete examination. Photoelectric Plethysmograph (PPG) waveforms and toe systolic pressure readings are included as required and additional duplex testing as needed. Limited examinations for reoccurring indications may be performed as noted.  ABI Findings: +---------+------------------+-----+---------+--------+ Right    Rt Pressure (mmHg)IndexWaveform Comment  +---------+------------------+-----+---------+--------+ Brachial 133                    triphasic         +---------+------------------+-----+---------+--------+ PTA      168               1.25 triphasic         +---------+------------------+-----+---------+--------+ DP       179               1.34 triphasic         +---------+------------------+-----+---------+--------+ Great Toe125               0.93                   +---------+------------------+-----+---------+--------+ +---------+------------------+-----+---------+----------+ Left     Lt Pressure (mmHg)IndexWaveform Comment    +---------+------------------+-----+---------+----------+ Brachial 134  triphasic           +---------+------------------+-----+---------+----------+ PTA      161               1.20 triphasic           +---------+------------------+-----+---------+----------+ DP       170               1.27 triphasic           +---------+------------------+-----+---------+----------+ Great Toe                                Amputation +---------+------------------+-----+---------+----------+ +-------+-----------+-----------+------------+------------+ ABI/TBIToday's ABIToday's TBIPrevious ABIPrevious TBI +-------+-----------+-----------+------------+------------+ Right  1.34       0.93                                +-------+-----------+-----------+------------+------------+ Left   1.27                                           +-------+-----------+-----------+------------+------------+   Summary: Right: Resting right ankle-brachial index indicates noncompressible right lower extremity arteries. The right toe-brachial index is normal. Left: Resting left ankle-brachial index is within normal range. Unable to obtain TBI due to great toe amputation. *See table(s) above for measurements and observations.  Electronically signed by Orlie Pollen on 02/15/2022 at 1:20:45 PM.    Final    MR FOOT LEFT WO CONTRAST  Result Date: 02/12/2022 CLINICAL DATA:  Soft tissue infection suspected. Infection at the toe amputation site. EXAM: MRI OF THE LEFT FOOT WITHOUT CONTRAST TECHNIQUE: Multiplanar, multisequence MR imaging of the left forefoot was performed. No intravenous contrast was administered. COMPARISON:  Radiographs dated February 12, 2022 and prior MRI examination dated January 13, 2022 FINDINGS: Bones/Joint/Cartilage Postsurgical changes for prior amputation through the base of the proximal phalanx of the first digit. There is marrow edema about the amputation site. Ligaments Lisfranc and collateral ligaments are intact. Muscles and Tendons Increased intrasubstance signal of  the plantar muscles suggesting diabetic myopathy/myositis. No fluid collection or abscess. Soft tissues Skin wound about the amputation site with subjacent edema and inflammatory changes. Generalized subcutaneous edema about the dorsum of the foot. No fluid collection. IMPRESSION: 1. Bone marrow edema about the amputation site of the residual proximal phalanx of the first digit concerning for osteomyelitis. 2. Skin wound about the amputation site with subjacent edema and inflammatory changes. No fluid collection or abscess. 3. Increased intrasubstance signal of the plantar muscles suggesting diabetic myopathy/myositis. Electronically Signed   By: Keane Police D.O.   On: 02/12/2022 21:31   DG Foot Complete Left  Result Date: 02/12/2022 CLINICAL DATA:  Great toe pain EXAM: LEFT FOOT - COMPLETE 3+ VIEW COMPARISON:  01/01/2022 FINDINGS: Status post partial amputation first digit at the level of proximal phalanx. Cut margins appear smooth. No erosive change. No soft tissue emphysema. IMPRESSION: Status post partial amputation of the first digit. No acute osseous abnormality. Electronically Signed   By: Donavan Foil M.D.   On: 02/12/2022 18:51    I spent 55 minutes for this patient encounter including review of prior medical records, coordination of care with primary/other specialist with greater than 50% of time being face to face/counseling and discussing diagnostics/treatment plan with the patient/family.  Electronically signed by:  Rosiland Oz, MD Infectious Disease Physician [facility] Pager: [phone]

## 2022-02-19 NOTE — Telephone Encounter (Signed)
-----   Message from Rosiland Oz, MD sent at 02/19/2022  1:29 PM EST ----- Regarding: HFU appt Could you please schedule a HFU appt for this patient with one of the ID docs ( preferably with Dr Tommy Medal, myself, Dr Juleen China or Dr Candiss Norse ?) Thanks

## 2022-02-19 NOTE — Plan of Care (Signed)

## 2022-02-19 NOTE — Telephone Encounter (Signed)
Patient accepts appointment date/time.  Juan Mcalpine, LPN

## 2022-02-20 ENCOUNTER — Ambulatory Visit: Payer: Medicare Other | Admitting: Internal Medicine

## 2022-02-20 ENCOUNTER — Telehealth: Payer: Self-pay | Admitting: *Deleted

## 2022-02-20 NOTE — Patient Outreach (Signed)
  Care Coordination TOC Note Transition Care Management Unsuccessful Follow-up Telephone Call  Date of discharge and from where:  [facility] on 02/19/22  Attempts:  1st Attempt  Reason for unsuccessful TCM follow-up call:  Left voice message  Discharge Information: Dx: Osteomyelitis of prior amputation site, residual proximal phalanx of left first digit with wound/myositis  Scheduled with: Daylene Katayama, DPM on 02/25/22 at 9:30 and Jule Ser, DO (infectious disease) 03/04/22 at 3:30. PCP follow-up was not recommended. Prescribed: Doxycycline and cefadroxil   Chong Sicilian, BSN, RN-BC RN Care Coordinator [place] Direct Dial: [phone] Main #: [phone]

## 2022-02-21 ENCOUNTER — Telehealth: Payer: Self-pay | Admitting: *Deleted

## 2022-02-21 NOTE — Patient Outreach (Signed)
  Care Coordination TOC Note Transition Care Management Unsuccessful Follow-up Telephone Call  Date of discharge and from where:  [facility] on 02/19/22  Attempts:  2nd Attempt  Reason for unsuccessful TCM follow-up call:  Left voice message  Follow-up Appts: Gala Lewandowsky, DPM on 02/25/22 at 9:30 Gwynn Burly, DO (infectious disease) 03/04/22 at 3:30  Demetrios Loll, BSN, RN-BC RN Care Coordinator [facility]  [facility] Direct Dial: [phone] Main #: [phone]

## 2022-02-21 NOTE — Patient Outreach (Signed)
  Care Coordination TOC Note Transition Care Management Unsuccessful Follow-up Telephone Call  Date of discharge and from where:  [facility] on 02/19/22  Attempts:  3rd Attempt  Reason for unsuccessful TCM follow-up call:  Left voice message  Follow-up Appts:  Gala Lewandowsky, DPM on 02/25/22 at 9:30 Gwynn Burly, DO (infectious disease) 03/04/22 at 3:30  Demetrios Loll, BSN, RN-BC RN Care Coordinator [facility]  [facility] Direct Dial: [phone] Main #: [phone]

## 2022-02-22 NOTE — Discharge Summary (Signed)
Physician Discharge Summary   Patient: Juan Yang MRN: [id number] DOB: [birth year]  Admit date:     02/12/2022  Discharge date: 02/19/2022  Discharge Physician: Berle Mull  PCP: Baruch Gouty, FNP  Recommendations at discharge: Follow-up with PCP in 1 week. Follow-up infectious disease. Follow-up with podiatry as recommended   Follow-up Information     Rakes, Connye Burkitt, FNP. Schedule an appointment as soon as possible for a visit in 1 week(s).   Specialty: Family Medicine Contact information: [place] [address] [phone]         Mignon Pine, DO. Schedule an appointment as soon as possible for a visit in 3 week(s).   Specialties: Infectious Diseases, Internal Medicine Contact information: [address] [place] [address] [phone]         Criselda Peaches, DPM. Schedule an appointment as soon as possible for a visit in 1 week(s).   Specialty: Podiatry Contact information: [place] [address] [phone]                Discharge Diagnoses: Principal Problem:   Osteomyelitis [facility]) Active Problems:   Essential hypertension   Leukocytosis   Chronic diastolic CHF (congestive heart failure) (HCC)   Squamous cell lung cancer, right [facility])  Hospital Course: 67 year old man PMH including Charcot-Marie-Tooth, lower extremity neuropathy, recently underwent left great toe amputation 12/26.  Developed erythema January 1, seen by infectious disease January 3 and referred to the emergency department for MRI.  Imaging revealed osteomyelitis the patient was eventually admitted for further evaluation and treatment for osteomyelitis, infectious disease and podiatry consultations.  Podiatry initially skeptical of infection.  Podiatry now considering close follow-up with plans for operative intervention if no response. Assessment and Plan  Osteomyelitis left foot ([place]) MR left foot showed proximal phalanx  of the first digit concerning for osteomyelitis Podiatry was consulted as well as ID. initially podiatry thought to be bone marrow edema more likely than osteo, ID recommended aggressive management given his need for chemotherapy for the squamous cell carcinoma. Podiatry still feels that given his improvement in symptoms oral antibiotic with close observation would be the right approach and the patient also wants to follow that approach. Will discharge on oral antibiotics.   Dehydration Creatinine down to 1.2, appears to be at baseline    Chronic diastolic CHF (congestive heart failure) (HCC) Remains stable.  Continue metoprolol, furosemide   Essential hypertension Remains stable. Continue Norvasc, metoprolol   Squamous cell lung cancer, right ([place]) Recently diagnosed, has not yet started chemo, as infection has been a barrier to starting chemo  Pain control - [place] was reviewed. and patient was instructed, not to drive, operate heavy machinery, perform activities at heights, swimming or participation in water activities or provide baby-sitting services while on Pain, Sleep and Anxiety Medications; until their outpatient Physician has advised to do so again. Also recommended to not to take more than prescribed Pain, Sleep and Anxiety Medications.  Consultants:  Podiatry Infectious disease  Procedures performed:  None  DISCHARGE MEDICATION: Allergies as of 02/19/2022       Reactions   Other Hives   DUCK EGGS ONLY   Atenolol Diarrhea, Nausea Only   Pt unsure of allergy? Possible fainting?   Cymbalta [duloxetine Hcl] Other (See Comments)   Pt states it shut his bladder down and could not urinate; states it made him lethargic as well.   Latex  Itching   Penicillins Hives   Did it involve swelling of the face/tongue/throat, SOB, or low BP? Unknown Did it involve sudden or severe rash/hives, skin peeling, or any reaction on the  inside of your mouth or nose? Unknown Did you need to seek medical attention at a hospital or doctor's office? yes When did it last happen?      Childhood If all above answers are "NO", may proceed with cephalosporin use.        Medication List     STOP taking these medications    levofloxacin 750 MG tablet Commonly known as: LEVAQUIN       TAKE these medications    Adult Aspirin Regimen 81 MG tablet Generic drug: aspirin EC Take 81-325 mg by mouth daily.   amLODipine 2.5 MG tablet Commonly known as: NORVASC Take 2.5 mg by mouth 2 (two) times daily.   cefadroxil 1 g tablet Commonly known as: DURICEF Take 1 tablet (1 g total) by mouth 2 (two) times daily.   cyclobenzaprine 10 MG tablet Commonly known as: FLEXERIL Take 1 tablet (10 mg total) by mouth 3 (three) times daily as needed for muscle spasms.   doxycycline 100 MG tablet Commonly known as: VIBRA-TABS Take 1 tablet (100 mg total) by mouth 2 (two) times daily.   furosemide 40 MG tablet Commonly known as: LASIX Take 1 tablet (40 mg total) by mouth daily.   GLUCOSAMINE-MSM PO Take 1 tablet by mouth daily.   losartan 25 MG tablet Commonly known as: COZAAR Take 25 mg by mouth daily.   metoprolol succinate 25 MG 24 hr tablet Commonly known as: TOPROL-XL Take 1 tablet (25 mg total) by mouth daily.   naproxen sodium 220 MG tablet Commonly known as: ALEVE Take 220 mg by mouth daily as needed (pain).   ondansetron 4 MG tablet Commonly known as: Zofran Take 1 tablet (4 mg total) by mouth every 8 (eight) hours as needed for nausea or vomiting.   oxyCODONE 5 MG immediate release tablet Commonly known as: Oxy IR/ROXICODONE Take 1-2 tablets (5-10 mg total) by mouth every 4 (four) hours as needed for breakthrough pain or severe pain.   Potassium 99 MG Tabs Take 99 mg by mouth daily as needed (low potassium).   rosuvastatin 5 MG tablet Commonly known as: CRESTOR Take 1 tablet (5 mg total) by mouth daily.    sildenafil 100 MG tablet Commonly known as: VIAGRA Take 100 mg by mouth daily as needed for erectile dysfunction.   sodium bicarbonate 650 MG tablet Take 650 mg by mouth 2 (two) times daily.               Discharge Care Instructions  (From admission, onward)           Start     Ordered   02/19/22 0000  Discharge wound care:       Comments: apply triple antibiotic ointment as well as a gauze dressing with tape over the incision site.   02/19/22 1102           Disposition: Home Diet recommendation: Cardiac diet  Discharge Exam: Vitals:   02/18/22 0427 02/18/22 1326 02/18/22 1947 02/19/22 0550  BP: (!) 124/97 (!) 127/93 (!) 129/99 (!) 140/93  Pulse: 67 71 83 (!) 102  Resp: 20 17 20 18   Temp: (!) 97.5 F (36.4 C) 97.8 F (36.6 C) 98.1 F (36.7 C) (!) 97.4 F (36.3 C)  TempSrc:  Oral    SpO2: 100% 99% 98%  100%  Weight:      Height:       General: Appear in no distress; no visible Abnormal Neck Mass Or lumps, Conjunctiva normal Cardiovascular: S1 and S2 Present, no Murmur, Respiratory: good respiratory effort, Bilateral Air entry present and CTA, no Crackles, no wheezes Abdomen: Bowel Sound present, Non tender  Extremities: no Pedal edema Neurology: alert and oriented to time, place, and person   Filed Weights   02/12/22 1722 02/13/22 2122  Weight: 89.4 kg 91.3 kg   Condition at discharge: stable  The results of significant diagnostics from this hospitalization (including imaging, microbiology, ancillary and laboratory) are listed below for reference.   Imaging Studies: VAS [place] ABI WITH/WO TBI  Result Date: 02/15/2022  LOWER EXTREMITY DOPPLER STUDY Patient Name:  DOAK MAH  Date of Exam:   02/14/2022 Medical Rec #: [id number]         Accession #:    [id number] Date of Birth: Feb 10, 1956         Patient Gender: M Patient Age:   14 years Exam Location:  [facility] Procedure:      VAS [place] ABI WITH/WO TBI Referring Phys: REBECCA SIKORA  --------------------------------------------------------------------------------  Indications: Ulceration. High Risk Factors: Hypertension.  Vascular Interventions: Left great toe amputation. Comparison Study: No prior studies. Performing Technologist: Carlos Levering RVT  Examination Guidelines: A complete evaluation includes at minimum, Doppler waveform signals and systolic blood pressure reading at the level of bilateral brachial, anterior tibial, and posterior tibial arteries, when vessel segments are accessible. Bilateral testing is considered an integral part of a complete examination. Photoelectric Plethysmograph (PPG) waveforms and toe systolic pressure readings are included as required and additional duplex testing as needed. Limited examinations for reoccurring indications may be performed as noted.  ABI Findings: +---------+------------------+-----+---------+--------+ Right    Rt Pressure (mmHg)IndexWaveform Comment  +---------+------------------+-----+---------+--------+ Brachial 133                    triphasic         +---------+------------------+-----+---------+--------+ PTA      168               1.25 triphasic         +---------+------------------+-----+---------+--------+ DP       179               1.34 triphasic         +---------+------------------+-----+---------+--------+ Great Toe125               0.93                   +---------+------------------+-----+---------+--------+ +---------+------------------+-----+---------+----------+ Left     Lt Pressure (mmHg)IndexWaveform Comment    +---------+------------------+-----+---------+----------+ Brachial 134                    triphasic           +---------+------------------+-----+---------+----------+ PTA      161               1.20 triphasic           +---------+------------------+-----+---------+----------+ DP       170               1.27 triphasic            +---------+------------------+-----+---------+----------+ Great Toe                                Amputation +---------+------------------+-----+---------+----------+ +-------+-----------+-----------+------------+------------+  ABI/TBIToday's ABIToday's TBIPrevious ABIPrevious TBI +-------+-----------+-----------+------------+------------+ Right  1.34       0.93                                +-------+-----------+-----------+------------+------------+ Left   1.27                                           +-------+-----------+-----------+------------+------------+   Summary: Right: Resting right ankle-brachial index indicates noncompressible right lower extremity arteries. The right toe-brachial index is normal. Left: Resting left ankle-brachial index is within normal range. Unable to obtain TBI due to great toe amputation. *See table(s) above for measurements and observations.  Electronically signed by Gerarda Fraction on 02/15/2022 at 1:20:45 PM.    Final    MR FOOT LEFT WO CONTRAST  Result Date: 02/12/2022 CLINICAL DATA:  Soft tissue infection suspected. Infection at the toe amputation site. EXAM: MRI OF THE LEFT FOOT WITHOUT CONTRAST TECHNIQUE: Multiplanar, multisequence MR imaging of the left forefoot was performed. No intravenous contrast was administered. COMPARISON:  Radiographs dated February 12, 2022 and prior MRI examination dated January 13, 2022 FINDINGS: Bones/Joint/Cartilage Postsurgical changes for prior amputation through the base of the proximal phalanx of the first digit. There is marrow edema about the amputation site. Ligaments Lisfranc and collateral ligaments are intact. Muscles and Tendons Increased intrasubstance signal of the plantar muscles suggesting diabetic myopathy/myositis. No fluid collection or abscess. Soft tissues Skin wound about the amputation site with subjacent edema and inflammatory changes. Generalized subcutaneous edema about the dorsum of the foot. No  fluid collection. IMPRESSION: 1. Bone marrow edema about the amputation site of the residual proximal phalanx of the first digit concerning for osteomyelitis. 2. Skin wound about the amputation site with subjacent edema and inflammatory changes. No fluid collection or abscess. 3. Increased intrasubstance signal of the plantar muscles suggesting diabetic myopathy/myositis. Electronically Signed   By: Larose Hires D.O.   On: 02/12/2022 21:31   DG Foot Complete Left  Result Date: 02/12/2022 CLINICAL DATA:  Great toe pain EXAM: LEFT FOOT - COMPLETE 3+ VIEW COMPARISON:  01/01/2022 FINDINGS: Status post partial amputation first digit at the level of proximal phalanx. Cut margins appear smooth. No erosive change. No soft tissue emphysema. IMPRESSION: Status post partial amputation of the first digit. No acute osseous abnormality. Electronically Signed   By: Jasmine Pang M.D.   On: 02/12/2022 18:51    Microbiology: Results for orders placed or performed during the hospital encounter of 02/12/22  Blood culture (routine x 2)     Status: None   Collection Time: 02/12/22  8:07 PM   Specimen: Left Antecubital; Blood  Result Value Ref Range Status   Specimen Description   Final    LEFT ANTECUBITAL BOTTLES DRAWN AEROBIC AND ANAEROBIC   Special Requests Blood Culture adequate volume  Final   Culture   Final    NO GROWTH 5 DAYS Performed at [facility], [address], [place], [place] [address]    Report Status 02/17/2022 FINAL  Final  Blood culture (routine x 2)     Status: None   Collection Time: 02/12/22  8:14 PM   Specimen: Right Antecubital; Blood  Result Value Ref Range Status   Specimen Description   Final    RIGHT ANTECUBITAL BOTTLES DRAWN AEROBIC AND ANAEROBIC   Special Requests Blood  Culture adequate volume  Final   Culture   Final    NO GROWTH 5 DAYS Performed at [facility], [address] [place], [place] [address]    Report Status 02/17/2022 FINAL  Final   Labs: CBC: Recent Labs   Lab 02/19/22 0546  WBC 11.2*  NEUTROABS 7.7  HGB 14.5  HCT 43.5  MCV 90.2  PLT 931   Basic Metabolic Panel: Recent Labs  Lab 02/16/22 0548 02/17/22 0624 02/18/22 0536  NA 136 135 135  K 3.5 3.3* 3.9  CL 104 101 105  CO2 24 24 22   GLUCOSE 114* 158* 103*  BUN 17 16 14   CREATININE 1.35* 1.20 1.10  CALCIUM 8.5* 8.8* 8.8*   Liver Function Tests: No results for input(s): "AST", "ALT", "ALKPHOS", "BILITOT", "PROT", "ALBUMIN" in the last 168 hours. CBG: No results for input(s): "GLUCAP" in the last 168 hours.  Discharge time spent: greater than 30 minutes.  Signed: Berle Mull, MD [facility] 02/19/2022

## 2022-02-25 ENCOUNTER — Encounter: Payer: Medicare Other | Admitting: Podiatry

## 2022-02-26 ENCOUNTER — Ambulatory Visit (INDEPENDENT_AMBULATORY_CARE_PROVIDER_SITE_OTHER): Payer: PPO | Admitting: Podiatry

## 2022-02-26 DIAGNOSIS — Z9889 Other specified postprocedural states: Secondary | ICD-10-CM

## 2022-02-26 NOTE — Progress Notes (Signed)
Chief Complaint  Patient presents with   Routine Post Op    POV #2 DOS 02/04/2022 PARTIAL TOE AMPUTATION LT GREAT TOE, aware of [place] location, NO N/V/F/C/SOB, PATIENT DENIES ANY PAIN     Subjective:  Patient presents today status post partial great toe amputation to the left foot.  DOS: 02/04/2022.  Patient developed subsequent erythema around the amputation site and was admitted and placed on IV cefazolin and vancomycin.  Observation while inpatient.  No repeat or revisional surgery.  Discharged 02/19/2022.  Currently on Duricef and Doxycycline as per ID.  WBAT postsurgical shoe.  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   History of kidney stones    Hypertension    Peripheral neuropathy 10/29/2016   Pneumonia    Transient cerebral ischemic attack, unspecified 05/10/2020   stroke    Past Surgical History:  Procedure Laterality Date   BRONCHIAL NEEDLE ASPIRATION BIOPSY  11/29/2021   Procedure: BRONCHIAL NEEDLE ASPIRATION BIOPSIES;  Surgeon: Josephine Igo, DO;  Location: [facility] ENDOSCOPY;  Service: Pulmonary;;   CHOLECYSTECTOMY N/A 12/13/2021   Procedure: LAPAROSCOPIC CHOLECYSTECTOMY;  Surgeon: Lucretia Roers, MD;  Location: [facility] ORS;  Service: General;  Laterality: N/A;   COLONOSCOPY WITH PROPOFOL N/A 11/14/2016   Procedure: COLONOSCOPY WITH PROPOFOL;  Surgeon: Malissa Hippo, MD;  Location: [facility] SUITE;  Service: Endoscopy;  Laterality: N/A;  9:15   IR IMAGING GUIDED PORT INSERTION  12/24/2021   LUMBAR LAMINECTOMY/DECOMPRESSION  MICRODISCECTOMY Right 05/24/2019   Procedure: LAMINECTOMY AND FORAMINOTOMY LUMBAR FIVE- SACRAL ONE RIGHT;  Surgeon: Bedelia Person, MD;  Location[facility] OR;  Service: Neurosurgery;  Laterality: Right;  posterior/right   LUNG BIOPSY     MOHS SURGERY Right 05/2020   ear   MOHS SURGERY     POLYPECTOMY  11/14/2016   Procedure: POLYPECTOMY- SIGMOID COLON X3 TRANSVERSE COLON X2;  Surgeon: Malissa Hippo, MD;  Location: [facility];  Service: Endoscopy;;   rt ankle surgery     Titanium hinge   TONSILLECTOMY     TRANSFORAMINAL LUMBAR INTERBODY FUSION (TLIF) WITH PEDICLE SCREW FIXATION 1 LEVEL N/A 05/02/2020   Procedure: Lumbar five Sacral one Transforaminal lumbar interbody fusion;  Surgeon: Bedelia Person, MD;  Location: [facility] OR;  Service: Neurosurgery;  Laterality: N/A;   VIDEO BRONCHOSCOPY WITH ENDOBRONCHIAL ULTRASOUND N/A 11/29/2021   Procedure: VIDEO BRONCHOSCOPY WITH ENDOBRONCHIAL ULTRASOUND;  Surgeon: Josephine Igo, DO;  Location: [facility] ENDOSCOPY;  Service: Pulmonary;  Laterality: N/A;    Allergies  Allergen Reactions   Other Hives    DUCK EGGS ONLY   Atenolol Diarrhea and Nausea Only    Pt unsure of allergy? Possible fainting?   Cymbalta [Duloxetine Hcl] Other (See Comments)    Pt states it shut his bladder down and could not urinate; states it made him lethargic as well.   Latex Itching   Penicillins Hives    Did it involve  swelling of the face/tongue/throat, SOB, or low BP? Unknown Did it involve sudden or severe rash/hives, skin peeling, or any reaction on the inside of your mouth or nose? Unknown Did you need to seek medical attention at a hospital or doctor's office? yes When did it last happen?      Childhood If all above answers are "NO", may proceed with cephalosporin use.    Objective/Physical Exam Neurovascular status intact.  Incision well coapted with sutures intact.  The erythema and edema around the toe appears to be completely resolved.  Clinically there is no  indication of cellulitis.  Well-healing amputation site  Assessment: 1. s/p left great toe amputation. DOS: 02/04/2022 -Patient evaluated -Sutures removed -The amputation site appears to be healing very nicely.  The skin is well coapted and adhered. -Continue antibiotics as per ID.  Greatly appreciated -Continue WBAT postsurgical shoe -Return to clinic 1 week   Felecia Shelling, DPM [facility]  Dr. Felecia Shelling, DPM    [address] [place], [place] [address]                Office [phone]  Fax [phone]

## 2022-03-03 DIAGNOSIS — M549 Dorsalgia, unspecified: Secondary | ICD-10-CM | POA: Diagnosis not present

## 2022-03-03 DIAGNOSIS — M5416 Radiculopathy, lumbar region: Secondary | ICD-10-CM | POA: Diagnosis not present

## 2022-03-04 ENCOUNTER — Ambulatory Visit: Payer: PPO | Admitting: Internal Medicine

## 2022-03-04 NOTE — Progress Notes (Deleted)
[facility]  CHIEF COMPLAINT:    Follow up for osteomyelitis  SUBJECTIVE:    Juan Yang is a 67 y.o. male with PMHx as below who presents to the clinic for osteomyelitis.   Patient here today for follow up.  He was diagnosed with osteomyelitis in the setting of a neuropathic ulcer of his right great toe in December 2023.  He was taken to the OR for amputation with Dr Logan Bores on 02/04/22 where it was felt that margins from the OR were likely clean.  Cultures were not obtained but the residual bone was pretty hard and healthy, per podiatry.  He was started on Doxycycline and Augmentin post surgery then changed to Doxy and Levaquin due to possible Augmentin rash.  He was then admitted to the hospital 02/12/22 - 02/19/22 due to worsening cellulitis.    He was treated with IV antibiotics and MRI showed:  IMPRESSION: 1. Bone marrow edema about the amputation site of the residual proximal phalanx of the first digit concerning for osteomyelitis. 2. Skin wound about the amputation site with subjacent edema and inflammatory changes. No fluid collection or abscess. 3. Increased intrasubstance signal of the plantar muscles suggesting diabetic myopathy/myositis.   He showed improvement on IV antibiotics that he received for 1 week.  Podiatry deferred further intervention and patient was discharged home on a PO regimen of Doxycycline and Cefadroxil to complete 6 weeks of antibiotics on 03/27/22.  It was not entirely clear if the MR findings represented edema more so than osteo.  He had follow up with Dr Logan Bores on 02/26/22 and appeared to have a well healing incision at that time.  Reports today ***.      Please see A&P for the details of today's visit and status of the patient's medical problems.   Patient's Medications  New Prescriptions   No medications on file  Previous Medications   AMLODIPINE (NORVASC) 2.5 MG TABLET    Take 2.5 mg by mouth 2 (two) times  daily.   ASPIRIN EC (ADULT ASPIRIN REGIMEN) 81 MG TABLET    Take 81-325 mg by mouth daily.   CEFADROXIL (DURICEF) 1 G TABLET    Take 1 tablet (1 g total) by mouth 2 (two) times daily.   CYCLOBENZAPRINE (FLEXERIL) 10 MG TABLET    Take 1 tablet (10 mg total) by mouth 3 (three) times daily as needed for muscle spasms.   DOXYCYCLINE (VIBRA-TABS) 100 MG TABLET    Take 1 tablet (100 mg total) by mouth 2 (two) times daily.   FUROSEMIDE (LASIX) 40 MG TABLET    Take 1 tablet (40 mg total) by mouth daily.   GLUCOSAMINE HCL-MSM (GLUCOSAMINE-MSM PO)    Take 1 tablet by mouth daily.   LOSARTAN (COZAAR) 25 MG TABLET    Take 25 mg by mouth daily.   METOPROLOL SUCCINATE (TOPROL-XL) 25 MG 24 HR TABLET    Take 1 tablet (25 mg total) by mouth daily.   NAPROXEN SODIUM (ALEVE) 220 MG TABLET    Take 220 mg by mouth daily as needed (pain).   ONDANSETRON (ZOFRAN) 4 MG TABLET    Take 1 tablet (4 mg total) by mouth every 8 (eight) hours as needed for nausea or vomiting.   OXYCODONE (OXY IR/ROXICODONE) 5 MG IMMEDIATE RELEASE TABLET    Take 1-2 tablets (5-10 mg total) by mouth every 4 (four) hours as needed for breakthrough pain or severe pain.   POTASSIUM 99  MG TABS    Take 99 mg by mouth daily as needed (low potassium).   ROSUVASTATIN (CRESTOR) 5 MG TABLET    Take 1 tablet (5 mg total) by mouth daily.   SILDENAFIL (VIAGRA) 100 MG TABLET    Take 100 mg by mouth daily as needed for erectile dysfunction.   SODIUM BICARBONATE 650 MG TABLET    Take 650 mg by mouth 2 (two) times daily.  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   History of kidney stones    Hypertension    Peripheral neuropathy 10/29/2016   Pneumonia    Transient cerebral ischemic attack, unspecified 05/10/2020   stroke    Social History   Tobacco Use   Smoking status: Former    Packs/day: 1.50    Years: 20.00    Total pack years: 30.00    Types: Cigarettes    Quit date: 11/12/2012    Years since quitting: 9.3    Passive exposure: Never   Smokeless tobacco: Never   Tobacco comments:    Pt smokes 1 pack a month and vapes CBD oil. ARJ 11/22/21  Vaping Use   Vaping Use: Former   Quit date: 12/18/2021   Substances: CBD  Substance Use Topics   Alcohol use: No   Drug use: No    Family History  Problem Relation Age of Onset   Charcot-Marie-Tooth disease Mother    Heart disease Father    COPD Father    Arthritis Sister     Allergies  Allergen Reactions   Other Hives    DUCK EGGS ONLY   Atenolol Diarrhea and Nausea Only    Pt unsure of allergy? Possible fainting?   Cymbalta [Duloxetine Hcl] Other (See Comments)    Pt states it shut his bladder down and could not urinate; states it made him lethargic as well.   Latex Itching   Penicillins Hives    Did it involve swelling of the face/tongue/throat, SOB, or low BP? Unknown Did it involve sudden or severe rash/hives, skin peeling, or any reaction on the inside of your mouth or nose? Unknown Did you need to seek medical attention at a hospital or doctor's office? yes When did it last happen?      Childhood If all above answers are "NO", may proceed with cephalosporin use.    ROS   OBJECTIVE:    There were no vitals filed for this visit. There is no height or weight on file to calculate BMI.  Physical Exam   Labs and Microbiology:    Latest Ref Rng & Units 02/19/2022    5:46 AM 02/14/2022    5:41 AM 02/13/2022    7:43 AM  CBC  WBC 4.0 - 10.5 K/uL 11.2   11.7  11.5   Hemoglobin 13.0 - 17.0 g/dL 14.5  14.0  13.7   Hematocrit 39.0 - 52.0 % 43.5  42.9  40.2   Platelets 150 - 400 K/uL 225  242  249       Latest Ref Rng & Units 02/18/2022    5:36 AM 02/17/2022    6:24 AM 02/16/2022    5:48 AM  CMP  Glucose 70 - 99 mg/dL 103  158  114   BUN 8 - 23 mg/dL 14  16  17    Creatinine 0.61 - 1.24 mg/dL 1.10  1.20  1.35   Sodium 135 - 145 mmol/L 135  135  136   Potassium 3.5 - 5.1 mmol/L 3.9  3.3  3.5   Chloride 98 - 111 mmol/L 105  101  104   CO2 22 - 32 mmol/L 22  24  24    Calcium 8.9 - 10.3 mg/dL 8.8  8.8  8.5      No results found for this or any previous visit (from the past 240 hour(s)).  Imaging: ***   ASSESSMENT & PLAN:    No problem-specific Assessment & Plan notes found for this encounter.   No orders of the defined types were placed in this encounter.    There are no diagnoses linked to this encounter.  Patient doing well at this time with no evidence or concern for soft tissue cellulitis.  Unclear if most recent MRI showed residual osteo or edema but will err on side of treatment and continue cefadroxil 1gm PO BID and doxycycline 100mg  PO BID x 6 weeks through 03/27/22.  Follow up again in 4 weeks.    [facility] 03/04/2022, 8:01 AM

## 2022-03-12 ENCOUNTER — Ambulatory Visit: Payer: PPO | Admitting: Internal Medicine

## 2022-03-12 NOTE — Progress Notes (Deleted)
Patient Active Problem List   Diagnosis Date Noted   Osteomyelitis ([place]) 01/13/2022   Nicotine abuse 12/31/2021   Sinus tachycardia by electrocardiogram 12/31/2021   Dyspnea on minimal exertion 12/31/2021   Coronary artery calcification seen on CAT scan 12/31/2021   Squamous cell lung cancer, right ([place]) 12/18/2021   Calculus of gallbladder with acute cholecystitis without obstruction 12/12/2021   Leukocytosis 12/12/2021   Hypokalemia 12/12/2021   Hyponatremia 12/12/2021   Lung cancer ([place]) 12/12/2021   Chronic diastolic CHF (congestive heart failure) ([place]) 12/12/2021   Hilar adenopathy 11/22/2021   Abdominal aortic aneurysm (AAA) 3.0 cm to 5.5 cm in diameter in male [facility]) 10/03/2021   Secondary hyperparathyroidism ([place]) 07/22/2020   Vitamin D deficiency 07/22/2020   Anxiety and depression 07/22/2020   MGUS (monoclonal gammopathy of unknown significance) 07/22/2020   History of COPD 07/22/2020   Tobacco use disorder 07/22/2020   Fatty liver 07/22/2020   Essential hypertension 07/22/2020   DDD (degenerative disc disease), lumbar 07/22/2020   Hypercholesteremia 06/30/2020   Stage 3a chronic kidney disease ([place]) 06/30/2020   Lumbar radiculopathy 05/02/2020   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   ASPIRIN EC (ADULT ASPIRIN REGIMEN) 81 MG TABLET    Take 81-325 mg by mouth daily.   CEFADROXIL (DURICEF) 1 G TABLET    Take 1 tablet (1 g total) by mouth 2 (two) times daily.   CYCLOBENZAPRINE (FLEXERIL) 10 MG TABLET    Take 1 tablet (10 mg total) by mouth 3 (three) times daily as needed for muscle spasms.   DOXYCYCLINE (VIBRA-TABS) 100 MG TABLET    Take 1 tablet (100 mg total) by mouth 2 (two) times  daily.   FUROSEMIDE (LASIX) 40 MG TABLET    Take 1 tablet (40 mg total) by mouth daily.   GLUCOSAMINE HCL-MSM (GLUCOSAMINE-MSM PO)    Take 1 tablet by mouth daily.   LOSARTAN (COZAAR) 25 MG TABLET    Take 25 mg by mouth daily.   METOPROLOL SUCCINATE (TOPROL-XL) 25 MG 24 HR TABLET    Take 1 tablet (25 mg total) by mouth daily.   NAPROXEN SODIUM (ALEVE) 220 MG TABLET    Take 220 mg by mouth daily as needed (pain).   ONDANSETRON (ZOFRAN) 4 MG TABLET    Take 1 tablet (4 mg total) by mouth every 8 (eight) hours as needed for nausea or vomiting.   OXYCODONE (OXY IR/ROXICODONE) 5 MG IMMEDIATE RELEASE TABLET    Take 1-2 tablets (5-10 mg total) by mouth every 4 (four) hours as needed for breakthrough pain or severe pain.   POTASSIUM 99 MG TABS    Take 99 mg by mouth daily as needed (low potassium).   ROSUVASTATIN (CRESTOR) 5 MG TABLET    Take 1 tablet (5 mg total) by mouth daily.   SILDENAFIL (VIAGRA) 100 MG TABLET    Take 100 mg by mouth daily as needed for erectile dysfunction.   SODIUM BICARBONATE 650 MG TABLET    Take 650 mg by mouth 2 (two) times daily.  Modified Medications   No medications on file  Discontinued Medications   No medications on file    Subjective: Juan Yang  is a 67 year old male with past medical history as below presents for management of first toe osteomyelitis.  Patient had an MRI done which showed left great toe osteomyelitis.  That he would hold antibiotics x 1 week prior to surgery.  Plan to do about 2 weeks of postop antibiotics possibly longer depending on wound healing.  Noted that p.o. preferred over IV to avoid PICC line access for antibiotics.  Sent prescription for doxycycline and Augmentin x 2 weeks based on his description.  On 12/26 patient underwent partial toe amputation of the left great toe.  He had some bleeding from amputation toe site, antibiotics noted to be changed from Doxy and Augmentin to Doxy and Levaquin.  They never changed antibiotics.  Presented  today as there is concern for worsening redness from amputation site which is tracked into his foot. Reports feeling fatigued.   Interim: sent to ED at last visit as there was concern for ongoing infection. MRI showed bone marrow edema around amputation site of his proximal phalanx of the first digit concerning for osteomyelitis.  No abscess patient was started on broad-spectrum antibiotics with vancomycin and cefazolin.  ABIs on 1/6 showed right lower extremity arteries noncompressible, toe brachial index normal.  Podiatry and infectious disease consulted.  Podiatry plan to monitor with follow-up in 1 to 2 weeks.  Patient discharged on cefadroxil and doxycycline x 6 weeks EOT 2/15.  Seen by podiatry Dr. Hilaria Ota on 1/17 noted amputation site appears to be healing nicely.  Sutures removed.  Follow-up in 1 week.  Review of Systems: Review of Systems  All other systems reviewed and are negative.   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   History of kidney stones    Hypertension    Peripheral neuropathy 10/29/2016   Pneumonia    Transient cerebral ischemic attack, unspecified 05/10/2020   stroke    Social History   Tobacco Use   Smoking status: Former    Packs/day: 1.50    Years: 20.00    Total pack years: 30.00    Types: Cigarettes    Quit date: 11/12/2012    Years since quitting: 9.3    Passive exposure: Never   Smokeless tobacco: Never   Tobacco comments:    Pt smokes 1 pack a month and vapes CBD oil. ARJ 11/22/21  Vaping Use   Vaping Use: Former   Quit date: 12/18/2021    Substances: CBD  Substance Use Topics   Alcohol use: No   Drug use: No    Family History  Problem Relation Age of Onset   Charcot-Marie-Tooth disease Mother    Heart disease Father    COPD Father    Arthritis Sister     Allergies  Allergen Reactions   Other Hives    DUCK EGGS ONLY   Atenolol Diarrhea and Nausea Only    Pt unsure of allergy? Possible fainting?   Cymbalta [Duloxetine Hcl] Other (See Comments)  Pt states it shut his bladder down and could not urinate; states it made him lethargic as well.   Latex Itching   Penicillins Hives    Did it involve swelling of the face/tongue/throat, SOB, or low BP? Unknown Did it involve sudden or severe rash/hives, skin peeling, or any reaction on the inside of your mouth or nose? Unknown Did you need to seek medical attention at a hospital or doctor's office? yes When did it last happen?      Childhood If all above answers are "NO", may proceed with cephalosporin use.    Health Maintenance  Topic Date Due   COVID-19 Vaccine (1) Never done   Zoster Vaccines- Shingrix (1 of 2) Never done   DTaP/Tdap/Td (2 - Tdap) 11/12/2014   Medicare Annual Wellness (AWV)  09/13/2019   COLONOSCOPY (Pts 45-110yr Insurance coverage will need to be confirmed)  11/14/2021   INFLUENZA VACCINE  05/11/2022 (Originally 09/10/2021)   Pneumonia Vaccine 67 Years old (1 - PCV) 10/04/2022 (Originally 06/09/2020)   Hepatitis C Screening  Completed   HPV VACCINES  Aged Out   Lung Cancer Screening  Discontinued    Objective:  There were no vitals filed for this visit. There is no height or weight on file to calculate BMI.  Physical Exam Constitutional:      General: He is not in acute distress.    Appearance: He is normal weight. He is not toxic-appearing.  HENT:     Head: Normocephalic and atraumatic.     Right Ear: External ear normal.     Left Ear: External ear normal.     Nose: No congestion or rhinorrhea.     Mouth/Throat:     Mouth: Mucous  membranes are moist.     Pharynx: Oropharynx is clear.  Eyes:     Extraocular Movements: Extraocular movements intact.     Conjunctiva/sclera: Conjunctivae normal.     Pupils: Pupils are equal, round, and reactive to light.  Cardiovascular:     Rate and Rhythm: Normal rate and regular rhythm.     Heart sounds: No murmur heard.    No friction rub. No gallop.  Pulmonary:     Effort: Pulmonary effort is normal.     Breath sounds: Normal breath sounds.  Abdominal:     General: Abdomen is flat. Bowel sounds are normal.     Palpations: Abdomen is soft.  Musculoskeletal:        General: No swelling. Normal range of motion.     Cervical back: Normal range of motion and neck supple.  Skin:    General: Skin is warm and dry.  Neurological:     General: No focal deficit present.     Mental Status: He is oriented to person, place, and time.  Psychiatric:        Mood and Affect: Mood normal.     Lab Results Lab Results  Component Value Date   WBC 11.2 (H) 02/19/2022   HGB 14.5 02/19/2022   HCT 43.5 02/19/2022   MCV 90.2 02/19/2022   PLT 225 02/19/2022    Lab Results  Component Value Date   CREATININE 1.10 02/18/2022   BUN 14 02/18/2022   NA 135 02/18/2022   K 3.9 02/18/2022   CL 105 02/18/2022   CO2 22 02/18/2022    Lab Results  Component Value Date   ALT 12 02/13/2022   AST 16 02/13/2022   ALKPHOS 115 02/13/2022   BILITOT 0.8 02/13/2022    Lab  Results  Component Value Date   CHOL 185 01/01/2022   HDL 32 (L) 01/01/2022   LDLCALC 117 (H) 01/01/2022   TRIG 204 (H) 01/01/2022   CHOLHDL 5.8 (H) 01/01/2022   No results found for: "LABRPR", "RPRTITER" No results found for: "HIV1RNAQUANT", "HIV1RNAVL", "CD4TABS"   Problem List Items Addressed This Visit   None     Laurice Record, MD [place] [facility] 03/12/2022, 12:59 PM

## 2022-03-13 ENCOUNTER — Inpatient Hospital Stay (HOSPITAL_COMMUNITY): Admission: RE | Admit: 2022-03-13 | Payer: PPO | Source: Ambulatory Visit

## 2022-03-13 ENCOUNTER — Ambulatory Visit (HOSPITAL_COMMUNITY)
Admission: RE | Admit: 2022-03-13 | Discharge: 2022-03-13 | Disposition: A | Discharge status: Home / Self Care | Payer: PPO | Source: Ambulatory Visit | Attending: Hematology | Admitting: Hematology

## 2022-03-13 DIAGNOSIS — I2510 Atherosclerotic heart disease of native coronary artery without angina pectoris: Secondary | ICD-10-CM | POA: Diagnosis not present

## 2022-03-13 DIAGNOSIS — C3491 Malignant neoplasm of unspecified part of right bronchus or lung: Secondary | ICD-10-CM | POA: Diagnosis not present

## 2022-03-13 DIAGNOSIS — C3490 Malignant neoplasm of unspecified part of unspecified bronchus or lung: Secondary | ICD-10-CM | POA: Diagnosis not present

## 2022-03-13 DIAGNOSIS — Z8579 Personal history of other malignant neoplasms of lymphoid, hematopoietic and related tissues: Secondary | ICD-10-CM | POA: Diagnosis not present

## 2022-03-13 DIAGNOSIS — I714 Abdominal aortic aneurysm, without rupture, unspecified: Secondary | ICD-10-CM | POA: Diagnosis not present

## 2022-03-13 MED ORDER — FLUDEOXYGLUCOSE F - 18 (FDG) INJECTION
10.2700 | Freq: Once | INTRAVENOUS | Status: AC | PRN
  Administered 2022-03-13: 10.27 via INTRAVENOUS

## 2022-03-18 ENCOUNTER — Telehealth: Payer: Self-pay | Admitting: Podiatry

## 2022-03-18 ENCOUNTER — Encounter: Payer: Medicare Other | Admitting: Podiatry

## 2022-03-18 ENCOUNTER — Encounter: Payer: PPO | Admitting: Thoracic Surgery (Cardiothoracic Vascular Surgery)

## 2022-03-18 NOTE — Telephone Encounter (Signed)
Pt cxled post op # 3 appt for today. It was scheduled in [place] by mistake but he did not feel he needed to reschedule. He stated the stiches were removed and no infection and is doing great. He will call if needed

## 2022-03-19 ENCOUNTER — Ambulatory Visit (HOSPITAL_COMMUNITY)
Admission: RE | Admit: 2022-03-19 | Discharge: 2022-03-19 | Disposition: A | Discharge status: Home / Self Care | Payer: PPO | Source: Ambulatory Visit | Attending: Hematology | Admitting: Hematology

## 2022-03-19 DIAGNOSIS — R062 Wheezing: Secondary | ICD-10-CM | POA: Diagnosis not present

## 2022-03-19 DIAGNOSIS — Z87891 Personal history of nicotine dependence: Secondary | ICD-10-CM | POA: Diagnosis not present

## 2022-03-19 DIAGNOSIS — C3491 Malignant neoplasm of unspecified part of right bronchus or lung: Secondary | ICD-10-CM | POA: Diagnosis present

## 2022-03-19 LAB — PULMONARY FUNCTION TEST
DL/VA % pred: 101 %
DL/VA: 4.16 ml/min/mmHg/L
DLCO unc % pred: 93 %
DLCO unc: 26.32 ml/min/mmHg
FEF 25-75 Post: 2.18 L/sec
FEF 25-75 Pre: 1.89 L/sec
FEF2575-%Change-Post: 15 %
FEF2575-%Pred-Post: 76 %
FEF2575-%Pred-Pre: 66 %
FEV1-%Change-Post: 2 %
FEV1-%Pred-Post: 85 %
FEV1-%Pred-Pre: 83 %
FEV1-Post: 3.12 L
FEV1-Pre: 3.04 L
FEV1FVC-%Change-Post: 3 %
FEV1FVC-%Pred-Pre: 93 %
FEV6-%Change-Post: 2 %
FEV6-%Pred-Post: 91 %
FEV6-%Pred-Pre: 88 %
FEV6-Post: 4.27 L
FEV6-Pre: 4.15 L
FEV6FVC-%Change-Post: 3 %
FEV6FVC-%Pred-Post: 103 %
FEV6FVC-%Pred-Pre: 100 %
FVC-%Change-Post: 0 %
FVC-%Pred-Post: 88 %
FVC-%Pred-Pre: 88 %
FVC-Post: 4.33 L
FVC-Pre: 4.35 L
Post FEV1/FVC ratio: 72 %
Post FEV6/FVC ratio: 99 %
Pre FEV1/FVC ratio: 70 %
Pre FEV6/FVC Ratio: 95 %
RV % pred: 140 %
RV: 3.48 L
TLC % pred: 104 %
TLC: 7.73 L

## 2022-03-19 MED ORDER — ALBUTEROL SULFATE (2.5 MG/3ML) 0.083% IN NEBU
2.5000 mg | INHALATION_SOLUTION | Freq: Once | RESPIRATORY_TRACT | Status: AC
  Administered 2022-03-19: 2.5 mg via RESPIRATORY_TRACT

## 2022-03-20 ENCOUNTER — Encounter: Payer: PPO | Admitting: Thoracic Surgery (Cardiothoracic Vascular Surgery)

## 2022-03-20 IMAGING — MR MR LUMBAR SPINE WO/W CM
5 of 8 series · 30 of 48 positions shown · IV contrast (gadavist)
Comparison: Lumbar MRI 12/01/2019. Intraoperative lumbar
radiographs 05/02/2020. lumbar radiographs 03/04/2021.

CLINICAL DATA: 65-year-old male with persistent low back pain.
Prior surgery.

EXAM:
MRI LUMBAR SPINE WITHOUT AND WITH CONTRAST
TECHNIQUE: Multiplanar and multiecho pulse sequences of the lumbar spine were
obtained without and with intravenous contrast.
CONTRAST:  10mL GADAVIST GADOBUTROL 1 MMOL/ML IV SOLN

[Series 5: T2 · sagittal · 4.0mm · 0.68mm/px · 5 of 16 slices shown (1 of 2)]
[im 1/16]
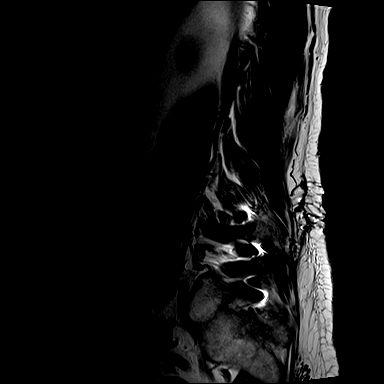
[im 4/16]
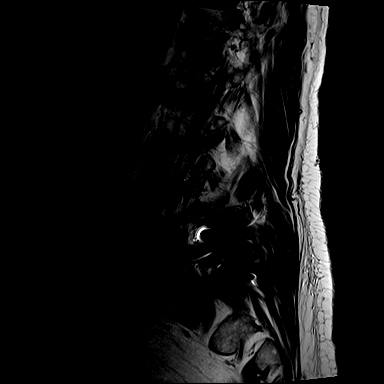
[im 8/16]
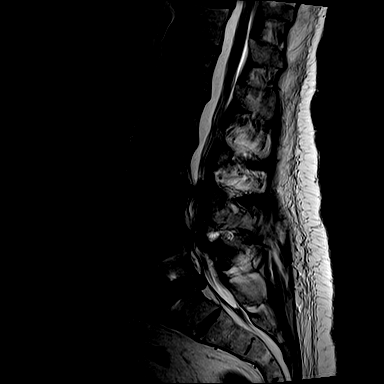
[im 12/16]
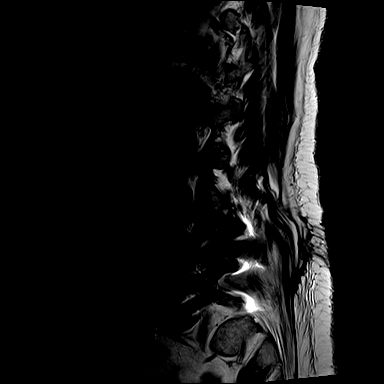
[im 16/16]
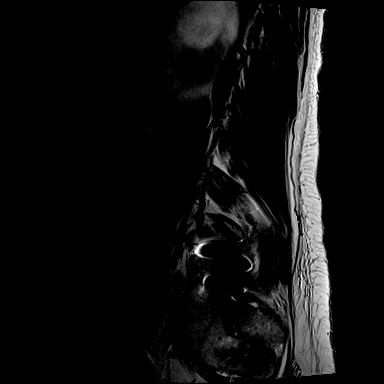

[Series 6: T1 · sagittal · 4.0mm · 0.81mm/px · 4 of 15 slices shown (1 of 2)]
[im 1/15]
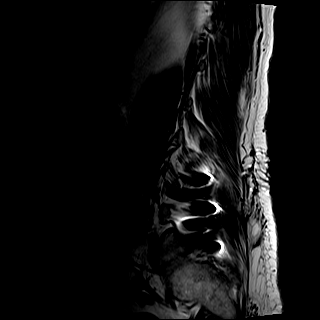
[im 5/15]
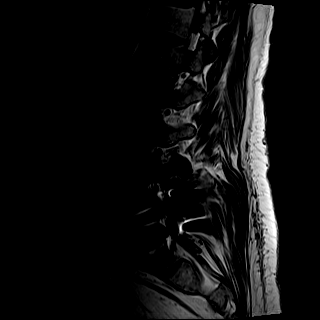
[im 10/15]
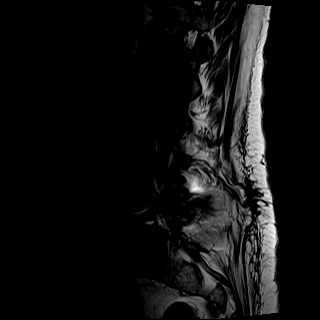
[im 15/15]
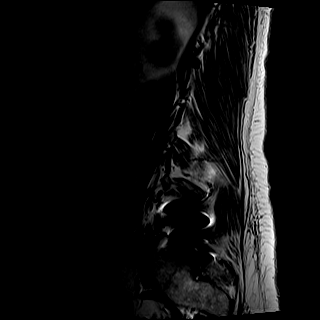

[Series 8: T2 · axial · 4.0mm · 0.70mm/px · z∈[-60,+173]mm · 9 of 37 slices shown (2 of 2)]
[im 1/37]
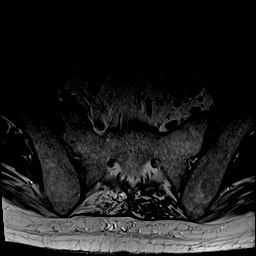
[im 5/37]
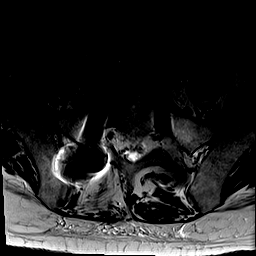
[im 10/37]
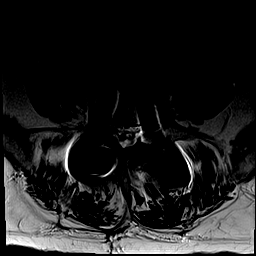
[im 14/37]
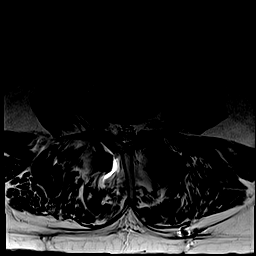
[im 19/37]
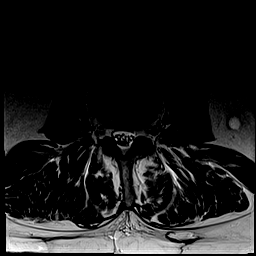
[im 23/37]
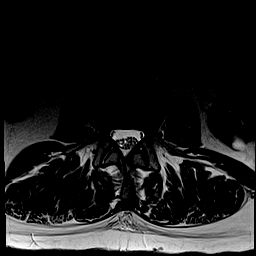
[im 28/37]
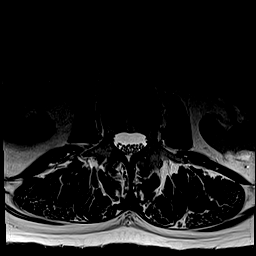
[im 32/37]
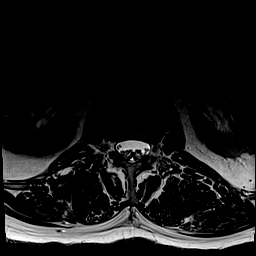
[im 37/37]
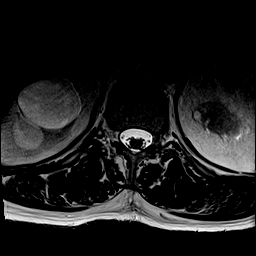

[Series 9: T1 · axial · 4.0mm · 0.35mm/px · z∈[-60,+173]mm · 9 of 37 slices shown (2 of 2)]
[im 1/37]
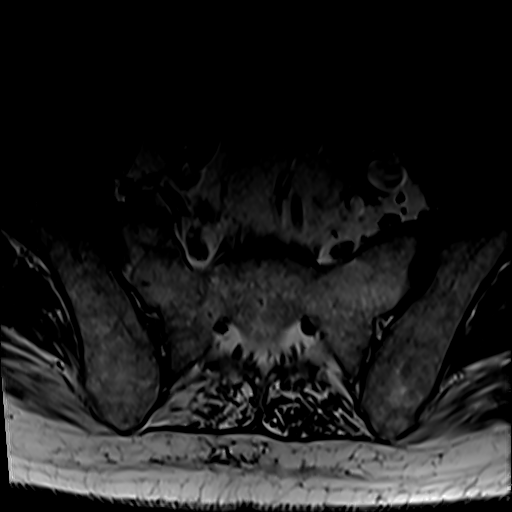
[im 5/37]
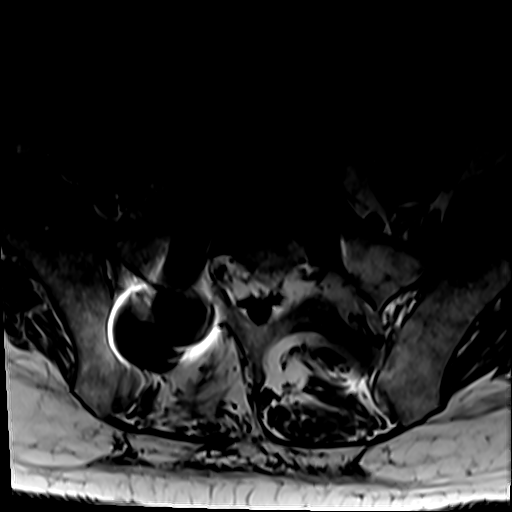
[im 10/37]
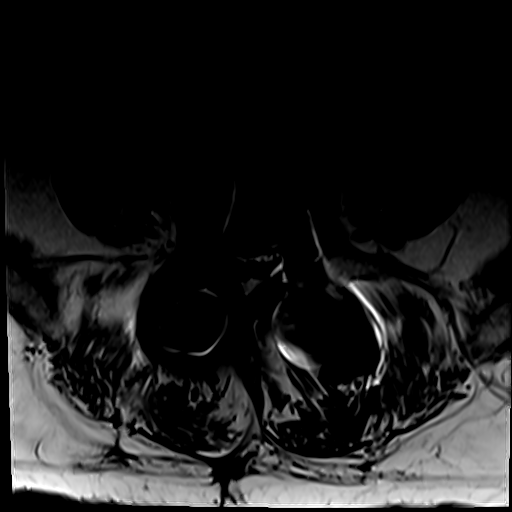
[im 14/37]
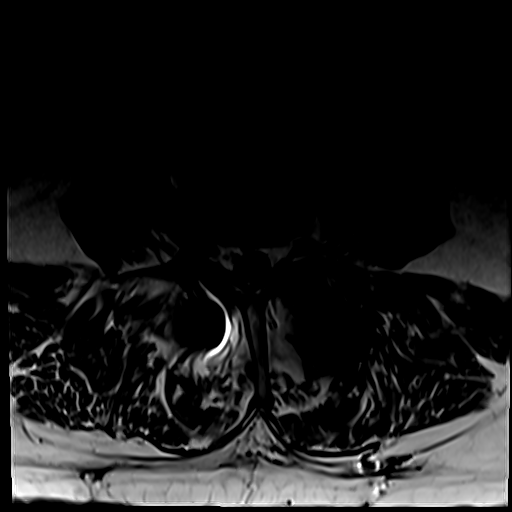
[im 19/37]
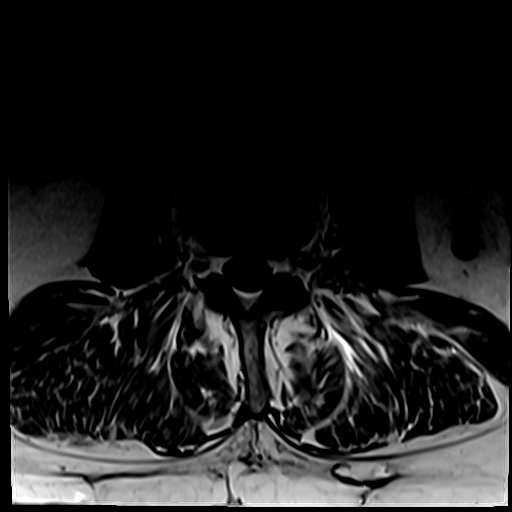
[im 23/37]
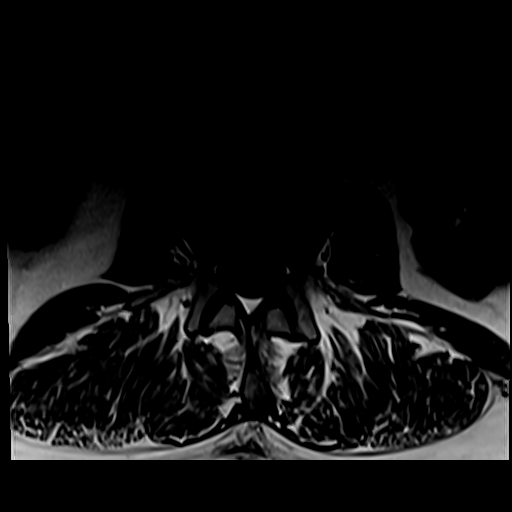
[im 28/37]
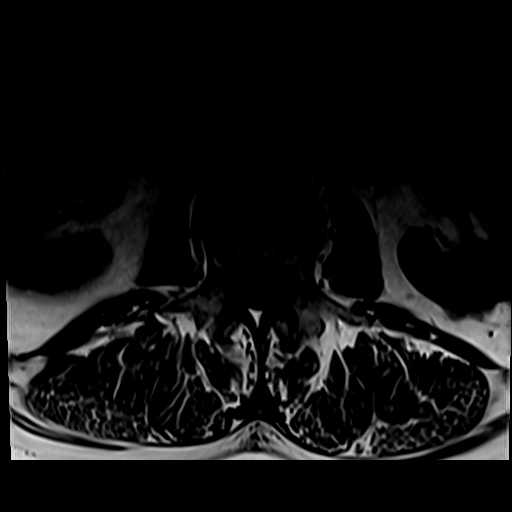
[im 32/37]
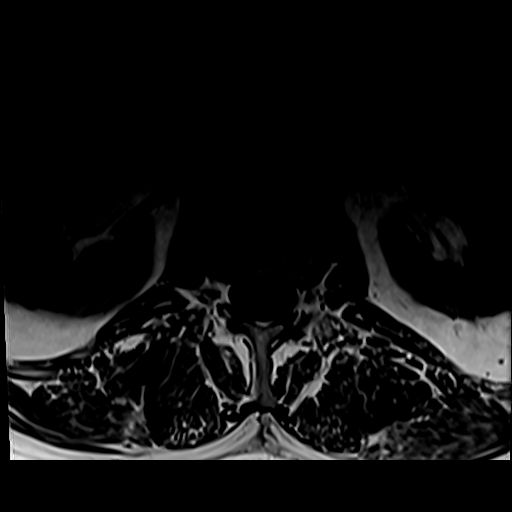
[im 37/37]
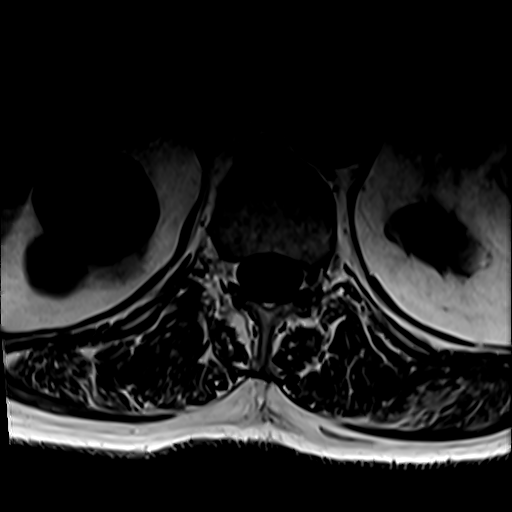

[Series 10: T1 fat-sat post-contrast · sagittal · 4.0mm · 0.90mm/px · 3 of 16 slices shown]
[im 1/16]
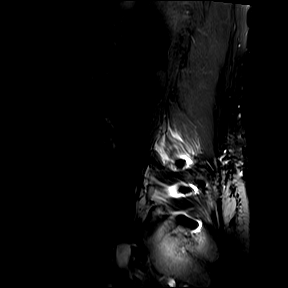
[im 6/16]
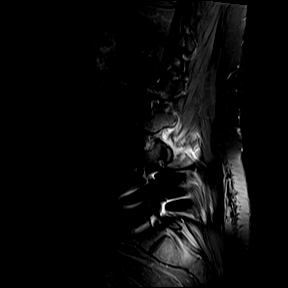
[im 11/16]
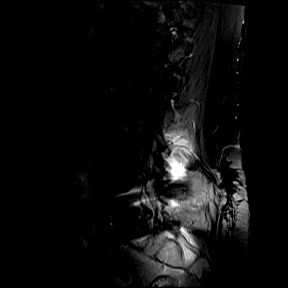

[30 of 48 positions shown; findings below may reference images not displayed]

FINDINGS: Segmentation: Lumbar segmentation appears to be normal and is the
same numbering system used on the 6160 MRI.

Alignment: Stable lumbar lordosis since 6160 with mild chronic
retrolisthesis of L5 on S1. superimposed dextroconvex lumbar
scoliosis.

Vertebrae: New postoperative hardware susceptibility artifact since
6160 at L5-S1. No convincing No marrow edema or evidence of acute
osseous abnormality. Normal background bone marrow signal. Intact
visible sacrum and SI joints.

Conus medullaris and cauda equina: Conus extends to the L1 level. No
lower spinal cord or conus signal abnormality. No abnormal
intradural enhancement no dural thickening.

Paraspinal and other soft tissues: Irregularity of the infrarenal
abdominal aorta, poorly evaluated on the axial images but with
evidence of abdominal aortic aneurysm measuring 31-32 mm on sagittal
images (series 5, image 8).

Otherwise chronic renal cysts appear stable and benign. Mild
postoperative changes to the lower lumbar paraspinal soft tissues.

Disc levels:

T12-L1:  Negative.

L1-L2:  Stable mild disc space loss and disc bulging. No stenosis.

L2-L3:  Stable, negative.

L3-L4: Chronic disc space loss and left eccentric circumferential
disc bulge with increased left foraminal disc osteophyte complex and
epidural lipomatosis since 6160. Increased mild spinal stenosis,
mild to moderate left lateral recess stenosis (left L4 nerve level).
Increased mild to moderate left L3 neural foraminal stenosis. Mild
superimposed facet and ligament flavum hypertrophy.

L4-L5: Chronic circumferential disc bulge appears increased since
6160, along with moderate bilateral facet and ligament flavum
hypertrophy. And there are new bilateral T2 hyperintense synovial
cyst projecting into the spinal canal (series 8, image 25). The
larger is on the right and up to 8 mm long axis. Epidural
lipomatosis is chronic but increased.

Subsequent increased and severe spinal and right lateral recess
stenosis (right L5 nerve level). Stable mild L4 foraminal stenosis.

L5-S1: Interval decompression and fusion with mild architectural
distortion on the right. Superimposed epidural lipomatosis. Improved
thecal sac patency, no spinal stenosis. Improved but not completely
resolved left lateral recess stenosis, mild residual (left S1 nerve
level). Bulky foraminal disc osteophyte complex greater on the
right, but right L5 foraminal patency appears improved although with
moderate residual right L5 foraminal stenosis. Mild left foraminal
stenosis appears stable.
IMPRESSION: 1. 3.2 cm Infrarenal Abdominal Aortic Aneurysm. Recommend follow-up
every 3 years. Reference: [HOSPITAL] 7701;[DATE].

2. Interval decompression and fusion at L5-S1 with resolved spinal
stenosis. Improved patency of the left lateral recess (mild residual
stenosis) and right L5 neural foramen (moderate residual stenosis).

3. Adjacent segment disease at L4-L5 with increased and severe
multifactorial spinal and right lateral recess stenosis, in part due
to new right > left synovial cysts.

4. Increased degeneration at L3-L4 with mild spinal and up to
moderate left lateral recess stenosis.

## 2022-03-24 ENCOUNTER — Ambulatory Visit: Payer: Medicare Other | Admitting: Hematology

## 2022-03-24 ENCOUNTER — Encounter: Payer: PPO | Admitting: Thoracic Surgery (Cardiothoracic Vascular Surgery)

## 2022-03-26 ENCOUNTER — Inpatient Hospital Stay: Payer: PPO | Attending: Physician Assistant | Admitting: Hematology

## 2022-03-26 VITALS — BP 133/93 | HR 95 | Temp 98.4°F | Resp 18 | Wt 198.0 lb

## 2022-03-26 DIAGNOSIS — Z8249 Family history of ischemic heart disease and other diseases of the circulatory system: Secondary | ICD-10-CM | POA: Insufficient documentation

## 2022-03-26 DIAGNOSIS — Z825 Family history of asthma and other chronic lower respiratory diseases: Secondary | ICD-10-CM | POA: Diagnosis not present

## 2022-03-26 DIAGNOSIS — Z9049 Acquired absence of other specified parts of digestive tract: Secondary | ICD-10-CM | POA: Diagnosis not present

## 2022-03-26 DIAGNOSIS — K769 Liver disease, unspecified: Secondary | ICD-10-CM | POA: Diagnosis not present

## 2022-03-26 DIAGNOSIS — Z8489 Family history of other specified conditions: Secondary | ICD-10-CM | POA: Insufficient documentation

## 2022-03-26 DIAGNOSIS — Z888 Allergy status to other drugs, medicaments and biological substances status: Secondary | ICD-10-CM | POA: Diagnosis not present

## 2022-03-26 DIAGNOSIS — N1831 Chronic kidney disease, stage 3a: Secondary | ICD-10-CM | POA: Insufficient documentation

## 2022-03-26 DIAGNOSIS — Z8261 Family history of arthritis: Secondary | ICD-10-CM | POA: Insufficient documentation

## 2022-03-26 DIAGNOSIS — Z85828 Personal history of other malignant neoplasm of skin: Secondary | ICD-10-CM | POA: Diagnosis not present

## 2022-03-26 DIAGNOSIS — I129 Hypertensive chronic kidney disease with stage 1 through stage 4 chronic kidney disease, or unspecified chronic kidney disease: Secondary | ICD-10-CM | POA: Insufficient documentation

## 2022-03-26 DIAGNOSIS — C3431 Malignant neoplasm of lower lobe, right bronchus or lung: Secondary | ICD-10-CM | POA: Insufficient documentation

## 2022-03-26 DIAGNOSIS — Z8673 Personal history of transient ischemic attack (TIA), and cerebral infarction without residual deficits: Secondary | ICD-10-CM | POA: Insufficient documentation

## 2022-03-26 DIAGNOSIS — Z79899 Other long term (current) drug therapy: Secondary | ICD-10-CM | POA: Insufficient documentation

## 2022-03-26 DIAGNOSIS — Z880 Allergy status to penicillin: Secondary | ICD-10-CM | POA: Insufficient documentation

## 2022-03-26 DIAGNOSIS — D469 Myelodysplastic syndrome, unspecified: Secondary | ICD-10-CM | POA: Diagnosis present

## 2022-03-26 DIAGNOSIS — G629 Polyneuropathy, unspecified: Secondary | ICD-10-CM | POA: Diagnosis not present

## 2022-03-26 DIAGNOSIS — Z87442 Personal history of urinary calculi: Secondary | ICD-10-CM | POA: Insufficient documentation

## 2022-03-26 NOTE — Progress Notes (Signed)
[place] [address] [place] [address]    Clinic Day:  03/26/2022  Referring physician: Baruch Gouty, FNP  Patient Care Team: Baruch Gouty, FNP as PCP - General (Family Medicine) Mallipeddi, Quenten Raven, MD as PCP - Cardiology (Cardiology) Derek Jack, MD as Medical Oncologist (Medical Oncology) Brien Mates, RN as Oncology Nurse Navigator (Medical Oncology)   ASSESSMENT & PLAN:   Assessment: 1.  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  Right infrahilar nodules or lymph node 1.6 cm SUV 9.25.  Small ill-defined lucent lesion within the posterior aspect of the right medial femoral condyle, FDG avid.  Lesion from metastasis or multiple myeloma less favored. - MRI brain (12/05/2021): No metastatic disease - CT super D chest (11/29/2021): Lobulated lesion in the right lower lobe measuring 2.3 x 1.8 cm.  Adjacent smoothly marginated rounded lesion in the right hilum, likely adjacent lymph node, difficult to measure. - Bronchoscopy/EBUS (11/29/2021): Normal right and left lung anatomy, no evidence of endobronchial lesion.  Enlarged right hilar adenopathy.  No subcarinal adenopathy. - Pathology (11/29/2021): Lymph node 11 R FNA-squamous cell carcinoma - [facility]ant[facility] tissue (11/30/2021): PD-L1 22 C3 TPS 80% - [facility] liquid biopsy (11/29/2021): BRCA2 E2720, T p53, PTEN, MSI-high not detected.  Negative for EGFR/ALK.   2.  Social/family history: - He lives at home with his wife.  He worked as a Ecologist prior to retirement.  He smoked 2 packs/day of cigarettes for 40 years and quit smoking cigarettes 10 years ago.  He uses high nicotine vapes. - No family history of malignancies.   3.  IgG lambda MGUS: - SPEP on 10/07/2021, M spike 0.3 g. - Free kappa light chains 23.8, ratio 1.15.  Beta-2 microglobulin 2.6.  LDH 126.   Creatinine 1.41 and calcium normal.  Hemoglobin normal. - Skeletal survey (10/25/2021): Cannot exclude new small lucent lesion in the lateral right femoral head measuring 14 mm. - PET scan (11/07/2021): Small ill-defined lucent lesion within the posterior aspect of the right medial femoral condyle which is FDG avid.  Indeterminate.  Unusual location for lung cancer and lesion of multiple myeloma is considered less favored.  Sclerotic lesion within the distal diaphysis of the right tibia with possible chondroid matrix.  This chronic benign finding was present in the MRI from May 2007.  Plan: 1.  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 Roxan Hockey due to osteomyelitis of the left toe. - Now he is completing oral antibiotic therapy. - Reviewed PET scan from 03/13/2022 which showed central hilar component on the right side approximately 3.1 x 2.1 cm with SUV 9.0.  The more posterior competent measures 2.1 x 1.5 cm with SUV 4.5.  There is mildly increased metabolic activity in the liver adjacent to the cholecystectomy bed.  No evidence of progression or metastatic disease. - Recommend MRI of the abdomen with and without contrast to evaluate for hypermetabolic lesion in the liver. - RTC after MRI. - He has follow-up with Dr. Roxan Hockey on 04/15/2022. - If Dr. Roxan Hockey prefers neoadjuvant systemic therapy, he has no contraindications to immunotherapy.  His PD-L1 TPS was 80% and EGFR/ALK status is negative. - Hold over because of his neuropathy, we will have to use carboplatin AUC 5  on day 1, gemcitabine 1000 mg/m on days 1 and 8 every 21 days with nivolumab for 3 cycles followed by repeat staging. - PFTs were reasonably within normal limits. - If he requires neoadjuvant systemic therapy, will need port placement.   Orders Placed This Encounter  Procedures   MR LIVER W WO CONTRAST    Standing Status:   Future     Standing Expiration Date:   03/27/2023    Order Specific Question:   If indicated for the ordered procedure, I authorize the administration of contrast media per Radiology protocol    Answer:   Yes    Order Specific Question:   What is the patient's sedation requirement?    Answer:   No Sedation    Order Specific Question:   Does the patient have a pacemaker or implanted devices?    Answer:   No    Order Specific Question:   Release to patient    Answer:   Immediate    Order Specific Question:   Preferred imaging location?    Answer:   [facility] (table limit - [phone])      Juan Yang as a scribe for Derek Jack, MD.,have documented all relevant documentation on the behalf of Derek Jack, MD,as directed by  Derek Jack, MD while in the presence of Derek Jack, MD.   I, Derek Jack MD, have reviewed the above documentation for accuracy and completeness, and I agree with the above.   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     INTERVAL HISTORY:   Juan Yang is a 67 y.o. male presenting to clinic today for right lung squamous cell carcinoma. Marland Kitchen He was last seen by me on 12/18/2021 for a consultation.  He was admitted into the ED on 02/12/2022 for Osteomyelitis of left foot.  Today, he states that he is doing well overall. His appetite level is at 75%. His energy level is at 0%. All of his infections have cleared.He denies any coughs, blood in stool, and black stool. He occasionally feels tender in chest/ribs.  He is still taking  antibiotics doxycycline and cephalosporin for osteomyelitis of the left toe which he will finish in the next couple of days.  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   History of kidney stones    Hypertension    Peripheral neuropathy 10/29/2016   Pneumonia    Transient cerebral ischemic attack, unspecified 05/10/2020   stroke    Surgical History: Past Surgical  History:  Procedure Laterality Date   BRONCHIAL NEEDLE ASPIRATION BIOPSY  11/29/2021   Procedure: BRONCHIAL NEEDLE ASPIRATION BIOPSIES;  Surgeon: Garner Nash, DO;  Location: [facility];  Service: Pulmonary;;   CHOLECYSTECTOMY N/A 12/13/2021   Procedure: LAPAROSCOPIC CHOLECYSTECTOMY;  Surgeon: Virl Cagey, MD;  Location: [facility];  Service: General;  Laterality: N/A;   COLONOSCOPY WITH PROPOFOL N/A 11/14/2016   Procedure: COLONOSCOPY WITH PROPOFOL;  Surgeon: Rogene Houston, MD;  [facility];  Service: Endoscopy;  Laterality: N/A;  9:15   IR IMAGING GUIDED PORT INSERTION  12/24/2021   LUMBAR LAMINECTOMY/DECOMPRESSION MICRODISCECTOMY Right 05/24/2019   Procedure: LAMINECTOMY AND FORAMINOTOMY LUMBAR FIVE- SACRAL ONE RIGHT;  Surgeon: Vallarie Mare, MD;  Location: [facility];  Service: Neurosurgery;  Laterality: Right;  posterior/right   LUNG BIOPSY     MOHS SURGERY Right 05/2020   ear   MOHS SURGERY     POLYPECTOMY  11/14/2016   Procedure: POLYPECTOMY- SIGMOID COLON X3 TRANSVERSE COLON X2;  Surgeon: Rogene Houston, MD;  [facility];   Service: Endoscopy;;   rt ankle surgery     Titanium hinge   TONSILLECTOMY     TRANSFORAMINAL LUMBAR INTERBODY FUSION (TLIF) WITH PEDICLE SCREW FIXATION 1 LEVEL N/A 05/02/2020   Procedure: Lumbar five Sacral one Transforaminal lumbar interbody fusion;  Surgeon: Vallarie Mare, MD;  Location: [facility];  Service: Neurosurgery;  Laterality: N/A;   VIDEO BRONCHOSCOPY WITH ENDOBRONCHIAL ULTRASOUND N/A 11/29/2021   Procedure: VIDEO BRONCHOSCOPY WITH ENDOBRONCHIAL ULTRASOUND;  Surgeon: Garner Nash, DO;  Location: [facility];  Service: Pulmonary;  Laterality: N/A;    Social History: Social History   Socioeconomic History   Marital status: Married    Spouse name: Not on file   Number of children: Not on file   Years of education: Not on file   Highest education level: Not on file  Occupational History   Not on file  Tobacco Use   Smoking status: Former    Packs/day: 1.50    Years: 20.00    Total pack years: 30.00    Types: Cigarettes    Quit date: 11/12/2012    Years since quitting: 9.3    Passive exposure: Never   Smokeless tobacco: Never   Tobacco comments:    Pt smokes 1 pack a month and vapes CBD oil. ARJ 11/22/21  Vaping Use   Vaping Use: Former   Quit date: 12/18/2021   Substances: CBD  Substance and Sexual Activity   Alcohol use: No   Drug use: No   Sexual activity: Yes    Birth control/protection: None  Other Topics Concern   Not on file  Social History Narrative   Right handed   Lives with wife in one story home   Social Determinants of Health   Financial Resource Strain: Not on file  Food Insecurity: No Food Insecurity (12/19/2021)   Hunger Vital Sign    Worried About Running Out of Food in the Last Year: Never true    Ran Out of Food in the Last Year: Never true  Transportation Needs: No Transportation Needs (12/19/2021)   PRAPARE - Hydrologist (Medical): No    Lack of Transportation (Non-Medical): No  Physical Activity:  Inactive (05/30/2020)   Exercise Vital Sign    Days of Exercise per Week: 0 days    Minutes of Exercise per Session: 0 min  Stress: Not on file  Social Connections: Not on file  Intimate Partner Violence: Not At Risk (12/18/2021)   Humiliation, Afraid, Rape, and Kick questionnaire    Fear of Current or Ex-Partner: No    Emotionally Abused: No    Physically Abused: No    Sexually Abused: No    Family History: Family History  Problem Relation Age of Onset   Charcot-Marie-Tooth disease Mother    Heart disease Father    COPD Father    Arthritis Sister     Current Medications:  Current Outpatient Medications:    amLODipine (NORVASC) 2.5 MG tablet, Take 2.5 mg by mouth 2 (two) times daily., Disp: , Rfl:    aspirin EC (ADULT ASPIRIN REGIMEN) 81 MG tablet, Take 81-325 mg by mouth daily., Disp: , Rfl:    cefadroxil (DURICEF) 1 g tablet, Take 1 tablet (1 g total) by mouth 2 (two) times daily., Disp: 70 tablet, Rfl: 0   cyclobenzaprine (FLEXERIL) 10 MG tablet, Take 1 tablet (10 mg total) by mouth 3 (three) times daily as needed for muscle spasms., Disp: 30 tablet, Rfl: 0   doxycycline (VIBRA-TABS) 100 MG tablet, Take 1 tablet (100 mg total) by mouth 2 (two) times daily., Disp: 70 tablet, Rfl: 0   furosemide (LASIX) 40 MG tablet, Take 1 tablet (40 mg total) by mouth daily., Disp: 30 tablet, Rfl: 3   Glucosamine HCl-MSM (GLUCOSAMINE-MSM PO), Take 1 tablet by mouth daily., Disp: , Rfl:    losartan (COZAAR) 25 MG tablet, Take 25 mg by mouth daily., Disp: , Rfl:    metoprolol succinate (TOPROL-XL) 25 MG 24 hr tablet, Take 1 tablet (25 mg total) by mouth daily., Disp: 90 tablet, Rfl: 3   naproxen sodium (ALEVE) 220 MG tablet, Take 220 mg by mouth daily as needed (pain)., Disp: , Rfl:    oxyCODONE (OXY IR/ROXICODONE) 5 MG immediate release tablet, Take 1-2 tablets (5-10 mg total) by mouth every 4 (four) hours as needed for breakthrough pain or severe pain., Disp: 20 tablet, Rfl: 0   Potassium 99 MG  TABS, Take 99 mg by mouth daily as needed (low potassium)., Disp: , Rfl:    sildenafil (VIAGRA) 100 MG tablet, Take 100 mg by mouth daily as needed for erectile dysfunction., Disp: , Rfl:    sodium bicarbonate 650 MG tablet, Take 650 mg by mouth 2 (two) times daily., Disp: , Rfl:    Allergies: Allergies  Allergen Reactions   Other Hives    DUCK EGGS ONLY   Atenolol Diarrhea and Nausea Only    Pt unsure of allergy? Possible fainting?   Cymbalta [Duloxetine Hcl] Other (See Comments)    Pt states it shut his bladder down and could not urinate; states it made him lethargic as well.   Latex Itching   Penicillins Hives    Did it involve swelling of the face/tongue/throat, SOB, or low BP? Unknown Did it involve sudden or severe rash/hives, skin peeling, or any reaction on the inside of your mouth or nose? Unknown Did you need to seek medical attention at a hospital or doctor's office? yes When did it last happen?      Childhood If all above answers are "NO", may proceed with cephalosporin use.    REVIEW OF SYSTEMS:   Review of Systems  Constitutional:  Negative for chills, fatigue and fever.  HENT:   Negative for lump/mass, mouth sores, nosebleeds, sore throat and trouble swallowing.   Eyes:  Negative for eye problems.  Respiratory:  Positive for shortness of breath. Negative for cough.   Cardiovascular:  Negative for chest pain, leg swelling and palpitations.  Gastrointestinal:  Positive for nausea. Negative for abdominal pain, constipation, diarrhea and vomiting.  Genitourinary:  Negative for bladder incontinence, difficulty urinating, dysuria, frequency, hematuria and nocturia.   Musculoskeletal:  Positive for back pain (Stage 3 Ccner). Negative for arthralgias, flank pain, myalgias and neck pain.  Skin:  Negative for itching and rash.  Neurological:  Positive for headaches and numbness. Negative for dizziness. Seizures: Tingling/. Hematological:  Does not bruise/bleed easily.   Psychiatric/Behavioral:  Positive for sleep disturbance (Fall/staying). Negative for depression and suicidal ideas. The patient is not nervous/anxious.   All other systems reviewed and are negative.    VITALS:   Blood pressure (!) 133/93, pulse 95, temperature 98.4 F (36.9 C), temperature source Oral, resp. rate 18, weight 198 lb (89.8 kg), SpO2 97 %.  Wt Readings from Last 3 Encounters:  03/26/22 198 lb (89.8 kg)  02/13/22 201 lb 4.5 oz (91.3 kg)  02/12/22 194 lb (88 kg)    Body mass index is 26.85 kg/m.  Performance status (ECOG): 1 - Symptomatic but completely ambulatory  PHYSICAL EXAM:   Physical Exam Vitals and nursing note reviewed. Exam conducted with a chaperone present.  Constitutional:      Appearance: Normal appearance.  Cardiovascular:     Rate and Rhythm: Normal rate and regular rhythm.     Pulses: Normal pulses.     Heart sounds: Normal heart sounds.  Pulmonary:     Effort: Pulmonary effort is normal.     Breath sounds: Normal breath sounds.  Abdominal:     Palpations: Abdomen is soft. There is no hepatomegaly, splenomegaly or mass.     Tenderness: There is no abdominal tenderness.  Musculoskeletal:     Right lower leg: No edema.     Left lower leg: No edema.  Lymphadenopathy:     Cervical: No cervical adenopathy.     Right cervical: No superficial, deep or posterior cervical adenopathy.    Left cervical: No superficial, deep or posterior cervical adenopathy.     Upper Body:     Right upper body: No supraclavicular or axillary adenopathy.     Left upper body: No supraclavicular or axillary adenopathy.  Neurological:     General: No focal deficit present.     Mental Status: He is alert and oriented to person, place, and time.  Psychiatric:        Mood and Affect: Mood normal.        Behavior: Behavior normal.     LABS:      Latest Ref Rng & Units 02/19/2022    5:46 AM 02/14/2022    5:41 AM 02/13/2022    7:43 AM  CBC  WBC 4.0 - 10.5 K/uL 11.2   11.7  11.5   Hemoglobin 13.0 - 17.0 g/dL 14.5  14.0  13.7   Hematocrit 39.0 - 52.0 % 43.5  42.9  40.2   Platelets 150 - 400 K/uL 225  242  249       Latest Ref Rng & Units 02/18/2022    5:36 AM 02/17/2022    6:24 AM 02/16/2022    5:48 AM  CMP  Glucose 70 - 99 mg/dL 103  158  114   BUN 8 - 23 mg/dL 14  16  17    Creatinine 0.61 - 1.24 mg/dL 1.10  1.20  1.35   Sodium 135 - 145 mmol/L 135  135  136   Potassium 3.5 - 5.1 mmol/L 3.9  3.3  3.5   Chloride 98 - 111 mmol/L 105  101  104   CO2 22 - 32 mmol/L 22  24  24    Calcium 8.9 - 10.3 mg/dL 8.8  8.8  8.5      No results found for: "CEA1", "CEA" / No results found for: "CEA1", "CEA" No results found for: "PSA1" No results found for: "JXB147" No results found for: "CAN125"  Lab Results  Component Value Date   TOTALPROTELP 6.9 10/07/2021   TOTALPROTELP 6.9 10/07/2021   ALBUMINELP 3.6 10/07/2021   A1GS 0.2 10/07/2021   A2GS 0.8 10/07/2021   BETS 1.2 10/07/2021   BETA2SER 0.4 04/11/2020   GAMS 1.1 10/07/2021   MSPIKE 0.3 (H) 10/07/2021   SPEI Comment 10/07/2021   No results found for: "TIBC", "FERRITIN", "IRONPCTSAT" Lab Results  Component Value Date   LDH 126 10/07/2021   LDH 162 04/11/2021   LDH 142 05/30/2020     STUDIES:   NM PET Image Restag (PS) Skull Base To Thigh  Result Date: 03/14/2022 CLINICAL DATA:  Subsequent treatment strategy for non-small cell lung cancer. History of multiple myeloma. EXAM: NUCLEAR MEDICINE PET SKULL BASE TO THIGH TECHNIQUE: 10.3 mCi F-18 FDG was injected intravenously. Full-ring PET imaging was performed from the skull base to thigh after the radiotracer. CT data was obtained and used for attenuation correction and anatomic localization. Fasting blood glucose: 105 mg/dl COMPARISON:  Chest CT 11/29/2021.  PET-CT 11/07/2021. FINDINGS: Mediastinal blood pool activity: SUV max 1.6 NECK: No hypermetabolic cervical lymph nodes are identified.Fairly symmetric activity within the lymphoid tissue of  Waldeyer's ring is within physiologic limits.No suspicious activity identified within the pharyngeal mucosal space. Activity within the muscles of mastication/phonation, within physiologic limits. Incidental CT findings: none CHEST: There are no hypermetabolic mediastinal, hilar or axillary lymph nodes. Persistent intense hypermetabolic activity within the previously demonstrated right hilar/perihilar nodules. The central hilar component measures approximately 3.1 x 2.1 cm on image 59/9 and has an SUV max of 9.0 (previously 12.1). The more posterior component measures 2.1 x 1.5 cm on image 61/9 and has an SUV max of 4.5 (previously 9.3). No new or enlarging nodules identified. Incidental CT findings: Left IJ Port-A-Cath extends to the lower SVC level. Coronary artery atherosclerosis noted. ABDOMEN/PELVIS: There is a new small ill-defined focus of mildly increased metabolic activity centrally in the liver adjacent to the cholecystectomy bed. This has an SUV max of 3.7 and is without clear correlate on the CT images. No other hypermetabolic activity identified in the liver, spleen, pancreas or adrenal glands. There is no hypermetabolic nodal activity in the abdomen or pelvis. Incidental CT findings: Stable benign renal cysts for which no follow-up imaging is recommended. Prior cholecystectomy. Aortic and branch vessel atherosclerosis with similar 3.1 cm abdominal radiopaque and resume on image 196/3. SKELETON: There is no hypermetabolic activity to suggest osseous metastatic disease. Incidental CT findings: Postsurgical changes in the lower lumbar spine. IMPRESSION: 1. Interval improved but persistent intense hypermetabolic activity within the right hilar/perihilar nodules consistent with partial response to therapy. 2. There is a new indeterminate small ill-defined focus of mildly increased metabolic activity centrally in the liver adjacent to the cholecystectomy bed which has no clear CT correlate. Consider further  evaluation with abdominal MRI without and with contrast or attention on follow-up. 3. No definite evidence of progressive or distant metastatic disease 4. 3.1 cm abdominal aortic aneurysm. Recommend follow-up every 3 years. Reference: J Am Coll Radiol 8295;62:130-865. 5.  Aortic Atherosclerosis (ICD10-I70.0). Electronically  Signed   By: Richardean Sale M.D.   On: 03/14/2022 09:01

## 2022-03-26 NOTE — Patient Instructions (Addendum)
[place] at [facility] Discharge Instructions   You were seen and examined today by Dr. Delton Coombes.  He reviewed the results of your PET scan. The spots in the lung are stable. However, there is a new spot on the liver. We will do an MRI of the liver to make sure this spot is not related to the cancer in the lung.   Return as scheduled.    Thank you for choosing [place] at [facility] to provide your oncology and hematology care.  To afford each patient quality time with our provider, please arrive at least 15 minutes before your scheduled appointment time.   If you have a lab appointment with the [facility] please come in thru the Main Entrance and check in at the main information desk.  You need to re-schedule your appointment should you arrive 10 or more minutes late.  We strive to give you quality time with our providers, and arriving late affects you and other patients whose appointments are after yours.  Also, if you no show three or more times for appointments you may be dismissed from the clinic at the providers discretion.     Again, thank you for choosing [facility].  Our hope is that these requests will decrease the amount of time that you wait before being seen by our physicians.       _____________________________________________________________  Should you have questions after your visit to [facility], please contact our office at [phone] and follow the prompts.  Our office hours are 8:00 a.m. and 4:30 p.m. Monday - Friday.  Please note that voicemails left after 4:00 p.m. may not be returned until the following business day.  We are closed weekends and major holidays.  You do have access to a nurse 24-7, just call the main number to the clinic [phone] and do not press any options, hold on the line and a nurse will answer the phone.    For prescription refill requests, have your pharmacy  contact our office and allow 72 hours.    Due to Covid, you will need to wear a mask upon entering the hospital. If you do not have a mask, a mask will be given to you at the Main Entrance upon arrival. For doctor visits, patients may have 1 support person age 35 or older with them. For treatment visits, patients can not have anyone with them due to social distancing guidelines and our immunocompromised population.

## 2022-04-03 ENCOUNTER — Ambulatory Visit (HOSPITAL_BASED_OUTPATIENT_CLINIC_OR_DEPARTMENT_OTHER): Payer: PPO

## 2022-04-08 ENCOUNTER — Ambulatory Visit: Payer: PPO | Admitting: Hematology

## 2022-04-10 ENCOUNTER — Encounter: Payer: Medicare Other | Admitting: Family Medicine

## 2022-04-11 ENCOUNTER — Ambulatory Visit (HOSPITAL_BASED_OUTPATIENT_CLINIC_OR_DEPARTMENT_OTHER): Payer: PPO

## 2022-04-15 ENCOUNTER — Institutional Professional Consult (permissible substitution): Payer: PPO | Admitting: Thoracic Surgery (Cardiothoracic Vascular Surgery)

## 2022-04-15 ENCOUNTER — Encounter: Payer: Self-pay | Admitting: Thoracic Surgery (Cardiothoracic Vascular Surgery)

## 2022-04-15 VITALS — BP 127/92 | HR 104 | Resp 20 | Ht 72.0 in | Wt 197.0 lb

## 2022-04-15 DIAGNOSIS — C3490 Malignant neoplasm of unspecified part of unspecified bronchus or lung: Secondary | ICD-10-CM
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
## 2022-04-16 ENCOUNTER — Ambulatory Visit: Payer: PPO | Admitting: Hematology

## 2022-04-16 ENCOUNTER — Other Ambulatory Visit: Payer: Medicare Other

## 2022-04-17 ENCOUNTER — Inpatient Hospital Stay: Payer: PPO | Attending: Physician Assistant | Admitting: Hematology

## 2022-04-17 ENCOUNTER — Telehealth: Payer: Self-pay | Admitting: Internal Medicine

## 2022-04-17 VITALS — BP 134/90 | HR 108 | Temp 98.5°F | Resp 18 | Ht 73.0 in | Wt 199.7 lb

## 2022-04-17 DIAGNOSIS — Z5111 Encounter for antineoplastic chemotherapy: Secondary | ICD-10-CM | POA: Insufficient documentation

## 2022-04-17 DIAGNOSIS — Z87442 Personal history of urinary calculi: Secondary | ICD-10-CM | POA: Diagnosis not present

## 2022-04-17 DIAGNOSIS — Z87891 Personal history of nicotine dependence: Secondary | ICD-10-CM | POA: Diagnosis not present

## 2022-04-17 DIAGNOSIS — G629 Polyneuropathy, unspecified: Secondary | ICD-10-CM | POA: Diagnosis not present

## 2022-04-17 DIAGNOSIS — Z85828 Personal history of other malignant neoplasm of skin: Secondary | ICD-10-CM | POA: Insufficient documentation

## 2022-04-17 DIAGNOSIS — Z8489 Family history of other specified conditions: Secondary | ICD-10-CM | POA: Insufficient documentation

## 2022-04-17 DIAGNOSIS — R0609 Other forms of dyspnea: Secondary | ICD-10-CM

## 2022-04-17 DIAGNOSIS — Z825 Family history of asthma and other chronic lower respiratory diseases: Secondary | ICD-10-CM | POA: Insufficient documentation

## 2022-04-17 DIAGNOSIS — I129 Hypertensive chronic kidney disease with stage 1 through stage 4 chronic kidney disease, or unspecified chronic kidney disease: Secondary | ICD-10-CM | POA: Diagnosis not present

## 2022-04-17 DIAGNOSIS — Z95828 Presence of other vascular implants and grafts: Secondary | ICD-10-CM | POA: Diagnosis not present

## 2022-04-17 DIAGNOSIS — Z888 Allergy status to other drugs, medicaments and biological substances status: Secondary | ICD-10-CM | POA: Diagnosis not present

## 2022-04-17 DIAGNOSIS — Z8261 Family history of arthritis: Secondary | ICD-10-CM | POA: Diagnosis not present

## 2022-04-17 DIAGNOSIS — Z8673 Personal history of transient ischemic attack (TIA), and cerebral infarction without residual deficits: Secondary | ICD-10-CM | POA: Diagnosis not present

## 2022-04-17 DIAGNOSIS — C3431 Malignant neoplasm of lower lobe, right bronchus or lung: Secondary | ICD-10-CM | POA: Diagnosis present

## 2022-04-17 DIAGNOSIS — D469 Myelodysplastic syndrome, unspecified: Secondary | ICD-10-CM | POA: Diagnosis not present

## 2022-04-17 DIAGNOSIS — N1831 Chronic kidney disease, stage 3a: Secondary | ICD-10-CM | POA: Insufficient documentation

## 2022-04-17 DIAGNOSIS — Z8249 Family history of ischemic heart disease and other diseases of the circulatory system: Secondary | ICD-10-CM | POA: Insufficient documentation

## 2022-04-17 DIAGNOSIS — C3491 Malignant neoplasm of unspecified part of right bronchus or lung: Secondary | ICD-10-CM | POA: Diagnosis not present

## 2022-04-17 DIAGNOSIS — Z5112 Encounter for antineoplastic immunotherapy: Secondary | ICD-10-CM | POA: Insufficient documentation

## 2022-04-17 DIAGNOSIS — Z79899 Other long term (current) drug therapy: Secondary | ICD-10-CM | POA: Diagnosis not present

## 2022-04-17 DIAGNOSIS — Z9049 Acquired absence of other specified parts of digestive tract: Secondary | ICD-10-CM | POA: Diagnosis not present

## 2022-04-17 NOTE — Patient Instructions (Addendum)
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 You will be treated in the clinic with two chemotherapy drugs and one immunotherapy drugs. The chemotherapy drugs are carboplatin and gemcitabine (Gemzar). The immunotherapy drug is called nivolumab (Opdivo). You will come to the clinic two weeks in a row for treatment with one week off. This is considered one cycle of treatment. We will do three cycles of treatment and then repeat scans to see how much the cancer has shrunk. The intent of treatment is to prepare you for surgery/cure. You will see the doctor regularly throughout treatment.  We will obtain blood work from you prior to every treatment and monitor your results to make sure it is safe to give your treatment. The doctor monitors your response to treatment by the way you are feeling, your blood work, and by obtaining scans periodically. There will be wait times while you are here for treatment. It will take about 30 minutes to 1 hour for your lab work to result.  Then there will be wait times while pharmacy mixes your medications.    Medications you will receive in the clinic prior to your chemotherapy medications:  Aloxi:  ALOXI is used in adults to help prevent nausea and vomiting that happens with certain chemotherapy drugs.  Aloxi is a long acting medication, and will remain in your system for about two days.   Dexamethasone:  This is a steroid given prior to chemotherapy to help prevent allergic reactions; it may also help prevent and control nausea and diarrhea.     Carboplatin (Paraplatin, CBDCA)  About This Drug  Carboplatin is used to treat cancer. It is given in the vein (IV).  It will take 30 minutes to infuse.   Possible Side Effects   Bone marrow suppression. This is a decrease in the number of white blood cells, red blood cells, and platelets. This may raise your risk of  infection, make you tired and weak (fatigue), and raise your risk of bleeding.   Nausea and vomiting (throwing up)   Weakness   Changes in your liver function   Changes in your kidney function   Electrolyte changes   Pain  Note: Each of the side effects above was reported in 20% or greater of patients treated with carboplatin. Not all possible side effects are included above.   Warnings and Precautions   Severe bone marrow suppression   Allergic reactions, including anaphylaxis are rare but may happen in some patients. Signs of allergic reaction to this drug may be swelling of the face, feeling like your tongue or throat are swelling, trouble breathing, rash, itching, fever, chills, feeling dizzy, and/or feeling that your heart is beating in a fast or not normal way. If this happens, do not take another dose of this drug. You should get urgent medical treatment.   Severe nausea and vomiting   Effects on the nerves are called peripheral neuropathy. This risk is increased if you are over the age of 58 or if you have received other medicine with risk of peripheral neuropathy. You may feel numbness, tingling, or pain in your hands and feet. It may be hard for you to button your clothes, open jars, or walk as usual. The effect on the nerves may get worse with more doses of the drug. These effects get better in some people after the drug is stopped but it does  not get better in all people.   Blurred vision, loss of vision or other changes in eyesight   Decreased hearing   - Skin and tissue irritation including redness, pain, warmth, or swelling at the IV site if the drug leaks out of the vein and into nearby tissue.   Severe changes in your kidney function, which can cause kidney failure   Severe changes in your liver function, which can cause liver failure  Note: Some of the side effects above are very rare. If you have concerns and/or questions, please discuss them with your medical  team.   Important Information   This drug may be present in the saliva, tears, sweat, urine, stool, vomit, semen, and vaginal secretions. Talk to your doctor and/or your nurse about the necessary precautions to take during this time.   Treating Side Effects   Manage tiredness by pacing your activities for the day.   Be sure to include periods of rest between energy-draining activities.   To decrease the risk of infection, wash your hands regularly.   Avoid close contact with people who have a cold, the flu, or other infections.   Take your temperature as your doctor or nurse tells you, and whenever you feel like you may have a fever.   To help decrease the risk of bleeding, use a soft toothbrush. Check with your nurse before using dental floss.   Be very careful when using knives or tools.   Use an electric shaver instead of a razor.   Drink plenty of fluids (a minimum of eight glasses per day is recommended).   If you throw up or have loose bowel movements, you should drink more fluids so that you do not become dehydrated (lack of water in the body from losing too much fluid).   To help with nausea and vomiting, eat small, frequent meals instead of three large meals a day. Choose foods and drinks that are at room temperature. Ask your nurse or doctor about other helpful tips and medicine that is available to help stop or lessen these symptoms.   If you have numbness and tingling in your hands and feet, be careful when cooking, walking, and handling sharp objects and hot liquids.   Keeping your pain under control is important to your well-being. Please tell your doctor or nurse if you are experiencing pain.   Food and Drug Interactions   There are no known interactions of carboplatin with food.   This drug may interact with other medicines. Tell your doctor and pharmacist about all the prescription and over-the-counter medicines and dietary supplements (vitamins, minerals,  herbs and others) that you are taking at this time. Also, check with your doctor or pharmacist before starting any new prescription or over-the-counter medicines, or dietary supplements to make sure that there are no interactions.   When to Call the Doctor  Call your doctor or nurse if you have any of these symptoms and/or any new or unusual symptoms:   Fever of 100.4 F (38 C) or higher   Chills   Tiredness that interferes with your daily activities   Feeling dizzy or lightheaded   Easy bleeding or bruising   Nausea that stops you from eating or drinking and/or is not relieved by prescribed medicines   Throwing up   Blurred vision or other changes in eyesight   Decrease in hearing or ringing in the ear   Signs of allergic reaction: swelling of the face, feeling like your tongue  or throat are swelling, trouble breathing, rash, itching, fever, chills, feeling dizzy, and/or feeling that your heart is beating in a fast or not normal way. If this happens, call 911 for emergency care.   Signs of possible liver problems: dark urine, pale bowel movements, bad stomach pain, feeling very tired and weak, unusual itching, or yellowing of the eyes or skin   Decreased urine, or very dark urine   Numbness, tingling, or pain in your hands and feet   Pain that does not go away or is not relieved by prescribed medicine   While you are getting this drug, please tell your nurse right away if you have any pain, redness, or swelling at the site of the IV infusion, or if you have any new onset of symptoms, or if you just feel "different" from before when the infusion was started.   Reproduction Warnings   Pregnancy warning: This drug may have harmful effects on the unborn baby. Women of child bearing potential should use effective methods of birth control during your cancer treatment. Let your doctor know right away if you think you may be pregnant.   Breastfeeding warning: It is not known if  this drug passes into breast milk. For this reason, women should not breastfeed during treatment because this drug could enter the breast milk and cause harm to a breastfeeding baby.   Fertility warning: Human fertility studies have not been done with this drug. Talk with your doctor or nurse if you plan to have children. Ask for information on sperm or egg banking.   Gemcitabine (Gemzar)  About This Drug Gemcitabine is used to treat cancer. It is given in the vein (IV).  It will take 30 minutes to infuse.  Possible Side Effects  Bone marrow suppression. This is a decrease in the number of white blood cells, red blood cells, and platelets. This may raise your risk of infection, make you tired and weak (fatigue), and raise your risk of bleeding.   Fever   Trouble breathing   Nausea and throwing up (vomiting)   Changes in your liver function   Increased protein in your urine, which can affect how your kidneys work   Blood in your urine   Rash   Swelling of your legs, ankles and/or feet  Note: Each of the side effects above was reported in 20% or greater of patients treated with Gemcitabine. Not all possible side effects are included above.  Warnings and Precautions  Severe bone marrow suppression   Inflammation (swelling) of the lungs and/or thickening of the lung tissues, which may be lifethreatening. You may have a dry cough or trouble breathing.   Changes in your kidney function, which can cause kidney failure   Changes in your liver function, which can cause liver failure and may be life-threatening   If you have received radiation treatments, your skin may become red and/or you may develop soreness of the mouth and throat after gemcitabine. This reaction is called "recall." Your body is recalling, or remembering, that it had radiation therapy.   A syndrome where fluid from your veins can leak into your tissues and cause a decrease in your blood pressure and fluid to  accumulate in your tissues and/or lungs.   A syndrome can occur that causes changes to kidney and liver function in combination with a decrease in red blood cells. Kidney failure may result which may be life-threatening.   Changes in your central nervous system can happen. The central  nervous system is made up of your brain and spinal cord. You could feel extreme tiredness, agitation, confusion, hallucinations (see or hear things that are not there), have trouble understanding or speaking, loss of control of your bowels or bladder, eyesight changes, numbness or lack of strength to your arms, legs, face, or body, seizures or coma. If you start to have any of these symptoms let your doctor know right away.  Note: Some of the side effects above are very rare. If you have concerns and/or questions, please discuss them with your medical team.  Important Information  This drug may be present in the saliva, tears, sweat, urine, stool, vomit, semen, and vaginal secretions. Talk to your doctor and/or your nurse about the necessary precautions to take during this time.  Treating Side Effects  Manage tiredness by pacing your activities for the day.   Be sure to include periods of rest between energy-draining activities.   To decrease the risk of infection, wash your hands regularly.   Avoid close contact with people who have a cold, the flu, or other infections.   Take your temperature as your doctor or nurse tells you, and whenever you feel like you may have a fever.   To help decrease bleeding, use a soft toothbrush. Check with your nurse before using dental floss.   Be very careful when using knives or tools.   Use an electric shaver instead of a razor.   Drink plenty of fluids (a minimum of eight glasses per day is recommended).   If you throw up or have loose bowel movements, you should drink more fluids so that you do not become dehydrated (lack of water in the body from losing too much  fluid).   To help with nausea and vomiting, eat small, frequent meals instead of three large meals a day. Choose foods and drinks that are at room temperature. Ask your nurse or doctor about other helpful tips and medicine that is available to help stop or lessen these symptoms.   If you get a rash do not put anything on it unless your doctor or nurse says you may. Keep the area around the rash clean and dry. Ask your doctor for medicine if your rash bothers you.   If you received radiation, and your skin becomes red or irritated again, or you develop soreness of the mouth and throat, follow the same care instructions you did during radiation treatment. Be sure to tell the nurse or doctor administering your chemotherapy about your skin changes.  Food and Drug Interactions  There are no known interactions of gemcitabine with food.   This drug may interact with other medicines. Tell your doctor and pharmacist about all the prescription and over-the-counter medicines and dietary supplements (vitamins, minerals, herbs and others) that you are taking at this time. Also, check with your doctor or pharmacist before starting any new prescription or over-the-counter medicines, or dietary supplements to make sure that there are no interactions.  When to Call the Doctor Call your doctor or nurse if you have any of these symptoms and/or any new or unusual symptoms:   Fever of 100.4 F (38 C) or higher   Chills   Tiredness that interferes with your daily activities   Feeling dizzy or lightheaded   Pain in your chest   Dry cough   Wheezing and/or trouble breathing   Confusion and/or agitation   Symptoms of a seizure such as confusion, blacking out, passing out, loss of hearing  or vision, blurred vision, unusual smells or tastes (such as burning rubber), trouble talking, tremors or shaking in parts or all of the body, repeated body movements, tense muscles that do not relax, and loss of control of  urine and bowels. If you or your family member suspects you are having a seizure, call 911 right away.   Hallucinations   Trouble understanding or speaking   Blurry vision or changes in your eyesight   Numbness or lack of strength to your arms, legs, face, or body   Easy bleeding or bruising   Nausea that stops you from eating or drinking and/or is not relieved by prescribed medicines   Throwing up    Swelling of legs, ankles, or feet   Weight gain of 5 pounds in one week (fluid retention)   Blood in urine   Decreased urine or very dark urine   Foamy or bubbly-looking urine   A new rash/itching or a rash that is not relieved by prescribed medicines   Signs of possible liver problems: dark urine, pale bowel movements, bad stomach pain, feeling very tired and weak, unusual itching, or yellowing of the eyes or skin   If you think you may be pregnant or may have impregnated your partner  Reproduction Warnings   Pregnancy warning: This drug can have harmful effects on the unborn baby. Women of childbearing potential should use effective methods of birth control during your cancer treatment and for 6 months after treatment. Men with male partners of childbearing potential should use effective methods of birth control during your cancer treatment and for 3 months after your cancer treatment. Let your doctor know right away if you think you may be pregnant or may have impregnated your partner.   Breastfeeding warning: Women should not breastfeed during treatment and for 1 week after treatment because this drug could enter the breast milk and cause harm to a breastfeeding baby.   Fertility warning: In men, this drug may affect your ability to have children in the future. Talk with your doctor or nurse if you plan to have children. Ask for information on sperm banking.  SELF CARE ACTIVITIES WHILE ON CHEMOTHERAPY/IMMUNOTHERAPY:  Hydration Increase your fluid intake and drink at  least 64 ounces (1 liter) of water/decaffeinated beverages per day after treatment. You can still have your cup of coffee or soda but these beverages do not count as part of the 64 ounces that you need to drink daily. Limit alcohol intake.  Medications Continue taking your normal prescription medication as prescribed.  If you start any new herbal or new supplements please let us know first to make sure it is safe.  Mouth Care Have teeth cleaned professionally before starting treatment. Keep dentures and partial plates clean. Use soft toothbrush and do not use mouthwashes that contain alcohol. Biotene is a good mouthwash that is available at most pharmacies or may be ordered by calling [phone]. Use warm salt water gargles (1 teaspoon salt per 1 quart warm water) before and after meals and at bedtime. Or you may rinse with 2 tablespoons of three-percent hydrogen peroxide mixed in eight ounces of water. If you are still having problems with your mouth or sores in your mouth please call the clinic. If you need dental work, please let the doctor know before you go for your appointment so that we can coordinate the best possible time for you in regards to your chemo regimen. You need to also let your dentist know that  you are actively taking chemo. We may need to do labs prior to your dental appointment.  Skin Care Always use sunscreen that has not expired and with SPF (Sun Protection Factor) of 50 or higher. Wear hats to protect your head from the sun. Remember to use sunscreen on your hands, ears, face, & feet.  Use good moisturizing lotions such as udder cream, eucerin, or even Vaseline. Some chemotherapies can cause dry skin, color changes in your skin and nails.    Avoid long, hot showers or baths. Use gentle, fragrance-free soaps and laundry detergent. Use moisturizers, preferably creams or ointments rather than lotions because the thicker consistency is better at preventing skin dehydration.  Apply the cream or ointment within 15 minutes of showering. Reapply moisturizer at night, and moisturize your hands every time after you wash them.   Infection Prevention Please wash your hands for at least 30 seconds using warm soapy water. Handwashing is the #1 way to prevent the spread of germs. Stay away from sick people or people who are getting over a cold. If you develop respiratory systems such as green/yellow mucus production or productive cough or persistent cough let us know and we will see if you need an antibiotic. It is a good idea to keep a pair of gloves on when going into grocery stores/[employer] to decrease your risk of coming into contact with germs on the carts, etc. Carry alcohol hand gel with you at all times and use it frequently if out in public. If your temperature reaches 100.5 or higher please call the clinic and let us know.  If it is after hours or on the weekend please go to the ER if your temperature is over 100.4.  Please have your own personal thermometer at home to use.    Sex and bodily fluids If you are going to have sex, a condom must be used to protect the person that isn't taking immunotherapy. For a few days after treatment, immunotherapy can be excreted through your bodily fluids.  When using the toilet please close the lid and flush the toilet twice.  Do this for a few day after you have had immunotherapy.   Contraception It is not known for sure whether or not immunotherapy drugs can be passed on through semen or secretions from the vagina. Because of this some doctors advise people to use a barrier method if you have sex during treatment. This applies to vaginal, anal or oral sex.  Generally, doctors advise a barrier method only for the time you are actually having the treatment and for about a week after your treatment.  Advice like this can be worrying, but this does not mean that you have to avoid being intimate with your partner. You can still have close  contact with your partner and continue to enjoy sex.  Animals If you have cats or birds we ask that you not change the litter or change the cage.  Please have someone else do this for you while you are on immunotherapy.   Food Safety During and After Cancer Treatment Food safety is important for people both during and after cancer treatment. Cancer and cancer treatments, such as chemotherapy, radiation therapy, and stem cell/bone marrow transplantation, often weaken the immune system. This makes it harder for your body to protect itself from foodborne illness, also called food poisoning. Foodborne illness is caused by eating food that contains harmful bacteria, parasites, or viruses.  Foods to avoid Some foods have a higher  risk of becoming tainted with bacteria. These include: Unwashed fresh fruit and vegetables, especially leafy vegetables that can hide dirt and other contaminants Raw sprouts, such as alfalfa sprouts Raw or undercooked beef, especially ground beef, or other raw or undercooked meat and poultry Fatty, fried, or spicy foods immediately before or after treatment.  These can sit heavy on your stomach and make you feel nauseous. Raw or undercooked shellfish, such as oysters. Sushi and sashimi, which often contain raw fish.  Unpasteurized beverages, such as unpasteurized fruit juices, raw milk, raw yogurt, or cider Undercooked eggs, such as soft boiled, over easy, and poached; raw, unpasteurized eggs; or foods made with raw egg, such as homemade raw cookie dough and homemade mayonnaise  Simple steps for food safety  Shop smart. Do not buy food stored or displayed in an unclean area. Do not buy bruised or damaged fruits or vegetables. Do not buy cans that have cracks, dents, or bulges. Pick up foods that can spoil at the end of your shopping trip and store them in a cooler on the way home.  Prepare and clean up foods carefully. Rinse all fresh fruits and vegetables under  running water, and dry them with a clean towel or paper towel. Clean the top of cans before opening them. After preparing food, wash your hands for 20 seconds with hot water and soap. Pay special attention to areas between fingers and under nails. Clean your utensils and dishes with hot water and soap. Disinfect your kitchen and cutting boards using 1 teaspoon of liquid, unscented bleach mixed into 1 quart of water.    Dispose of old food. Eat canned and packaged food before its expiration date (the "use by" or "best before" date). Consume refrigerated leftovers within 3 to 4 days. After that time, throw out the food. Even if the food does not smell or look spoiled, it still may be unsafe. Some bacteria, such as Listeria, can grow even on foods stored in the refrigerator if they are kept for too long.  Take precautions when eating out. At restaurants, avoid buffets and salad bars where food sits out for a long time and comes in contact with many people. Food can become contaminated when someone with a virus, often a norovirus, or another "bug" handles it. Put any leftover food in a "to-go" container yourself, rather than having the server do it. And, refrigerate leftovers as soon as you get home. Choose restaurants that are clean and that are willing to prepare your food as you order it cooked.    SYMPTOMS TO REPORT AS SOON AS POSSIBLE AFTER TREATMENT:  FEVER GREATER THAN 100.4 F CHILLS WITH OR WITHOUT FEVER NAUSEA AND VOMITING THAT IS NOT CONTROLLED WITH YOUR NAUSEA MEDICATION UNUSUAL SHORTNESS OF BREATH UNUSUAL BRUISING OR BLEEDING TENDERNESS IN MOUTH AND THROAT WITH OR WITHOUT PRESENCE OF ULCERS URINARY PROBLEMS BOWEL PROBLEMS UNUSUAL RASH     Wear comfortable clothing and clothing appropriate for easy access to any Portacath or PICC line. Let us know if there is anything that we can do to make your therapy better!   What to do if you need assistance after hours or on the  weekends: CALL [phone].  HOLD on the line, do not hang up.  You will hear multiple messages but at the end you will be connected with a nurse triage line.  They will contact the doctor if necessary.  Most of the time they will be able to assist you.  Do not call  the hospital operator.    I have been informed and understand all of the instructions given to me and have received a copy. I have been instructed to call the clinic [phone] or my family physician as soon as possible for continued medical care, if indicated. I do not have any more questions at this time but understand that I may call the [facility] or the Patient Navigator at [phone] during office hours should I have questions or need assistance in obtaining follow-up care.

## 2022-04-17 NOTE — Patient Instructions (Addendum)
[place] at [facility] Discharge Instructions   You were seen and examined today by Dr. Delton Coombes.  He discussed with your treatment options for your lung cancer. He talked to you about initiating treatment with a combination of chemotherapy drugs called carboplatin and gemcitabine. There is also immunotherapy drug called Opdivo that is given along with the chemotherapy drugs as part of your treatment. You will come to the clinic every two weeks in a row with one week off to receive treatment.   You will come in and have a chemotherapy education class with Ebony Hail, our chemotherapy educator, to discuss your treatment schedule, chemotherapy and immunotherapy drugs and side effects in detail, how to manage side effects at home, and when to call the clinic if certain side effects should arise. You will have the opportunity to ask questions during this visit. Please review the printed information about the drugs you will receive that was given to you at this visit and jot down any questions you may have.   Return as scheduled.    Thank you for choosing [place] at [facility] to provide your oncology and hematology care.  To afford each patient quality time with our provider, please arrive at least 15 minutes before your scheduled appointment time.   If you have a lab appointment with the [facility] please come in thru the Main Entrance and check in at the main information desk.  You need to re-schedule your appointment should you arrive 10 or more minutes late.  We strive to give you quality time with our providers, and arriving late affects you and other patients whose appointments are after yours.  Also, if you no show three or more times for appointments you may be dismissed from the clinic at the providers discretion.     Again, thank you for choosing [facility].  Our hope is that these requests will decrease the amount of time  that you wait before being seen by our physicians.       _____________________________________________________________  Should you have questions after your visit to [facility], please contact our office at [phone] and follow the prompts.  Our office hours are 8:00 a.m. and 4:30 p.m. Monday - Friday.  Please note that voicemails left after 4:00 p.m. may not be returned until the following business day.  We are closed weekends and major holidays.  You do have access to a nurse 24-7, just call the main number to the clinic [phone] and do not press any options, hold on the line and a nurse will answer the phone.    For prescription refill requests, have your pharmacy contact our office and allow 72 hours.    Due to Covid, you will need to wear a mask upon entering the hospital. If you do not have a mask, a mask will be given to you at the Main Entrance upon arrival. For doctor visits, patients may have 1 support person age 41 or older with them. For treatment visits, patients can not have anyone with them due to social distancing guidelines and our immunocompromised population.

## 2022-04-17 NOTE — Telephone Encounter (Signed)
  Pt's wife, she said, pt is ready to schedule his stress test pt is going to get a lung surgery soon and need to make sure he is heart healthy. Need new order for stress test

## 2022-04-17 NOTE — Telephone Encounter (Signed)
Please advise if its okay to proceed with lexiscan-never scheduled due to issues with toe infection. Lexi originally ordered at last visit

## 2022-04-17 NOTE — Progress Notes (Signed)
START ON PATHWAY REGIMEN - Non-Small Cell Lung     A cycle is every 21 days:     Nivolumab      Gemcitabine      Cisplatin   **Always confirm dose/schedule in your pharmacy ordering system**  Patient Characteristics: Preoperative or Nonsurgical Candidate (Clinical Staging), Stage II, Surgical Candidate, EGFR Negative and ALK Negative, AND Neoadjuvant Chemo/Immunotherapy Preferred, Squamous Cell Therapeutic Status: Preoperative or Nonsurgical Candidate (Clinical Staging) AJCC T Category: cT1c AJCC N Category: cN1 AJCC M Category: cM0 AJCC 8 Stage Grouping: IIB ALK Rearrangement Status: Negative Neoadjuvant Chemo/Immunotherapy Preferred<= Yes EGFR Mutation Status: Negative/Wild Type Histology: Squamous Cell Intent of Therapy: Curative Intent, Discussed with Patient

## 2022-04-17 NOTE — Progress Notes (Signed)
[facility] [address], [place] [address]    Clinic Day:  04/17/2022  Referring physician: Baruch Gouty, FNP  Patient Care Team: Baruch Gouty, FNP as PCP - General (Family Medicine) Mallipeddi, Quenten Raven, MD as PCP - Cardiology (Cardiology) Derek Jack, MD as Medical Oncologist (Medical Oncology) Brien Mates, RN as Oncology Nurse Navigator (Medical Oncology)   ASSESSMENT & PLAN:   Assessment: 1.  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  Right infrahilar nodules or lymph node 1.6 cm SUV 9.25.  Small ill-defined lucent lesion within the posterior aspect of the right medial femoral condyle, FDG avid.  Lesion from metastasis or multiple myeloma less favored. - MRI brain (12/05/2021): No metastatic disease - CT super D chest (11/29/2021): Lobulated lesion in the right lower lobe measuring 2.3 x 1.8 cm.  Adjacent smoothly marginated rounded lesion in the right hilum, likely adjacent lymph node, difficult to measure. - Bronchoscopy/EBUS (11/29/2021): Normal right and left lung anatomy, no evidence of endobronchial lesion.  Enlarged right hilar adenopathy.  No subcarinal adenopathy. - Pathology (11/29/2021): Lymph node 11 R FNA-squamous cell carcinoma - Guardant360 tissue (11/30/2021): PD-L1 22 C3 TPS 80% - Guardant[employer] liquid biopsy (11/29/2021): BRCA2 E2720, T p53, PTEN, MSI-high not detected.  Negative for EGFR/ALK. - PET scan (03/13/2022): Central hilar component on the right side approximately 3.1 x 2.1 cm, SUV 9.  More posterior component measures 2.1 x 1.5 cm with SUV 4.5.  Mildly increased metabolic activity in the liver adjacent to the cholecystectomy bed.  No evidence of progression or metastatic disease.   2.  Social/family history: - He lives at home with his wife.  He worked as a Ecologist prior to retirement.  He  smoked 2 packs/day of cigarettes for 40 years and quit smoking cigarettes 10 years ago.  He uses high nicotine vapes. - No family history of malignancies.   3.  IgG lambda MGUS: - SPEP on 10/07/2021, M spike 0.3 g. - Free kappa light chains 23.8, ratio 1.15.  Beta-2 microglobulin 2.6.  LDH 126.  Creatinine 1.41 and calcium normal.  Hemoglobin normal. - Skeletal survey (10/25/2021): Cannot exclude new small lucent lesion in the lateral right femoral head measuring 14 mm. - PET scan (11/07/2021): Small ill-defined lucent lesion within the posterior aspect of the right medial femoral condyle which is FDG avid.  Indeterminate.  Unusual location for lung cancer and lesion of multiple myeloma is considered less favored.  Sclerotic lesion within the distal diaphysis of the right tibia with possible chondroid matrix.  This chronic benign finding was present in the MRI from May 2007.  Plan: 1.  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 Roxan Hockey.  Neoadjuvant systemic chemoimmunotherapy was recommended followed by restaging scan. - Due to his peripheral neuropathy, not a candidate for Taxol. - Recommend carboplatin AUC 5, gemcitabine 1000 mg/m on day 1 and day 8 every 21 days with nivolumab 360 mg on day 1 for 3 cycles followed by repeat staging. - He already has port placed. - He is scheduled for a MRI of the liver to clarify on the uptake on PET scan. - We talked about the chemotherapy regimen and side effects in detail. - He will proceed with cycle 1 day 1 after  chemo education.  I will see him on day 8.    No orders of the defined types were placed in this encounter.     I,Alexis Herring,acting as a Education administrator for [employer], MD.,have documented all relevant documentation on the behalf of Derek Jack, MD,as directed by  Derek Jack, MD while in the presence of Derek Jack, MD.  I,  Derek Jack MD, have reviewed the above documentation for accuracy and completeness, and I agree with the above.    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     INTERVAL HISTORY:   Juan Yang is a 67 y.o. male presenting to clinic today for right lung squamous cell carcinoma. He was last seen by me on 03/26/22. He was seen by Dr. Modesto Charon yesterday for surgery consult.  He is ready to move on with neoadjuvant chemotherapy.  Today, he states that he is doing well overall. His appetite level is at 40%. His energy level is at 40%.   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   History of kidney stones    Hypertension    Peripheral neuropathy 10/29/2016   Pneumonia    Transient cerebral ischemic attack, unspecified 05/10/2020   stroke    Surgical History: Past Surgical History:  Procedure Laterality Date   BRONCHIAL NEEDLE ASPIRATION BIOPSY  11/29/2021   Procedure: BRONCHIAL NEEDLE ASPIRATION BIOPSIES;  Surgeon: Garner Nash, DO;  Location: [facility];  Service:  Pulmonary;;   CHOLECYSTECTOMY N/A 12/13/2021   Procedure: LAPAROSCOPIC CHOLECYSTECTOMY;  Surgeon: Virl Cagey, MD;  Location: [facility];  Service: General;  Laterality: N/A;   COLONOSCOPY WITH PROPOFOL N/A 11/14/2016   Procedure: COLONOSCOPY WITH PROPOFOL;  Surgeon: Rogene Houston, MD;  Location: [facility];  Service: Endoscopy;  Laterality: N/A;  9:15   IR IMAGING GUIDED PORT INSERTION  12/24/2021   LUMBAR LAMINECTOMY/DECOMPRESSION MICRODISCECTOMY Right 05/24/2019   Procedure: LAMINECTOMY AND FORAMINOTOMY LUMBAR FIVE- SACRAL ONE RIGHT;  Surgeon: Vallarie Mare, MD;  Location: [facility];  Service: Neurosurgery;  Laterality: Right;  posterior/right   LUNG BIOPSY     MOHS SURGERY Right 05/2020   ear   MOHS SURGERY     POLYPECTOMY  11/14/2016   Procedure: POLYPECTOMY- SIGMOID COLON X3 TRANSVERSE COLON X2;  Surgeon: Rogene Houston, MD;  Location: [facility];  Service: Endoscopy;;   rt ankle surgery     Titanium hinge   TONSILLECTOMY     TRANSFORAMINAL LUMBAR INTERBODY FUSION (  TLIF) WITH PEDICLE SCREW FIXATION 1 LEVEL N/A 05/02/2020   Procedure: Lumbar five Sacral one Transforaminal lumbar interbody fusion;  Surgeon: Vallarie Mare, MD;  Location: [facility];  Service: Neurosurgery;  Laterality: N/A;   VIDEO BRONCHOSCOPY WITH ENDOBRONCHIAL ULTRASOUND N/A 11/29/2021   Procedure: VIDEO BRONCHOSCOPY WITH ENDOBRONCHIAL ULTRASOUND;  Surgeon: Garner Nash, DO;  Location: [facility];  Service: Pulmonary;  Laterality: N/A;    Social History: Social History   Socioeconomic History   Marital status: Married    Spouse name: Not on file   Number of children: Not on file   Years of education: Not on file   Highest education level: Not on file  Occupational History   Not on file  Tobacco Use   Smoking status: Former    Packs/day: 1.50    Years: 20.00    Total pack years: 30.00    Types: Cigarettes    Quit date: 11/12/2012    Years since quitting: 9.4    Passive exposure: Never    Smokeless tobacco: Never   Tobacco comments:    Pt smokes 1 pack a month and vapes CBD oil. ARJ 11/22/21  Vaping Use   Vaping Use: Former   Quit date: 12/18/2021   Substances: CBD  Substance and Sexual Activity   Alcohol use: No   Drug use: No   Sexual activity: Yes    Birth control/protection: None  Other Topics Concern   Not on file  Social History Narrative   Right handed   Lives with wife in one story home   Social Determinants of Health   Financial Resource Strain: Not on file  Food Insecurity: No Food Insecurity (12/19/2021)   Hunger Vital Sign    Worried About Running Out of Food in the Last Year: Never true    Ran Out of Food in the Last Year: Never true  Transportation Needs: No Transportation Needs (12/19/2021)   PRAPARE - Hydrologist (Medical): No    Lack of Transportation (Non-Medical): No  Physical Activity: Inactive (05/30/2020)   Exercise Vital Sign    Days of Exercise per Week: 0 days    Minutes of Exercise per Session: 0 min  Stress: Not on file  Social Connections: Not on file  Intimate Partner Violence: Not At Risk (12/18/2021)   Humiliation, Afraid, Rape, and Kick questionnaire    Fear of Current or Ex-Partner: No    Emotionally Abused: No    Physically Abused: No    Sexually Abused: No    Family History: Family History  Problem Relation Age of Onset   Charcot-Marie-Tooth disease Mother    Heart disease Father    COPD Father    Arthritis Sister     Current Medications:  Current Outpatient Medications:    amLODipine (NORVASC) 2.5 MG tablet, Take 2.5 mg by mouth 2 (two) times daily., Disp: , Rfl:    aspirin EC (ADULT ASPIRIN REGIMEN) 81 MG tablet, Take 81-325 mg by mouth daily., Disp: , Rfl:    cyclobenzaprine (FLEXERIL) 10 MG tablet, Take 1 tablet (10 mg total) by mouth 3 (three) times daily as needed for muscle spasms., Disp: 30 tablet, Rfl: 0   furosemide (LASIX) 40 MG tablet, Take 1 tablet (40 mg total) by  mouth daily., Disp: 30 tablet, Rfl: 3   Glucosamine HCl-MSM (GLUCOSAMINE-MSM PO), Take 1 tablet by mouth daily., Disp: , Rfl:    losartan (COZAAR) 25 MG tablet, Take 25 mg by mouth daily., Disp: ,  Rfl:    metoprolol succinate (TOPROL-XL) 25 MG 24 hr tablet, Take 1 tablet (25 mg total) by mouth daily., Disp: 90 tablet, Rfl: 3   naproxen sodium (ALEVE) 220 MG tablet, Take 220 mg by mouth daily as needed (pain)., Disp: , Rfl:    oxyCODONE (OXY IR/ROXICODONE) 5 MG immediate release tablet, Take 1-2 tablets (5-10 mg total) by mouth every 4 (four) hours as needed for breakthrough pain or severe pain., Disp: 20 tablet, Rfl: 0   Potassium 99 MG TABS, Take 99 mg by mouth daily as needed (low potassium)., Disp: , Rfl:    sildenafil (VIAGRA) 100 MG tablet, Take 100 mg by mouth daily as needed for erectile dysfunction., Disp: , Rfl:    sodium bicarbonate 650 MG tablet, Take 650 mg by mouth 2 (two) times daily., Disp: , Rfl:    Allergies: Allergies  Allergen Reactions   Other Hives    DUCK EGGS ONLY   Atenolol Diarrhea and Nausea Only    Pt unsure of allergy? Possible fainting?   Cymbalta [Duloxetine Hcl] Other (See Comments)    Pt states it shut his bladder down and could not urinate; states it made him lethargic as well.   Latex Itching   Penicillins Hives    Did it involve swelling of the face/tongue/throat, SOB, or low BP? Unknown Did it involve sudden or severe rash/hives, skin peeling, or any reaction on the inside of your mouth or nose? Unknown Did you need to seek medical attention at a hospital or doctor's office? yes When did it last happen?      Childhood If all above answers are "NO", may proceed with cephalosporin use.    REVIEW OF SYSTEMS:   Review of Systems  Constitutional:  Negative for chills, fatigue and fever.  HENT:   Negative for lump/mass, mouth sores, nosebleeds, sore throat and trouble swallowing.   Eyes:  Negative for eye problems.  Respiratory:  Negative for cough and  shortness of breath (On exertion).   Cardiovascular:  Negative for chest pain, leg swelling and palpitations.  Gastrointestinal:  Negative for abdominal pain, constipation, diarrhea, nausea and vomiting.  Genitourinary:  Negative for bladder incontinence, difficulty urinating, dysuria, frequency, hematuria and nocturia.   Musculoskeletal:  Positive for back pain. Negative for arthralgias, flank pain, myalgias and neck pain.  Skin:  Negative for itching and rash.  Neurological:  Positive for numbness. Negative for dizziness and headaches.  Hematological:  Does not bruise/bleed easily.  Psychiatric/Behavioral:  Negative for depression, sleep disturbance and suicidal ideas. The patient is nervous/anxious.   All other systems reviewed and are negative.    VITALS:   Blood pressure (!) 134/90, pulse (!) 108, temperature 98.5 F (36.9 C), temperature source Oral, resp. rate 18, height '6\' 1"'$  (1.854 m), weight 199 lb 11.2 oz (90.6 kg), SpO2 98 %.  Wt Readings from Last 3 Encounters:  04/17/22 199 lb 11.2 oz (90.6 kg)  04/15/22 197 lb (89.4 kg)  03/26/22 198 lb (89.8 kg)    Body mass index is 26.35 kg/m.  Performance status (ECOG): 1 - Symptomatic but completely ambulatory  PHYSICAL EXAM:   Physical Exam Vitals and nursing note reviewed. Exam conducted with a chaperone present.  Constitutional:      Appearance: Normal appearance.  Cardiovascular:     Rate and Rhythm: Normal rate and regular rhythm.     Pulses: Normal pulses.     Heart sounds: Normal heart sounds.  Pulmonary:     Effort: Pulmonary effort is  normal.     Breath sounds: Normal breath sounds.  Abdominal:     Palpations: Abdomen is soft. There is no hepatomegaly, splenomegaly or mass.     Tenderness: There is no abdominal tenderness.  Musculoskeletal:     Right lower leg: No edema.     Left lower leg: No edema.  Lymphadenopathy:     Cervical: No cervical adenopathy.     Right cervical: No superficial, deep or posterior  cervical adenopathy.    Left cervical: No superficial, deep or posterior cervical adenopathy.     Upper Body:     Right upper body: No supraclavicular or axillary adenopathy.     Left upper body: No supraclavicular or axillary adenopathy.  Neurological:     General: No focal deficit present.     Mental Status: He is alert and oriented to person, place, and time.  Psychiatric:        Mood and Affect: Mood normal.        Behavior: Behavior normal.     LABS:      Latest Ref Rng & Units 02/19/2022    5:46 AM 02/14/2022    5:41 AM 02/13/2022    7:43 AM  CBC  WBC 4.0 - 10.5 K/uL 11.2  11.7  11.5   Hemoglobin 13.0 - 17.0 g/dL 14.5  14.0  13.7   Hematocrit 39.0 - 52.0 % 43.5  42.9  40.2   Platelets 150 - 400 K/uL 225  242  249       Latest Ref Rng & Units 02/18/2022    5:36 AM 02/17/2022    6:24 AM 02/16/2022    5:48 AM  CMP  Glucose 70 - 99 mg/dL 103  158  114   BUN 8 - 23 mg/dL '14  16  17   '$ Creatinine 0.61 - 1.24 mg/dL 1.10  1.20  1.35   Sodium 135 - 145 mmol/L 135  135  136   Potassium 3.5 - 5.1 mmol/L 3.9  3.3  3.5   Chloride 98 - 111 mmol/L 105  101  104   CO2 22 - 32 mmol/L '22  24  24   '$ Calcium 8.9 - 10.3 mg/dL 8.8  8.8  8.5      No results found for: "CEA1", "CEA" / No results found for: "CEA1", "CEA" No results found for: "PSA1" No results found for: "[id number]" No results found for: "CAN125"  Lab Results  Component Value Date   TOTALPROTELP 6.9 10/07/2021   TOTALPROTELP 6.9 10/07/2021   ALBUMINELP 3.6 10/07/2021   A1GS 0.2 10/07/2021   A2GS 0.8 10/07/2021   BETS 1.2 10/07/2021   BETA2SER 0.4 04/11/2020   GAMS 1.1 10/07/2021   MSPIKE 0.3 (H) 10/07/2021   SPEI Comment 10/07/2021   No results found for: "TIBC", "FERRITIN", "IRONPCTSAT" Lab Results  Component Value Date   LDH 126 10/07/2021   LDH 162 04/11/2021   LDH 142 05/30/2020     STUDIES:   No results found.

## 2022-04-18 ENCOUNTER — Telehealth: Payer: Self-pay | Admitting: Internal Medicine

## 2022-04-18 NOTE — Telephone Encounter (Signed)
Checking percert on the following patient for testing scheduled at [facility].   LEXISCAN   04/23/2022

## 2022-04-20 ENCOUNTER — Ambulatory Visit (HOSPITAL_COMMUNITY)
Admission: RE | Admit: 2022-04-20 | Discharge: 2022-04-20 | Disposition: A | Discharge status: Home / Self Care | Payer: PPO | Source: Ambulatory Visit | Attending: Hematology | Admitting: Hematology

## 2022-04-20 DIAGNOSIS — K769 Liver disease, unspecified: Secondary | ICD-10-CM | POA: Diagnosis present

## 2022-04-20 MED ORDER — GADOBUTROL 1 MMOL/ML IV SOLN
9.0000 mL | Freq: Once | INTRAVENOUS | Status: AC | PRN
  Administered 2022-04-20: 9 mL via INTRAVENOUS

## 2022-04-22 ENCOUNTER — Other Ambulatory Visit (HOSPITAL_COMMUNITY): Payer: PPO

## 2022-04-23 ENCOUNTER — Ambulatory Visit: Payer: Self-pay | Admitting: Physician Assistant

## 2022-04-23 ENCOUNTER — Ambulatory Visit: Payer: Medicare Other | Admitting: Physician Assistant

## 2022-04-23 ENCOUNTER — Ambulatory Visit (HOSPITAL_COMMUNITY)
Admission: RE | Admit: 2022-04-23 | Discharge: 2022-04-23 | Disposition: A | Discharge status: Home / Self Care | Payer: PPO | Source: Ambulatory Visit | Attending: Internal Medicine | Admitting: Internal Medicine

## 2022-04-23 ENCOUNTER — Encounter (HOSPITAL_COMMUNITY): Payer: Self-pay

## 2022-04-23 ENCOUNTER — Encounter (HOSPITAL_COMMUNITY)
Admission: RE | Admit: 2022-04-23 | Discharge: 2022-04-23 | Disposition: A | Discharge status: Home / Self Care | Payer: PPO | Source: Ambulatory Visit | Attending: Internal Medicine | Admitting: Internal Medicine

## 2022-04-23 DIAGNOSIS — R0609 Other forms of dyspnea: Secondary | ICD-10-CM | POA: Diagnosis not present

## 2022-04-23 LAB — NM MYOCAR MULTI W/SPECT W/WALL MOTION / EF
LV dias vol: 94 mL (ref 62–150)
LV sys vol: 34 mL
Nuc Stress EF: 64 %
RATE: 0.4
Rest Nuclear Isotope Dose: 10.4 mCi
SDS: 1
SRS: 0
SSS: 1
ST Depression (mm): 0 mm
Stress Nuclear Isotope Dose: 29.8 mCi
TID: 1.07

## 2022-04-23 MED ORDER — TECHNETIUM TC 99M TETROFOSMIN IV KIT
10.0000 | PACK | Freq: Once | INTRAVENOUS | Status: AC | PRN
  Administered 2022-04-23: 10.4 via INTRAVENOUS

## 2022-04-23 MED ORDER — SODIUM CHLORIDE FLUSH 0.9 % IV SOLN
INTRAVENOUS | Status: AC
  Administered 2022-04-23: 10 mL via INTRAVENOUS
  Filled 2022-04-23: qty 10

## 2022-04-23 MED ORDER — TECHNETIUM TC 99M TETROFOSMIN IV KIT
30.0000 | PACK | Freq: Once | INTRAVENOUS | Status: AC | PRN
  Administered 2022-04-23: 29.8 via INTRAVENOUS

## 2022-04-23 MED ORDER — REGADENOSON 0.4 MG/5ML IV SOLN
INTRAVENOUS | Status: AC
  Administered 2022-04-23: 0.4 mg
  Filled 2022-04-23: qty 5

## 2022-04-25 ENCOUNTER — Encounter: Payer: Self-pay | Admitting: Physical Medicine & Rehabilitation

## 2022-04-30 ENCOUNTER — Inpatient Hospital Stay: Payer: PPO

## 2022-04-30 ENCOUNTER — Encounter: Payer: Self-pay | Admitting: Hematology

## 2022-04-30 DIAGNOSIS — Z95828 Presence of other vascular implants and grafts: Secondary | ICD-10-CM | POA: Insufficient documentation

## 2022-04-30 DIAGNOSIS — C3491 Malignant neoplasm of unspecified part of right bronchus or lung: Secondary | ICD-10-CM

## 2022-04-30 DIAGNOSIS — Z5112 Encounter for antineoplastic immunotherapy: Secondary | ICD-10-CM | POA: Diagnosis not present

## 2022-04-30 HISTORY — DX: Presence of other vascular implants and grafts: Z95.828

## 2022-04-30 LAB — CBC WITH DIFFERENTIAL/PLATELET
Abs Immature Granulocytes: 0.02 10*3/uL (ref 0.00–0.07)
Basophils Absolute: 0.1 10*3/uL (ref 0.0–0.1)
Basophils Relative: 1 %
Eosinophils Absolute: 0.2 10*3/uL (ref 0.0–0.5)
Eosinophils Relative: 3 %
HCT: 40.1 % (ref 39.0–52.0)
Hemoglobin: 13.7 g/dL (ref 13.0–17.0)
Immature Granulocytes: 0 %
Lymphocytes Relative: 27 %
Lymphs Abs: 2 10*3/uL (ref 0.7–4.0)
MCH: 30.4 pg (ref 26.0–34.0)
MCHC: 34.2 g/dL (ref 30.0–36.0)
MCV: 88.9 fL (ref 80.0–100.0)
Monocytes Absolute: 0.6 10*3/uL (ref 0.1–1.0)
Monocytes Relative: 8 %
Neutro Abs: 4.6 10*3/uL (ref 1.7–7.7)
Neutrophils Relative %: 61 %
Platelets: 251 10*3/uL (ref 150–400)
RBC: 4.51 MIL/uL (ref 4.22–5.81)
RDW: 12.7 % (ref 11.5–15.5)
WBC: 7.6 10*3/uL (ref 4.0–10.5)
nRBC: 0 % (ref 0.0–0.2)

## 2022-04-30 LAB — COMPREHENSIVE METABOLIC PANEL
ALT: 14 U/L (ref 0–44)
AST: 20 U/L (ref 15–41)
Albumin: 3.6 g/dL (ref 3.5–5.0)
Alkaline Phosphatase: 98 U/L (ref 38–126)
Anion gap: 8 (ref 5–15)
BUN: 16 mg/dL (ref 8–23)
CO2: 23 mmol/L (ref 22–32)
Calcium: 8.5 mg/dL — ABNORMAL LOW (ref 8.9–10.3)
Chloride: 103 mmol/L (ref 98–111)
Creatinine, Ser: 1.3 mg/dL — ABNORMAL HIGH (ref 0.61–1.24)
GFR, Estimated: >60 mL/min (ref >60)
Glucose, Bld: 97 mg/dL (ref 70–99)
Potassium: 3.8 mmol/L (ref 3.5–5.1)
Sodium: 134 mmol/L — ABNORMAL LOW (ref 135–145)
Total Bilirubin: 0.5 mg/dL (ref 0.3–1.2)
Total Protein: 6.9 g/dL (ref 6.5–8.1)

## 2022-04-30 LAB — MAGNESIUM: Magnesium: 2.2 mg/dL (ref 1.7–2.4)

## 2022-04-30 MED ORDER — SODIUM CHLORIDE 0.9% FLUSH
10.0000 mL | Freq: Once | INTRAVENOUS | Status: AC
  Administered 2022-04-30: 10 mL via INTRAVENOUS

## 2022-04-30 MED ORDER — HEPARIN SOD (PORK) LOCK FLUSH 100 UNIT/ML IV SOLN
500.0000 [IU] | Freq: Once | INTRAVENOUS | Status: AC
  Administered 2022-04-30: 500 [IU] via INTRAVENOUS

## 2022-04-30 MED ORDER — PROCHLORPERAZINE MALEATE 10 MG PO TABS: 10.0000 mg | ORAL_TABLET | Freq: Four times a day (QID) | ORAL | 3 refills | Status: DC | PRN

## 2022-04-30 MED ORDER — LIDOCAINE-PRILOCAINE 2.5-2.5 % EX CREA: TOPICAL_CREAM | CUTANEOUS | 3 refills | Status: DC

## 2022-04-30 NOTE — Progress Notes (Signed)
Chemotherapy/immunotherapy education packet given and discussed with pt and family in detail.  Discussed diagnosis and staging, tx regimen, and intent of tx.  Reviewed chemotherapy/immunotherapy medications and side effects, as well as pre-medications.  Instructed on how to manage side effects at home, and when to call the clinic.  Importance of fever/chills discussed with pt and family. Discussed precautions to implement at home after receiving tx, as well as self care strategies. Phone numbers provided for clinic during regular working hours, also how to reach the clinic after hours and on weekends. Pt and family provided the opportunity to ask questions - all questions answered to pt's and family satisfaction.  °

## 2022-04-30 NOTE — Progress Notes (Signed)
Patients port flushed without difficulty.  Good blood return noted with no bruising or swelling noted at site.  Band aid applied.  VSS with discharge and left in satisfactory condition with no s/s of distress noted.   

## 2022-05-01 ENCOUNTER — Inpatient Hospital Stay: Payer: PPO | Admitting: Licensed Clinical Social Worker

## 2022-05-01 ENCOUNTER — Inpatient Hospital Stay: Payer: PPO

## 2022-05-01 ENCOUNTER — Inpatient Hospital Stay: Payer: PPO | Admitting: Dietician

## 2022-05-01 VITALS — BP 122/78 | HR 74 | Temp 98.1°F | Resp 18 | Wt 207.5 lb

## 2022-05-01 DIAGNOSIS — C3490 Malignant neoplasm of unspecified part of unspecified bronchus or lung: Secondary | ICD-10-CM

## 2022-05-01 DIAGNOSIS — Z95828 Presence of other vascular implants and grafts: Secondary | ICD-10-CM

## 2022-05-01 DIAGNOSIS — C3491 Malignant neoplasm of unspecified part of right bronchus or lung: Secondary | ICD-10-CM

## 2022-05-01 DIAGNOSIS — Z5112 Encounter for antineoplastic immunotherapy: Secondary | ICD-10-CM | POA: Diagnosis not present

## 2022-05-01 LAB — TSH: TSH: 4.6 u[IU]/mL — ABNORMAL HIGH (ref 0.350–4.500)

## 2022-05-01 MED ORDER — SODIUM CHLORIDE 0.9 % IV SOLN
150.0000 mg | Freq: Once | INTRAVENOUS | Status: AC
  Administered 2022-05-01: 150 mg via INTRAVENOUS
  Filled 2022-05-01: qty 150

## 2022-05-01 MED ORDER — SODIUM CHLORIDE 0.9 % IV SOLN
510.0000 mg | Freq: Once | INTRAVENOUS | Status: AC
  Administered 2022-05-01: 510 mg via INTRAVENOUS
  Filled 2022-05-01: qty 51

## 2022-05-01 MED ORDER — PALONOSETRON HCL INJECTION 0.25 MG/5ML
0.2500 mg | Freq: Once | INTRAVENOUS | Status: AC
  Administered 2022-05-01: 0.25 mg via INTRAVENOUS
  Filled 2022-05-01: qty 5

## 2022-05-01 MED ORDER — SODIUM CHLORIDE 0.9% FLUSH
10.0000 mL | INTRAVENOUS | Status: DC | PRN
  Administered 2022-05-01: 10 mL

## 2022-05-01 MED ORDER — SODIUM CHLORIDE 0.9 % IV SOLN
1000.0000 mg/m2 | Freq: Once | INTRAVENOUS | Status: AC
  Administered 2022-05-01: 2166 mg via INTRAVENOUS
  Filled 2022-05-01: qty 56.97

## 2022-05-01 MED ORDER — HEPARIN SOD (PORK) LOCK FLUSH 100 UNIT/ML IV SOLN
500.0000 [IU] | Freq: Once | INTRAVENOUS | Status: AC | PRN
  Administered 2022-05-01: 500 [IU]

## 2022-05-01 MED ORDER — SODIUM CHLORIDE 0.9 % IV SOLN
360.0000 mg | Freq: Once | INTRAVENOUS | Status: AC
  Administered 2022-05-01: 360 mg via INTRAVENOUS
  Filled 2022-05-01: qty 24

## 2022-05-01 MED ORDER — SODIUM CHLORIDE 0.9 % IV SOLN
10.0000 mg | Freq: Once | INTRAVENOUS | Status: AC
  Administered 2022-05-01: 10 mg via INTRAVENOUS
  Filled 2022-05-01: qty 10

## 2022-05-01 MED ORDER — SODIUM CHLORIDE 0.9 % IV SOLN: Freq: Once | INTRAVENOUS | Status: AC

## 2022-05-01 NOTE — Patient Instructions (Signed)
[facility]  Discharge Instructions: Thank you for choosing [facility] to provide your oncology and hematology care.  If you have a lab appointment with the [facility], please come in thru the Main Entrance and check in at the main information desk.  Wear comfortable clothing and clothing appropriate for easy access to any Portacath or PICC line.   We strive to give you quality time with your provider. You may need to reschedule your appointment if you arrive late (15 or more minutes).  Arriving late affects you and other patients whose appointments are after yours.  Also, if you miss three or more appointments without notifying the office, you may be dismissed from the clinic at the provider's discretion.      For prescription refill requests, have your pharmacy contact our office and allow 72 hours for refills to be completed.    Today you received the following chemotherapy and/or immunotherapy agents Carboplatin/Opdivo/Gemzar      To help prevent nausea and vomiting after your treatment, we encourage you to take your nausea medication as directed.  BELOW ARE SYMPTOMS THAT SHOULD BE REPORTED IMMEDIATELY: *FEVER GREATER THAN 100.4 F (38 C) OR HIGHER *CHILLS OR SWEATING *NAUSEA AND VOMITING THAT IS NOT CONTROLLED WITH YOUR NAUSEA MEDICATION *UNUSUAL SHORTNESS OF BREATH *UNUSUAL BRUISING OR BLEEDING *URINARY PROBLEMS (pain or burning when urinating, or frequent urination) *BOWEL PROBLEMS (unusual diarrhea, constipation, pain near the anus) TENDERNESS IN MOUTH AND THROAT WITH OR WITHOUT PRESENCE OF ULCERS (sore throat, sores in mouth, or a toothache) UNUSUAL RASH, SWELLING OR PAIN  UNUSUAL VAGINAL DISCHARGE OR ITCHING   Items with * indicate a potential emergency and should be followed up as soon as possible or go to the Emergency Department if any problems should occur.  Please show the CHEMOTHERAPY ALERT CARD or IMMUNOTHERAPY ALERT CARD at  check-in to the Emergency Department and triage nurse.  Should you have questions after your visit or need to cancel or reschedule your appointment, please contact [facility] [phone]  and follow the prompts.  Office hours are 8:00 a.m. to 4:30 p.m. Monday - Friday. Please note that voicemails left after 4:00 p.m. may not be returned until the following business day.  We are closed weekends and major holidays. You have access to a nurse at all times for urgent questions. Please call the main number to the clinic [phone] and follow the prompts.  For any non-urgent questions, you may also contact your provider using MyChart. We now offer e-Visits for anyone 88 and older to request care online for non-urgent symptoms. For details visit [email or web address].   Also download the MyChart app! Go to the app store, search "MyChart", open the app, select [facility], and log in with your MyChart username and password.

## 2022-05-01 NOTE — Progress Notes (Signed)
Pharmacist Chemotherapy Monitoring - Initial Assessment    Anticipated start date: 05/01/22   The following has been reviewed per standard work regarding the patient's treatment regimen: The patient's diagnosis, treatment plan and drug doses, and organ/hematologic function Lab orders and baseline tests specific to treatment regimen  The treatment plan start date, drug sequencing, and pre-medications Prior authorization status  Patient's documented medication list, including drug-drug interaction screen and prescriptions for anti-emetics and supportive care specific to the treatment regimen The drug concentrations, fluid compatibility, administration routes, and timing of the medications to be used The patient's access for treatment and lifetime cumulative dose history, if applicable  The patient's medication allergies and previous infusion related reactions, if applicable   Changes made to treatment plan:  N/A  Follow up needed:  N/A   Wynona Neat, [facility], 05/01/2022  10:35 AM

## 2022-05-01 NOTE — Progress Notes (Signed)
Nutrition Assessment   Reason for Assessment: Poor appetite    ASSESSMENT: 67 year old male with newly diagnosed stage Ilb SCC of RLL. He is receiving neoadjuvant chemoimmunotherapy with carboplatin/gemcitabine + nivolumab q21d. (First 3/21). Patient is under the care of Dr. Delton Coombes.  Past medical history includes AAA, CKD3, anxiety, HTN, peripheral neuopathy, TIA  Met with patient and wife in infusion. He reports feeling nervous about first treatment, but overall doing well. Patient reports appetite comes and goes at baseline. Patient will usually have 1 meal a day and a snack. Patient states his appetite is like a switch - often will go an entire day without eating or eats small amounts. Patient drinks several Mt. Dew through out the day. He does not drink water. Patient endorses fluctuating weight pattern due to fluid and medications. Patient taking lasix as prescribed. Patient reports he stopped taking gabapentin. Since discontinuing use, he reports decreased lower extremity swelling.   Nutrition Focused Physical Exam: deferred     Medications: compazine, oxycodone, toprol, losartan, flexeril, amlodipine, lasix 40 mg   Labs: Na 134, Cr 1.30, Ca 8.5, albumin - WNL   Anthropometrics:   Height: 6'1" Weight: 207 lb 8 oz  UBW: 215 lb (Oct 2023) BMI: 27.38   NUTRITION DIAGNOSIS: Inadequate protein and calorie energy intake related to chronic illness as evidenced by dietary recall     INTERVENTION:  Educated on importance of adequate calories and protein to maintain strength/weights during treatment Encouraged smaller more frequent meals every 3 hours - handout with snack ideas provided Discussed empty calories provided in sodas. Encouraged decreasing as able while working to increase intake of water  Discussed the importance of hydration - recommend 80 ounces/day (provided pedialyte hydration packets for pt to try) Educated on benefits of ONS with poor appetite - samples of  glucerna, ensure, ensure complete provided for pt to try   MONITORING, EVALUATION, GOAL: Patient will tolerate increased calories and protein to minimize weight loss during treatment    Next Visit: Thursday April 18 during infusion

## 2022-05-01 NOTE — Progress Notes (Signed)
Patient presents today for D1C1 Opdivo/Gemzar/Carboplatin per providers order.  Vital signs and labs within parameters for treatment.  Patient has no new complaints at this time.    Treatment given today per MD orders.  Stable during infusion without adverse affects.  Vital signs stable.  No complaints at this time.  Discharge from clinic ambulatory in stable condition.  Alert and oriented X 3.  Follow up with [facility] as scheduled.

## 2022-05-01 NOTE — Progress Notes (Signed)
[facility] CSW Progress Note  Clinical Social Worker  scheduled to assess pt today.  CSW working off-site and unable to reach pt by phone as he is in infusion today for treatment.  CSW to call pt on 05/05/2022 to assess.        Henriette Combs, LCSW

## 2022-05-02 ENCOUNTER — Telehealth: Payer: Self-pay

## 2022-05-02 NOTE — Telephone Encounter (Signed)
Patient called for 24-hour follow-up. No response from patient. 

## 2022-05-06 ENCOUNTER — Ambulatory Visit: Payer: PPO | Admitting: Internal Medicine

## 2022-05-08 ENCOUNTER — Ambulatory Visit (HOSPITAL_COMMUNITY): Payer: PPO

## 2022-05-08 ENCOUNTER — Inpatient Hospital Stay: Payer: PPO

## 2022-05-08 ENCOUNTER — Inpatient Hospital Stay (HOSPITAL_BASED_OUTPATIENT_CLINIC_OR_DEPARTMENT_OTHER): Payer: PPO | Admitting: Hematology

## 2022-05-08 VITALS — BP 119/81 | HR 77 | Temp 97.5°F | Resp 18

## 2022-05-08 DIAGNOSIS — Z95828 Presence of other vascular implants and grafts: Secondary | ICD-10-CM | POA: Diagnosis not present

## 2022-05-08 DIAGNOSIS — C3491 Malignant neoplasm of unspecified part of right bronchus or lung: Secondary | ICD-10-CM | POA: Diagnosis not present

## 2022-05-08 DIAGNOSIS — Z5112 Encounter for antineoplastic immunotherapy: Secondary | ICD-10-CM | POA: Diagnosis not present

## 2022-05-08 LAB — CBC WITH DIFFERENTIAL/PLATELET
Abs Immature Granulocytes: 0 10*3/uL (ref 0.00–0.07)
Basophils Absolute: 0 10*3/uL (ref 0.0–0.1)
Basophils Relative: 1 %
Eosinophils Absolute: 0.1 10*3/uL (ref 0.0–0.5)
Eosinophils Relative: 3 %
HCT: 37.6 % — ABNORMAL LOW (ref 39.0–52.0)
Hemoglobin: 12.8 g/dL — ABNORMAL LOW (ref 13.0–17.0)
Immature Granulocytes: 0 %
Lymphocytes Relative: 42 %
Lymphs Abs: 1.4 10*3/uL (ref 0.7–4.0)
MCH: 30.5 pg (ref 26.0–34.0)
MCHC: 34 g/dL (ref 30.0–36.0)
MCV: 89.7 fL (ref 80.0–100.0)
Monocytes Absolute: 0 10*3/uL — ABNORMAL LOW (ref 0.1–1.0)
Monocytes Relative: 1 %
Neutro Abs: 1.7 10*3/uL (ref 1.7–7.7)
Neutrophils Relative %: 53 %
Platelets: 110 10*3/uL — ABNORMAL LOW (ref 150–400)
RBC: 4.19 MIL/uL — ABNORMAL LOW (ref 4.22–5.81)
RDW: 12.3 % (ref 11.5–15.5)
WBC: 3.3 10*3/uL — ABNORMAL LOW (ref 4.0–10.5)
nRBC: 0 % (ref 0.0–0.2)

## 2022-05-08 LAB — COMPREHENSIVE METABOLIC PANEL
ALT: 54 U/L — ABNORMAL HIGH (ref 0–44)
AST: 30 U/L (ref 15–41)
Albumin: 3.7 g/dL (ref 3.5–5.0)
Alkaline Phosphatase: 100 U/L (ref 38–126)
Anion gap: 11 (ref 5–15)
BUN: 25 mg/dL — ABNORMAL HIGH (ref 8–23)
CO2: 19 mmol/L — ABNORMAL LOW (ref 22–32)
Calcium: 8.8 mg/dL — ABNORMAL LOW (ref 8.9–10.3)
Chloride: 103 mmol/L (ref 98–111)
Creatinine, Ser: 1.12 mg/dL (ref 0.61–1.24)
GFR, Estimated: >60 mL/min (ref >60)
Glucose, Bld: 104 mg/dL — ABNORMAL HIGH (ref 70–99)
Potassium: 3.8 mmol/L (ref 3.5–5.1)
Sodium: 133 mmol/L — ABNORMAL LOW (ref 135–145)
Total Bilirubin: 0.6 mg/dL (ref 0.3–1.2)
Total Protein: 6.9 g/dL (ref 6.5–8.1)

## 2022-05-08 LAB — MAGNESIUM: Magnesium: 2 mg/dL (ref 1.7–2.4)

## 2022-05-08 MED ORDER — SODIUM CHLORIDE 0.9 % IV SOLN: 8.0000 mg | Freq: Once | INTRAVENOUS | Status: DC

## 2022-05-08 MED ORDER — SODIUM CHLORIDE 0.9% FLUSH
10.0000 mL | INTRAVENOUS | Status: DC | PRN
  Administered 2022-05-08: 10 mL

## 2022-05-08 MED ORDER — SODIUM CHLORIDE 0.9% FLUSH
10.0000 mL | Freq: Once | INTRAVENOUS | Status: AC
  Administered 2022-05-08: 10 mL via INTRAVENOUS

## 2022-05-08 MED ORDER — SODIUM CHLORIDE 0.9 % IV SOLN
1000.0000 mg/m2 | Freq: Once | INTRAVENOUS | Status: AC
  Administered 2022-05-08: 2166 mg via INTRAVENOUS
  Filled 2022-05-08: qty 56.97

## 2022-05-08 MED ORDER — SODIUM CHLORIDE 0.9 % IV SOLN: Freq: Once | INTRAVENOUS | Status: AC

## 2022-05-08 MED ORDER — ONDANSETRON HCL 4 MG/2ML IJ SOLN
8.0000 mg | Freq: Once | INTRAMUSCULAR | Status: AC
  Administered 2022-05-08: 8 mg via INTRAVENOUS
  Filled 2022-05-08: qty 4

## 2022-05-08 MED ORDER — HEPARIN SOD (PORK) LOCK FLUSH 100 UNIT/ML IV SOLN
500.0000 [IU] | Freq: Once | INTRAVENOUS | Status: AC | PRN
  Administered 2022-05-08: 500 [IU]

## 2022-05-08 NOTE — Progress Notes (Signed)
[facility] [address] [place] [address]    Clinic Day:  05/09/2022  Referring physician: Baruch Gouty, FNP  Patient Care Team: Baruch Gouty, FNP as PCP - General (Family Medicine) Mallipeddi, Quenten Raven, MD as PCP - Cardiology (Cardiology) Derek Jack, MD as Medical Oncologist (Medical Oncology) Brien Mates, RN as Oncology Nurse Navigator (Medical Oncology)   ASSESSMENT & PLAN:   Assessment: 1.  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  Right infrahilar nodules or lymph node 1.6 cm SUV 9.25.  Small ill-defined lucent lesion within the posterior aspect of the right medial femoral condyle, FDG avid.  Lesion from metastasis or multiple myeloma less favored. - MRI brain (12/05/2021): No metastatic disease - CT super D chest (11/29/2021): Lobulated lesion in the right lower lobe measuring 2.3 x 1.8 cm.  Adjacent smoothly marginated rounded lesion in the right hilum, likely adjacent lymph node, difficult to measure. - Bronchoscopy/EBUS (11/29/2021): Normal right and left lung anatomy, no evidence of endobronchial lesion.  Enlarged right hilar adenopathy.  No subcarinal adenopathy. - Pathology (11/29/2021): Lymph node 11 R FNA-squamous cell carcinoma - [employer]ant[employer] tissue (11/30/2021): PD-L1 22 C3 TPS 80% - [employer]ant[employer] liquid biopsy (11/29/2021): BRCA2 E2720, T p53, PTEN, MSI-high not detected.  Negative for EGFR/ALK. - PET scan (03/13/2022): Central hilar component on the right side approximately 3.1 x 2.1 cm, SUV 9.  More posterior component measures 2.1 x 1.5 cm with SUV 4.5.  Mildly increased metabolic activity in the liver adjacent to the cholecystectomy bed.  No evidence of progression or metastatic disease.  - He has met with Dr. Roxan Hockey.  Neoadjuvant systemic chemoimmunotherapy was recommended followed by restaging scan. - Due  to his peripheral neuropathy, not a candidate for Taxol.  2.  Social/family history: - He lives at home with his wife.  He worked as a Ecologist prior to retirement.  He smoked 2 packs/day of cigarettes for 40 years and quit smoking cigarettes 10 years ago.  He uses high nicotine vapes. - No family history of malignancies.   3.  IgG lambda MGUS: - SPEP on 10/07/2021, M spike 0.3 g. - Free kappa light chains 23.8, ratio 1.15.  Beta-2 microglobulin 2.6.  LDH 126.  Creatinine 1.41 and calcium normal.  Hemoglobin normal. - Skeletal survey (10/25/2021): Cannot exclude new small lucent lesion in the lateral right femoral head measuring 14 mm. - PET scan (11/07/2021): Small ill-defined lucent lesion within the posterior aspect of the right medial femoral condyle which is FDG avid.  Indeterminate.  Unusual location for lung cancer and lesion of multiple myeloma is considered less favored.  Sclerotic lesion within the distal diaphysis of the right tibia with possible chondroid matrix.  This chronic benign finding was present in the MRI from May 2007.  Plan: 1.  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 - He had some nausea for which Zofran helped.  He reported slight worsening of dyspnea on exertion.  Also reported feeling unstable during movement.  Mild decrease in appetite and decrease in urinary flow reported. - Reviewed labs today which showed mildly elevated ALT of 54.  Creatinine is normal at 1.12.  Other electrolytes are normal.  CBC shows mild leukopenia and thrombocytopenia.  TSH is  minimally elevated at 4.6.  Will closely monitor. - Proceed with day 8 treatment today. - RTC 2 weeks prior to cycle 2 with labs.    No orders of the defined types were placed in this encounter.    I,Alexis Herring,acting as a Education administrator for [employer], MD.,have documented all  relevant documentation on the behalf of Derek Jack, MD,as directed by  Derek Jack, MD while in the presence of Derek Jack, MD.  I, Derek Jack MD, have reviewed the above documentation for accuracy and completeness, and I agree with the above.    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   Cancer Staging  Squamous cell lung cancer, right ([place]) Staging form: Lung, AJCC 8th Edition - Clinical stage from 12/18/2021: Stage IIB (cT1c, cN1, cM0) - Unsigned    Prior Therapy: None  Current Therapy: Neoadjuvant chemotherapy with carboplatin, gemcitabine and nivolumab  HISTORY OF PRESENT ILLNESS:   Oncology History  Squamous cell lung cancer, right ([place])  12/18/2021 Initial Diagnosis   Squamous cell lung cancer, right ([place])   05/01/2022 -  Chemotherapy   Patient is on Treatment Plan : LUNG Carboplatin D1 + Gemcitabine D1,8 + Nivolumab 380mg  q21d       INTERVAL HISTORY:   Juan Yang is a 67 y.o. male presenting to clinic today for right lung squamous cell carcinoma. He was last seen by me on 04/17/22.  He states that he has not had any falls in the last week. He reports that he often feels unsteady with walking and continues to have numbness in his feet secondary to his neuropathy. His energy level is at 30-40%. He feels that his unsteadiness is secondary to his fatigue. His appetite level is at 40%. He is using Zofran with relief of his nausea.  He states that he is having difficulty urinating at times. He notes that he had a similar issue when using Cymbalta but this was many years ago.  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   History of kidney stones    Hypertension    Peripheral neuropathy 10/29/2016   Pneumonia    Port-A-Cath in place 04/30/2022   Transient cerebral ischemic attack, unspecified 05/10/2020   stroke    Surgical History: Past Surgical History:  Procedure Laterality Date   BRONCHIAL NEEDLE ASPIRATION BIOPSY  11/29/2021   Procedure: BRONCHIAL NEEDLE ASPIRATION BIOPSIES;  Surgeon: Garner Nash, DO;  Location: [facility];  Service: Pulmonary;;   CHOLECYSTECTOMY N/A 12/13/2021   Procedure: LAPAROSCOPIC CHOLECYSTECTOMY;  Surgeon: Virl Cagey, MD;  Location: [facility];  Service: General;  Laterality: N/A;   COLONOSCOPY WITH PROPOFOL N/A 11/14/2016   Procedure: COLONOSCOPY WITH PROPOFOL;  Surgeon: Rogene Houston, MD;  Location: [facility];  Service: Endoscopy;  Laterality: N/A;  9:15   IR IMAGING GUIDED PORT INSERTION  12/24/2021   LUMBAR LAMINECTOMY/DECOMPRESSION MICRODISCECTOMY Right 05/24/2019   Procedure: LAMINECTOMY AND FORAMINOTOMY LUMBAR FIVE- SACRAL ONE RIGHT;  Surgeon: Vallarie Mare, MD;  Location: [facility];  Service: Neurosurgery;  Laterality: Right;  posterior/right   LUNG BIOPSY     MOHS SURGERY Right 05/2020   ear   MOHS SURGERY     POLYPECTOMY  11/14/2016   Procedure: POLYPECTOMY- SIGMOID COLON X3 TRANSVERSE COLON X2;  Surgeon: Rogene Houston, MD;  [facility];  Service: Endoscopy;;   rt ankle surgery     Titanium hinge   TONSILLECTOMY     TRANSFORAMINAL LUMBAR INTERBODY FUSION (TLIF) WITH PEDICLE SCREW FIXATION 1 LEVEL N/A 05/02/2020   Procedure: Lumbar five Sacral one Transforaminal lumbar interbody fusion;  Surgeon: Vallarie Mare, MD;  Location: [facility];  Service: Neurosurgery;  Laterality: N/A;   VIDEO BRONCHOSCOPY WITH  ENDOBRONCHIAL ULTRASOUND N/A 11/29/2021   Procedure: VIDEO BRONCHOSCOPY WITH ENDOBRONCHIAL ULTRASOUND;  Surgeon: Garner Nash, DO;  Location: [facility];  Service: Pulmonary;  Laterality: N/A;    Social History: Social History   Socioeconomic History   Marital status: Married    Spouse name: Not on file   Number of children: Not on file   Years of education: Not on file   Highest education level: Not on file  Occupational History   Not on file  Tobacco Use   Smoking status: Former    Packs/day: 1.50    Years: 20.00    Additional pack years: 0.00    Total pack years: 30.00    Types: Cigarettes    Quit date: 11/12/2012    Years since quitting: 9.4    Passive exposure: Never   Smokeless tobacco: Never   Tobacco comments:    Pt smokes 1 pack a month and vapes CBD oil. ARJ 11/22/21  Vaping Use   Vaping Use: Former   Quit date: 12/18/2021   Substances: CBD  Substance and Sexual Activity   Alcohol use: No   Drug use: No   Sexual activity: Yes    Birth control/protection: None  Other Topics Concern   Not on file  Social History Narrative   Right handed   Lives with wife in one story home   Social Determinants of Health   Financial Resource Strain: Not on file  Food Insecurity: No Food Insecurity (12/19/2021)   Hunger Vital Sign    Worried About Running Out of Food in the Last Year: Never true    Ran Out of Food in the Last Year: Never true  Transportation Needs: No Transportation Needs (12/19/2021)   PRAPARE - Hydrologist (Medical): No    Lack of Transportation (Non-Medical): No  Physical Activity: Inactive (05/30/2020)   Exercise Vital Sign    Days of Exercise per Week: 0 days    Minutes of Exercise per Session: 0 min  Stress: Not on file  Social Connections: Not on file  Intimate Partner Violence: Not At Risk (12/18/2021)   Humiliation, Afraid, Rape, and Kick questionnaire    Fear of Current or Ex-Partner: No    Emotionally Abused:  No    Physically Abused: No    Sexually Abused: No    Family History: Family History  Problem Relation Age of Onset   Charcot-Marie-Tooth disease Mother    Heart disease Father    COPD Father    Arthritis Sister     Current Medications:  Current Outpatient Medications:    amLODipine (NORVASC)  2.5 MG tablet, Take 2.5 mg by mouth 2 (two) times daily., Disp: , Rfl:    aspirin EC (ADULT ASPIRIN REGIMEN) 81 MG tablet, Take 81-325 mg by mouth daily., Disp: , Rfl:    CARBOPLATIN IV, Inject into the vein every 21 ( twenty-one) days. Day 1, Day 8 q 21 days, Disp: , Rfl:    cyclobenzaprine (FLEXERIL) 10 MG tablet, Take 1 tablet (10 mg total) by mouth 3 (three) times daily as needed for muscle spasms., Disp: 30 tablet, Rfl: 0   furosemide (LASIX) 40 MG tablet, Take 1 tablet (40 mg total) by mouth daily., Disp: 30 tablet, Rfl: 3   GEMCITABINE HCL IV, Inject into the vein once a week. Day 1, Day 8 q 21 days, Disp: , Rfl:    Glucosamine HCl-MSM (GLUCOSAMINE-MSM PO), Take 1 tablet by mouth daily., Disp: , Rfl:    losartan (COZAAR) 25 MG tablet, Take 25 mg by mouth daily., Disp: , Rfl:    metoprolol succinate (TOPROL-XL) 25 MG 24 hr tablet, Take 1 tablet (25 mg total) by mouth daily., Disp: 90 tablet, Rfl: 3   naproxen sodium (ALEVE) 220 MG tablet, Take 220 mg by mouth daily as needed (pain)., Disp: , Rfl:    Nivolumab (OPDIVO IV), Inject into the vein every 21 ( twenty-one) days., Disp: , Rfl:    oxyCODONE (OXY IR/ROXICODONE) 5 MG immediate release tablet, Take 1-2 tablets (5-10 mg total) by mouth every 4 (four) hours as needed for breakthrough pain or severe pain., Disp: 20 tablet, Rfl: 0   Potassium 99 MG TABS, Take 99 mg by mouth daily as needed (low potassium)., Disp: , Rfl:    sildenafil (VIAGRA) 100 MG tablet, Take 100 mg by mouth daily as needed for erectile dysfunction., Disp: , Rfl:    sodium bicarbonate 650 MG tablet, Take 650 mg by mouth 2 (two) times daily., Disp: , Rfl:     lidocaine-prilocaine (EMLA) cream, Apply a quarter-sized amount to port a cath site and cover with plastic wrap 1 hour prior to infusion appointments (Patient not taking: Reported on 05/08/2022), Disp: 30 g, Rfl: 3   prochlorperazine (COMPAZINE) 10 MG tablet, Take 1 tablet (10 mg total) by mouth every 6 (six) hours as needed for nausea or vomiting. (Patient not taking: Reported on 05/08/2022), Disp: 60 tablet, Rfl: 3   Allergies: Allergies  Allergen Reactions   Other Hives    DUCK EGGS ONLY   Atenolol Diarrhea and Nausea Only    Pt unsure of allergy? Possible fainting?   Cymbalta [Duloxetine Hcl] Other (See Comments)    Pt states it shut his bladder down and could not urinate; states it made him lethargic as well.   Latex Itching   Penicillins Hives    Did it involve swelling of the face/tongue/throat, SOB, or low BP? Unknown Did it involve sudden or severe rash/hives, skin peeling, or any reaction on the inside of your mouth or nose? Unknown Did you need to seek medical attention at a hospital or doctor's office? yes When did it last happen?      Childhood If all above answers are "NO", may proceed with cephalosporin use.    REVIEW OF SYSTEMS:   Review of Systems  Constitutional:  Positive for fatigue. Negative for chills and fever.  HENT:   Negative for lump/mass, mouth sores, nosebleeds, sore throat and trouble swallowing.   Eyes:  Negative for eye problems.  Respiratory:  Positive for shortness of breath. Negative for cough.   Cardiovascular:  Positive for palpitations. Negative for chest pain and leg swelling.  Gastrointestinal:  Positive for constipation and nausea. Negative for abdominal pain, diarrhea and vomiting.  Genitourinary:  Positive for difficulty urinating and hematuria. Negative for bladder incontinence, dysuria, frequency and nocturia.   Musculoskeletal:  Positive for back pain (4/10 in severity). Negative for arthralgias, flank pain, myalgias and neck pain.  Skin:   Negative for itching and rash.  Neurological:  Positive for dizziness and numbness (chronic neuropathy). Negative for headaches.       + feeling unsteady with walking  Hematological:  Does not bruise/bleed easily.  Psychiatric/Behavioral:  Positive for sleep disturbance. Negative for depression and suicidal ideas. The patient is nervous/anxious.   All other systems reviewed and are negative.    VITALS:   There were no vitals taken for this visit.  Wt Readings from Last 3 Encounters:  05/08/22 203 lb 9.6 oz (92.4 kg)  05/01/22 207 lb 8 oz (94.1 kg)  04/17/22 199 lb 11.2 oz (90.6 kg)    There is no height or weight on file to calculate BMI.  Performance status (ECOG): 1 - Symptomatic but completely ambulatory  PHYSICAL EXAM:   Physical Exam Vitals and nursing note reviewed. Exam conducted with a chaperone present.  Constitutional:      Appearance: Normal appearance.  Cardiovascular:     Rate and Rhythm: Normal rate and regular rhythm.     Pulses: Normal pulses.     Heart sounds: Normal heart sounds.  Pulmonary:     Effort: Pulmonary effort is normal.     Breath sounds: Normal breath sounds.  Abdominal:     Palpations: Abdomen is soft. There is no hepatomegaly, splenomegaly or mass.     Tenderness: There is no abdominal tenderness.  Musculoskeletal:     Right lower leg: No edema.     Left lower leg: No edema.  Lymphadenopathy:     Cervical: No cervical adenopathy.     Right cervical: No superficial, deep or posterior cervical adenopathy.    Left cervical: No superficial, deep or posterior cervical adenopathy.     Upper Body:     Right upper body: No supraclavicular or axillary adenopathy.     Left upper body: No supraclavicular or axillary adenopathy.  Neurological:     General: No focal deficit present.     Mental Status: He is alert and oriented to person, place, and time.  Psychiatric:        Mood and Affect: Mood normal.        Behavior: Behavior normal.      LABS:      Latest Ref Rng & Units 05/08/2022    8:41 AM 04/30/2022    1:42 PM 02/19/2022    5:46 AM  CBC  WBC 4.0 - 10.5 K/uL 3.3  7.6  11.2   Hemoglobin 13.0 - 17.0 g/dL 12.8  13.7  14.5   Hematocrit 39.0 - 52.0 % 37.6  40.1  43.5   Platelets 150 - 400 K/uL 110  251  225       Latest Ref Rng & Units 05/08/2022    8:41 AM 04/30/2022    1:42 PM 02/18/2022    5:36 AM  CMP  Glucose 70 - 99 mg/dL 104  97  103   BUN 8 - 23 mg/dL 25  16  14    Creatinine 0.61 - 1.24 mg/dL 1.12  1.30  1.10   Sodium 135 - 145 mmol/L 133  134  135  Potassium 3.5 - 5.1 mmol/L 3.8  3.8  3.9   Chloride 98 - 111 mmol/L 103  103  105   CO2 22 - 32 mmol/L 19  23  22    Calcium 8.9 - 10.3 mg/dL 8.8  8.5  8.8   Total Protein 6.5 - 8.1 g/dL 6.9  6.9    Total Bilirubin 0.3 - 1.2 mg/dL 0.6  0.5    Alkaline Phos 38 - 126 U/L 100  98    AST 15 - 41 U/L 30  20    ALT 0 - 44 U/L 54  14       No results found for: "CEA1", "CEA" / No results found for: "CEA1", "CEA" No results found for: "PSA1" No results found for: "[id number]" No results found for: "CAN125"  Lab Results  Component Value Date   TOTALPROTELP 6.9 10/07/2021   TOTALPROTELP 6.9 10/07/2021   ALBUMINELP 3.6 10/07/2021   A1GS 0.2 10/07/2021   A2GS 0.8 10/07/2021   BETS 1.2 10/07/2021   BETA2SER 0.4 04/11/2020   GAMS 1.1 10/07/2021   MSPIKE 0.3 (H) 10/07/2021   SPEI Comment 10/07/2021   No results found for: "TIBC", "FERRITIN", "IRONPCTSAT" Lab Results  Component Value Date   LDH 126 10/07/2021   LDH 162 04/11/2021   LDH 142 05/30/2020     STUDIES:   NM Myocar Multi W/Spect W/Wall Motion / EF  Result Date: 04/23/2022   Stress ECG is negative for ischemia   LV perfusion is normal. There is no evidence of ischemia. There is no evidence of infarction.   Left ventricular function is normal. Nuclear stress EF: 64 %.   The study is normal. The study is low risk.   MR LIVER W WO CONTRAST  Result Date: 04/21/2022 CLINICAL DATA:  Follow-up  liver lesion seen on prior PET-CT. EXAM: MRI ABDOMEN WITHOUT AND WITH CONTRAST TECHNIQUE: Multiplanar multisequence MR imaging of the abdomen was performed both before and after the administration of intravenous contrast. CONTRAST:  56mL GADAVIST GADOBUTROL 1 MMOL/ML IV SOLN COMPARISON:  Multiple priors including PET-CT March 13, 2022. FINDINGS: Lower chest: Right hilar soft tissue nodularity on image 20/3 corresponds with the patient's known bronchogenic neoplasm. Hepatobiliary: Diffuse hepatic steatosis with focal fatty sparing along the gallbladder fossa. No suspicious hepatic lesion. Multifocal wedge-shaped rim of hepatic arterial enhancement, for instance on image 25/13 which equilibrates to background liver on delayed postcontrast pulse sequences. Gallbladder surgically absent no biliary ductal dilation Pancreas: No pancreatic ductal dilation or evidence of acute inflammation Spleen:  No splenomegaly. Adrenals/Urinary Tract: Bilateral adrenal glands appear normal. No hydronephrosis. Bilateral nonenhancing fluid signal lesions compatible with cysts and lesions technically too small to accurately characterize but statistically likely to reflect cysts, considered benign and requiring no independent imaging follow-up Stomach/Bowel: Visualized portions within the abdomen are unremarkable. Vascular/Lymphatic: Abdominal aortic aneurysm measures 3.1 cm, unchanged from prior. No pathologically enlarged abdominal lymph nodes. Portal, splenic and superior mesenteric veins are patent. Other:  No significant abdominal free fluid Musculoskeletal: No suspicious bone lesions identified. Lumbosacral fixation hardware. IMPRESSION: 1. Diffuse hepatic steatosis with focal fatty sparing along the gallbladder fossa. No suspicious hepatic lesion. 2. Multifocal wedge-shaped rim of hepatic arterial enhancement which equilibrates to background liver on delayed postcontrast pulse sequences. Findings are compatible with transient  hepatic intensity difference. 3. Abdominal aortic aneurysm measures 3.1 cm, unchanged from prior. Recommend follow-up ultrasound every 3 years. This recommendation follows ACR consensus guidelines: White Paper of the ACR Incidental Findings Committee II on Vascular  Findings. J Am Coll Radiol 2013; 10:789-794. 4. Right hilar soft tissue nodularity corresponds with the patient's known bronchogenic neoplasm. Electronically Signed   By: Dahlia Bailiff M.D.   On: 04/21/2022 08:18

## 2022-05-08 NOTE — Progress Notes (Signed)
Patient presents today for treatment (Gemzar D8) and follow up visit with Dr. Delton Coombes. Vital signs reviewed by MD. Labs reviewed and within parameters for treatment.   Message received from A. Ouida Sills RN / Dr. Delton Coombes to proceed with treatment.   Gemzar given today per MD orders. Tolerated infusion without adverse affects. Vital signs stable. No complaints at this time. Discharged from clinic ambulatory in stable condition. Alert and oriented x 3. F/U with [facility] as scheduled.

## 2022-05-08 NOTE — Patient Instructions (Signed)
[facility]  Discharge Instructions: Thank you for choosing [facility] to provide your oncology and hematology care.  If you have a lab appointment with the [facility], please come in thru the Main Entrance and check in at the main information desk.  Wear comfortable clothing and clothing appropriate for easy access to any Portacath or PICC line.   We strive to give you quality time with your provider. You may need to reschedule your appointment if you arrive late (15 or more minutes).  Arriving late affects you and other patients whose appointments are after yours.  Also, if you miss three or more appointments without notifying the office, you may be dismissed from the clinic at the provider's discretion.      For prescription refill requests, have your pharmacy contact our office and allow 72 hours for refills to be completed.    Today you received the following chemotherapy and/or immunotherapy agents Gemzar. Gemcitabine Injection What is this medication? GEMCITABINE (jem SYE ta been) treats some types of cancer. It works by slowing down the growth of cancer cells. This medicine may be used for other purposes; ask your health care provider or pharmacist if you have questions. COMMON BRAND NAME(S): Gemzar, Infugem What should I tell my care team before I take this medication? They need to know if you have any of these conditions: Blood disorders Infection Kidney disease Liver disease Lung or breathing disease, such as asthma or COPD Recent or ongoing radiation therapy An unusual or allergic reaction to gemcitabine, other medications, foods, dyes, or preservatives If you or your partner are pregnant or trying to get pregnant Breast-feeding How should I use this medication? This medication is injected into a vein. It is given by your care team in a hospital or clinic setting. Talk to your care team about the use of this medication in children.  Special care may be needed. Overdosage: If you think you have taken too much of this medicine contact a poison control center or emergency room at once. NOTE: This medicine is only for you. Do not share this medicine with others. What if I miss a dose? Keep appointments for follow-up doses. It is important not to miss your dose. Call your care team if you are unable to keep an appointment. What may interact with this medication? Interactions have not been studied. This list may not describe all possible interactions. Give your health care provider a list of all the medicines, herbs, non-prescription drugs, or dietary supplements you use. Also tell them if you smoke, drink alcohol, or use illegal drugs. Some items may interact with your medicine. What should I watch for while using this medication? Your condition will be monitored carefully while you are receiving this medication. This medication may make you feel generally unwell. This is not uncommon, as chemotherapy can affect healthy cells as well as cancer cells. Report any side effects. Continue your course of treatment even though you feel ill unless your care team tells you to stop. In some cases, you may be given additional medications to help with side effects. Follow all directions for their use. This medication may increase your risk of getting an infection. Call your care team for advice if you get a fever, chills, sore throat, or other symptoms of a cold or flu. Do not treat yourself. Try to avoid being around people who are sick. This medication may increase your risk to bruise or bleed. Call your care team  if you notice any unusual bleeding. Be careful brushing or flossing your teeth or using a toothpick because you may get an infection or bleed more easily. If you have any dental work done, tell your dentist you are receiving this medication. Avoid taking medications that contain aspirin, acetaminophen, ibuprofen, naproxen, or  ketoprofen unless instructed by your care team. These medications may hide a fever. Talk to your care team if you or your partner wish to become pregnant or think you might be pregnant. This medication can cause serious birth defects if taken during pregnancy and for 6 months after the last dose. A negative pregnancy test is required before starting this medication. A reliable form of contraception is recommended while taking this medication and for 6 months after the last dose. Talk to your care team about effective forms of contraception. Do not father a child while taking this medication and for 3 months after the last dose. Use a condom while having sex during this time period. Do not breastfeed while taking this medication and for at least 1 week after the last dose. This medication may cause infertility. Talk to your care team if you are concerned about your fertility. What side effects may I notice from receiving this medication? Side effects that you should report to your care team as soon as possible: Allergic reactions--skin rash, itching, hives, swelling of the face, lips, tongue, or throat Capillary leak syndrome--stomach or muscle pain, unusual weakness or fatigue, feeling faint or lightheaded, decrease in the amount of urine, swelling of the ankles, hands, or feet, trouble breathing Infection--fever, chills, cough, sore throat, wounds that don't heal, pain or trouble when passing urine, general feeling of discomfort or being unwell Liver injury--right upper belly pain, loss of appetite, nausea, light-colored stool, dark yellow or brown urine, yellowing skin or eyes, unusual weakness or fatigue Low red blood cell level--unusual weakness or fatigue, dizziness, headache, trouble breathing Lung injury--shortness of breath or trouble breathing, cough, spitting up blood, chest pain, fever Stomach pain, bloody diarrhea, pale skin, unusual weakness or fatigue, decrease in the amount of urine, which  may be signs of hemolytic uremic syndrome Sudden and severe headache, confusion, change in vision, seizures, which may be signs of posterior reversible encephalopathy syndrome (PRES) Unusual bruising or bleeding Side effects that usually do not require medical attention (report to your care team if they continue or are bothersome): Diarrhea Drowsiness Hair loss Nausea Pain, redness, or swelling with sores inside the mouth or throat Vomiting This list may not describe all possible side effects. Call your doctor for medical advice about side effects. You may report side effects to FDA at 1-800-FDA-1088. Where should I keep my medication? This medication is given in a hospital or clinic. It will not be stored at home. NOTE: This sheet is a summary. It may not cover all possible information. If you have questions about this medicine, talk to your doctor, pharmacist, or health care provider.  2023 Elsevier/Gold Standard (2021-06-04 00:00:00)       To help prevent nausea and vomiting after your treatment, we encourage you to take your nausea medication as directed.  BELOW ARE SYMPTOMS THAT SHOULD BE REPORTED IMMEDIATELY: *FEVER GREATER THAN 100.4 F (38 C) OR HIGHER *CHILLS OR SWEATING *NAUSEA AND VOMITING THAT IS NOT CONTROLLED WITH YOUR NAUSEA MEDICATION *UNUSUAL SHORTNESS OF BREATH *UNUSUAL BRUISING OR BLEEDING *URINARY PROBLEMS (pain or burning when urinating, or frequent urination) *BOWEL PROBLEMS (unusual diarrhea, constipation, pain near the anus) TENDERNESS IN MOUTH AND  THROAT WITH OR WITHOUT PRESENCE OF ULCERS (sore throat, sores in mouth, or a toothache) UNUSUAL RASH, SWELLING OR PAIN  UNUSUAL VAGINAL DISCHARGE OR ITCHING   Items with * indicate a potential emergency and should be followed up as soon as possible or go to the Emergency Department if any problems should occur.  Please show the CHEMOTHERAPY ALERT CARD or IMMUNOTHERAPY ALERT CARD at check-in to the Emergency  Department and triage nurse.  Should you have questions after your visit or need to cancel or reschedule your appointment, please contact [facility] [phone]  and follow the prompts.  Office hours are 8:00 a.m. to 4:30 p.m. Monday - Friday. Please note that voicemails left after 4:00 p.m. may not be returned until the following business day.  We are closed weekends and major holidays. You have access to a nurse at all times for urgent questions. Please call the main number to the clinic [phone] and follow the prompts.  For any non-urgent questions, you may also contact your provider using MyChart. We now offer e-Visits for anyone 14 and older to request care online for non-urgent symptoms. For details visit [email or web address].   Also download the MyChart app! Go to the app store, search "MyChart", open the app, select [facility], and log in with your MyChart username and password.

## 2022-05-08 NOTE — Patient Instructions (Signed)
[facility] at [facility] Discharge Instructions   You were seen and examined today by Dr. Katragadda.  He reviewed the results of your lab work which are normal/stable.   We will proceed with your treatment today.  Return as scheduled.    Thank you for choosing [facility] at [facility] to provide your oncology and hematology care.  To afford each patient quality time with our provider, please arrive at least 15 minutes before your scheduled appointment time.   If you have a lab appointment with the Cancer Center please come in thru the Main Entrance and check in at the main information desk.  You need to re-schedule your appointment should you arrive 10 or more minutes late.  We strive to give you quality time with our providers, and arriving late affects you and other patients whose appointments are after yours.  Also, if you no show three or more times for appointments you may be dismissed from the clinic at the providers discretion.     Again, thank you for choosing [facility].  Our hope is that these requests will decrease the amount of time that you wait before being seen by our physicians.       _____________________________________________________________  Should you have questions after your visit to [facility], please contact our office at [phone] and follow the prompts.  Our office hours are 8:00 a.m. and 4:30 p.m. Monday - Friday.  Please note that voicemails left after 4:00 p.m. may not be returned until the following business day.  We are closed weekends and major holidays.  You do have access to a nurse 24-7, just call the main number to the clinic [phone] and do not press any options, hold on the line and a nurse will answer the phone.    For prescription refill requests, have your pharmacy contact our office and allow 72 hours.    Due to Covid, you will need to wear a mask upon entering  the hospital. If you do not have a mask, a mask will be given to you at the Main Entrance upon arrival. For doctor visits, patients may have 1 support person age 18 or older with them. For treatment visits, patients can not have anyone with them due to social distancing guidelines and our immunocompromised population.      

## 2022-05-09 ENCOUNTER — Ambulatory Visit: Payer: PPO | Admitting: Internal Medicine

## 2022-05-09 ENCOUNTER — Encounter: Payer: Self-pay | Admitting: Hematology

## 2022-05-12 ENCOUNTER — Inpatient Hospital Stay (HOSPITAL_COMMUNITY)
Admission: EM | Admit: 2022-05-12 | Discharge: 2022-06-11 | DRG: 871 | Disposition: E | Payer: PPO | Attending: Pulmonary Disease | Admitting: Pulmonary Disease

## 2022-05-12 ENCOUNTER — Emergency Department (HOSPITAL_COMMUNITY): Payer: PPO

## 2022-05-12 ENCOUNTER — Encounter (HOSPITAL_COMMUNITY): Payer: Self-pay

## 2022-05-12 ENCOUNTER — Other Ambulatory Visit: Payer: Self-pay

## 2022-05-12 ENCOUNTER — Telehealth: Payer: Self-pay | Admitting: *Deleted

## 2022-05-12 DIAGNOSIS — D472 Monoclonal gammopathy: Secondary | ICD-10-CM | POA: Diagnosis not present

## 2022-05-12 DIAGNOSIS — I714 Abdominal aortic aneurysm, without rupture, unspecified: Secondary | ICD-10-CM | POA: Diagnosis not present

## 2022-05-12 DIAGNOSIS — F17210 Nicotine dependence, cigarettes, uncomplicated: Secondary | ICD-10-CM | POA: Diagnosis not present

## 2022-05-12 DIAGNOSIS — N170 Acute kidney failure with tubular necrosis: Secondary | ICD-10-CM | POA: Diagnosis not present

## 2022-05-12 DIAGNOSIS — K631 Perforation of intestine (nontraumatic): Secondary | ICD-10-CM | POA: Diagnosis not present

## 2022-05-12 DIAGNOSIS — I129 Hypertensive chronic kidney disease with stage 1 through stage 4 chronic kidney disease, or unspecified chronic kidney disease: Secondary | ICD-10-CM | POA: Diagnosis present

## 2022-05-12 DIAGNOSIS — K7200 Acute and subacute hepatic failure without coma: Secondary | ICD-10-CM | POA: Diagnosis not present

## 2022-05-12 DIAGNOSIS — Z515 Encounter for palliative care: Secondary | ICD-10-CM | POA: Diagnosis not present

## 2022-05-12 DIAGNOSIS — Z888 Allergy status to other drugs, medicaments and biological substances status: Secondary | ICD-10-CM

## 2022-05-12 DIAGNOSIS — E871 Hypo-osmolality and hyponatremia: Secondary | ICD-10-CM | POA: Diagnosis not present

## 2022-05-12 DIAGNOSIS — Z7982 Long term (current) use of aspirin: Secondary | ICD-10-CM

## 2022-05-12 DIAGNOSIS — F419 Anxiety disorder, unspecified: Secondary | ICD-10-CM | POA: Diagnosis not present

## 2022-05-12 DIAGNOSIS — R109 Unspecified abdominal pain: Principal | ICD-10-CM

## 2022-05-12 DIAGNOSIS — E872 Acidosis, unspecified: Secondary | ICD-10-CM | POA: Diagnosis not present

## 2022-05-12 DIAGNOSIS — Z79899 Other long term (current) drug therapy: Secondary | ICD-10-CM

## 2022-05-12 DIAGNOSIS — Z87442 Personal history of urinary calculi: Secondary | ICD-10-CM

## 2022-05-12 DIAGNOSIS — Z66 Do not resuscitate: Secondary | ICD-10-CM | POA: Diagnosis not present

## 2022-05-12 DIAGNOSIS — Z85828 Personal history of other malignant neoplasm of skin: Secondary | ICD-10-CM

## 2022-05-12 DIAGNOSIS — Z95828 Presence of other vascular implants and grafts: Secondary | ICD-10-CM

## 2022-05-12 DIAGNOSIS — A419 Sepsis, unspecified organism: Principal | ICD-10-CM

## 2022-05-12 DIAGNOSIS — N183 Chronic kidney disease, stage 3 unspecified: Secondary | ICD-10-CM | POA: Diagnosis present

## 2022-05-12 DIAGNOSIS — D696 Thrombocytopenia, unspecified: Secondary | ICD-10-CM | POA: Insufficient documentation

## 2022-05-12 DIAGNOSIS — Z9221 Personal history of antineoplastic chemotherapy: Secondary | ICD-10-CM

## 2022-05-12 DIAGNOSIS — C3491 Malignant neoplasm of unspecified part of right bronchus or lung: Secondary | ICD-10-CM | POA: Diagnosis present

## 2022-05-12 DIAGNOSIS — G600 Hereditary motor and sensory neuropathy: Secondary | ICD-10-CM | POA: Diagnosis present

## 2022-05-12 DIAGNOSIS — G709 Myoneural disorder, unspecified: Secondary | ICD-10-CM | POA: Diagnosis present

## 2022-05-12 DIAGNOSIS — R54 Age-related physical debility: Secondary | ICD-10-CM | POA: Diagnosis present

## 2022-05-12 DIAGNOSIS — N179 Acute kidney failure, unspecified: Secondary | ICD-10-CM | POA: Insufficient documentation

## 2022-05-12 DIAGNOSIS — Z9911 Dependence on respirator [ventilator] status: Secondary | ICD-10-CM

## 2022-05-12 DIAGNOSIS — Z8249 Family history of ischemic heart disease and other diseases of the circulatory system: Secondary | ICD-10-CM

## 2022-05-12 DIAGNOSIS — D61810 Antineoplastic chemotherapy induced pancytopenia: Secondary | ICD-10-CM | POA: Diagnosis present

## 2022-05-12 DIAGNOSIS — Z8673 Personal history of transient ischemic attack (TIA), and cerebral infarction without residual deficits: Secondary | ICD-10-CM

## 2022-05-12 DIAGNOSIS — Z8261 Family history of arthritis: Secondary | ICD-10-CM

## 2022-05-12 DIAGNOSIS — J9601 Acute respiratory failure with hypoxia: Secondary | ICD-10-CM | POA: Diagnosis not present

## 2022-05-12 DIAGNOSIS — D709 Neutropenia, unspecified: Secondary | ICD-10-CM | POA: Insufficient documentation

## 2022-05-12 DIAGNOSIS — K5900 Constipation, unspecified: Secondary | ICD-10-CM | POA: Diagnosis not present

## 2022-05-12 DIAGNOSIS — T451X5A Adverse effect of antineoplastic and immunosuppressive drugs, initial encounter: Secondary | ICD-10-CM | POA: Diagnosis not present

## 2022-05-12 DIAGNOSIS — R6521 Severe sepsis with septic shock: Secondary | ICD-10-CM | POA: Diagnosis not present

## 2022-05-12 DIAGNOSIS — Z880 Allergy status to penicillin: Secondary | ICD-10-CM

## 2022-05-12 DIAGNOSIS — Z825 Family history of asthma and other chronic lower respiratory diseases: Secondary | ICD-10-CM

## 2022-05-12 DIAGNOSIS — G629 Polyneuropathy, unspecified: Secondary | ICD-10-CM | POA: Diagnosis present

## 2022-05-12 DIAGNOSIS — Z6827 Body mass index (BMI) 27.0-27.9, adult: Secondary | ICD-10-CM

## 2022-05-12 DIAGNOSIS — Z91040 Latex allergy status: Secondary | ICD-10-CM

## 2022-05-12 LAB — TYPE AND SCREEN
ABO/RH(D): B NEG
Antibody Screen: NEGATIVE

## 2022-05-12 LAB — COMPREHENSIVE METABOLIC PANEL
ALT: 49 U/L — ABNORMAL HIGH (ref 0–44)
AST: 42 U/L — ABNORMAL HIGH (ref 15–41)
Albumin: 3.3 g/dL — ABNORMAL LOW (ref 3.5–5.0)
Alkaline Phosphatase: 100 U/L (ref 38–126)
Anion gap: 10 (ref 5–15)
BUN: 35 mg/dL — ABNORMAL HIGH (ref 8–23)
CO2: 20 mmol/L — ABNORMAL LOW (ref 22–32)
Calcium: 8.5 mg/dL — ABNORMAL LOW (ref 8.9–10.3)
Chloride: 98 mmol/L (ref 98–111)
Creatinine, Ser: 1.89 mg/dL — ABNORMAL HIGH (ref 0.61–1.24)
GFR, Estimated: 39 mL/min — ABNORMAL LOW (ref >60)
Glucose, Bld: 143 mg/dL — ABNORMAL HIGH (ref 70–99)
Potassium: 4.8 mmol/L (ref 3.5–5.1)
Sodium: 128 mmol/L — ABNORMAL LOW (ref 135–145)
Total Bilirubin: 3.2 mg/dL — ABNORMAL HIGH (ref 0.3–1.2)
Total Protein: 6.8 g/dL (ref 6.5–8.1)

## 2022-05-12 LAB — I-STAT CHEM 8, ED
BUN: 32 mg/dL — ABNORMAL HIGH (ref 8–23)
Calcium, Ion: 1.17 mmol/L (ref 1.15–1.40)
Chloride: 100 mmol/L (ref 98–111)
Creatinine, Ser: 2 mg/dL — ABNORMAL HIGH (ref 0.61–1.24)
Glucose, Bld: 124 mg/dL — ABNORMAL HIGH (ref 70–99)
HCT: 35 % — ABNORMAL LOW (ref 39.0–52.0)
Hemoglobin: 11.9 g/dL — ABNORMAL LOW (ref 13.0–17.0)
Potassium: 4.6 mmol/L (ref 3.5–5.1)
Sodium: 133 mmol/L — ABNORMAL LOW (ref 135–145)
TCO2: 18 mmol/L — ABNORMAL LOW (ref 22–32)

## 2022-05-12 LAB — CBC WITH DIFFERENTIAL/PLATELET
Abs Immature Granulocytes: 0 10*3/uL (ref 0.00–0.07)
Basophils Absolute: 0 10*3/uL (ref 0.0–0.1)
Basophils Relative: 0 %
Eosinophils Absolute: 0 10*3/uL (ref 0.0–0.5)
Eosinophils Relative: 0 %
HCT: 36 % — ABNORMAL LOW (ref 39.0–52.0)
Hemoglobin: 12.5 g/dL — ABNORMAL LOW (ref 13.0–17.0)
Immature Granulocytes: 0 %
Lymphocytes Relative: 60 %
Lymphs Abs: 0.1 10*3/uL — ABNORMAL LOW (ref 0.7–4.0)
MCH: 30.5 pg (ref 26.0–34.0)
MCHC: 34.7 g/dL (ref 30.0–36.0)
MCV: 87.8 fL (ref 80.0–100.0)
Monocytes Absolute: 0 10*3/uL — ABNORMAL LOW (ref 0.1–1.0)
Monocytes Relative: 0 %
Neutro Abs: 0.1 10*3/uL — CL (ref 1.7–7.7)
Neutrophils Relative %: 40 %
Platelets: 23 10*3/uL — CL (ref 150–400)
RBC: 4.1 MIL/uL — ABNORMAL LOW (ref 4.22–5.81)
RDW: 12.2 % (ref 11.5–15.5)
WBC: 0.2 10*3/uL — CL (ref 4.0–10.5)
nRBC: 0 % (ref 0.0–0.2)

## 2022-05-12 LAB — CBG MONITORING, ED: Glucose-Capillary: 134 mg/dL — ABNORMAL HIGH (ref 70–99)

## 2022-05-12 LAB — PROTIME-INR
INR: 1.5 — ABNORMAL HIGH (ref 0.8–1.2)
Prothrombin Time: 17.6 seconds — ABNORMAL HIGH (ref 11.4–15.2)

## 2022-05-12 LAB — LIPASE, BLOOD: Lipase: 22 U/L (ref 11–51)

## 2022-05-12 LAB — TROPONIN I (HIGH SENSITIVITY)
Troponin I (High Sensitivity): 10 ng/L (ref <18)
Troponin I (High Sensitivity): 10 ng/L (ref <18)

## 2022-05-12 LAB — LACTIC ACID, PLASMA: Lactic Acid, Venous: 6.7 mmol/L (ref 0.5–1.9)

## 2022-05-12 LAB — GLUCOSE, CAPILLARY: Glucose-Capillary: 123 mg/dL — ABNORMAL HIGH (ref 70–99)

## 2022-05-12 MED ORDER — SODIUM CHLORIDE 0.9 % IV SOLN
2.0000 g | Freq: Once | INTRAVENOUS | Status: AC
  Administered 2022-05-12: 2 g via INTRAVENOUS
  Filled 2022-05-12: qty 12.5

## 2022-05-12 MED ORDER — ALBUMIN HUMAN 25 % IV SOLN
25.0000 g | Freq: Once | INTRAVENOUS | Status: AC
  Administered 2022-05-12: 25 g via INTRAVENOUS
  Filled 2022-05-12: qty 100

## 2022-05-12 MED ORDER — NOREPINEPHRINE 4 MG/250ML-% IV SOLN
INTRAVENOUS | Status: AC
  Filled 2022-05-12: qty 250

## 2022-05-12 MED ORDER — SODIUM CHLORIDE 0.9 % IV SOLN: 250.0000 mL | INTRAVENOUS | Status: DC

## 2022-05-12 MED ORDER — HYDROMORPHONE HCL 1 MG/ML IJ SOLN: 2.0000 mg | INTRAMUSCULAR | Status: DC | PRN

## 2022-05-12 MED ORDER — METRONIDAZOLE 500 MG/100ML IV SOLN
500.0000 mg | Freq: Two times a day (BID) | INTRAVENOUS | Status: DC
  Administered 2022-05-13: 500 mg via INTRAVENOUS
  Filled 2022-05-12 (×2): qty 100

## 2022-05-12 MED ORDER — LACTATED RINGERS IV BOLUS
1000.0000 mL | Freq: Once | INTRAVENOUS | Status: AC
  Administered 2022-05-12: 1000 mL via INTRAVENOUS

## 2022-05-12 MED ORDER — VANCOMYCIN VARIABLE DOSE PER UNSTABLE RENAL FUNCTION (PHARMACIST DOSING): Status: DC

## 2022-05-12 MED ORDER — FLUCONAZOLE IN SODIUM CHLORIDE 400-0.9 MG/200ML-% IV SOLN
400.0000 mg | INTRAVENOUS | Status: DC
  Filled 2022-05-12: qty 200

## 2022-05-12 MED ORDER — ORAL CARE MOUTH RINSE: 15.0000 mL | OROMUCOSAL | Status: DC | PRN

## 2022-05-12 MED ORDER — VANCOMYCIN HCL 1750 MG/350ML IV SOLN
1750.0000 mg | INTRAVENOUS | Status: AC
  Administered 2022-05-12: 1750 mg via INTRAVENOUS
  Filled 2022-05-12: qty 350

## 2022-05-12 MED ORDER — SODIUM CHLORIDE 0.9 % IV SOLN
2.0000 g | Freq: Two times a day (BID) | INTRAVENOUS | Status: DC
  Administered 2022-05-13: 2 g via INTRAVENOUS
  Filled 2022-05-12: qty 12.5

## 2022-05-12 MED ORDER — IOHEXOL 350 MG/ML SOLN
75.0000 mL | Freq: Once | INTRAVENOUS | Status: AC | PRN
  Administered 2022-05-12: 75 mL via INTRAVENOUS

## 2022-05-12 MED ORDER — MORPHINE SULFATE (PF) 4 MG/ML IV SOLN
4.0000 mg | Freq: Once | INTRAVENOUS | Status: AC
  Administered 2022-05-12: 4 mg via INTRAVENOUS
  Filled 2022-05-12: qty 1

## 2022-05-12 MED ORDER — STERILE WATER FOR INJECTION IV SOLN
INTRAVENOUS | Status: DC
  Filled 2022-05-12 (×2): qty 1000

## 2022-05-12 MED ORDER — SODIUM CHLORIDE 0.9% IV SOLUTION: Freq: Once | INTRAVENOUS | Status: AC

## 2022-05-12 MED ORDER — NOREPINEPHRINE 4 MG/250ML-% IV SOLN
INTRAVENOUS | Status: AC
  Administered 2022-05-12: 10 ug/min via INTRAVENOUS
  Filled 2022-05-12: qty 250

## 2022-05-12 MED ORDER — ONDANSETRON HCL 4 MG/2ML IJ SOLN
4.0000 mg | Freq: Once | INTRAMUSCULAR | Status: AC
  Administered 2022-05-12: 4 mg via INTRAVENOUS
  Filled 2022-05-12: qty 2

## 2022-05-12 MED ORDER — FENTANYL CITRATE PF 50 MCG/ML IJ SOSY
50.0000 ug | PREFILLED_SYRINGE | Freq: Once | INTRAMUSCULAR | Status: AC
  Administered 2022-05-12: 50 ug via INTRAVENOUS
  Filled 2022-05-12: qty 1

## 2022-05-12 MED ORDER — HYDROMORPHONE HCL 1 MG/ML IJ SOLN
INTRAMUSCULAR | Status: AC
  Administered 2022-05-12: 2 mg via INTRAVENOUS
  Filled 2022-05-12: qty 2

## 2022-05-12 MED ORDER — FLUCONAZOLE IN SODIUM CHLORIDE 200-0.9 MG/100ML-% IV SOLN
200.0000 mg | INTRAVENOUS | Status: DC
  Administered 2022-05-13: 200 mg via INTRAVENOUS
  Filled 2022-05-12 (×2): qty 100

## 2022-05-12 MED ORDER — TBO-FILGRASTIM 480 MCG/0.8ML ~~LOC~~ SOSY
480.0000 ug | PREFILLED_SYRINGE | Freq: Once | SUBCUTANEOUS | Status: AC
  Administered 2022-05-13: 480 ug via SUBCUTANEOUS
  Filled 2022-05-12: qty 0.8

## 2022-05-12 MED ORDER — NOREPINEPHRINE 4 MG/250ML-% IV SOLN
2.0000 ug/min | INTRAVENOUS | Status: DC
  Filled 2022-05-12: qty 250

## 2022-05-12 MED ORDER — VASOPRESSIN 20 UNITS/100 ML INFUSION FOR SHOCK
0.0300 [IU]/min | INTRAVENOUS | Status: DC
  Administered 2022-05-12 – 2022-05-13 (×2): 0.03 [IU]/min via INTRAVENOUS
  Filled 2022-05-12 (×2): qty 100

## 2022-05-12 MED ORDER — VASOPRESSIN 20 UNIT/ML IV SOLN: 0.2000 m[IU]/kg/min | INTRAVENOUS | Status: DC

## 2022-05-12 MED ORDER — PHENYLEPHRINE 80 MCG/ML (10ML) SYRINGE FOR IV PUSH (FOR BLOOD PRESSURE SUPPORT)
PREFILLED_SYRINGE | INTRAVENOUS | Status: AC
  Administered 2022-05-12: 80 ug via INTRAVENOUS
  Filled 2022-05-12: qty 10

## 2022-05-12 MED ORDER — PHENYLEPHRINE 80 MCG/ML (10ML) SYRINGE FOR IV PUSH (FOR BLOOD PRESSURE SUPPORT): 80.0000 ug | PREFILLED_SYRINGE | Freq: Once | INTRAVENOUS | Status: AC

## 2022-05-12 MED ORDER — CHLORHEXIDINE GLUCONATE CLOTH 2 % EX PADS
6.0000 | MEDICATED_PAD | Freq: Every day | CUTANEOUS | Status: DC
  Administered 2022-05-13 (×2): 6 via TOPICAL

## 2022-05-12 MED ORDER — INSULIN ASPART 100 UNIT/ML IJ SOLN
0.0000 [IU] | INTRAMUSCULAR | Status: DC
  Administered 2022-05-13: 1 [IU] via SUBCUTANEOUS

## 2022-05-12 NOTE — H&P (Signed)
NAME:  Juan Yang, MRN:  [id number], DOB:  October 28, 1955, LOS: 0 ADMISSION DATE:  05/18/2022, CONSULTATION DATE:  05/27/2022 REFERRING MD:  Tegeler, CHIEF COMPLAINT:  Abd pain   History of Present Illness:  67 year old man with squamous cell lung cancer on active chemotherapy presenting with worsening abdominal pain and SOB x 2 days.  Workup has revealed pneumoperitoneum and significant pancytopenia.  Transferred down from [facility] for urgent surgical evaluation and resuscitation.    PCCM to admit.  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 rate (!) 35, height 6\' 1"  (1.854 m), weight 92.4 kg, SpO2 94 %.       No intake or output data in the 24 hours ending 05/29/2022 2227 Filed Weights   06/08/2022 1856  Weight: 92.4 kg    Examination: General: uncomfortable appearing HENT: MM dry, trachea midline Lungs: diminished, tachypneic Cardiovascular: tachy, ext warm, bounding pulses Abdomen: diffuse TTP without rebound, hypoactive BS Extremities: warm Neuro: Moves ext to command Psych: anxious and in pain Skin: port accessed  Neutrophils 0.1 Plts 23, getting 2 units  Resolved Hospital Problem list   N/A  Assessment & Plan:  Presumed perforated viscus Septic shock secondary to above AKI with acidemia Shock liver Chemo induced pancytopenia Stage IIb lung cancer  - Vanc/zosyn/fluconazole - Granix - Start bicarb drip - Avoid acidemia, coagulopathy, hypothermia - Pain control - 2 units plts - Appreciate CCS input, Dr. Grandville Silos to see shortly  Best Practice (right  click and "Reselect all SmartList Selections" daily)   Diet/type: NPO DVT prophylaxis: not indicated GI prophylaxis: PPI Lines: yes and it is still needed Foley:  N/A Code Status:  full code Last date of multidisciplinary goals of care discussion [pending]  Labs   CBC: Recent Labs  Lab 05/08/22 0841 06/02/2022 1901 05/22/2022 1955  WBC 3.3* 0.2*  --   NEUTROABS 1.7 0.1*  --   HGB 12.8* 12.5* 11.9*  HCT 37.6* 36.0* 35.0*  MCV 89.7 87.8  --   PLT 110* 23*  --     Basic Metabolic Panel: Recent Labs  Lab 05/08/22 0841 06/04/2022 1901 06/04/2022 1955  NA 133* 128* 133*  K 3.8 4.8 4.6  CL 103 98 100  CO2 19* 20*  --   GLUCOSE 104* 143* 124*  BUN 25* 35* 32*  CREATININE 1.12 1.89* 2.00*  CALCIUM 8.8* 8.5*  --   MG 2.0  --   --    GFR: Estimated Creatinine Clearance: 41.1 mL/min (A) (by C-G formula based on SCr of 2 mg/dL (H)). Recent Labs  Lab 05/08/22 0841 05/30/2022 1901 05/29/2022 2112  WBC 3.3* 0.2*  --   LATICACIDVEN  --   --  6.7*    Liver Function Tests: Recent Labs  Lab 05/08/22 0841 05/12/22 1901  AST 30 42*  ALT 54* 49*  ALKPHOS 100 100  BILITOT 0.6 3.2*  PROT 6.9 6.8  ALBUMIN 3.7 3.3*   Recent Labs  Lab 05/12/22 1901  LIPASE 22   No results for input(s): "AMMONIA" in the last 168 hours.  ABG    Component  Value Date/Time   TCO2 18 (L) 05/17/2022 1955     Coagulation Profile: No results for input(s): "INR", "PROTIME" in the last 168 hours.  Cardiac Enzymes: No results for input(s): "CKTOTAL", "CKMB", "CKMBINDEX", "TROPONINI" in the last 168 hours.  HbA1C: No results found for: "HGBA1C"  CBG: Recent Labs  Lab 06/08/2022 1946  GLUCAP 134*    Review of Systems:    Positive Symptoms in bold:  Constitutional fevers, chills, weight loss, fatigue, anorexia, malaise  Eyes decreased vision, double vision, eye irritation  Ears, Nose, Mouth, Throat sore throat, trouble swallowing, sinus congestion  Cardiovascular chest pain, paroxysmal  nocturnal dyspnea, lower ext edema, palpitations   Respiratory SOB, cough, DOE, hemoptysis, wheezing  Gastrointestinal nausea, vomiting, diarrhea  Genitourinary burning with urination, trouble urinating  Musculoskeletal joint aches, joint swelling, back pain  Integumentary  rashes, skin lesions  Neurological focal weakness, focal numbness, trouble speaking, headaches  Psychiatric depression, anxiety, confusion  Endocrine polyuria, polydipsia, cold intolerance, heat intolerance  Hematologic abnormal bruising, abnormal bleeding, unexplained nose bleeds  Allergic/Immunologic recurrent infections, hives, swollen lymph nodes     Past Medical History:  He,  has a past medical history of AAA (abdominal aortic aneurysm), Anxiety, Arthritis, 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   Surgical History:   Past Surgical History:  Procedure Laterality Date   BRONCHIAL NEEDLE ASPIRATION BIOPSY  11/29/2021   Procedure: BRONCHIAL NEEDLE ASPIRATION BIOPSIES;  Surgeon: Garner Nash, DO;  Location: [facility];  Service: Pulmonary;;   CHOLECYSTECTOMY N/A 12/13/2021   Procedure: LAPAROSCOPIC CHOLECYSTECTOMY;  Surgeon: Virl Cagey, MD;  Location: [facility];  Service: General;  Laterality: N/A;   COLONOSCOPY WITH PROPOFOL N/A 11/14/2016   Procedure: COLONOSCOPY WITH PROPOFOL;  Surgeon: Rogene Houston, MD;  [facility];  Service: Endoscopy;  Laterality: N/A;  9:15   IR IMAGING GUIDED PORT INSERTION  12/24/2021   LUMBAR LAMINECTOMY/DECOMPRESSION MICRODISCECTOMY Right 05/24/2019   Procedure: LAMINECTOMY AND FORAMINOTOMY LUMBAR  FIVE- SACRAL ONE RIGHT;  Surgeon: Vallarie Mare, MD;  Location: [facility];  Service: Neurosurgery;  Laterality: Right;  posterior/right   LUNG BIOPSY     MOHS SURGERY Right 05/2020   ear   MOHS SURGERY     POLYPECTOMY  11/14/2016   Procedure: POLYPECTOMY- SIGMOID COLON X3 TRANSVERSE COLON X2;  Surgeon: Rogene Houston, MD;  [facility];  Service: Endoscopy;;   rt ankle surgery     Titanium hinge   TONSILLECTOMY     TRANSFORAMINAL LUMBAR INTERBODY FUSION (TLIF) WITH PEDICLE SCREW FIXATION 1 LEVEL N/A 05/02/2020   Procedure: Lumbar five Sacral one Transforaminal lumbar interbody fusion;  Surgeon: Vallarie Mare, MD;  Location: [facility];  Service: Neurosurgery;  Laterality: N/A;   VIDEO BRONCHOSCOPY WITH ENDOBRONCHIAL ULTRASOUND N/A 11/29/2021   Procedure: VIDEO BRONCHOSCOPY WITH ENDOBRONCHIAL ULTRASOUND;  Surgeon: Garner Nash, DO;  Location: [facility];  Service: Pulmonary;  Laterality: N/A;     Social History:   reports that he quit smoking about 9 years ago. His smoking use included cigarettes. He has a 30.00 pack-year smoking history. He has never been exposed to tobacco smoke. He has never used smokeless tobacco. He reports that he does not drink alcohol and does not use drugs.  Family History:  His family history includes Arthritis in his sister; COPD in his father; Charcot-Marie-Tooth disease in his mother; Heart disease in his father.   Allergies Allergies  Allergen Reactions   Other Hives    DUCK EGGS ONLY   Atenolol Diarrhea and Nausea Only    Pt unsure of allergy? Possible fainting?   Cymbalta [Duloxetine Hcl] Other (See Comments)    Pt states it shut his bladder down and could not urinate; states it made him lethargic as well.   Latex Itching   Penicillins Hives    Did it involve swelling of the face/tongue/throat, SOB, or low BP? Unknown Did it involve sudden or severe rash/hives, skin peeling, or any reaction on the inside of your mouth or nose?  Unknown Did you need to seek medical attention at a hospital or doctor's office? yes When did it last happen?      Childhood If all above answers are "NO", may proceed with cephalosporin use.     Home Medications  Prior to Admission medications   Medication Sig Start Date End Date Taking? Authorizing Provider  amLODipine (NORVASC) 2.5 MG tablet Take 2.5 mg by mouth 2 (two) times daily. 04/18/19  Yes [provider]  aspirin 325 MG tablet Take 325 mg by mouth as needed for moderate pain.   Yes [provider]  CARBOPLATIN IV Inject into the vein every 21 ( twenty-one) days. Day 1, Day 8 q 21 days 05/01/22  Yes [provider]  cyclobenzaprine (FLEXERIL) 10 MG tablet Take 1 tablet (10 mg total) by mouth 3 (three) times daily as needed for muscle spasms. 05/03/20  Yes Vallarie Mare, MD  furosemide (LASIX) 40 MG tablet Take 1 tablet (40 mg total) by mouth daily. 12/31/21  Yes Mallipeddi, Vishnu P, MD  GEMCITABINE HCL IV Inject into the vein once a week. Day 1, Day 8 q 21 days   Yes [provider]  Glucosamine HCl-MSM (GLUCOSAMINE-MSM PO) Take 1 tablet by mouth daily.   Yes [provider]  lidocaine-prilocaine (EMLA) cream Apply a quarter-sized amount to port a cath site and cover with plastic wrap 1 hour prior to infusion appointments 04/30/22  Yes Derek Jack, MD  losartan (COZAAR) 25 MG tablet Take 25 mg by mouth daily. 07/23/20  Yes [provider]  metoprolol succinate (TOPROL-XL) 25 MG 24 hr tablet Take 1 tablet (25 mg total) by mouth daily. 10/03/21  Yes Rakes, Connye Burkitt, FNP  naproxen sodium (ALEVE) 220 MG tablet Take 220 mg by mouth daily as needed (pain).   Yes [provider]  Nivolumab (OPDIVO IV) Inject into the vein every 21 ( twenty-one) days. 05/01/22  Yes [provider]  oxyCODONE-acetaminophen (PERCOCET/ROXICET) 5-325 MG tablet Take 1-2 tablets by mouth every 6 (six) hours as needed for severe pain.   Yes  [provider]  Potassium 99 MG TABS Take 99 mg by mouth daily as needed (low potassium).   Yes [provider]  prochlorperazine (COMPAZINE) 10 MG tablet Take 1 tablet (10 mg total) by mouth every 6 (six) hours as needed for nausea or vomiting. 04/30/22  Yes Derek Jack, MD  sildenafil (VIAGRA) 100 MG tablet Take 100 mg by mouth daily as needed for erectile dysfunction.   Yes [provider]  oxyCODONE (OXY IR/ROXICODONE) 5 MG immediate release tablet Take 1-2 tablets (5-10 mg total) by mouth every 4 (four) hours as needed for breakthrough pain or severe pain. Patient not taking: Reported on 05/12/2022  12/15/21   Manuella Ghazi, Pratik D, DO  sodium bicarbonate 650 MG tablet Take 650 mg by mouth 2 (two) times daily. Patient not taking: Reported on 06/08/2022 11/09/20   [provider]     Critical care time: 33 mins independent of procedures

## 2022-05-12 NOTE — ED Provider Notes (Signed)
[facility] Provider Note  CSN: [id number] Arrival date & time: 05/26/2022 1818  Chief Complaint(s) Shortness of Breath  HPI Juan Yang is a 67 y.o. 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  Patient states that symptoms began worsening significantly yesterday and today he had acute onset epigastric and left lower quadrant pain.  Endorsing pleuritic chest pain and patient states that he has noticed he has had rapid heart rate for the last week.  Patient arrives tachycardic, pale, diaphoretic, ill-appearing.   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   History of kidney stones    Hypertension    Peripheral neuropathy 10/29/2016   Pneumonia    Port-A-Cath in place 04/30/2022   Transient cerebral ischemic attack, unspecified 05/10/2020   stroke   Patient Active Problem List   Diagnosis Date Noted   Port-A-Cath in place 04/30/2022   Osteomyelitis 01/13/2022   Nicotine abuse 12/31/2021   Sinus tachycardia by electrocardiogram 12/31/2021   Dyspnea on minimal exertion 12/31/2021   Coronary artery calcification seen on CAT scan 12/31/2021   Squamous cell lung cancer, right 12/18/2021   Calculus of gallbladder with acute cholecystitis without obstruction 12/12/2021   Leukocytosis  12/12/2021   Hypokalemia 12/12/2021   Hyponatremia 12/12/2021   Lung cancer 12/12/2021   Chronic diastolic CHF (congestive heart failure) 12/12/2021   Hilar adenopathy 11/22/2021   Abdominal aortic aneurysm (AAA) 3.0 cm to 5.5 cm in diameter in male 10/03/2021   Secondary hyperparathyroidism 07/22/2020   Vitamin D deficiency 07/22/2020   Anxiety and depression 07/22/2020   MGUS (monoclonal gammopathy of unknown significance) 07/22/2020   History of COPD 07/22/2020   Tobacco use disorder 07/22/2020   Fatty liver 07/22/2020   Essential hypertension 07/22/2020   DDD (degenerative disc disease), lumbar 07/22/2020   Hypercholesteremia 06/30/2020   Stage 3a chronic kidney disease 06/30/2020   Lumbar radiculopathy 05/02/2020   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 Authorizing Provider  amLODipine (NORVASC) 2.5 MG tablet Take 2.5 mg by mouth 2 (two) times daily. 04/18/19   [provider]  aspirin EC (ADULT ASPIRIN REGIMEN) 81 MG tablet Take 81-325 mg by mouth daily. 09/06/21   [provider]  CARBOPLATIN IV Inject into the vein every 21 ( twenty-one) days. Day 1, Day 8 q 21 days 05/01/22   [provider]  cyclobenzaprine (FLEXERIL) 10 MG tablet Take 1 tablet (10 mg total) by mouth 3 (three) times daily as needed for muscle spasms. 05/03/20   Vallarie Mare, MD  furosemide (LASIX) 40 MG  tablet Take 1 tablet (40 mg total) by mouth daily. 12/31/21   Mallipeddi, Vishnu P, MD  GEMCITABINE HCL IV Inject into the vein once a week. Day 1, Day 8 q 21 days    [provider]  Glucosamine HCl-MSM (GLUCOSAMINE-MSM PO) Take 1 tablet by mouth daily.    [provider]  lidocaine-prilocaine (EMLA) cream Apply a quarter-sized amount to port a cath site and cover with  plastic wrap 1 hour prior to infusion appointments Patient not taking: Reported on 05/08/2022 04/30/22   Derek Jack, MD  losartan (COZAAR) 25 MG tablet Take 25 mg by mouth daily. 07/23/20   [provider]  metoprolol succinate (TOPROL-XL) 25 MG 24 hr tablet Take 1 tablet (25 mg total) by mouth daily. 10/03/21   Baruch Gouty, FNP  naproxen sodium (ALEVE) 220 MG tablet Take 220 mg by mouth daily as needed (pain).    [provider]  Nivolumab (OPDIVO IV) Inject into the vein every 21 ( twenty-one) days. 05/01/22   [provider]  oxyCODONE (OXY IR/ROXICODONE) 5 MG immediate release tablet Take 1-2 tablets (5-10 mg total) by mouth every 4 (four) hours as needed for breakthrough pain or severe pain. 12/15/21   Manuella Ghazi, Pratik D, DO  Potassium 99 MG TABS Take 99 mg by mouth daily as needed (low potassium).    [provider]  prochlorperazine (COMPAZINE) 10 MG tablet Take 1 tablet (10 mg total) by mouth every 6 (six) hours as needed for nausea or vomiting. Patient not taking: Reported on 05/08/2022 04/30/22   Derek Jack, MD  sildenafil (VIAGRA) 100 MG tablet Take 100 mg by mouth daily as needed for erectile dysfunction.    [provider]  sodium bicarbonate 650 MG tablet Take 650 mg by mouth 2 (two) times daily. 11/09/20   [provider]                                                                                                                                    Past Surgical History Past Surgical History:  Procedure Laterality Date   BRONCHIAL NEEDLE ASPIRATION BIOPSY  11/29/2021   Procedure: BRONCHIAL NEEDLE ASPIRATION BIOPSIES;  Surgeon: Garner Nash, DO;  Location: [facility];  Service: Pulmonary;;   CHOLECYSTECTOMY N/A 12/13/2021   Procedure: LAPAROSCOPIC CHOLECYSTECTOMY;  Surgeon: Virl Cagey, MD;  Location: [facility];  Service: General;  Laterality: N/A;   COLONOSCOPY WITH PROPOFOL N/A 11/14/2016   Procedure:  COLONOSCOPY WITH PROPOFOL;  Surgeon: Rogene Houston, MD;  Location[facility];  Service: Endoscopy;  Laterality: N/A;  9:15   IR IMAGING GUIDED PORT INSERTION  12/24/2021   LUMBAR LAMINECTOMY/DECOMPRESSION MICRODISCECTOMY Right 05/24/2019   Procedure: LAMINECTOMY AND FORAMINOTOMY LUMBAR FIVE- SACRAL ONE RIGHT;  Surgeon: Vallarie Mare, MD;  Location: [facility];  Service: Neurosurgery;  Laterality: Right;  posterior/right   LUNG BIOPSY     MOHS SURGERY Right  05/2020   ear   MOHS SURGERY     POLYPECTOMY  11/14/2016   Procedure: POLYPECTOMY- SIGMOID COLON X3 TRANSVERSE COLON X2;  Surgeon: Rogene Houston, MD;  Location[facility];  Service: Endoscopy;;   rt ankle surgery     Titanium hinge   TONSILLECTOMY     TRANSFORAMINAL LUMBAR INTERBODY FUSION (TLIF) WITH PEDICLE SCREW FIXATION 1 LEVEL N/A 05/02/2020   Procedure: Lumbar five Sacral one Transforaminal lumbar interbody fusion;  Surgeon: Vallarie Mare, MD;  Location: [place];  Service: Neurosurgery;  Laterality: N/A;   VIDEO BRONCHOSCOPY WITH ENDOBRONCHIAL ULTRASOUND N/A 11/29/2021   Procedure: VIDEO BRONCHOSCOPY WITH ENDOBRONCHIAL ULTRASOUND;  Surgeon: Garner Nash, DO;  Location: [facility];  Service: Pulmonary;  Laterality: N/A;   Family History Family History  Problem Relation Age of Onset   Charcot-Marie-Tooth disease Mother    Heart disease Father    COPD Father    Arthritis Sister     Social History Social History   Tobacco Use   Smoking status: Former    Packs/day: 1.50    Years: 20.00    Additional pack years: 0.00    Total pack years: 30.00    Types: Cigarettes    Quit date: 11/12/2012    Years since quitting: 9.5    Passive exposure: Never   Smokeless tobacco: Never   Tobacco comments:    Pt smokes 1 pack a month and vapes CBD oil. ARJ 11/22/21  Vaping Use   Vaping Use: Former   Quit date: 12/18/2021   Substances: CBD  Substance Use Topics   Alcohol use: No   Drug use: No    Allergies Other, Atenolol, Cymbalta [duloxetine hcl], Latex, and Penicillins  Review of Systems Review of Systems  Respiratory:  Positive for shortness of breath.   Gastrointestinal:  Positive for abdominal pain, constipation, nausea and vomiting.    Physical Exam Vital Signs  I have reviewed the triage vital signs BP 112/70   Pulse (!) 149   Temp 98 F (36.7 C)   Resp (!) 26   Ht 6\' 1"  (1.854 m)   Wt 92.4 kg   SpO2 94%   BMI 26.86 kg/m   Physical Exam Constitutional:      General: He is not in acute distress.    Appearance: Normal appearance. He is ill-appearing and diaphoretic.  HENT:     Head: Normocephalic and atraumatic.     Nose: No congestion or rhinorrhea.  Eyes:     General:        Right eye: No discharge.        Left eye: No discharge.     Extraocular Movements: Extraocular movements intact.     Pupils: Pupils are equal, round, and reactive to light.  Cardiovascular:     Rate and Rhythm: Regular rhythm. Tachycardia present.     Heart sounds: No murmur heard. Pulmonary:     Effort: Tachypnea present. No respiratory distress.     Breath sounds: No wheezing or rales.  Abdominal:     General: There is no distension.     Tenderness: There is abdominal tenderness.  Musculoskeletal:        General: Normal range of motion.     Cervical back: Normal range of motion.  Skin:    General: Skin is warm.  Neurological:     General: No focal deficit present.     Mental Status: He is alert.     ED Results and Treatments Labs (  all labs ordered are listed, but only abnormal results are displayed) Labs Reviewed  COMPREHENSIVE METABOLIC PANEL  CBC WITH DIFFERENTIAL/PLATELET  LIPASE, BLOOD  URINALYSIS, ROUTINE W REFLEX MICROSCOPIC  TROPONIN I (HIGH SENSITIVITY)                                                                                                                          Radiology No results found.  Pertinent labs & imaging results that were  available during my care of the patient were reviewed by me and considered in my medical decision making (see MDM for details).  Medications Ordered in ED Medications  fentaNYL (SUBLIMAZE) injection 50 mcg (has no administration in time range)  lactated ringers bolus 1,000 mL (has no administration in time range)                                                                                                                                     Procedures .Critical Care  Performed by: Teressa Lower, MD Authorized by: Teressa Lower, MD   Critical care provider statement:    Critical care time (minutes):  104   Critical care was necessary to treat or prevent imminent or life-threatening deterioration of the following conditions:  Sepsis   Critical care was time spent personally by me on the following activities:  Development of treatment plan with patient or surrogate, discussions with consultants, evaluation of patient's response to treatment, examination of patient, ordering and review of laboratory studies, ordering and review of radiographic studies, ordering and performing treatments and interventions, pulse oximetry, re-evaluation of patient's condition and review of old charts   (including critical care time)  Medical Decision Making / ED Course   This patient presents to the ED for concern of abdominal pain, shortness of breath, this involves an extensive number of treatment options, and is a complaint that carries with it a high risk of complications and morbidity.  The differential diagnosis includes aortic dissection, ruptured AAA, pneumoperitoneum, sepsis, neutropenic fever, typhlitis, PE  MDM: Seen emergency room for evaluation of chest and abdominal pain.  Physical exam reveals a pale, ill-appearing tachycardic patient with significant tenderness in the abdomen with guarding.  Oxygen supplementation initiated and aggressive fluid resuscitation begun initial i-STAT with a  creatinine of 2.0, but hemoglobin stable at 11.9.  Bedside ultrasound performed to rule out ruptured AAA and no free fluid seen.  Patient  then taken emergently to CAT scan for dissection study which ultimately revealed pneumoperitoneum of unknown origin.  While in CAT scan, patient became significantly hypotensive requiring vaso and levo initiation.  Per radiology, no evidence of typhlitis.  CBC is highly concerning with new significant neutropenia with white blood cell count of 0.2, 0.1 neutrophils, significant new thrombocytopenia of 23, hemoglobin 12.5, CMP with hyponatremia 128, BUN 35, creatinine 1.89, albumin 3.3, total bili 3.2.  I consulted general surgery and spoke with Dr.Pappyliou who is recommending transfer ED to ED to [facility] given that this will be a high risk surgery and patient will likely require multiple platelet infusions.  We only have 1 unit of platelets here in the entire hospital [facility] and given patient's critical illness, patient's best chance of successful surgery would be at higher level care at [facility].  Broad-spectrum antibiotics initiated and platelets ordered.  Patient then transferred to [facility]   Additional history obtained: -Additional history obtained from wife -External records from outside source obtained and reviewed including: Chart review including previous notes, labs, imaging, consultation notes   Lab Tests: -I ordered, reviewed, and interpreted labs.   The pertinent results include:   Labs Reviewed  COMPREHENSIVE METABOLIC PANEL  CBC WITH DIFFERENTIAL/PLATELET  LIPASE, BLOOD  URINALYSIS, ROUTINE W REFLEX MICROSCOPIC  TROPONIN I (HIGH SENSITIVITY)      Imaging Studies ordered: I ordered imaging studies including CT dissection study I independently visualized and interpreted imaging. I agree with the radiologist interpretation   Medicines ordered and prescription drug management: Meds ordered this encounter  Medications   fentaNYL  (SUBLIMAZE) injection 50 mcg   lactated ringers bolus 1,000 mL    -I have reviewed the patients home medicines and have made adjustments as needed  Critical interventions Fluid resuscitation, Levophed, vasopressin, antibiotics, surgery consultation, emergent transfer  Consultations Obtained: I requested consultation with the general surgeon on-callb Dr. Derryl Harbor ,  and discussed lab and imaging findings as well as pertinent plan - they recommend: Transfer to [facility]   Cardiac Monitoring: The patient was maintained on a cardiac monitor.  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   History of kidney stones    Hypertension    Peripheral neuropathy 10/29/2016   Pneumonia    Port-A-Cath in place 04/30/2022   Transient cerebral ischemic attack, unspecified 05/10/2020   stroke      Dispostion: I considered admission for this patient, and due to pneumoperitoneum, patient require hospital admission     Final Clinical Impression(s) / ED Diagnoses Final diagnoses:  None     @PCDICTATION @    Teressa Lower, MD 06/05/2022 2117

## 2022-05-12 NOTE — Progress Notes (Signed)
RT went to collect ordered ABG from patient.  Patient not in room.

## 2022-05-12 NOTE — ED Triage Notes (Signed)
Patient says he started feeling short of breath yesterday and also experienced some abdominal discomfort. He says the pain is near his spleen. Patient says he feels more discomfort with inhalation.

## 2022-05-12 NOTE — Progress Notes (Addendum)
Pharmacy Antibiotic Note  Juan Yang is a 67 y.o. male admitted on 05/17/2022 with sepsis.  Pharmacy has been consulted for Vancomycin and fluconazole dosing.  Noted pt with SCr up to 2 (was 1.1 on 05/08/22)  Plan: Fluconazole 200mg  IV q24h Flagyl q12h Cefepime 2g q12h Vancomycin x1 given F/u SCr in a.m. to further guide dosing Will f/u micro data, and pt's clinical condition Vanc levels prn   Height: 6\' 1"  (185.4 cm) Weight: 92.4 kg (203 lb 9.6 oz) IBW/kg (Calculated) : 79.9  Temp (24hrs), Avg:98.1 F (36.7 C), Min:98 F (36.7 C), Max:98.1 F (36.7 C)  Recent Labs  Lab 05/08/22 0841 05/16/2022 1901 06/01/2022 1955 06/07/2022 2112  WBC 3.3* 0.2*  --   --   CREATININE 1.12 1.89* 2.00*  --   LATICACIDVEN  --   --   --  6.7*     Estimated Creatinine Clearance: 41.1 mL/min (A) (by C-G formula based on SCr of 2 mg/dL (H)).    Allergies  Allergen Reactions   Other Hives    DUCK EGGS ONLY   Atenolol Diarrhea and Nausea Only    Pt unsure of allergy? Possible fainting?   Cymbalta [Duloxetine Hcl] Other (See Comments)    Pt states it shut his bladder down and could not urinate; states it made him lethargic as well.   Latex Itching   Penicillins Hives    Did it involve swelling of the face/tongue/throat, SOB, or low BP? Unknown Did it involve sudden or severe rash/hives, skin peeling, or any reaction on the inside of your mouth or nose? Unknown Did you need to seek medical attention at a hospital or doctor's office? yes When did it last happen?      Childhood If all above answers are "NO", may proceed with cephalosporin use.    Antimicrobials this admission: 4/1 Vanc >>  4/1 Cefepime x 1  Microbiology results: pending  Lorelei Pont, PharmD, BCPS 05/12/2022 10:54 PM ED Clinical Pharmacist -  [phone]

## 2022-05-12 NOTE — ED Notes (Signed)
Date and time results received: 05/12/2022 2015 (use smartphrase ".now" to insert current time)  Test:CBC Critical Value: WBC 0.2   Platelets 23   Absolute neutrophils 0.1  Name of Provider Notified: Charm Barges, MD

## 2022-05-12 NOTE — ED Notes (Signed)
Wife has phone/wallet/ clothes

## 2022-05-12 NOTE — ED Triage Notes (Signed)
Pt arrived via ptar

## 2022-05-12 NOTE — Progress Notes (Addendum)
[facility]  Called by Dr. Matilde Sprang regarding patient.  He is a 67 year old male who has CKD, MGUS, and is actively receiving chemotherapy treatment for lung cancer who presented to the hospital with shortness of breath and abdominal pain.  He was noted to be tachycardic, tachypneic, and hypotensive, requiring levophed after receiving 2 L of IVF.  On CBC, he was noted to have a severe leukopenia of 0.2, neutropenia of 0.1, and platelets of 23.  He underwent a CTA of the chest, abdomen and pelvis, which demonstrated a small amount of pneumoperitoneum in the upper abdomen without clear etiology of source.  I did review the imaging myself.  Given this patient's significant comorbidities, neutropenia and thrombocytopenia, I believe he will need resuscitation in ICU and platelet administration prior to any surgical interventions.  He will require transfer to [facility] for ICU admission and resuscitation, especially given limitations at [facility] regarding blood product availability and ICU coverage.  I did discuss the case with my partner, Dr. Arnoldo Morale, who agrees that transfer is appropriate.  I notified Dr. Grandville Silos with [facility] of the patient's transfer.  EDP stated that he would notify PCCM.  Please call with any questions or concerns.  Graciella Freer, DO [facility] [address] [place],  [id number] [phone] (office)

## 2022-05-12 NOTE — ED Provider Notes (Signed)
Patient transferred from [facility] for evaluation by both critical care and general surgery for free air in the abdomen, lab abnormalities, and hypoxia/hypotension.  I spoke to critical care who will come see him.  I spoke to general surgery who will come see him.  Patient will be admitted for further management.   Erol Flanagin, Gwenyth Allegra, MD 06/02/2022 [phone]

## 2022-05-12 NOTE — Telephone Encounter (Signed)
Received call from wife stating that he has had severe pain in L ribcage area since last treatment, along with profound fatigue.  Per Dr. Delton Coombes, he was advised to add ibuprofen every 8 hours as needed.  Made her aware that these are side effects from treatment, however to make [place] aware if things do not improve over the next couple of days.

## 2022-05-12 NOTE — Progress Notes (Signed)
Pharmacy Antibiotic Note  Juan Yang is a 67 y.o. male admitted on 05/18/2022 with sepsis.  Pharmacy has been consulted for Vancomycin dosing. Pt also received Cefepime x 1 dose.  Noted pt with SCr up to 2 (was 1.1 on 05/08/22)  Plan: Vancomycin 1750mg  IV now  F/u SCr in a.m. to further guide dosing Will f/u micro data, and pt's clinical condition Vanc levels prn F/u continuation of Cefepime on admission   Height: 6\' 1"  (185.4 cm) Weight: 92.4 kg (203 lb 9.6 oz) IBW/kg (Calculated) : 79.9  Temp (24hrs), Avg:98 F (36.7 C), Min:98 F (36.7 C), Max:98 F (36.7 C)  Recent Labs  Lab 05/08/22 0841 05/30/2022 1901 05/31/2022 1955  WBC 3.3* 0.2*  --   CREATININE 1.12 1.89* 2.00*    Estimated Creatinine Clearance: 41.1 mL/min (A) (by C-G formula based on SCr of 2 mg/dL (H)).    Allergies  Allergen Reactions   Other Hives    DUCK EGGS ONLY   Atenolol Diarrhea and Nausea Only    Pt unsure of allergy? Possible fainting?   Cymbalta [Duloxetine Hcl] Other (See Comments)    Pt states it shut his bladder down and could not urinate; states it made him lethargic as well.   Latex Itching   Penicillins Hives    Did it involve swelling of the face/tongue/throat, SOB, or low BP? Unknown Did it involve sudden or severe rash/hives, skin peeling, or any reaction on the inside of your mouth or nose? Unknown Did you need to seek medical attention at a hospital or doctor's office? yes When did it last happen?      Childhood If all above answers are "NO", may proceed with cephalosporin use.    Antimicrobials this admission: 4/1 Vanc >>  4/1 Cefepime x 1  Microbiology results: pending  Thank you for allowing pharmacy to be a part of this patient's care.  Sherlon Handing, PharmD, BCPS Please see amion for complete clinical pharmacist phone list 05/18/2022 8:15 PM

## 2022-05-12 NOTE — Sepsis Progress Note (Signed)
Following for sepsis monitoring ?

## 2022-05-12 NOTE — Consult Note (Signed)
Reason for Consult:pneumoperitoneum Referring Physician: Gerald Stabs Tegeler  Juan Yang is an 67 y.o. male.  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  Initially, it was thought to be side effects.  He developed worsening shortness of breath and abdominal pain and went to [facility] emergency department.  Workup there revealed significant pancytopenia and CT scan of the abdomen and pelvis showed a small amount of pneumoperitoneum without any clear etiology.  He was transferred to the [facility] emergency department for further evaluation.  He has been seen by the critical care medicine team who plans to admit him.  He also mentions some obstipation recently.  His wife is at the bedside and helps with history.  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   History of kidney stones    Hypertension    Peripheral neuropathy 10/29/2016   Pneumonia    Port-A-Cath in place 04/30/2022   Transient cerebral ischemic attack, unspecified 05/10/2020   stroke    Past Surgical History:  Procedure Laterality Date   BRONCHIAL NEEDLE ASPIRATION BIOPSY  11/29/2021   Procedure: BRONCHIAL NEEDLE ASPIRATION BIOPSIES;  Surgeon: Garner Nash, DO;  Location: [place] [facility];  Service: Pulmonary;;   CHOLECYSTECTOMY N/A 12/13/2021   Procedure: LAPAROSCOPIC CHOLECYSTECTOMY;  Surgeon: Virl Cagey, MD;  Location: [facility];  Service: General;  Laterality: N/A;   COLONOSCOPY WITH PROPOFOL N/A  11/14/2016   Procedure: COLONOSCOPY WITH PROPOFOL;  Surgeon: Rogene Houston, MD;  [facility];  Service: Endoscopy;  Laterality: N/A;  9:15   IR IMAGING GUIDED PORT INSERTION  12/24/2021   LUMBAR LAMINECTOMY/DECOMPRESSION MICRODISCECTOMY Right 05/24/2019   Procedure: LAMINECTOMY AND FORAMINOTOMY LUMBAR FIVE- SACRAL ONE RIGHT;  Surgeon: Vallarie Mare, MD;  Location: [place];  Service: Neurosurgery;  Laterality: Right;  posterior/right   LUNG BIOPSY     MOHS SURGERY Right 05/2020   ear   MOHS SURGERY     POLYPECTOMY  11/14/2016   Procedure: POLYPECTOMY- SIGMOID COLON X3 TRANSVERSE COLON X2;  Surgeon: Rogene Houston, MD;  [facility];  Service: Endoscopy;;   rt ankle surgery     Titanium hinge   TONSILLECTOMY     TRANSFORAMINAL LUMBAR INTERBODY FUSION (TLIF) WITH PEDICLE SCREW FIXATION 1 LEVEL N/A 05/02/2020   Procedure: Lumbar five Sacral one Transforaminal lumbar interbody fusion;  Surgeon: Vallarie Mare, MD;  Location: [place];  Service: Neurosurgery;  Laterality: N/A;   VIDEO BRONCHOSCOPY WITH ENDOBRONCHIAL ULTRASOUND N/A 11/29/2021   Procedure: VIDEO BRONCHOSCOPY WITH ENDOBRONCHIAL ULTRASOUND;  Surgeon: Garner Nash, DO;  Location: [place];  Service: Pulmonary;  Laterality: N/A;    Family History  Problem Relation Age of Onset   Charcot-Marie-Tooth disease Mother    Heart disease Father    COPD Father    Arthritis Sister     Social History:  reports that  he quit smoking about 9 years ago. His smoking use included cigarettes. He has a 30.00 pack-year smoking history. He has never been exposed to tobacco smoke. He has never used smokeless tobacco. He reports that he does not drink alcohol and does not use drugs.  Allergies:  Allergies  Allergen Reactions   Other Hives    DUCK EGGS ONLY   Atenolol Diarrhea and Nausea Only    Pt unsure of allergy? Possible fainting?   Cymbalta [Duloxetine Hcl] Other (See Comments)    Pt states it shut his  bladder down and could not urinate; states it made him lethargic as well.   Latex Itching   Penicillins Hives    Did it involve swelling of the face/tongue/throat, SOB, or low BP? Unknown Did it involve sudden or severe rash/hives, skin peeling, or any reaction on the inside of your mouth or nose? Unknown Did you need to seek medical attention at a hospital or doctor's office? yes When did it last happen?      Childhood If all above answers are "NO", may proceed with cephalosporin use.    Medications: I have reviewed the patient's current medications.  Results for orders placed or performed during the hospital encounter of 05/27/2022 (from the past 48 hour(s))  Comprehensive metabolic panel     Status: Abnormal   Collection Time: 05/18/2022  7:01 PM  Result Value Ref Range   Sodium 128 (L) 135 - 145 mmol/L   Potassium 4.8 3.5 - 5.1 mmol/L   Chloride 98 98 - 111 mmol/L   CO2 20 (L) 22 - 32 mmol/L   Glucose, Bld 143 (H) 70 - 99 mg/dL    Comment: Glucose reference range applies only to samples taken after fasting for at least 8 hours.   BUN 35 (H) 8 - 23 mg/dL   Creatinine, Ser 1.89 (H) 0.61 - 1.24 mg/dL   Calcium 8.5 (L) 8.9 - 10.3 mg/dL   Total Protein 6.8 6.5 - 8.1 g/dL   Albumin 3.3 (L) 3.5 - 5.0 g/dL   AST 42 (H) 15 - 41 U/L   ALT 49 (H) 0 - 44 U/L   Alkaline Phosphatase 100 38 - 126 U/L   Total Bilirubin 3.2 (H) 0.3 - 1.2 mg/dL   GFR, Estimated 39 (L) >60 mL/min    Comment: (NOTE) Calculated using the CKD-EPI Creatinine Equation (2021)    Anion gap 10 5 - 15    Comment: Performed at [facility], [address], [place], [place] [address]  CBC with Differential     Status: Abnormal   Collection Time: 05/12/2022  7:01 PM  Result Value Ref Range   WBC 0.2 (LL) 4.0 - 10.5 K/uL    Comment: REPEATED TO VERIFY WHITE COUNT CONFIRMED ON SMEAR THIS CRITICAL RESULT HAS VERIFIED AND BEEN CALLED TO E TEASLEY RN BY Remus Loffler ON 04 01 2024 AT 2004, AND HAS BEEN READ BACK.     RBC  4.10 (L) 4.22 - 5.81 MIL/uL   Hemoglobin 12.5 (L) 13.0 - 17.0 g/dL   HCT 36.0 (L) 39.0 - 52.0 %   MCV 87.8 80.0 - 100.0 fL   MCH 30.5 26.0 - 34.0 pg   MCHC 34.7 30.0 - 36.0 g/dL   RDW 12.2 11.5 - 15.5 %   Platelets 23 (LL) 150 - 400 K/uL    Comment: SPECIMEN CHECKED FOR CLOTS Immature Platelet Fraction may be clinically indicated, consider ordering this additional test [id number] REPEATED TO VERIFY THIS CRITICAL RESULT HAS  VERIFIED AND BEEN CALLED TO E TEASLEY RN BY Remus Loffler ON [id number] 2024 AT 2004, AND HAS BEEN READ BACK.     nRBC 0.0 0.0 - 0.2 %   Neutrophils Relative % 40 %   Neutro Abs 0.1 (LL) 1.7 - 7.7 K/uL    Comment: REPEATED TO VERIFY THIS CRITICAL RESULT HAS VERIFIED AND BEEN CALLED TO E TEASLEY RN BY Remus Loffler ON 04 01 2024 AT 2004, AND HAS BEEN READ BACK.     Lymphocytes Relative 60 %   Lymphs Abs 0.1 (L) 0.7 - 4.0 K/uL   Monocytes Relative 0 %   Monocytes Absolute 0.0 (L) 0.1 - 1.0 K/uL   Eosinophils Relative 0 %   Eosinophils Absolute 0.0 0.0 - 0.5 K/uL   Basophils Relative 0 %   Basophils Absolute 0.0 0.0 - 0.1 K/uL   RBC Morphology MORPHOLOGY UNREMARKABLE    Smear Review PLATELET COUNT CONFIRMED BY SMEAR    Immature Granulocytes 0 %   Abs Immature Granulocytes 0.00 0.00 - 0.07 K/uL   Reactive, Benign Lymphocytes PRESENT     Comment: Performed at [facility], [address] [place], [place] [address]  Lipase, blood     Status: None   Collection Time: 06/02/2022  7:01 PM  Result Value Ref Range   Lipase 22 11 - 51 U/L    Comment: Performed at [facility], [address] [place], [place] [address]  Troponin I (High Sensitivity)     Status: None   Collection Time: 05/30/2022  7:01 PM  Result Value Ref Range   Troponin I (High Sensitivity) 10 <18 ng/L    Comment: (NOTE) Elevated high sensitivity troponin I (hsTnI) values and significant  changes across serial measurements may suggest ACS but many other  chronic and acute conditions are known to  elevate hsTnI results.  Refer to the "Links" section for chest pain algorithms and additional  guidance. Performed at [facility], [address] [place], [place] [address]   CBG monitoring, ED     Status: Abnormal   Collection Time: 06/06/2022  7:46 PM  Result Value Ref Range   Glucose-Capillary 134 (H) 70 - 99 mg/dL    Comment: Glucose reference range applies only to samples taken after fasting for at least 8 hours.  Type and screen [facility]     Status: None   Collection Time: 06/03/2022  7:47 PM  Result Value Ref Range   ABO/RH(D) B NEG    Antibody Screen NEG    Sample Expiration      05/15/2022,2359 Performed at [facility], [address], [place], [place] [address]   I-stat chem 8, ED (not at [facility], [facility] or [facility])     Status: Abnormal   Collection Time: 05/16/2022  7:55 PM  Result Value Ref Range   Sodium 133 (L) 135 - 145 mmol/L   Potassium 4.6 3.5 - 5.1 mmol/L   Chloride 100 98 - 111 mmol/L   BUN 32 (H) 8 - 23 mg/dL   Creatinine, Ser 2.00 (H) 0.61 - 1.24 mg/dL   Glucose, Bld 124 (H) 70 - 99 mg/dL    Comment: Glucose reference range applies only to samples taken after fasting for at least 8 hours.   Calcium, Ion 1.17 1.15 - 1.40 mmol/L   TCO2 18 (L) 22 - 32 mmol/L   Hemoglobin 11.9 (L) 13.0 - 17.0 g/dL   HCT 35.0 (L) 39.0 - 52.0 %  Prepare platelet pheresis  Status: None (Preliminary result)   Collection Time: 06/01/2022  8:45 PM  Result Value Ref Range   Unit Number [id number]    Blood Component Type PLTP1 PSORALEN TREATED    Unit division 00    Status of Unit ISSUED    Transfusion Status      OK TO TRANSFUSE Performed at [facility], [address], [place], [place] [address]   Blood culture (routine x 2)     Status: None (Preliminary result)   Collection Time: 05/26/2022  9:12 PM   Specimen: BLOOD  Result Value Ref Range   Specimen Description BLOOD BLOOD RIGHT HAND    Special Requests      BOTTLES DRAWN AEROBIC AND ANAEROBIC Blood Culture adequate  volume Performed at [facility], [address] [place], [place] [address]    Culture PENDING    Report Status PENDING   Blood culture (routine x 2)     Status: None (Preliminary result)   Collection Time: 06/06/2022  9:12 PM   Specimen: BLOOD  Result Value Ref Range   Specimen Description BLOOD BLOOD RIGHT HAND    Special Requests      BOTTLES DRAWN AEROBIC AND ANAEROBIC Blood Culture adequate volume Performed at [facility], [address] [place], [place] [address]    Culture PENDING    Report Status PENDING   Troponin I (High Sensitivity)     Status: None   Collection Time: 05/26/2022  9:12 PM  Result Value Ref Range   Troponin I (High Sensitivity) 10 <18 ng/L    Comment: (NOTE) Elevated high sensitivity troponin I (hsTnI) values and significant  changes across serial measurements may suggest ACS but many other  chronic and acute conditions are known to elevate hsTnI results.  Refer to the "Links" section for chest pain algorithms and additional  guidance. Performed at [facility], [address] [place], [place] [address]   Lactic acid, plasma     Status: Abnormal   Collection Time: 06/10/2022  9:12 PM  Result Value Ref Range   Lactic Acid, Venous 6.7 (HH) 0.5 - 1.9 mmol/L    Comment: CRITICAL RESULT CALLED TO, READ BACK BY AND VERIFIED WITH BELTON,C ON 05/17/2022 AT 2155 BY LOY,C Performed at [facility], [address], [place],  [address]     CT Angio Chest/Abd/Pel for Dissection W and/or Wo Contrast  Result Date: 05/19/2022 CLINICAL DATA:  Acute aortic syndrome suspected. EXAM: CT ANGIOGRAPHY CHEST, ABDOMEN AND PELVIS TECHNIQUE: Non-contrast CT of the chest was initially obtained. Multidetector CT imaging through the chest, abdomen and pelvis was performed using the standard protocol during bolus administration of intravenous contrast. Multiplanar reconstructed images and MIPs were obtained and reviewed to evaluate the vascular anatomy. RADIATION DOSE REDUCTION: This  exam was performed according to the departmental dose-optimization program which includes automated exposure control, adjustment of the mA and/or kV according to patient size and/or use of iterative reconstruction technique. CONTRAST:  29mL OMNIPAQUE IOHEXOL 350 MG/ML SOLN COMPARISON:  Chest CT dated 11/29/2021. FINDINGS: Evaluation of this exam is limited due to respiratory motion artifact as well as due to suboptimal opacification and timing of the contrast. CTA CHEST FINDINGS Cardiovascular: There is no cardiomegaly. Trace pericardial effusion. Coronary vascular calcification of the LAD. Mild atherosclerotic calcification of the thoracic aorta. No aneurysmal dilatation or dissection. The origins of the great vessels of the aortic arch appear patent. Left-sided Port-A-Cath with tip in the central SVC. Focal low density in the right hilum and branching of  the right main pulmonary artery (47/4 and 53/4) may be extravascular and represent hilar lymph nodes. The possibility of pulmonary embolism is not entirely excluded clinical correlation is recommended. Dedicated CTA or V/Q scan may provide better evaluation if there is high clinical concern for acute PE. Mediastinum/Nodes: No hilar or mediastinal adenopathy. Fluid noted within the esophagus which may represent reflux or delayed clearance. No mediastinal fluid collection. Lungs/Pleura: Small left pleural effusion and left lung base atelectasis or infiltrate. The right lung is clear. There is no pneumothorax. The central airways are patent. Musculoskeletal: No acute osseous pathology. Review of the MIP images confirms the above findings. CTA ABDOMEN AND PELVIS FINDINGS VASCULAR Aorta: Mild atherosclerotic calcification of the abdominal aorta. No aortic dissection. There is a 3 cm infrarenal abdominal aortic aneurysm. No periaortic fluid collection. Celiac: The celiac axis and its major branches are patent. SMA: The SMA is patent. Renals: The renal arteries are  patent. IMA: The IMA is patent. Inflow: Mild atherosclerotic calcification of the iliac arteries. The iliac arteries are patent. No dissection. Dilated right common iliac artery measures 18 mm in diameter. Veins: The IVC is unremarkable.  No portal venous gas. Review of the MIP images confirms the above findings. NON-VASCULAR Small pneumoperitoneum in the upper abdomen of indeterminate etiology but concerning for bowel perforation. Clinical correlation and surgical consult is advised. Small free fluid in the pelvis. Hepatobiliary: The liver is unremarkable. No biliary ductal dilatation. Cholecystectomy. Pancreas: Unremarkable. No pancreatic ductal dilatation or surrounding inflammatory changes. Spleen: Normal in size without focal abnormality. Adrenals/Urinary Tract: The adrenal glands unremarkable. Small bilateral renal cysts and additional subcentimeter hypodense lesions which are too small to characterize. There is no hydronephrosis on either side. The visualized ureters and urinary bladder appear unremarkable. Stomach/Bowel: There is moderate stool throughout the colon. There is mild thickening of the descending and sigmoid colon which may be related to underdistention. Colitis is not excluded. Clinical correlation is recommended. There is no bowel obstruction. The appendix is normal. Lymphatic: No adenopathy. Reproductive: The prostate and seminal vesicles are grossly unremarkable. No pelvic mass. Other: None Musculoskeletal: Lower lumbar posterior fusion. No acute osseous pathology. Review of the MIP images confirms the above findings. IMPRESSION: 1. No aortic dissection. 2. Small pneumoperitoneum of indeterminate etiology but concerning for bowel perforation. 3. Underdistention versus possible mild colitis of the distal colon. No bowel obstruction. Normal appendix. 4. Small left pleural effusion and left lung base atelectasis or infiltrate. 5. Focal low density in the right hilum may be extravascular and  represent hilar lymph nodes. The possibility of pulmonary embolism is not entirely excluded. Dedicated CTA or V/Q scan may provide better evaluation if there is high clinical concern for acute PE. 6. A 3 cm infrarenal abdominal aortic aneurysm. Recommend follow-up ultrasound every 3 years. These results were called by telephone at the time of interpretation on 05/16/2022 at 8:45 pm to provider MADISON [facility] , who verbally acknowledged these results. Electronically Signed   By: Anner Crete M.D.   On: 05/12/2022 20:47    Review of Systems  Constitutional:  Positive for appetite change and fatigue.  HENT: Negative.    Eyes: Negative.   Respiratory:  Positive for shortness of breath.   Cardiovascular:  Positive for chest pain.  Gastrointestinal:  Positive for abdominal pain and constipation.  Endocrine: Negative.   Genitourinary: Negative.   Musculoskeletal: Negative.   Skin: Negative.   Allergic/Immunologic: Negative.   Neurological: Negative.   Hematological: Negative.   Psychiatric/Behavioral: Negative.  Blood pressure 99/72, pulse (!) 128, temperature 98.1 F (36.7 C), temperature source Oral, resp. rate (!) 30, height 6\' 1"  (1.854 m), weight 92.4 kg, SpO2 94 %. Physical Exam Constitutional:      Appearance: He is ill-appearing.  Eyes:     Pupils: Pupils are equal, round, and reactive to light.  Cardiovascular:     Rate and Rhythm: Regular rhythm. Tachycardia present.  Pulmonary:     Effort: Tachypnea present.  Chest:     Comments: Port in place left side Abdominal:     Palpations: Abdomen is soft.     Tenderness: There is abdominal tenderness. There is no guarding or rebound.     Comments: Some tenderness but no peritonitis  Musculoskeletal:     Cervical back: Neck supple.     Comments: Calves are soft  Skin:    Capillary Refill: Capillary refill takes more than 3 seconds.  Neurological:     Mental Status: He is alert.  Psychiatric:        Mood and Affect: Mood  normal.     Assessment/Plan: Small pneumoperitoneum -in light of severe pancytopenia I do not feel he would survive an operation right now.  With a small amount of pneumoperitoneum, it is uncertain if the etiology would be very apparent.  Agree with admission by CCM, antibiotics, bowel rest.  I discussed his care with Dr. Tamala Julian. We will follow.  I discussed the plan with him and his wife.  Sepsis  Respiratory failure  Severe pancytopenia  Stage IIb squamous cell lung cancer  Zenovia Jarred 05/29/2022, 11:06 PM

## 2022-05-12 NOTE — ED Notes (Signed)
CN notified that bed is needed for pt.  Diaphoretic.  EKG being done

## 2022-05-13 ENCOUNTER — Inpatient Hospital Stay (HOSPITAL_COMMUNITY): Payer: PPO

## 2022-05-13 ENCOUNTER — Other Ambulatory Visit: Payer: Self-pay

## 2022-05-13 DIAGNOSIS — D696 Thrombocytopenia, unspecified: Secondary | ICD-10-CM | POA: Insufficient documentation

## 2022-05-13 DIAGNOSIS — K7200 Acute and subacute hepatic failure without coma: Secondary | ICD-10-CM | POA: Insufficient documentation

## 2022-05-13 DIAGNOSIS — Z9911 Dependence on respirator [ventilator] status: Secondary | ICD-10-CM

## 2022-05-13 DIAGNOSIS — E872 Acidosis, unspecified: Secondary | ICD-10-CM | POA: Insufficient documentation

## 2022-05-13 DIAGNOSIS — A419 Sepsis, unspecified organism: Secondary | ICD-10-CM | POA: Diagnosis not present

## 2022-05-13 DIAGNOSIS — N179 Acute kidney failure, unspecified: Secondary | ICD-10-CM | POA: Insufficient documentation

## 2022-05-13 DIAGNOSIS — R6521 Severe sepsis with septic shock: Secondary | ICD-10-CM

## 2022-05-13 DIAGNOSIS — D709 Neutropenia, unspecified: Secondary | ICD-10-CM | POA: Insufficient documentation

## 2022-05-13 DIAGNOSIS — J9601 Acute respiratory failure with hypoxia: Secondary | ICD-10-CM | POA: Insufficient documentation

## 2022-05-13 DIAGNOSIS — Z66 Do not resuscitate: Secondary | ICD-10-CM | POA: Insufficient documentation

## 2022-05-13 DIAGNOSIS — Z515 Encounter for palliative care: Secondary | ICD-10-CM

## 2022-05-13 LAB — POCT I-STAT 7, (LYTES, BLD GAS, ICA,H+H)
Acid-base deficit: 10 mmol/L — ABNORMAL HIGH (ref 0.0–2.0)
Acid-base deficit: 11 mmol/L — ABNORMAL HIGH (ref 0.0–2.0)
Acid-base deficit: 12 mmol/L — ABNORMAL HIGH (ref 0.0–2.0)
Bicarbonate: 13.9 mmol/L — ABNORMAL LOW (ref 20.0–28.0)
Bicarbonate: 14.8 mmol/L — ABNORMAL LOW (ref 20.0–28.0)
Bicarbonate: 16.1 mmol/L — ABNORMAL LOW (ref 20.0–28.0)
Calcium, Ion: 1.03 mmol/L — ABNORMAL LOW (ref 1.15–1.40)
Calcium, Ion: 1.09 mmol/L — ABNORMAL LOW (ref 1.15–1.40)
Calcium, Ion: 1.21 mmol/L (ref 1.15–1.40)
HCT: 25 % — ABNORMAL LOW (ref 39.0–52.0)
HCT: 28 % — ABNORMAL LOW (ref 39.0–52.0)
HCT: 31 % — ABNORMAL LOW (ref 39.0–52.0)
Hemoglobin: 10.5 g/dL — ABNORMAL LOW (ref 13.0–17.0)
Hemoglobin: 8.5 g/dL — ABNORMAL LOW (ref 13.0–17.0)
Hemoglobin: 9.5 g/dL — ABNORMAL LOW (ref 13.0–17.0)
O2 Saturation: 79 %
O2 Saturation: 96 %
O2 Saturation: 96 %
Patient temperature: 102
Patient temperature: 97.6
Patient temperature: 98.6
Potassium: 4.2 mmol/L (ref 3.5–5.1)
Potassium: 4.2 mmol/L (ref 3.5–5.1)
Potassium: 4.3 mmol/L (ref 3.5–5.1)
Sodium: 129 mmol/L — ABNORMAL LOW (ref 135–145)
Sodium: 130 mmol/L — ABNORMAL LOW (ref 135–145)
Sodium: 130 mmol/L — ABNORMAL LOW (ref 135–145)
TCO2: 15 mmol/L — ABNORMAL LOW (ref 22–32)
TCO2: 16 mmol/L — ABNORMAL LOW (ref 22–32)
TCO2: 17 mmol/L — ABNORMAL LOW (ref 22–32)
pCO2 arterial: 26.8 mmHg — ABNORMAL LOW (ref 32–48)
pCO2 arterial: 34.1 mmHg (ref 32–48)
pCO2 arterial: 38.4 mmHg (ref 32–48)
pH, Arterial: 7.24 — ABNORMAL LOW (ref 7.35–7.45)
pH, Arterial: 7.245 — ABNORMAL LOW (ref 7.35–7.45)
pH, Arterial: 7.322 — ABNORMAL LOW (ref 7.35–7.45)
pO2, Arterial: 104 mmHg (ref 83–108)
pO2, Arterial: 44 mmHg — ABNORMAL LOW (ref 83–108)
pO2, Arterial: 93 mmHg (ref 83–108)

## 2022-05-13 LAB — COMPREHENSIVE METABOLIC PANEL
ALT: 40 U/L (ref 0–44)
AST: 43 U/L — ABNORMAL HIGH (ref 15–41)
Albumin: 2.8 g/dL — ABNORMAL LOW (ref 3.5–5.0)
Alkaline Phosphatase: 84 U/L (ref 38–126)
Anion gap: 15 (ref 5–15)
BUN: 34 mg/dL — ABNORMAL HIGH (ref 8–23)
CO2: 17 mmol/L — ABNORMAL LOW (ref 22–32)
Calcium: 8.3 mg/dL — ABNORMAL LOW (ref 8.9–10.3)
Chloride: 99 mmol/L (ref 98–111)
Creatinine, Ser: 2.01 mg/dL — ABNORMAL HIGH (ref 0.61–1.24)
GFR, Estimated: 36 mL/min — ABNORMAL LOW (ref >60)
Glucose, Bld: 104 mg/dL — ABNORMAL HIGH (ref 70–99)
Potassium: 3.9 mmol/L (ref 3.5–5.1)
Sodium: 131 mmol/L — ABNORMAL LOW (ref 135–145)
Total Bilirubin: 3.9 mg/dL — ABNORMAL HIGH (ref 0.3–1.2)
Total Protein: 5.6 g/dL — ABNORMAL LOW (ref 6.5–8.1)

## 2022-05-13 LAB — GLUCOSE, CAPILLARY
Glucose-Capillary: 80 mg/dL (ref 70–99)
Glucose-Capillary: 77 mg/dL (ref 70–99)

## 2022-05-13 LAB — CBC
HCT: 33.6 % — ABNORMAL LOW (ref 39.0–52.0)
Hemoglobin: 11.2 g/dL — ABNORMAL LOW (ref 13.0–17.0)
MCH: 30.5 pg (ref 26.0–34.0)
MCHC: 33.3 g/dL (ref 30.0–36.0)
MCV: 91.6 fL (ref 80.0–100.0)
Platelets: 30 10*3/uL — ABNORMAL LOW (ref 150–400)
RBC: 3.67 MIL/uL — ABNORMAL LOW (ref 4.22–5.81)
RDW: 12.4 % (ref 11.5–15.5)
WBC: 0.2 10*3/uL — CL (ref 4.0–10.5)
nRBC: 0 % (ref 0.0–0.2)

## 2022-05-13 LAB — BPAM PLATELET PHERESIS
Blood Product Expiration Date: 202404042359
ISSUE DATE / TIME: 202404012113
Unit Type and Rh: 5100

## 2022-05-13 LAB — LACTIC ACID, PLASMA
Lactic Acid, Venous: 6 mmol/L (ref 0.5–1.9)
Lactic Acid, Venous: 8 mmol/L (ref 0.5–1.9)

## 2022-05-13 LAB — HEMOGLOBIN A1C
Hgb A1c MFr Bld: 5.3 % (ref 4.8–5.6)
Mean Plasma Glucose: 105 mg/dL

## 2022-05-13 LAB — PREPARE PLATELET PHERESIS: Unit division: 0

## 2022-05-13 LAB — MRSA NEXT GEN BY PCR, NASAL: MRSA by PCR Next Gen: NOT DETECTED

## 2022-05-13 MED ORDER — FENTANYL CITRATE PF 50 MCG/ML IJ SOSY
PREFILLED_SYRINGE | INTRAMUSCULAR | Status: AC
  Filled 2022-05-13: qty 2

## 2022-05-13 MED ORDER — GLYCOPYRROLATE 0.2 MG/ML IJ SOLN: 0.2000 mg | INTRAMUSCULAR | Status: DC | PRN

## 2022-05-13 MED ORDER — GLYCOPYRROLATE 1 MG PO TABS: 1.0000 mg | ORAL_TABLET | ORAL | Status: DC | PRN

## 2022-05-13 MED ORDER — DOCUSATE SODIUM 50 MG/5ML PO LIQD: 100.0000 mg | Freq: Two times a day (BID) | ORAL | Status: DC

## 2022-05-13 MED ORDER — ALBUTEROL SULFATE (2.5 MG/3ML) 0.083% IN NEBU: 2.5000 mg | INHALATION_SOLUTION | RESPIRATORY_TRACT | Status: DC | PRN

## 2022-05-13 MED ORDER — PROCHLORPERAZINE EDISYLATE 10 MG/2ML IJ SOLN
5.0000 mg | Freq: Once | INTRAMUSCULAR | Status: AC
  Administered 2022-05-13: 5 mg via INTRAVENOUS
  Filled 2022-05-13: qty 1

## 2022-05-13 MED ORDER — ACETAMINOPHEN 650 MG RE SUPP: 650.0000 mg | Freq: Four times a day (QID) | RECTAL | Status: DC | PRN

## 2022-05-13 MED ORDER — MIDAZOLAM HCL 2 MG/2ML IJ SOLN
INTRAMUSCULAR | Status: AC
  Administered 2022-05-13: 1 mg via INTRAVENOUS
  Filled 2022-05-13: qty 2

## 2022-05-13 MED ORDER — FENTANYL 2500MCG IN NS 250ML (10MCG/ML) PREMIX INFUSION
25.0000 ug/h | INTRAVENOUS | Status: DC
  Administered 2022-05-13: 50 ug/h via INTRAVENOUS
  Filled 2022-05-13: qty 250

## 2022-05-13 MED ORDER — EPINEPHRINE HCL 5 MG/250ML IV SOLN IN NS
0.5000 ug/min | INTRAVENOUS | Status: DC
  Administered 2022-05-13: 2 ug/min via INTRAVENOUS

## 2022-05-13 MED ORDER — FENTANYL CITRATE PF 50 MCG/ML IJ SOSY
100.0000 ug | PREFILLED_SYRINGE | Freq: Once | INTRAMUSCULAR | Status: AC
  Administered 2022-05-13: 100 ug via INTRAVENOUS

## 2022-05-13 MED ORDER — ACETAMINOPHEN 325 MG PO TABS: 650.0000 mg | ORAL_TABLET | Freq: Four times a day (QID) | ORAL | Status: DC | PRN

## 2022-05-13 MED ORDER — EPINEPHRINE HCL 5 MG/250ML IV SOLN IN NS
INTRAVENOUS | Status: AC
  Filled 2022-05-13: qty 250

## 2022-05-13 MED ORDER — ROCURONIUM BROMIDE 10 MG/ML (PF) SYRINGE
PREFILLED_SYRINGE | INTRAVENOUS | Status: AC
  Administered 2022-05-13: 100 mg via INTRAVENOUS
  Filled 2022-05-13: qty 10

## 2022-05-13 MED ORDER — ORAL CARE MOUTH RINSE: 15.0000 mL | OROMUCOSAL | Status: DC | PRN

## 2022-05-13 MED ORDER — MIDAZOLAM-SODIUM CHLORIDE 100-0.9 MG/100ML-% IV SOLN
0.0000 mg/h | INTRAVENOUS | Status: DC
  Administered 2022-05-13: 1 mg/h via INTRAVENOUS
  Filled 2022-05-13: qty 100

## 2022-05-13 MED ORDER — HYDROMORPHONE HCL 1 MG/ML IJ SOLN: 0.5000 mg | INTRAMUSCULAR | Status: DC | PRN

## 2022-05-13 MED ORDER — LORAZEPAM 2 MG/ML IJ SOLN
0.5000 mg | Freq: Four times a day (QID) | INTRAMUSCULAR | Status: DC | PRN
  Administered 2022-05-13: 0.5 mg via INTRAVENOUS
  Filled 2022-05-13: qty 1

## 2022-05-13 MED ORDER — MIDAZOLAM HCL 2 MG/2ML IJ SOLN: 2.0000 mg | INTRAMUSCULAR | Status: DC | PRN

## 2022-05-13 MED ORDER — POLYVINYL ALCOHOL 1.4 % OP SOLN: 1.0000 [drp] | Freq: Four times a day (QID) | OPHTHALMIC | Status: DC | PRN

## 2022-05-13 MED ORDER — MIDAZOLAM HCL 2 MG/2ML IJ SOLN: 1.0000 mg | INTRAMUSCULAR | Status: DC | PRN

## 2022-05-13 MED ORDER — ORAL CARE MOUTH RINSE
15.0000 mL | OROMUCOSAL | Status: DC
  Administered 2022-05-13: 15 mL via OROMUCOSAL

## 2022-05-13 MED ORDER — POLYETHYLENE GLYCOL 3350 17 G PO PACK: 17.0000 g | PACK | Freq: Every day | ORAL | Status: DC

## 2022-05-13 MED ORDER — MIDAZOLAM HCL 2 MG/2ML IJ SOLN: 1.0000 mg | Freq: Once | INTRAMUSCULAR | Status: AC

## 2022-05-13 MED ORDER — SODIUM CHLORIDE 0.9 % IV SOLN: INTRAVENOUS | Status: DC

## 2022-05-13 MED ORDER — FENTANYL CITRATE PF 50 MCG/ML IJ SOSY: 25.0000 ug | PREFILLED_SYRINGE | Freq: Once | INTRAMUSCULAR | Status: DC

## 2022-05-13 MED ORDER — ETOMIDATE 2 MG/ML IV SOLN
INTRAVENOUS | Status: AC
  Administered 2022-05-13: 20 mg via INTRAVENOUS
  Filled 2022-05-13: qty 20

## 2022-05-13 MED ORDER — ETOMIDATE 2 MG/ML IV SOLN: 20.0000 mg | Freq: Once | INTRAVENOUS | Status: AC

## 2022-05-13 MED ORDER — PANTOPRAZOLE SODIUM 40 MG IV SOLR
40.0000 mg | Freq: Every day | INTRAVENOUS | Status: DC
  Administered 2022-05-13: 40 mg via INTRAVENOUS
  Filled 2022-05-13: qty 10

## 2022-05-13 MED ORDER — FAMOTIDINE 20 MG PO TABS
20.0000 mg | ORAL_TABLET | Freq: Two times a day (BID) | ORAL | Status: DC
Start: 2022-05-13 — End: 2022-05-13

## 2022-05-13 MED ORDER — MIDAZOLAM BOLUS VIA INFUSION: 0.0000 mg | INTRAVENOUS | Status: DC | PRN

## 2022-05-13 MED ORDER — SODIUM CHLORIDE 0.9% IV SOLUTION: Freq: Once | INTRAVENOUS | Status: AC

## 2022-05-13 MED ORDER — NOREPINEPHRINE 4 MG/250ML-% IV SOLN
0.0000 ug/min | INTRAVENOUS | Status: DC
  Administered 2022-05-13 (×2): 40 ug/min via INTRAVENOUS
  Administered 2022-05-13: 15 ug/min via INTRAVENOUS
  Administered 2022-05-13: 21 ug/min via INTRAVENOUS
  Administered 2022-05-13: 20 ug/min via INTRAVENOUS
  Filled 2022-05-13 (×4): qty 250

## 2022-05-13 MED ORDER — PHENYLEPHRINE 80 MCG/ML (10ML) SYRINGE FOR IV PUSH (FOR BLOOD PRESSURE SUPPORT)
PREFILLED_SYRINGE | INTRAVENOUS | Status: AC
  Administered 2022-05-13: 160 ug via INTRAVENOUS
  Filled 2022-05-13: qty 20

## 2022-05-13 MED ORDER — FENTANYL BOLUS VIA INFUSION: 25.0000 ug | INTRAVENOUS | Status: DC | PRN

## 2022-05-13 MED ORDER — SUCCINYLCHOLINE CHLORIDE 200 MG/10ML IV SOSY
PREFILLED_SYRINGE | INTRAVENOUS | Status: AC
  Filled 2022-05-13: qty 10

## 2022-05-13 MED ORDER — ROCURONIUM BROMIDE 10 MG/ML (PF) SYRINGE: 100.0000 mg | PREFILLED_SYRINGE | Freq: Once | INTRAVENOUS | Status: AC

## 2022-05-13 MED ORDER — ORAL CARE MOUTH RINSE
15.0000 mL | OROMUCOSAL | Status: DC
Start: 2022-05-13 — End: 2022-05-13

## 2022-05-14 LAB — BPAM PLATELET PHERESIS
Blood Product Expiration Date: 202404022359
ISSUE DATE / TIME: 202404020058
Unit Type and Rh: 6200

## 2022-05-14 LAB — TYPE AND SCREEN
ABO/RH(D): B NEG
Antibody Screen: NEGATIVE
Unit division: 0
Unit division: 0
Unit division: 0

## 2022-05-14 LAB — PREPARE PLATELET PHERESIS: Unit division: 0

## 2022-05-14 LAB — BPAM RBC
Blood Product Expiration Date: 202404182359
Blood Product Expiration Date: 202405052359
Blood Product Expiration Date: 202405062359
Unit Type and Rh: 1700
Unit Type and Rh: 1700
Unit Type and Rh: 9500

## 2022-05-16 MED FILL — Morphine Sulfate Inj 4 MG/ML: INTRAMUSCULAR | Qty: 1 | Status: AC

## 2022-05-17 LAB — CULTURE, BLOOD (ROUTINE X 2)
Culture: NO GROWTH
Culture: NO GROWTH
Special Requests: ADEQUATE
Special Requests: ADEQUATE

## 2022-05-22 ENCOUNTER — Other Ambulatory Visit: Payer: PPO

## 2022-05-22 ENCOUNTER — Ambulatory Visit: Payer: PPO

## 2022-05-22 ENCOUNTER — Ambulatory Visit: Payer: PPO | Admitting: Hematology

## 2022-05-29 ENCOUNTER — Ambulatory Visit: Payer: PPO

## 2022-05-29 ENCOUNTER — Encounter: Payer: PPO | Admitting: Dietician

## 2022-05-29 ENCOUNTER — Other Ambulatory Visit: Payer: PPO

## 2022-06-11 ENCOUNTER — Ambulatory Visit: Payer: PPO | Admitting: Physical Medicine & Rehabilitation

## 2022-06-11 NOTE — Progress Notes (Signed)
Pt extubated to RA per withdrawal of care protocol

## 2022-06-11 NOTE — Death Summary Note (Signed)
  DEATH SUMMARY    Name: Juan Yang MRN: [id number] DOB: 1955/10/05 67 y.o.  Date of Admission: 06/05/2022  6:48 PM Date of Discharge: 05-26-22 Attending Physician: Lanier Clam, MD  Discharge Diagnosis: Principal Problem:   Severe sepsis with septic shock Active Problems:   Intestinal perforation   Acute hypoxic respiratory failure   On mechanically assisted ventilation   AKI (acute kidney injury)   Ischemic hepatitis   Lactic acidosis   Severe neutropenia   Squamous cell lung cancer, right   Severe thrombocytopenia   Comfort measures only status   DNR (do not resuscitate)  Cause of death: Asystole Time of death: 2022/05/26 10:47 AM  Disposition and follow-up:   [name] was discharged from [facility] in expired condition.    Hospital Course: Zeshan Woodrum was a 67 year old male living with stage IIb squamous cell lung carcinoma on chemotherapy, IgG lambda MGUS, Charcot-Marie-Tooth type II, known AAA, who presented to the ED with shortness of breath, abdominal pain and constipation, and was admitted for severe shock that require Levophed and vasopressor in the ED.  He was found to be pancytopenic and severely neutropenic.  His last chemotherapy was 05/08/2022 with carboplatin.  He was started on vancomycin, Zosyn and Diflucan empirically and bicarb infusion for severe metabolic acidosis from lactic acidosis.  CTA of chest/abdomen/pelvic showed a new small pneumoperitoneum concerning for bowel perforation in the setting of constipation.  General surgery team was consulted who did not recommended surgical treatment due to his high requirement of vasopressors and severe neutropenia.   Patient was intubated on May 26, 2022 due to increased work of breathing and tachypnea.  He required high dose of Levophed, epinephrine and vasopressor, which unfortunately were unable to maintain his MAP > 65.  Goals of care discussion was made with patient's spouse  and sister at bedside who understood the severity of his illness and poor outcome.  The decision to transition to full comfort care was made.  Signed: Gaylan Gerold, DO 2022/05/26, 11:33 AM

## 2022-06-11 NOTE — Progress Notes (Signed)
This chaplain responded to consult for EOL spiritual care. The chaplain learned the Pt. died before the chaplain's visit with family at the bedside. The chaplain checked in with the unit and provided education for the unit on how to emergently request spiritual care.    Chaplain Sallyanne Kuster [phone]

## 2022-06-11 NOTE — IPAL (Addendum)
  Interdisciplinary Goals of Care Family Meeting   Date carried out:: 06-09-2022  Location of the meeting: Bedside  Member's involved: Physician, Bedside Registered Nurse, and Family Member or next of kin  Durable Power of Attorney or acting medical decision maker: Spouse Juan Yang    Discussion: We discussed goals of care for Juan Yang .  Juan Yang understands that patient is critically ill and requires multiple pressors.  Surgery team thinks that patient would not survive the operation.   Juan Yang agrees with changing CODE STATUS to DNR.  She is waiting for patient's sister to get here to discuss comfort care.   Addendum: Patient's spouse and sister would like to transition to comfort care and compassionate extubation.   Code status: Full DNR  Disposition: In-patient comfort care   Time spent for the meeting: 15 min  Juan Yang 06/09/2022, 10:21 AM

## 2022-06-11 NOTE — Procedures (Signed)
Intubation Procedure Note  Juan Yang  [id number]  Sep 27, 1955  Date:05-24-22  Time:7:57 AM   Provider Performing:Bryan Magnus Crescenzo    Procedure: Intubation (31500)  Indication(s) Respiratory Failure  Consent Risks of the procedure as well as the alternatives and risks of each were explained to the patient and/or caregiver.  Consent for the procedure was obtained and is signed in the bedside chart   Anesthesia Etomidate, Versed, Fentanyl, and Rocuronium   Time Out Verified patient identification, verified procedure, site/side was marked, verified correct patient position, special equipment/implants available, medications/allergies/relevant history reviewed, required imaging and test results available.   Sterile Technique Usual hand hygeine, masks, and gloves were used   Procedure Description Patient positioned in bed supine.  Sedation given as noted above.  Patient was intubated with endotracheal tube using Glidescope.  View was Grade 3 only epiglottis .  Number of attempts was 1.  Colorimetric CO2 detector was consistent with tracheal placement.   Complications/Tolerance None; patient tolerated the procedure well. Chest X-ray is ordered to verify placement.   EBL none   Specimen(s) None  Juan Yang Juan Yang ACNP Acute Care Nurse Practitioner [place] Please consult Amion 2022/05/24, 7:58 AM

## 2022-06-11 NOTE — Progress Notes (Addendum)
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  Workup has revealed pneumoperitoneum and significant pancytopenia.  Transferred down from [facility] for urgent surgical evaluation and resuscitation.    PCCM to admit.  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 rate (!) 46, height 6\' 1"  (1.854 m), weight 95.7 kg, SpO2 100 %.    FiO2 (%):  [95 %] 95 %   Intake/Output Summary (Last 24 hours) at May 16, 2022 0801 Last data filed at 2022-05-16 0600 Gross per 24 hour  Intake 1747.06 ml  Output --  Net 1747.06 ml   Filed Weights   06/07/2022 1856 05/16/22 0323  Weight: 92.4 kg 95.7 kg    Examination: General: Frail appearing male he is in obvious respiratory distress HEENT: MM pink/moist, poor dentition, during intubation old blood noted in oropharynx Neuro: Follows simple commands CV: Tachycardic to 130s 140s PULM: Tachypneic in the 40s Vent pressure regulated volume control FIO2 100% PEEP 6 RATE 24 VT  GI: Orogastric tube in place GU: Condom cath Extremities: warm/dry, 1+ edema  Skin: no rashes or lesions   Resolved Hospital Problem list   N/A  Assessment & Plan:   Presumed perforated viscus Septic shock secondary to above AKI with acidemia Shock liver Chemo induced pancytopenia Stage IIb lung cancer with recent chemotherapy and Acute respiratory failure in the setting of lung cancer  2022/05/16 urgent intubation after discussion with wife. Ventilator bundle Pulmonary culture Continue broad-spectrum antibiotics Continue bicarbonate drip Platelets as needed Surgical consult for Dr. Grandville Silos noted not a surgical candidate at this point Continue supportive measures. Requiring triple vasopressor support          Best Practice (right click and "Reselect all SmartList Selections" daily)   Diet/type: NPO DVT prophylaxis: not indicated GI prophylaxis: PPI Lines: yes and it is still needed Foley:  N/A Code Status:  full code Last date of multidisciplinary goals of care discussion May 16, 2022 per Dr. Silas Flood with the patient's spouse Hughes Krapp who requested full interventions.  Labs   CBC: Recent Labs  Lab 05/08/22 0841 06/08/2022 1901 05/14/2022 1955 06/05/2022 2357 05-16-2022 0215 16-May-2022 0519  WBC 3.3* 0.2*  --   --  0.2*  --   NEUTROABS 1.7 0.1*  --   --   --   --   HGB 12.8* 12.5* 11.9* 9.5* 11.2* 8.5*  HCT 37.6* 36.0* 35.0* 28.0* 33.6* 25.0*  MCV 89.7 87.8  --   --  91.6  --   PLT 110* 23*  --   --  30*  --     Basic Metabolic Panel: Recent Labs  Lab 05/08/22 0841 05/26/2022 1901 06/10/2022 1955 06/03/2022 2357 05-16-2022 0215 2022/05/16 0519  NA 133* 128* 133* 130* 131* 130*  K 3.8 4.8 4.6 4.3 3.9 4.2  CL 103 98 100  --  99  --   CO2 19* 20*  --   --  17*  --   GLUCOSE 104* 143* 124*  --  104*  --   BUN 25* 35* 32*  --  34*  --   CREATININE 1.12 1.89* 2.00*  --  2.01*  --   CALCIUM 8.8* 8.5*  --   --  8.3*  --   MG 2.0  --   --   --   --   --    GFR: Estimated Creatinine Clearance: 40.9 mL/min (A) (by C-G formula based on SCr of 2.01 mg/dL (H)). Recent Labs  Lab 05/08/22 0841 05/28/2022 1901 05/22/2022 2112 23-May-2022 0215  05/23/22 0645  WBC 3.3* 0.2*  --  0.2*  --   LATICACIDVEN  --   --  6.7* 6.0* 8.0*    Liver Function Tests: Recent Labs  Lab 05/08/22 0841 06/10/2022 1901 05/23/22 0215  AST 30 42* 43*  ALT 54* 49* 40  ALKPHOS 100 100 84  BILITOT 0.6 3.2* 3.9*  PROT 6.9 6.8 5.6*  ALBUMIN 3.7 3.3* 2.8*   Recent Labs  Lab 05/20/2022 1901  LIPASE 22   No results for input(s): "AMMONIA" in the last 168 hours.  ABG    Component Value Date/Time   PHART 7.322 (L) 05/23/2022 0519   PCO2ART 26.8 (L) 05-23-2022 0519   PO2ART 44 (L) 2022-05-23 0519   HCO3 13.9 (L) 05-23-22 0519   TCO2 15 (L) 05/23/2022 0519   ACIDBASEDEF 11.0 (H) 2022/05/23 0519   O2SAT 79 05/23/22 0519     Coagulation Profile: Recent Labs  Lab 06/09/2022 2248  INR 1.5*    Cardiac Enzymes: No results for input(s): "CKTOTAL", "CKMB", "CKMBINDEX", "TROPONINI" in the last 168 hours.  HbA1C: No results found for: "HGBA1C"  CBG: Recent Labs  Lab 06/07/2022 1946 05/16/2022 2339 23-May-2022 0331  GLUCAP 134* 123* 80      Critical care time: 45 mins independent of procedures    Richardson Landry Minor ACNP Acute Care Nurse Practitioner [place] Please consult [place] 23-May-2022, 8:01 AM  Critical care attending attestation note:  Patient seen and examined and relevant ancillary tests reviewed.  I agree with the assessment and plan of care as outlined by Gaylyn Lambert, NP.   67 year old man currently regarding neoadjuvant chemotherapy for lung cancer presents with abdominal pain and shortness of breath found to have pneumoperitoneum concern for bowel perforation.  Surgery recommended nonoperative management, medical management given severity of illness and likely to die on the table.  Unfortunate he developed worsening respiratory distress.  Intubated this morning.  Multiple pressor shock.  Do not think he will survive with medical management.  Shared with his wife via phone.  She is coming in.  Would recommend  comfort care when she arrives.  Will do all we can to keep him alive for now.  Antibiotic, vasopressors, ventilator support, life support.  Synopsis of assessment and plan:  Acute hypoxemic respiratory failure: Due to neuromuscular weakness in the setting of high respiratory rate due to metabolic acidosis.  Basically tired out.  Chest imaging relatively clear with the exception of chronic cancer changes. -- PRVC, VAP bundle, stress ulcer prophylaxis -- Wean FiO2 and PEEP as able  Septic shock: Due to bowel perforation. -- Norepinephrine, vasopressin, add epinephrine given low blood pressure, MAP goal greater than 65 -- Continue cefepime Flagyl fluconazole, received vancomycin already, likely  unneeded given intra-abdominal source  Renal failure: Due to shock, ATN. -- Continue fluid resuscitation as able, vasopressor support as above  Pancytopenia: Related to chemotherapy regimen, largely due to platinum based chemotherapy. -- Granix daily until A1c greater than 1500, transfuse platelets for greater than 20, hemoglobin greater than 7  CRITICAL CARE Performed by: Lanier Clam   Total critical care time: 45 minutes  Critical care time was exclusive of separately billable procedures and treating other patients.  Critical care was necessary to treat or prevent imminent or life-threatening deterioration.  Critical care was time spent personally by me on the following activities: development of treatment plan with patient and/or surrogate as well as nursing, discussions with consultants, evaluation of patient's response to treatment, examination of patient, obtaining history from patient or surrogate, ordering and performing treatments and interventions, ordering and review of laboratory studies, ordering and review of radiographic studies, pulse oximetry, re-evaluation of patient's condition and participation in multidisciplinary rounds.  Larey Days, MD  ICU Physician [place] [facility]  See Amion for pager info  06/04/2022, 12:01 PM

## 2022-06-11 NOTE — Progress Notes (Signed)
Progress Note     Subjective: Intubated and sedated. On 3 pressors.   Objective: Vital signs in last 24 hours: Temp:  [97.6 F (36.4 C)-101.6 F (38.7 C)] 101.6 F (38.7 C) 05/28/2022 0830) Pulse Rate:  [110-149] 128 May 28, 2022 0700) Resp:  [18-53] 46 05/28/22 0700) BP: (72-152)/(13-108) 116/100 2022/05/28 0700) SpO2:  [79 %-100 %] 100 % May 28, 2022 0757) FiO2 (%):  [95 %] 95 % 2022/05/28 0550) Weight:  [92.4 kg-95.7 kg] 95.7 kg 05/28/2022 0323)    Intake/Output from previous day: 04/01 0701 - 28-May-2022 0700 In: 1747.1 [I.V.:1179.4; Blood:266; IV Piggyback:301.7] Out: -  Intake/Output this shift: No intake/output data recorded.  PE: General: WD, ill appearing male Heart: tachycardia with rate in the 120s Lungs: intubated  Abd: soft, mild distention, OGT clamped, few BS, no grimace on palpation    Lab Results:  Recent Labs    05/30/2022 1901 06/10/2022 1955 May 28, 2022 0215 05/28/22 0519  WBC 0.2*  --  0.2*  --   HGB 12.5*   < > 11.2* 8.5*  HCT 36.0*   < > 33.6* 25.0*  PLT 23*  --  30*  --    < > = values in this interval not displayed.   BMET Recent Labs    05/17/2022 1901 05/17/2022 1955 06/01/2022 2357 05/28/22 0215 2022-05-28 0519  NA 128* 133*   < > 131* 130*  K 4.8 4.6   < > 3.9 4.2  CL 98 100  --  99  --   CO2 20*  --   --  17*  --   GLUCOSE 143* 124*  --  104*  --   BUN 35* 32*  --  34*  --   CREATININE 1.89* 2.00*  --  2.01*  --   CALCIUM 8.5*  --   --  8.3*  --    < > = values in this interval not displayed.   PT/INR Recent Labs    05/28/2022 2248  LABPROT 17.6*  INR 1.5*   CMP     Component Value Date/Time   NA 130 (L) May 28, 2022 0519   NA 136 12/12/2021 0926   K 4.2 28-May-2022 0519   CL 99 May 28, 2022 0215   CO2 17 (L) May 28, 2022 0215   GLUCOSE 104 (H) May 28, 2022 0215   BUN 34 (H) 2022/05/28 0215   BUN 11 12/12/2021 0926   CREATININE 2.01 (H) 05-28-22 0215   CREATININE 1.64 (H) 04/11/2020 1001   CALCIUM 8.3 (L) 05-28-22 0215   PROT 5.6 (L) 05/28/22 0215    PROT 6.4 12/12/2021 0926   ALBUMIN 2.8 (L) 28-May-2022 0215   ALBUMIN 4.1 12/12/2021 0926   AST 43 (H) 05-28-22 0215   ALT 40 2022/05/28 0215   ALKPHOS 84 2022-05-28 0215   BILITOT 3.9 (H) 28-May-2022 0215   BILITOT 1.2 12/12/2021 0926   GFRNONAA 36 (L) 28-May-2022 0215   GFRNONAA 44 (L) 04/11/2020 1001   GFRAA 50 (L) 04/11/2020 1001   Lipase     Component Value Date/Time   LIPASE 22 05/12/2022 1901       Studies/Results: DG Abd Portable 1V  Result Date: 28-May-2022 CLINICAL DATA:  NG tube placement EXAM: PORTABLE ABDOMEN - 1 VIEW COMPARISON:  06/16/2018 FINDINGS: Tip and side port in NG tube are seen in region of body of stomach. Bowel gas pattern is nonspecific. Surgical clips are seen in the right mid abdomen. There is surgical fusion in lower lumbar spine. IMPRESSION: Tip of NG tube is seen in the stomach.  Electronically Signed   By: Elmer Picker M.D.   On: 2022-06-10 08:22   DG CHEST PORT 1 VIEW  Result Date: 2022-06-10 CLINICAL DATA:  Difficulty breathing, status post intubation EXAM: PORTABLE CHEST 1 VIEW COMPARISON:  Previous studies including the examination done earlier today FINDINGS: There is interval placement of endotracheal tube with its tip 4.7 cm above the carina. NG tube is noted traversing the esophagus. Tip of left chest port is seen in the region of superior vena cava. There are no signs of alveolar pulmonary edema. Increased markings are seen in both lower lung fields, more so on the left side. There is blunting of left lateral CP angle. There is no pneumothorax. IMPRESSION: Increased markings are seen in both lower lung fields, more so on the left side suggesting atelectasis/pneumonia. Small left pleural effusion. Electronically Signed   By: Elmer Picker M.D.   On: June 10, 2022 08:20   DG Chest Port 1 View  Result Date: 10-Jun-2022 CLINICAL DATA:  Hypoxia, dyspnea EXAM: PORTABLE CHEST 1 VIEW COMPARISON:  11/29/2021 FINDINGS: Lung volumes are small and  pulmonary insufflation has decreased since prior examination. Small left pleural effusion with associated left basilar atelectasis is present. A linear radiodensity is seen along the superolateral aspect of the left upper lobe, however, I suspect this represents overlying dressing material or a skin fold as there are pulmonary marking seen peripheral to this; no definite pneumothorax. No pleural effusion on the right. Cardiac size within normal limits. Left internal jugular chest port tip noted within the superior vena cava. No acute bone abnormality. IMPRESSION: 1. Pulmonary hypoinflation. 2. Small left pleural effusion with associated left basilar atelectasis. Electronically Signed   By: Fidela Salisbury M.D.   On: 06-10-2022 00:30   CT Angio Chest/Abd/Pel for Dissection W and/or Wo Contrast  Result Date: 05/16/2022 CLINICAL DATA:  Acute aortic syndrome suspected. EXAM: CT ANGIOGRAPHY CHEST, ABDOMEN AND PELVIS TECHNIQUE: Non-contrast CT of the chest was initially obtained. Multidetector CT imaging through the chest, abdomen and pelvis was performed using the standard protocol during bolus administration of intravenous contrast. Multiplanar reconstructed images and MIPs were obtained and reviewed to evaluate the vascular anatomy. RADIATION DOSE REDUCTION: This exam was performed according to the departmental dose-optimization program which includes automated exposure control, adjustment of the mA and/or kV according to patient size and/or use of iterative reconstruction technique. CONTRAST:  46mL OMNIPAQUE IOHEXOL 350 MG/ML SOLN COMPARISON:  Chest CT dated 11/29/2021. FINDINGS: Evaluation of this exam is limited due to respiratory motion artifact as well as due to suboptimal opacification and timing of the contrast. CTA CHEST FINDINGS Cardiovascular: There is no cardiomegaly. Trace pericardial effusion. Coronary vascular calcification of the LAD. Mild atherosclerotic calcification of the thoracic aorta. No  aneurysmal dilatation or dissection. The origins of the great vessels of the aortic arch appear patent. Left-sided Port-A-Cath with tip in the central SVC. Focal low density in the right hilum and branching of the right main pulmonary artery (47/4 and 53/4) may be extravascular and represent hilar lymph nodes. The possibility of pulmonary embolism is not entirely excluded clinical correlation is recommended. Dedicated CTA or V/Q scan may provide better evaluation if there is high clinical concern for acute PE. Mediastinum/Nodes: No hilar or mediastinal adenopathy. Fluid noted within the esophagus which may represent reflux or delayed clearance. No mediastinal fluid collection. Lungs/Pleura: Small left pleural effusion and left lung base atelectasis or infiltrate. The right lung is clear. There is no pneumothorax. The central airways are patent. Musculoskeletal: No  acute osseous pathology. Review of the MIP images confirms the above findings. CTA ABDOMEN AND PELVIS FINDINGS VASCULAR Aorta: Mild atherosclerotic calcification of the abdominal aorta. No aortic dissection. There is a 3 cm infrarenal abdominal aortic aneurysm. No periaortic fluid collection. Celiac: The celiac axis and its major branches are patent. SMA: The SMA is patent. Renals: The renal arteries are patent. IMA: The IMA is patent. Inflow: Mild atherosclerotic calcification of the iliac arteries. The iliac arteries are patent. No dissection. Dilated right common iliac artery measures 18 mm in diameter. Veins: The IVC is unremarkable.  No portal venous gas. Review of the MIP images confirms the above findings. NON-VASCULAR Small pneumoperitoneum in the upper abdomen of indeterminate etiology but concerning for bowel perforation. Clinical correlation and surgical consult is advised. Small free fluid in the pelvis. Hepatobiliary: The liver is unremarkable. No biliary ductal dilatation. Cholecystectomy. Pancreas: Unremarkable. No pancreatic ductal  dilatation or surrounding inflammatory changes. Spleen: Normal in size without focal abnormality. Adrenals/Urinary Tract: The adrenal glands unremarkable. Small bilateral renal cysts and additional subcentimeter hypodense lesions which are too small to characterize. There is no hydronephrosis on either side. The visualized ureters and urinary bladder appear unremarkable. Stomach/Bowel: There is moderate stool throughout the colon. There is mild thickening of the descending and sigmoid colon which may be related to underdistention. Colitis is not excluded. Clinical correlation is recommended. There is no bowel obstruction. The appendix is normal. Lymphatic: No adenopathy. Reproductive: The prostate and seminal vesicles are grossly unremarkable. No pelvic mass. Other: None Musculoskeletal: Lower lumbar posterior fusion. No acute osseous pathology. Review of the MIP images confirms the above findings. IMPRESSION: 1. No aortic dissection. 2. Small pneumoperitoneum of indeterminate etiology but concerning for bowel perforation. 3. Underdistention versus possible mild colitis of the distal colon. No bowel obstruction. Normal appendix. 4. Small left pleural effusion and left lung base atelectasis or infiltrate. 5. Focal low density in the right hilum may be extravascular and represent hilar lymph nodes. The possibility of pulmonary embolism is not entirely excluded. Dedicated CTA or V/Q scan may provide better evaluation if there is high clinical concern for acute PE. 6. A 3 cm infrarenal abdominal aortic aneurysm. Recommend follow-up ultrasound every 3 years. These results were called by telephone at the time of interpretation on 06/02/2022 at 8:45 pm to provider [facility] , who verbally acknowledged these results. Electronically Signed   By: Anner Crete M.D.   On: 05/26/2022 20:47    Anti-infectives: Anti-infectives (From admission, onward)    Start     Dose/Rate Route Frequency Ordered Stop   May 29, 2022 0800   ceFEPIme (MAXIPIME) 2 g in sodium chloride 0.9 % 100 mL IVPB        2 g 200 mL/hr over 30 Minutes Intravenous Every 12 hours 06/06/2022 2302     06/01/2022 2315  metroNIDAZOLE (FLAGYL) IVPB 500 mg        500 mg 100 mL/hr over 60 Minutes Intravenous Every 12 hours 05/17/2022 2302     05/17/2022 2315  fluconazole (DIFLUCAN) IVPB 400 mg  Status:  Discontinued        400 mg 100 mL/hr over 120 Minutes Intravenous Every 24 hours 05/18/2022 2302 06/05/2022 2305   06/05/2022 2315  fluconazole (DIFLUCAN) IVPB 200 mg        200 mg 100 mL/hr over 60 Minutes Intravenous Every 24 hours 05/19/2022 2305     05/12/22 2030  vancomycin (VANCOREADY) IVPB 1750 mg/350 mL        1,750  mg 175 mL/hr over 120 Minutes Intravenous NOW 06/03/2022 2022 05/17/2022 2302   06/03/2022 2026  vancomycin variable dose per unstable renal function (pharmacist dosing)         Does not apply See admin instructions 05/25/2022 2027     05/26/2022 2015  ceFEPIme (MAXIPIME) 2 g in sodium chloride 0.9 % 100 mL IVPB        2 g 200 mL/hr over 30 Minutes Intravenous  Once 06/05/2022 2007 05/14/2022 2103        Assessment/Plan  Small volume pneumoperitoneum - severe pancytopenia and no clear source of pneumoperitoneum on CT, etiology remains unclear - on 3 pressors this AM - Do not think he would survive an operation right now - continue bowel rest and abx - overall prognosis very poor, discussed with CCM attending, we are available as needed    FEN: NPO, OGT VTE: SCDs, ok to have SQH or LMWH from surgery standpoint ID: [id number]  - per CCM -  Severe septic shock Lactic acidosis  AKI on CKD stage III Severe pancytopenia  Acute hypoxic respiratory failure  RLL squamous cell lung cancer stage IIB on active chemo Charcot-Marie-Tooth AAA Hx of HTN Hx of TIA  LOS: 1 day   I reviewed last 24 h vitals and pain scores, last 48 h intake and output, last 24 h labs and trends, last 24 h imaging results, and critical care notes  .   Norm Parcel, [facility] 2022/05/16, 8:48 AM Please see Amion for pager number during day hours 7:00am-4:30pm

## 2022-06-11 NOTE — Progress Notes (Incomplete)
eLink Physician-Brief Progress Note Patient Name: Juan Yang DOB: [birth year] MRN: [id number]   Date of Service  06-03-22  HPI/Events of Note  Patient now in ICU. Has come up on vasopressin and 25 mic/min of levophed. BP is better but RN notes that he came up on 15 liter o2 and had o2 sats mid 80s. Switched to NRB mask and this improved to 99. Rn also notes that ED RN said he had some garbled speech. This has improved but patient says he is a little loopy after he was given the pain medication. Patient   eICU Interventions       Intervention Category Major Interventions: Shock - evaluation and management;Respiratory failure - evaluation and management Evaluation Type: New Patient Evaluation  Aja Whitehair G Shelvie Salsberry Jun 03, 2022, 12:00 AM

## 2022-06-11 NOTE — Progress Notes (Addendum)
eLink Physician-Brief Progress Note Patient Name: Juan Yang DOB: [birth year] MRN: [id number]   Date of Service  05-31-22  HPI/Events of Note  Patient now in ICU. Has come up on vasopressin and 25 mic/min of levophed. BP is better but RN notes that he came up on 15 liter o2 and had o2 sats mid 80s. Switched to NRB mask and this improved to 99. Rn also notes that ED RN said he had some garbled speech. This has improved but patient himself takes off his mask and says he is a little loopy after he was given the pain medication. Patient has some belly pain and otherwise does not have any focal neuro issues, pupils are ok. ABG done and bicarb is to be started. Surgery note reviewed  eICU Interventions  Get CXR Getting platelets as well now, and albumin RN says lungs otherwise do not sound congested Will lower the dose of dilaudid since he got confused with 2 mg dose Check glucose      Intervention Category Major Interventions: Shock - evaluation and management;Respiratory failure - evaluation and management Evaluation Type: New Patient Evaluation  Juan Yang Juan Yang 05-31-2022, 12:00 AM  Addendum at 2:50 am - Had seen CXR earlier. Seen on camera again. O2 sat 96 on 5 liter nasal o2 and currently asleep in no distress. Weaning pressor needs - levo down to 21 mic and on vaso. MAP in 70s. HR 110. LA improving. Getting his 2nd unit of platelets now. Neutropenic precautions requested. D/w RN.   Addendum at 3:45 am - Bladder scan around 350 cc. Straight cath x 1  Addendum at 4:30 am - RN called to let us know patient is a little more short of breath. Seen on camera. Does seem tachypneic. On salter nasal o2 at 6 liter, o2 sat is 93. Levophed at 20 mic and same dose vaso as last assessment. Completing his 2nd unit of platelets. No change in lung sounds. Does have some nausea. Says he is anxious. Was taking Compazine at home for nausea. No belly pain and no change in belly exam. Will try a dose of  Compazine IV which should help him with anxiety as well. Will also get a blood gas. D.w RN and with patient. Repeat LA pending.   Addendum at 5:30 am - ABG noted. Likely mixed as o2 sat has not been in 70s at any point. RT had increased the o2 to 15 liter via nasal o2. Seen on camera. Looks better than before and RN agrees as well. Not having pain and so dilaudid has not been given but does have some anxiety. Resp failure also likely from acidosis, belly issues, some splinting as well. No vomiting but does have nausea. Avoiding BIPAP due to that. Will try HFNC to help with some work of breathing and will also use very low dose PRN ativan. D/w RN as well as RT. Pressor needs actually coming down further.   Addendum at 6:57 am - Repeat LA pending this AM. Looks better on HFNC.

## 2022-06-11 DEATH — deceased

## 2022-06-12 ENCOUNTER — Ambulatory Visit: Payer: PPO | Admitting: Hematology

## 2022-06-12 ENCOUNTER — Other Ambulatory Visit: Payer: PPO

## 2022-06-12 ENCOUNTER — Ambulatory Visit: Payer: PPO

## 2022-06-19 ENCOUNTER — Other Ambulatory Visit: Payer: PPO

## 2022-06-19 ENCOUNTER — Ambulatory Visit: Payer: PPO

## 2022-07-24 ENCOUNTER — Ambulatory Visit: Payer: Medicare Other | Admitting: Internal Medicine

## 2022-09-09 ENCOUNTER — Ambulatory Visit: Payer: Medicare Other | Admitting: Rheumatology
